# Patient Record
Sex: Female | Born: 1945 | Race: White | Hispanic: No | Smoking: Former smoker
Health system: Southern US, Community
[De-identification: ages and names within clinical notes are randomized; demographics above are authoritative.]

## PROBLEM LIST (undated history)

## (undated) DIAGNOSIS — I480 Paroxysmal atrial fibrillation: Secondary | ICD-10-CM

## (undated) DIAGNOSIS — E039 Hypothyroidism, unspecified: Secondary | ICD-10-CM

## (undated) DIAGNOSIS — F5104 Psychophysiologic insomnia: Secondary | ICD-10-CM

## (undated) DIAGNOSIS — J45909 Unspecified asthma, uncomplicated: Secondary | ICD-10-CM

## (undated) DIAGNOSIS — J189 Pneumonia, unspecified organism: Secondary | ICD-10-CM

## (undated) DIAGNOSIS — J449 Chronic obstructive pulmonary disease, unspecified: Secondary | ICD-10-CM

## (undated) DIAGNOSIS — S139XXA Sprain of joints and ligaments of unspecified parts of neck, initial encounter: Secondary | ICD-10-CM

## (undated) DIAGNOSIS — M51369 Other intervertebral disc degeneration, lumbar region without mention of lumbar back pain or lower extremity pain: Secondary | ICD-10-CM

## (undated) DIAGNOSIS — M5136 Other intervertebral disc degeneration, lumbar region: Secondary | ICD-10-CM

## (undated) DIAGNOSIS — R413 Other amnesia: Secondary | ICD-10-CM

## (undated) DIAGNOSIS — K219 Gastro-esophageal reflux disease without esophagitis: Secondary | ICD-10-CM

## (undated) DIAGNOSIS — F40240 Claustrophobia: Secondary | ICD-10-CM

## (undated) DIAGNOSIS — I2510 Atherosclerotic heart disease of native coronary artery without angina pectoris: Secondary | ICD-10-CM

## (undated) DIAGNOSIS — E119 Type 2 diabetes mellitus without complications: Secondary | ICD-10-CM

## (undated) DIAGNOSIS — M5126 Other intervertebral disc displacement, lumbar region: Secondary | ICD-10-CM

## (undated) DIAGNOSIS — R51 Headache: Secondary | ICD-10-CM

## (undated) DIAGNOSIS — G8929 Other chronic pain: Secondary | ICD-10-CM

## (undated) DIAGNOSIS — M316 Other giant cell arteritis: Secondary | ICD-10-CM

## (undated) DIAGNOSIS — I10 Essential (primary) hypertension: Secondary | ICD-10-CM

## (undated) DIAGNOSIS — K589 Irritable bowel syndrome without diarrhea: Secondary | ICD-10-CM

## (undated) DIAGNOSIS — H5462 Unqualified visual loss, left eye, normal vision right eye: Secondary | ICD-10-CM

## (undated) DIAGNOSIS — I214 Non-ST elevation (NSTEMI) myocardial infarction: Secondary | ICD-10-CM

## (undated) DIAGNOSIS — N182 Chronic kidney disease, stage 2 (mild): Secondary | ICD-10-CM

## (undated) DIAGNOSIS — R609 Edema, unspecified: Secondary | ICD-10-CM

## (undated) DIAGNOSIS — M1990 Unspecified osteoarthritis, unspecified site: Secondary | ICD-10-CM

## (undated) DIAGNOSIS — E785 Hyperlipidemia, unspecified: Secondary | ICD-10-CM

## (undated) HISTORY — PX: CHOLECYSTECTOMY: SHX55

## (undated) HISTORY — PX: ABDOMINAL HYSTERECTOMY: SHX81

## (undated) HISTORY — DX: Irritable bowel syndrome, unspecified: K58.9

## (undated) HISTORY — PX: APPENDECTOMY: SHX54

## (undated) HISTORY — DX: Unqualified visual loss, left eye, normal vision right eye: H54.62

## (undated) HISTORY — DX: Other intervertebral disc degeneration, lumbar region: M51.36

## (undated) HISTORY — DX: Non-ST elevation (NSTEMI) myocardial infarction: I21.4

## (undated) HISTORY — DX: Other amnesia: R41.3

## (undated) HISTORY — DX: Psychophysiologic insomnia: F51.04

## (undated) HISTORY — DX: Atherosclerotic heart disease of native coronary artery without angina pectoris: I25.10

## (undated) HISTORY — DX: Edema, unspecified: R60.9

## (undated) HISTORY — DX: Essential (primary) hypertension: I10

## (undated) HISTORY — DX: Chronic kidney disease, stage 2 (mild): N18.2

## (undated) HISTORY — DX: Type 2 diabetes mellitus without complications: E11.9

## (undated) HISTORY — PX: FOOT SURGERY: SHX648

## (undated) HISTORY — DX: Other giant cell arteritis: M31.6

## (undated) HISTORY — DX: Gastro-esophageal reflux disease without esophagitis: K21.9

## (undated) HISTORY — DX: Hypothyroidism, unspecified: E03.9

## (undated) HISTORY — DX: Hyperlipidemia, unspecified: E78.5

## (undated) HISTORY — DX: Headache: R51

## (undated) HISTORY — DX: Other intervertebral disc displacement, lumbar region: M51.26

## (undated) HISTORY — DX: Pneumonia, unspecified organism: J18.9

## (undated) HISTORY — DX: Other chronic pain: G89.29

## (undated) HISTORY — DX: Paroxysmal atrial fibrillation: I48.0

## (undated) HISTORY — DX: Other intervertebral disc degeneration, lumbar region without mention of lumbar back pain or lower extremity pain: M51.369

## (undated) HISTORY — DX: Sprain of joints and ligaments of unspecified parts of neck, initial encounter: S13.9XXA

---

## 2000-12-08 ENCOUNTER — Encounter: Payer: Self-pay | Admitting: Family Medicine

## 2000-12-08 ENCOUNTER — Ambulatory Visit (HOSPITAL_COMMUNITY): Admission: RE | Admit: 2000-12-08 | Discharge: 2000-12-08 | Payer: Self-pay | Admitting: Family Medicine

## 2001-05-03 ENCOUNTER — Encounter (HOSPITAL_COMMUNITY): Admission: RE | Admit: 2001-05-03 | Discharge: 2001-06-02 | Payer: Self-pay | Admitting: Oncology

## 2001-05-03 ENCOUNTER — Encounter: Admission: RE | Admit: 2001-05-03 | Discharge: 2001-05-03 | Payer: Self-pay | Admitting: Oncology

## 2001-05-28 ENCOUNTER — Ambulatory Visit (HOSPITAL_COMMUNITY): Admission: RE | Admit: 2001-05-28 | Discharge: 2001-05-28 | Payer: Self-pay | Admitting: Family Medicine

## 2001-05-28 ENCOUNTER — Encounter: Payer: Self-pay | Admitting: Family Medicine

## 2001-06-14 ENCOUNTER — Encounter: Admission: RE | Admit: 2001-06-14 | Discharge: 2001-06-14 | Payer: Self-pay | Admitting: Oncology

## 2001-06-14 ENCOUNTER — Encounter (HOSPITAL_COMMUNITY): Admission: RE | Admit: 2001-06-14 | Discharge: 2001-07-14 | Payer: Self-pay | Admitting: Oncology

## 2001-07-02 ENCOUNTER — Ambulatory Visit (HOSPITAL_COMMUNITY): Admission: RE | Admit: 2001-07-02 | Discharge: 2001-07-02 | Payer: Self-pay | Admitting: Family Medicine

## 2001-07-02 ENCOUNTER — Encounter: Payer: Self-pay | Admitting: Family Medicine

## 2001-07-29 ENCOUNTER — Encounter (HOSPITAL_COMMUNITY): Admission: RE | Admit: 2001-07-29 | Discharge: 2001-08-28 | Payer: Self-pay | Admitting: Oncology

## 2001-08-27 ENCOUNTER — Encounter (HOSPITAL_COMMUNITY): Admission: RE | Admit: 2001-08-27 | Discharge: 2001-09-26 | Payer: Self-pay | Admitting: Oncology

## 2001-09-17 ENCOUNTER — Encounter (INDEPENDENT_AMBULATORY_CARE_PROVIDER_SITE_OTHER): Payer: Self-pay | Admitting: Internal Medicine

## 2001-10-22 ENCOUNTER — Encounter (HOSPITAL_COMMUNITY): Admission: RE | Admit: 2001-10-22 | Discharge: 2001-11-21 | Payer: Self-pay | Admitting: Oncology

## 2001-12-17 ENCOUNTER — Encounter: Admission: RE | Admit: 2001-12-17 | Discharge: 2001-12-17 | Payer: Self-pay | Admitting: Oncology

## 2001-12-17 ENCOUNTER — Encounter (HOSPITAL_COMMUNITY): Admission: RE | Admit: 2001-12-17 | Discharge: 2002-01-16 | Payer: Self-pay | Admitting: Oncology

## 2002-02-07 ENCOUNTER — Encounter: Admission: RE | Admit: 2002-02-07 | Discharge: 2002-02-07 | Payer: Self-pay | Admitting: Oncology

## 2002-02-07 ENCOUNTER — Encounter (HOSPITAL_COMMUNITY): Admission: RE | Admit: 2002-02-07 | Discharge: 2002-03-09 | Payer: Self-pay | Admitting: Oncology

## 2002-03-11 ENCOUNTER — Encounter (HOSPITAL_COMMUNITY): Admission: RE | Admit: 2002-03-11 | Discharge: 2002-04-10 | Payer: Self-pay | Admitting: Oncology

## 2002-03-11 ENCOUNTER — Encounter: Admission: RE | Admit: 2002-03-11 | Discharge: 2002-03-11 | Payer: Self-pay | Admitting: Oncology

## 2002-06-12 ENCOUNTER — Encounter (HOSPITAL_COMMUNITY): Admission: RE | Admit: 2002-06-12 | Discharge: 2002-07-12 | Payer: Self-pay | Admitting: Oncology

## 2002-06-12 ENCOUNTER — Encounter: Admission: RE | Admit: 2002-06-12 | Discharge: 2002-06-12 | Payer: Self-pay | Admitting: Oncology

## 2002-07-16 ENCOUNTER — Encounter: Payer: Self-pay | Admitting: Family Medicine

## 2002-07-16 ENCOUNTER — Ambulatory Visit (HOSPITAL_COMMUNITY): Admission: RE | Admit: 2002-07-16 | Discharge: 2002-07-16 | Payer: Self-pay | Admitting: Family Medicine

## 2002-08-07 ENCOUNTER — Encounter (HOSPITAL_COMMUNITY): Admission: RE | Admit: 2002-08-07 | Discharge: 2002-09-06 | Payer: Self-pay | Admitting: Oncology

## 2002-08-07 ENCOUNTER — Encounter: Admission: RE | Admit: 2002-08-07 | Discharge: 2002-08-07 | Payer: Self-pay | Admitting: Oncology

## 2002-09-02 ENCOUNTER — Encounter (INDEPENDENT_AMBULATORY_CARE_PROVIDER_SITE_OTHER): Payer: Self-pay | Admitting: Internal Medicine

## 2002-09-02 ENCOUNTER — Ambulatory Visit (HOSPITAL_COMMUNITY): Admission: RE | Admit: 2002-09-02 | Discharge: 2002-09-02 | Payer: Self-pay | Admitting: Internal Medicine

## 2002-10-01 ENCOUNTER — Encounter (HOSPITAL_COMMUNITY): Admission: RE | Admit: 2002-10-01 | Discharge: 2002-10-31 | Payer: Self-pay | Admitting: Oncology

## 2002-10-01 ENCOUNTER — Encounter: Admission: RE | Admit: 2002-10-01 | Discharge: 2002-10-01 | Payer: Self-pay | Admitting: Oncology

## 2002-11-24 ENCOUNTER — Encounter: Payer: Self-pay | Admitting: Family Medicine

## 2002-11-24 ENCOUNTER — Ambulatory Visit (HOSPITAL_COMMUNITY): Admission: RE | Admit: 2002-11-24 | Discharge: 2002-11-24 | Payer: Self-pay | Admitting: Family Medicine

## 2002-12-18 ENCOUNTER — Encounter: Admission: RE | Admit: 2002-12-18 | Discharge: 2002-12-18 | Payer: Self-pay | Admitting: Oncology

## 2002-12-18 ENCOUNTER — Encounter (HOSPITAL_COMMUNITY): Admission: RE | Admit: 2002-12-18 | Discharge: 2003-01-17 | Payer: Self-pay | Admitting: Oncology

## 2003-03-11 ENCOUNTER — Encounter: Admission: RE | Admit: 2003-03-11 | Discharge: 2003-03-11 | Payer: Self-pay | Admitting: Oncology

## 2003-03-11 ENCOUNTER — Encounter (HOSPITAL_COMMUNITY): Admission: RE | Admit: 2003-03-11 | Discharge: 2003-04-10 | Payer: Self-pay | Admitting: Oncology

## 2003-04-23 ENCOUNTER — Encounter: Admission: RE | Admit: 2003-04-23 | Discharge: 2003-04-23 | Payer: Self-pay | Admitting: Orthopedic Surgery

## 2003-04-23 ENCOUNTER — Encounter: Payer: Self-pay | Admitting: Orthopedic Surgery

## 2003-06-24 ENCOUNTER — Encounter (HOSPITAL_COMMUNITY): Admission: RE | Admit: 2003-06-24 | Discharge: 2003-07-24 | Payer: Self-pay | Admitting: Oncology

## 2003-06-24 ENCOUNTER — Encounter: Admission: RE | Admit: 2003-06-24 | Discharge: 2003-06-24 | Payer: Self-pay | Admitting: Oncology

## 2003-09-18 ENCOUNTER — Ambulatory Visit (HOSPITAL_COMMUNITY): Admission: RE | Admit: 2003-09-18 | Discharge: 2003-09-18 | Payer: Self-pay | Admitting: Family Medicine

## 2003-10-09 ENCOUNTER — Ambulatory Visit (HOSPITAL_COMMUNITY): Admission: RE | Admit: 2003-10-09 | Discharge: 2003-10-09 | Payer: Self-pay | Admitting: Internal Medicine

## 2004-01-13 ENCOUNTER — Encounter (HOSPITAL_COMMUNITY): Admission: RE | Admit: 2004-01-13 | Discharge: 2004-02-12 | Payer: Self-pay | Admitting: Oncology

## 2004-01-13 ENCOUNTER — Encounter: Admission: RE | Admit: 2004-01-13 | Discharge: 2004-01-13 | Payer: Self-pay | Admitting: Oncology

## 2004-04-01 ENCOUNTER — Encounter: Admission: RE | Admit: 2004-04-01 | Discharge: 2004-04-22 | Payer: Self-pay | Admitting: Oncology

## 2004-04-01 ENCOUNTER — Encounter (HOSPITAL_COMMUNITY): Admission: RE | Admit: 2004-04-01 | Discharge: 2004-04-22 | Payer: Self-pay | Admitting: Oncology

## 2004-04-16 ENCOUNTER — Emergency Department (HOSPITAL_COMMUNITY): Admission: EM | Admit: 2004-04-16 | Discharge: 2004-04-16 | Payer: Self-pay | Admitting: Emergency Medicine

## 2004-04-29 ENCOUNTER — Emergency Department (HOSPITAL_COMMUNITY): Admission: EM | Admit: 2004-04-29 | Discharge: 2004-04-29 | Payer: Self-pay | Admitting: Emergency Medicine

## 2004-05-02 ENCOUNTER — Ambulatory Visit (HOSPITAL_COMMUNITY): Admission: RE | Admit: 2004-05-02 | Discharge: 2004-05-02 | Payer: Self-pay | Admitting: Family Medicine

## 2004-05-10 ENCOUNTER — Encounter: Admission: RE | Admit: 2004-05-10 | Discharge: 2004-05-10 | Payer: Self-pay | Admitting: Oncology

## 2004-06-24 ENCOUNTER — Ambulatory Visit (HOSPITAL_COMMUNITY): Admission: RE | Admit: 2004-06-24 | Discharge: 2004-06-24 | Payer: Self-pay | Admitting: Family Medicine

## 2004-07-19 ENCOUNTER — Encounter (HOSPITAL_COMMUNITY): Admission: RE | Admit: 2004-07-19 | Discharge: 2004-08-18 | Payer: Self-pay | Admitting: Oncology

## 2004-07-19 ENCOUNTER — Encounter: Admission: RE | Admit: 2004-07-19 | Discharge: 2004-07-19 | Payer: Self-pay | Admitting: Oncology

## 2004-07-26 ENCOUNTER — Ambulatory Visit (HOSPITAL_COMMUNITY): Payer: Self-pay | Admitting: Oncology

## 2004-09-13 ENCOUNTER — Encounter: Admission: RE | Admit: 2004-09-13 | Discharge: 2004-09-13 | Payer: Self-pay | Admitting: Oncology

## 2004-09-13 ENCOUNTER — Encounter (HOSPITAL_COMMUNITY): Admission: RE | Admit: 2004-09-13 | Discharge: 2004-10-13 | Payer: Self-pay | Admitting: Oncology

## 2004-10-03 ENCOUNTER — Ambulatory Visit (HOSPITAL_COMMUNITY): Admission: RE | Admit: 2004-10-03 | Discharge: 2004-10-03 | Payer: Self-pay | Admitting: Family Medicine

## 2004-10-07 ENCOUNTER — Ambulatory Visit (HOSPITAL_COMMUNITY): Admission: RE | Admit: 2004-10-07 | Discharge: 2004-10-07 | Payer: Self-pay | Admitting: Family Medicine

## 2004-10-25 ENCOUNTER — Ambulatory Visit (HOSPITAL_COMMUNITY): Admission: RE | Admit: 2004-10-25 | Discharge: 2004-10-25 | Payer: Self-pay | Admitting: Pulmonary Disease

## 2004-11-08 ENCOUNTER — Ambulatory Visit (HOSPITAL_COMMUNITY): Payer: Self-pay | Admitting: Oncology

## 2004-11-08 ENCOUNTER — Encounter: Admission: RE | Admit: 2004-11-08 | Discharge: 2004-11-08 | Payer: Self-pay | Admitting: Oncology

## 2004-11-08 ENCOUNTER — Encounter (HOSPITAL_COMMUNITY): Admission: RE | Admit: 2004-11-08 | Discharge: 2004-12-08 | Payer: Self-pay | Admitting: Oncology

## 2004-11-15 ENCOUNTER — Ambulatory Visit (HOSPITAL_COMMUNITY): Admission: RE | Admit: 2004-11-15 | Discharge: 2004-11-15 | Payer: Self-pay | Admitting: Pulmonary Disease

## 2004-12-14 ENCOUNTER — Ambulatory Visit: Payer: Self-pay | Admitting: Cardiology

## 2004-12-14 ENCOUNTER — Ambulatory Visit (HOSPITAL_COMMUNITY): Admission: RE | Admit: 2004-12-14 | Discharge: 2004-12-14 | Payer: Self-pay | Admitting: Pulmonary Disease

## 2004-12-26 ENCOUNTER — Encounter: Admission: RE | Admit: 2004-12-26 | Discharge: 2004-12-26 | Payer: Self-pay | Admitting: Pulmonary Disease

## 2005-02-15 ENCOUNTER — Encounter: Admission: RE | Admit: 2005-02-15 | Discharge: 2005-02-15 | Payer: Self-pay | Admitting: Oncology

## 2005-02-15 ENCOUNTER — Ambulatory Visit (HOSPITAL_COMMUNITY): Payer: Self-pay | Admitting: Oncology

## 2005-02-15 ENCOUNTER — Encounter (HOSPITAL_COMMUNITY): Admission: RE | Admit: 2005-02-15 | Discharge: 2005-03-17 | Payer: Self-pay | Admitting: Oncology

## 2005-04-04 ENCOUNTER — Ambulatory Visit: Payer: Self-pay | Admitting: Internal Medicine

## 2005-04-07 ENCOUNTER — Ambulatory Visit (HOSPITAL_COMMUNITY): Admission: RE | Admit: 2005-04-07 | Discharge: 2005-04-07 | Payer: Self-pay | Admitting: Internal Medicine

## 2005-05-23 ENCOUNTER — Ambulatory Visit (HOSPITAL_COMMUNITY): Admission: RE | Admit: 2005-05-23 | Discharge: 2005-05-23 | Payer: Self-pay | Admitting: Unknown Physician Specialty

## 2005-05-23 ENCOUNTER — Ambulatory Visit: Payer: Self-pay | Admitting: Internal Medicine

## 2005-06-21 ENCOUNTER — Encounter: Admission: RE | Admit: 2005-06-21 | Discharge: 2005-06-21 | Payer: Self-pay | Admitting: Pulmonary Disease

## 2005-10-27 ENCOUNTER — Encounter: Admission: RE | Admit: 2005-10-27 | Discharge: 2005-10-27 | Payer: Self-pay | Admitting: Family Medicine

## 2005-11-15 ENCOUNTER — Encounter
Admission: RE | Admit: 2005-11-15 | Discharge: 2005-11-15 | Payer: Self-pay | Admitting: Physical Medicine and Rehabilitation

## 2005-12-06 ENCOUNTER — Ambulatory Visit: Payer: Self-pay | Admitting: Internal Medicine

## 2005-12-14 ENCOUNTER — Ambulatory Visit (HOSPITAL_COMMUNITY): Admission: RE | Admit: 2005-12-14 | Discharge: 2005-12-14 | Payer: Self-pay | Admitting: Internal Medicine

## 2005-12-14 ENCOUNTER — Ambulatory Visit: Payer: Self-pay | Admitting: Internal Medicine

## 2006-01-09 ENCOUNTER — Ambulatory Visit: Payer: Self-pay | Admitting: Internal Medicine

## 2006-01-11 ENCOUNTER — Ambulatory Visit (HOSPITAL_COMMUNITY): Admission: RE | Admit: 2006-01-11 | Discharge: 2006-01-11 | Payer: Self-pay | Admitting: Internal Medicine

## 2006-03-13 ENCOUNTER — Ambulatory Visit: Payer: Self-pay | Admitting: Internal Medicine

## 2006-05-17 ENCOUNTER — Ambulatory Visit: Payer: Self-pay | Admitting: Internal Medicine

## 2006-05-18 ENCOUNTER — Encounter: Admission: RE | Admit: 2006-05-18 | Discharge: 2006-05-18 | Payer: Self-pay | Admitting: Neurology

## 2006-05-31 ENCOUNTER — Encounter: Admission: RE | Admit: 2006-05-31 | Discharge: 2006-05-31 | Payer: Self-pay | Admitting: Pulmonary Disease

## 2006-07-23 ENCOUNTER — Ambulatory Visit (HOSPITAL_COMMUNITY): Admission: RE | Admit: 2006-07-23 | Discharge: 2006-07-23 | Payer: Self-pay | Admitting: Family Medicine

## 2006-08-10 ENCOUNTER — Ambulatory Visit: Payer: Self-pay | Admitting: Internal Medicine

## 2006-08-10 ENCOUNTER — Ambulatory Visit (HOSPITAL_COMMUNITY): Admission: RE | Admit: 2006-08-10 | Discharge: 2006-08-10 | Payer: Self-pay | Admitting: Family Medicine

## 2006-09-06 ENCOUNTER — Ambulatory Visit: Payer: Self-pay | Admitting: Internal Medicine

## 2006-12-06 ENCOUNTER — Ambulatory Visit: Payer: Self-pay | Admitting: Internal Medicine

## 2007-03-07 ENCOUNTER — Encounter
Admission: RE | Admit: 2007-03-07 | Discharge: 2007-03-07 | Payer: Self-pay | Admitting: Physical Medicine and Rehabilitation

## 2007-03-12 ENCOUNTER — Ambulatory Visit: Payer: Self-pay | Admitting: Cardiovascular Disease

## 2007-03-13 ENCOUNTER — Ambulatory Visit: Payer: Self-pay | Admitting: Cardiovascular Disease

## 2007-03-13 ENCOUNTER — Ambulatory Visit (HOSPITAL_COMMUNITY): Admission: RE | Admit: 2007-03-13 | Discharge: 2007-03-13 | Payer: Self-pay | Admitting: Cardiovascular Disease

## 2007-03-26 ENCOUNTER — Ambulatory Visit: Payer: Self-pay | Admitting: Cardiovascular Disease

## 2007-04-04 ENCOUNTER — Ambulatory Visit: Payer: Self-pay | Admitting: Cardiovascular Disease

## 2007-05-09 ENCOUNTER — Ambulatory Visit: Payer: Self-pay | Admitting: Cardiovascular Disease

## 2007-05-09 ENCOUNTER — Ambulatory Visit (HOSPITAL_COMMUNITY): Admission: RE | Admit: 2007-05-09 | Discharge: 2007-05-09 | Payer: Self-pay | Admitting: Cardiovascular Disease

## 2007-05-16 ENCOUNTER — Ambulatory Visit: Payer: Self-pay | Admitting: Cardiovascular Disease

## 2007-05-16 ENCOUNTER — Ambulatory Visit (HOSPITAL_COMMUNITY): Admission: RE | Admit: 2007-05-16 | Discharge: 2007-05-16 | Payer: Self-pay | Admitting: Cardiovascular Disease

## 2007-07-03 ENCOUNTER — Ambulatory Visit: Payer: Self-pay | Admitting: Cardiovascular Disease

## 2007-08-01 ENCOUNTER — Ambulatory Visit (HOSPITAL_COMMUNITY): Admission: RE | Admit: 2007-08-01 | Discharge: 2007-08-01 | Payer: Self-pay | Admitting: Family Medicine

## 2007-09-30 ENCOUNTER — Ambulatory Visit (HOSPITAL_COMMUNITY): Admission: RE | Admit: 2007-09-30 | Discharge: 2007-09-30 | Payer: Self-pay | Admitting: Family Medicine

## 2007-12-19 ENCOUNTER — Emergency Department (HOSPITAL_COMMUNITY): Admission: EM | Admit: 2007-12-19 | Discharge: 2007-12-19 | Payer: Self-pay | Admitting: Emergency Medicine

## 2007-12-20 ENCOUNTER — Inpatient Hospital Stay (HOSPITAL_COMMUNITY): Admission: AD | Admit: 2007-12-20 | Discharge: 2007-12-22 | Payer: Self-pay | Admitting: Family Medicine

## 2008-01-02 ENCOUNTER — Ambulatory Visit: Payer: Self-pay | Admitting: Cardiovascular Disease

## 2008-04-22 ENCOUNTER — Ambulatory Visit (HOSPITAL_COMMUNITY): Admission: RE | Admit: 2008-04-22 | Discharge: 2008-04-22 | Payer: Self-pay | Admitting: Pulmonary Disease

## 2008-06-30 ENCOUNTER — Ambulatory Visit (HOSPITAL_COMMUNITY): Admission: RE | Admit: 2008-06-30 | Discharge: 2008-06-30 | Payer: Self-pay | Admitting: Family Medicine

## 2008-09-08 ENCOUNTER — Ambulatory Visit: Payer: Self-pay | Admitting: Cardiology

## 2009-02-09 ENCOUNTER — Ambulatory Visit (HOSPITAL_COMMUNITY): Admission: RE | Admit: 2009-02-09 | Discharge: 2009-02-09 | Payer: Self-pay | Admitting: Internal Medicine

## 2009-04-29 ENCOUNTER — Ambulatory Visit: Payer: Self-pay | Admitting: Cardiovascular Disease

## 2009-04-29 ENCOUNTER — Inpatient Hospital Stay (HOSPITAL_COMMUNITY): Admission: AD | Admit: 2009-04-29 | Discharge: 2009-05-04 | Payer: Self-pay | Admitting: Family Medicine

## 2009-04-29 ENCOUNTER — Ambulatory Visit (HOSPITAL_COMMUNITY): Admission: RE | Admit: 2009-04-29 | Discharge: 2009-04-29 | Payer: Self-pay | Admitting: Family Medicine

## 2009-05-10 ENCOUNTER — Ambulatory Visit (HOSPITAL_COMMUNITY): Admission: RE | Admit: 2009-05-10 | Discharge: 2009-05-10 | Payer: Self-pay | Admitting: Family Medicine

## 2009-08-06 ENCOUNTER — Ambulatory Visit (HOSPITAL_COMMUNITY): Admission: RE | Admit: 2009-08-06 | Discharge: 2009-08-06 | Payer: Self-pay | Admitting: Family Medicine

## 2010-06-08 ENCOUNTER — Ambulatory Visit (HOSPITAL_COMMUNITY): Admission: RE | Admit: 2010-06-08 | Discharge: 2010-06-08 | Payer: Self-pay | Admitting: Family Medicine

## 2010-06-14 ENCOUNTER — Ambulatory Visit (HOSPITAL_COMMUNITY)
Admission: RE | Admit: 2010-06-14 | Discharge: 2010-06-14 | Payer: Self-pay | Source: Home / Self Care | Admitting: Family Medicine

## 2010-08-13 ENCOUNTER — Encounter: Payer: Self-pay | Admitting: Family Medicine

## 2010-08-13 ENCOUNTER — Encounter: Payer: Self-pay | Admitting: Internal Medicine

## 2010-08-14 ENCOUNTER — Encounter: Payer: Self-pay | Admitting: Family Medicine

## 2010-08-15 ENCOUNTER — Ambulatory Visit (HOSPITAL_COMMUNITY)
Admission: RE | Admit: 2010-08-15 | Discharge: 2010-08-15 | Payer: Self-pay | Source: Home / Self Care | Attending: Family Medicine | Admitting: Family Medicine

## 2010-10-08 ENCOUNTER — Emergency Department (HOSPITAL_COMMUNITY): Payer: Medicaid Other

## 2010-10-08 ENCOUNTER — Emergency Department (HOSPITAL_COMMUNITY)
Admission: EM | Admit: 2010-10-08 | Discharge: 2010-10-08 | Disposition: A | Discharge status: Home / Self Care | Payer: Medicaid Other | Attending: Emergency Medicine | Admitting: Emergency Medicine

## 2010-10-08 DIAGNOSIS — S0083XA Contusion of other part of head, initial encounter: Secondary | ICD-10-CM | POA: Insufficient documentation

## 2010-10-08 DIAGNOSIS — J4489 Other specified chronic obstructive pulmonary disease: Secondary | ICD-10-CM | POA: Insufficient documentation

## 2010-10-08 DIAGNOSIS — Y92009 Unspecified place in unspecified non-institutional (private) residence as the place of occurrence of the external cause: Secondary | ICD-10-CM | POA: Insufficient documentation

## 2010-10-08 DIAGNOSIS — IMO0002 Reserved for concepts with insufficient information to code with codable children: Secondary | ICD-10-CM | POA: Insufficient documentation

## 2010-10-08 DIAGNOSIS — S0003XA Contusion of scalp, initial encounter: Secondary | ICD-10-CM | POA: Insufficient documentation

## 2010-10-08 DIAGNOSIS — I10 Essential (primary) hypertension: Secondary | ICD-10-CM | POA: Insufficient documentation

## 2010-10-08 DIAGNOSIS — I509 Heart failure, unspecified: Secondary | ICD-10-CM | POA: Insufficient documentation

## 2010-10-08 DIAGNOSIS — W1809XA Striking against other object with subsequent fall, initial encounter: Secondary | ICD-10-CM | POA: Insufficient documentation

## 2010-10-08 DIAGNOSIS — S022XXA Fracture of nasal bones, initial encounter for closed fracture: Secondary | ICD-10-CM | POA: Insufficient documentation

## 2010-10-08 DIAGNOSIS — M25569 Pain in unspecified knee: Secondary | ICD-10-CM | POA: Insufficient documentation

## 2010-10-08 DIAGNOSIS — Z79899 Other long term (current) drug therapy: Secondary | ICD-10-CM | POA: Insufficient documentation

## 2010-10-08 DIAGNOSIS — E119 Type 2 diabetes mellitus without complications: Secondary | ICD-10-CM | POA: Insufficient documentation

## 2010-10-08 DIAGNOSIS — J449 Chronic obstructive pulmonary disease, unspecified: Secondary | ICD-10-CM | POA: Insufficient documentation

## 2010-10-08 DIAGNOSIS — J3489 Other specified disorders of nose and nasal sinuses: Secondary | ICD-10-CM | POA: Insufficient documentation

## 2010-10-08 DIAGNOSIS — S8000XA Contusion of unspecified knee, initial encounter: Secondary | ICD-10-CM | POA: Insufficient documentation

## 2010-10-08 DIAGNOSIS — R51 Headache: Secondary | ICD-10-CM | POA: Insufficient documentation

## 2010-10-08 DIAGNOSIS — E039 Hypothyroidism, unspecified: Secondary | ICD-10-CM | POA: Insufficient documentation

## 2010-10-08 DIAGNOSIS — K219 Gastro-esophageal reflux disease without esophagitis: Secondary | ICD-10-CM | POA: Insufficient documentation

## 2010-10-27 LAB — CBC
Hemoglobin: 11 g/dL — ABNORMAL LOW (ref 12.0–15.0)
MCHC: 33.6 g/dL (ref 30.0–36.0)
Platelets: 156 10*3/uL (ref 150–400)
RBC: 3.1 MIL/uL — ABNORMAL LOW (ref 3.87–5.11)
RBC: 3.53 MIL/uL — ABNORMAL LOW (ref 3.87–5.11)
WBC: 6 10*3/uL (ref 4.0–10.5)
WBC: 9.6 10*3/uL (ref 4.0–10.5)

## 2010-10-27 LAB — URINALYSIS, MICROSCOPIC ONLY
Bilirubin Urine: NEGATIVE
Ketones, ur: NEGATIVE mg/dL
Nitrite: NEGATIVE
Protein, ur: NEGATIVE mg/dL
Specific Gravity, Urine: <1.005 — ABNORMAL LOW (ref 1.005–1.030)
Urobilinogen, UA: 0.2 mg/dL (ref 0.0–1.0)

## 2010-10-27 LAB — GLUCOSE, CAPILLARY
Glucose-Capillary: 122 mg/dL — ABNORMAL HIGH (ref 70–99)
Glucose-Capillary: 126 mg/dL — ABNORMAL HIGH (ref 70–99)
Glucose-Capillary: 126 mg/dL — ABNORMAL HIGH (ref 70–99)
Glucose-Capillary: 137 mg/dL — ABNORMAL HIGH (ref 70–99)
Glucose-Capillary: 142 mg/dL — ABNORMAL HIGH (ref 70–99)
Glucose-Capillary: 146 mg/dL — ABNORMAL HIGH (ref 70–99)
Glucose-Capillary: 163 mg/dL — ABNORMAL HIGH (ref 70–99)
Glucose-Capillary: 185 mg/dL — ABNORMAL HIGH (ref 70–99)
Glucose-Capillary: 186 mg/dL — ABNORMAL HIGH (ref 70–99)

## 2010-10-27 LAB — BLOOD GAS, ARTERIAL
Bicarbonate: 32.8 mEq/L — ABNORMAL HIGH (ref 20.0–24.0)
Bicarbonate: 33.8 mEq/L — ABNORMAL HIGH (ref 20.0–24.0)
FIO2: 0.21 %
O2 Content: 3 L/min
O2 Saturation: 78 %
Patient temperature: 37
TCO2: 30.7 mmol/L (ref 0–100)
pH, Arterial: 7.357 (ref 7.350–7.400)
pH, Arterial: 7.445 — ABNORMAL HIGH (ref 7.350–7.400)

## 2010-10-27 LAB — DIFFERENTIAL
Basophils Absolute: 0 10*3/uL (ref 0.0–0.1)
Eosinophils Absolute: 0 10*3/uL (ref 0.0–0.7)
Eosinophils Relative: 0 % (ref 0–5)
Eosinophils Relative: 3 % (ref 0–5)
Lymphocytes Relative: 11 % — ABNORMAL LOW (ref 12–46)
Lymphocytes Relative: 30 % (ref 12–46)
Lymphs Abs: 1.1 10*3/uL (ref 0.7–4.0)
Monocytes Absolute: 0.5 10*3/uL (ref 0.1–1.0)

## 2010-10-27 LAB — URINE CULTURE
Colony Count: NO GROWTH
Culture: NO GROWTH
Special Requests: NEGATIVE

## 2010-10-27 LAB — BASIC METABOLIC PANEL
BUN: 10 mg/dL (ref 6–23)
CO2: 34 mEq/L — ABNORMAL HIGH (ref 19–32)
Chloride: 100 mEq/L (ref 96–112)
GFR calc non Af Amer: >60 mL/min (ref >60)
Glucose, Bld: 152 mg/dL — ABNORMAL HIGH (ref 70–99)
Potassium: 4 mEq/L (ref 3.5–5.1)
Sodium: 141 mEq/L (ref 135–145)

## 2010-10-27 LAB — COMPREHENSIVE METABOLIC PANEL
ALT: 18 U/L (ref 0–35)
AST: 14 U/L (ref 0–37)
Albumin: 3 g/dL — ABNORMAL LOW (ref 3.5–5.2)
CO2: 33 mEq/L — ABNORMAL HIGH (ref 19–32)
Calcium: 9 mg/dL (ref 8.4–10.5)
Chloride: 98 mEq/L (ref 96–112)
GFR calc Af Amer: >60 mL/min (ref >60)
GFR calc non Af Amer: >60 mL/min (ref >60)
Sodium: 140 mEq/L (ref 135–145)

## 2010-10-27 LAB — IRON AND TIBC: Iron: 61 ug/dL (ref 42–135)

## 2010-10-27 LAB — BRAIN NATRIURETIC PEPTIDE: Pro B Natriuretic peptide (BNP): <30 pg/mL (ref 0.0–100.0)

## 2010-11-30 ENCOUNTER — Ambulatory Visit (HOSPITAL_COMMUNITY)
Admission: RE | Admit: 2010-11-30 | Discharge: 2010-11-30 | Disposition: A | Discharge status: Home / Self Care | Payer: Medicaid Other | Source: Ambulatory Visit | Attending: Pulmonary Disease | Admitting: Pulmonary Disease

## 2010-11-30 DIAGNOSIS — E119 Type 2 diabetes mellitus without complications: Secondary | ICD-10-CM | POA: Insufficient documentation

## 2010-11-30 DIAGNOSIS — I517 Cardiomegaly: Secondary | ICD-10-CM

## 2010-11-30 DIAGNOSIS — R0989 Other specified symptoms and signs involving the circulatory and respiratory systems: Secondary | ICD-10-CM | POA: Insufficient documentation

## 2010-11-30 DIAGNOSIS — R0609 Other forms of dyspnea: Secondary | ICD-10-CM | POA: Insufficient documentation

## 2010-11-30 DIAGNOSIS — J4489 Other specified chronic obstructive pulmonary disease: Secondary | ICD-10-CM | POA: Insufficient documentation

## 2010-11-30 DIAGNOSIS — J449 Chronic obstructive pulmonary disease, unspecified: Secondary | ICD-10-CM | POA: Insufficient documentation

## 2010-12-06 NOTE — Group Therapy Note (Signed)
NAMEKAMARRI, FISCHETTI              ACCOUNT NO.:  [id number]   MEDICAL RECORD NO.:  [id number]           PATIENT TYPE:  INP   LOCATION:  A318                          FACILITY:  [facility]   PHYSICIAN:  [name], MD   DATE OF BIRTH:  07-23-1946   DATE OF PROCEDURE:  DATE OF DISCHARGE:  12/22/2007                                 PROGRESS NOTE   This patient was admitted to hospital with atrial fibrillation and rapid  ventricular rate.  She is on Cardizem drip and her rate did come down  some and she was transitioned to oral Cardizem, now is in sinus rhythm.  She did have a reaction to Duragesic patch prior to admission, which  caused some discomfort.  She has improved, is in sinus rhythm.   OBJECTIVE:  VITAL SIGNS:  Blood pressure 102/51, respirations 20, pulse  77, temperature 98.2, and blood sugars range from 166-216.  Chemistries  show a slightly low serum potassium of 3.4.  HEART:  Regular rhythm.  LUNGS:  Diminished breath sounds bilaterally.  ABDOMEN:  No palpable  organs or masses.   ASSESSMENT:  The patient admitted with rapid atrial fibrillation.  She  is now in sinus rhythm and is feeling some better.   PLAN:  To continue current regimen.      [name], MD  Electronically Signed     AGM/MEDQ  D:  12/22/2007  T:  12/22/2007  Job:  [id number]

## 2010-12-06 NOTE — Discharge Summary (Signed)
NAMEMONE, COMMISSO             ACCOUNT NO.:  [id number]   MEDICAL RECORD NO.:  [id number]          PATIENT TYPE:  INP   LOCATION:  A318                          FACILITY:  [facility]   PHYSICIAN:  [name], MD   DATE OF BIRTH:  01/28/1946   DATE OF ADMISSION:  12/20/2007  DATE OF DISCHARGE:  05/31/2009LH                               DISCHARGE SUMMARY   ADDENDUM   LABORATORY DATA:  Sodium 140, potassium 3.4, chloride 101, CO2 of 29,  glucose 201, BUN 8, creatinine 0.58.  Liver enzymes; total protein 6.4,  albumin 3.7, SGOT 17, SGPT 12, alkaline phosphatase 29, bilirubin 0.6,  CK on admission 21, CK-MB 1, troponin 0.02.  Subsequent cardiac markers  normal.  TSH 0.366, free T4 of 1.44.      [name], MD  Electronically Signed     AGM/MEDQ  D:  12/30/2007  T:  12/30/2007  Job:  [id number]

## 2010-12-06 NOTE — Assessment & Plan Note (Signed)
[facility] HEALTHCARE                       [place] CARDIOLOGY OFFICE NOTE   Kara [name], Kara [name]                     MRN:          [id number]  DATE:03/12/2007                            DOB:          1945/11/12    Kara [name] is seen today as a new patient per Dr. Renard Matter' request.  She  is being seen for palpitations, question atrial fibrillation, and chest  pain.   Unfortunately, the patient has been fairly sick.  She has been on home  oxygen at 3 L for 6-8 years.  She has significant COPD and emphysema.  I  do not have any background on her ABGs and PFTs.  The patient wears her  O2 all the time.   She has had atypical chest pain for about a year.  It is central in her  chest.  It can occur with or without exertion.  It can be fleeting.  It  can happen once to twice per week and then not recur for 2-3 weeks.  There is no associated diaphoresis or shortness of breath.  There is no  PND or orthopnea.   The patient has chronic exertional dyspnea from her lung disease.  There  has been no frequent coughing or sputum production.  The patient has  also had some flip-flops.  I reviewed a monitor and EKG from Dr.  Renard Matter' office which showed sinus rhythm with PACs.  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  There is no  indication currently for anticoagulation.  The patient was started on  Lopressor 25 b.i.d. in addition to her Cardizem.   She seems to be tolerating this well.  There was been no change in her  chest pain pattern over the last year.   In talking to the patient, she apparently was turned down at [facility] for a  lung/heart transplant many years ago.  She was told never to have  general anesthesia or surgery, that she would require prolonged  ventilation.  Other than her palpitations which sound benign and her  chest pain which sounds atypical she has not had any acute problems.  Palpitations tend to occur more at  night.  They are not necessarily  sustained for more than 20 minutes.  They do not cause an increase in  her shortness of breath and they are not related to her chest pain.  These have been increasing in frequency over the last 2 months and as  indicated, there was a question of atrial arrhythmia at Dr. Renard Matter'  office.   The patient's review of systems otherwise remarkable for significant  arthritis in her hips and knees.   Her past medical history includes diabetes, reflux on Protonix, previous  gallbladder and hysterectomy surgery, hypothyroidism,  hypercholesterolemia, lower extremity edema, chronic pain syndrome with  Duragesic patch.   The patient denies any allergies.   Family history is noncontributory.   Medications include:  1. Nortriptyline 25 b.i.d.  2. Metformin 500 t.i.d.  3. Protonix 40 b.i.d.  4. Ambien 10 q.h.s.  5. Synthroid 100 mcg daily.  6. Celexa 20  b.i.d.  7. Simvastatin 80 a day.  8. Spironolactone 50 a day.  9. TriCor 145 a day.  10.Demodex 20 b.i.d.  11.Cardizem 120 a day.  12.Duragesic patch 75 mcg every 3 days.  13.Gabapentin 300 t.i.d.  14.Lopressor 25 b.i.d. which is new.   The patient is happily married.  Her husband is with her today.  She is  on disability.  She does not do much due to her home O2 requirement.  She does not drink and quit smoking 6-8 years ago when she started her  O2.   Exam is remarkable for an overweight white female with oxygen on.  Her  weight is 195, resting saturations are 94%, blood pressure is 120/68,  pulse 73 and regular.  Affect is appropriate.  She is afebrile.  HEENT  is normal.  Carotids without bruit.  There is no lymphadenopathy, no JVP  elevation, no thyromegaly.  Lungs have decreased breath sounds in the  mid fields, no active wheezing.  Diaphragmatic motion is somewhat  decreased.  There is an S1, S2 with distant heart sounds.  PMI is not  palpable.  Abdomen is protuberant.  She is status post  hysterectomy and  cholecystectomy.  Bowel sounds are positive.  She has a small ventral  hernia.  There is no tenderness, no hepatosplenomegaly, no hepatojugular  reflux.  Femorals are +3 bilaterally without bruit.  She has +1 edema  bilaterally.  Neurologic is intact.  There is no muscular weakness.  Skin is warm and dry.  There are no focal neurological signs.   Her EKG shows sinus rhythm and is abnormal with T-wave inversions in V1  through V6.   IMPRESSION:  1. Diabetic with severe chronic obstructive pulmonary disease, chest      pain although atypical and an abnormal EKG.  She clearly would not      be an easy person to stress.  She has flat-out refused to have      chemical stress tests before.  She says that God does not want her      to have that sort of test.  I talked to her at length about the      possibility of a heart catheterization.  The risks including      stroke, dye reaction, bleeding were discussed.  She will think      about it but she is more apt to have a catheterization than she      would a chemical stress test.  Beta blockers have been added to her      regimen.  I encouraged her to take a baby aspirin a day.  She will      let me know if she wants to proceed with this.  2. In regards to her rhythm, she does not have atrial fibrillation.  I      suspect she may have had a burst of multifocal atrial tachycardia      secondary to hypoxemia and her lung disease.  She is currently on      Cardizem and metoprolol.  If her symptoms worsen on this I would      switch her over to verapamil which has been shown to be somewhat      better for MAT.  3. Lower extremity edema, currently stable.  Continue current dose of      spironolactone and Demadex.  Low-salt diet.  May be some component      of cor pulmonale.  4.  Diabetes.  Continue metformin 500 t.i.d.  Hemoglobin A1c per Dr.      Renard Matter.  5. Hypothyroidism.  Synthroid 100 mcg per day.  Check TSH and T4 in 6       months.  6. Hypercholesterolemia, particularly in the setting of possible      coronary disease.  Continue simvastatin 80 a day.  Check lipid and      liver profile in 6 months.  7. Chronic pain syndrome.  This may make her chest pain syndrome tough      to diagnose as well.  Continue Duragesic patch for pain management.   Again, I explained to the patient that given her diabetes, abnormal EKG,  chest pain and severe lung disease, she really cannot afford to have  coronary artery disease that is undiagnosed.  She will let me know if  she wants to proceed with catheterization.     [name]. Eden Emms, MD, [facility]  Electronically Signed    PCN/MedQ  DD: 03/12/2007  DT: 03/13/2007  Job #: [id number]

## 2010-12-06 NOTE — Group Therapy Note (Signed)
Kara [name], Kara [name]              ACCOUNT NO.:  [id number]   MEDICAL RECORD NO.:  [id number]          PATIENT TYPE:  INP   LOCATION:  A318                          FACILITY:  [facility]   PHYSICIAN:  [name], MD   DATE OF BIRTH:  01-15-1946   DATE OF PROCEDURE:  12/21/2007  DATE OF DISCHARGE:                                 PROGRESS NOTE   HISTORY OF PRESENT ILLNESS:  This patient was admitted to the hospital  yesterday with atrial fibrillation and rapid ventricular rate.  She has  been on Cardizem drip and her rate has come down some.  She was started  on therapeutic dose of Lovenox, and we will transition to Coumadin  therapy.  The patient did have a reaction to Duragesic patch and this  has been withheld.   OBJECTIVE:  VITAL SIGNS:  Blood pressure 103/54, respiration 20, pulse  64, and temperature 98.6.  The patient's chemistry shows sodium 140,  potassium 3.4, chloride 101, CO2 of 29, and glucose 201.  LUNGS:  Clear.  HEART:  Irregular rhythm.  ABDOMEN:  No palpable organs or masses.   PLAN:  To transition the patient from Cardizem drip to p.o. diltiazem.  This patient may have to be on Coumadin therapy chronically.  We will  consider this, but for the moment we will continue the Lovenox.      [name], MD  Electronically Signed     AGM/MEDQ  D:  12/21/2007  T:  12/21/2007  Job:  [id number]

## 2010-12-06 NOTE — Assessment & Plan Note (Signed)
[facility] HEALTHCARE                       [place] CARDIOLOGY OFFICE NOTE   GRIER, CZERWINSKI                     MRN:          [id number]  DATE:03/26/2007                            DOB:          20-Sep-1945    Ms. Bradshaw returns today for the followup.  She has had palpitations,  question MAT, question PAF.  She has also had episodes of dyspnea and  diaphoresis which may be an anginal equivalent.  The patient has refused  to have stress test.  I had another long discussion with Mykaila, she  continues to have symptoms of diaphoresis and dyspnea.  I explained to  her that given her age, female status and diabetes that these certainly  could be anginal equivalents.  She has end stage COPD and can not afford  to have a heart problem.  She indicated that even if you did the heart  cath you could not do anything for me.  I told her that she certainly  would not be a candidate for CABG but she would be a candidate for PTCA  or stenting.   Her husband then indicated that they knew a patient who rejected the  stent.  I explained to him that he probably did not reject the stent in  terms of an auto-immune phenomenon but may have clotted it, requiring  followup CABG.  Again, Jackee and her husband had multiple questions.   The bottom line is that she continues to have palpitations at night.  It  is not clear whether these are MAT, PACs, PVCs or other arrhythmia.  The  palpitations do not cause the diaphoresis, her chest pain.  They are  aggravating and concern her in regards to her heart.   I explained to her that we can give her a CardioNet monitor to further  follow this up and she seems amenable to that.  She initially said that  if she decides to have a heart cath she would want me to do it, I told  her I would be happy to take the pictures but that if she needed a  balloon procedure Dr. Juanda Chance, Excell Seltzer or [place] would need to do that,  she understands  this now.   From a heart perspective she is otherwise doing well.  She has chronic  dyspnea secondary to her COPD on home O2.  There has not been any cough,  pleuritic pain or sputum production.  She chronically sleeps with a  brick under her bedpost but there has been no change in her PND or  orthopnea.  She has mild chronic lower extremity edema.   Her palpitations have not changed at all.  They occur throughout the  day, they are particularly bad at night.  They have not caused her to  pass out.  They have not worsened at all, in fact they may have been  improved slightly on the metoprolol 25 b.i.d.   REVIEW OF SYSTEMS:  Otherwise negative.   MEDICATIONS:  1. Nortriptyline 25 b.i.d.  2. Metformin 500 t.i.d.  3. Protonix 40 a day.  4. Ambien 10 nightly.  5. Synthroid  100 mcg a day.  6. Celexa 20 b.i.d.  7. Simvastatin 80 a day.  8. Spironolactone 50 a day.  9. Tricor 145.  10.Demadex 20 b.i.d.  11.Cardizem 120 a day.  12.Duragesic patch 75 mcg every 3 days.  13.Gabapentin 300 t.i.d.  14.Metoprolol 25 b.i.d.   EXAMINATION:  She is a chronically ill-appearing, elderly white female  in no distress.  Her weight is 192, blood pressure is 152/68, pulse 76  and regular, respiratory rate is 16, she is afebrile.  HEENT:  Normal.  Carotids normal without bruit, there is no  lymphadenopathy, no thyromegaly, no JVP elevation.  There is no  significant V wave, no evidence of cor pulmonale.  LUNGS:  Have diffusely decreased breath sounds.  There is minor rhonchi  and no active wheezing.  Diaphragmatic motion is decreased.  There is an S1-S2 with distant heart sounds.  PMI is normal.  There is  no RV heave.  Bowel sounds are positive, no tenderness, no AAA, no  hepatosplenomegaly, no hepatojugular reflux, no bruits.  Femorals are +2  bilaterally without bruits, PTs are +1, there is trace lower extremity  edema.  NEURO:  Nonfocal.  There is no muscular weakness.   Baseline EKG shows T  wave inversions in the lateral leads, particularly  V2 through V6.   IMPRESSION:  1. Some chest pain but primarily diaphoresis and dyspnea, question      anginal equivalent in an elderly diabetic female with abnormal EKG.      Patient refusing stress test.  Continues to consider whether or not      she would be willing to have a heart catheterization.  The risks      were again discussed with the patient, she will let us know how she      wants to proceed but currently does not want an invasive workup.  2. Palpitations.  Question etiology, currently improved on beta      blocker, continue Cardizem.  CardioNet monitor for 4 weeks with      followup, trying to rule out significant malignant or nonsustained      ventricular tachycardia.  Also question of MAT given her lung      disease.  3. Dyspnea, chronic secondary to chronic obstructive pulmonary disease      on 3 liters of O2 continuously.  Continue current inhalers,      followup per Dr. Juanetta Gosling.  4. Diabetes.  Check hemoglobin A1c quarterly, continue metformin.  5. Lower extremity edema.  Currently stable, low salt diet.  Continue      spironolactone at 50 a day.  6. Chronic pain syndrome, continue Duragesic patch 75 mcg q.3 days.      This may be masking possible chest pain and angina, however I think      that her episodes of dyspnea and diaphoresis may be her anginal      equivalent.  7. History of reflux, continue Protonix 40 b.i.d., avoid late night      meals.  8. Hypothyroidism, continue Synthroid 100 mcg a day.  TSH and T4 in 3      months.   I will see the patient back after she has had her monitor for a while  and she will continue to consider catheterization.     [name]. Eden Emms, MD, [facility]  Electronically Signed    PCN/MedQ  DD: 03/26/2007  DT: 03/26/2007  Job #: [id number]

## 2010-12-06 NOTE — Assessment & Plan Note (Signed)
[facility] HEALTHCARE                       [place] CARDIOLOGY OFFICE NOTE   LETONYA, MANGELS                     MRN:          [id number]  DATE:01/02/2008                            DOB:          [birth year]    Kara [name] returns today for followup.  She has had previous  palpitations.  It is unclear as to whether she has had MAT or PAF.  She  is not a great Coumadin candidate.  She is unsteady on her feet and is  on home O2.   She had an extensive cardiac workup at the end of October 2008.  She has  no coronary artery disease.  Her pulmonary pressures are normal.  She  has normal LV function.   She continues to get occasional palpitations.  She saw Dr. Juanetta Gosling  recently and apparently because of question of rapid heartbeat, he  increased her Lopressor to 50 b.i.d.   I think this is fine.  She is also taking 300 of Cardizem.   Otherwise, the patient has chronic exertional dyspnea.  She is on 3  liters of O2 at all time.  She has not had a cough or fever.  There has  been no sputum production.  She is not having chest pain.  There has  been no lower extremity edema, PND, or orthopnea.   She has been compliant with her meds.   She is currently on nortriptyline 25 b.i.d., Protonix 40 a day, Ambien,  Synthroid 100 mcg a day, Celexa 20 b.i.d., simvastatin 80 a day,  spironolactone 50 a day, TriCor 145 a day, gabapentin 300 t.i.d.,  metoprolol 50 b.i.d., metformin 500 q.i.d., and Cardizem 300 a day.   PHYSICAL EXAMINATION:  GENERAL:  Remarkable for chronic ill-appearing,  premature white female, in no distress.  VITAL SIGNS:  Weight is 182, blood pressure is 110/66, pulse is 88 and  regular, afebrile, and respiratory rate is 14.  HEENT:  Unremarkable.  Carotids are normal without bruit.  No  lymphadenopathy, thyromegaly, or JVP elevation.  LUNGS:  Clear with diaphragmatic motion.  No wheezing.  HEART:  S1 and S2.  Normal heart sounds.  PMI  normal.  ABDOMEN:  Benign.  Bowel sounds positive.  No AAA.  No bruit.  No  hepatosplenomegaly or hepatojugular reflux.  No tenderness.  Distal  pulses are intact.  No edema.  NEURO:  Nonfocal.  SKIN:  Warm and dry.  No muscular weakness.   IMPRESSION:  1. Palpitations, question of history of paroxysmal atrial fibrillation      versus multifocal atrial tachycardia, currently in sinus.  Continue      higher dose beta-blocker and Cardizem.  No indication for Coumadin.  2. Previous chest pain, normal cath.  Followup stress test in 2-3      years.  3. O2-dependent lung disease.  Follow up with Dr. Juanetta Gosling.  Make sure      she gets Pneumovax and flu vaccine at appropriate times a year.      Continue Claritin p.r.n. for seasonal allergies.  4. History of bipolar disease.  Continue lithium and Celexa.  Mood  currently seems appropriate.  5. Hypothyroidism.  Continue Synthroid 100 mcg a day.  TSH and T4 in 6      months.  6. History of neuropathy.  Continue gabapentin 300 t.i.d.  Wear socks      at all time and have regular foot care.   Overall, I think the patient's heart is stable and she will be seen in  the [facility] in [place] in 6 months.     [name]. Eden Emms, MD, [facility]  Electronically Signed    PCN/MedQ  DD: 01/02/2008  DT: 01/03/2008  Job #: [id number]

## 2010-12-06 NOTE — Assessment & Plan Note (Signed)
[facility] HEALTHCARE                        CARDIOLOGY OFFICE NOTE   Kara [name]                     MRN:          [id number]  DATE:07/03/2007                            DOB:          12-06-1945    Kara [name] returns today for follow-up.  She is a patient of Dr. Juanetta Gosling  with significant O2 dependent COPD.  She has had multiple coronary risk  factors including diabetes with recurrent episodes of diaphoresis and  chest pain.  We did a heart cath on her a few weeks ago.  She had no  significant coronary disease, no pulmonary hypertension, good LV  function.  We are very happy to hear this as Kara [name] had multiple risk  factors and had been having symptoms.  She was very relieved by this as  well.  Her leg healed well.  She had multiple questions today in regards  to her heart rate and previous EKGs.  I tried to answer all of her  questions.  In particular, I told her that diabetics tend to run a  little bit higher heart rate, particularly if she is on O2 at home.  She  has had PAF or MAT and is in sinus rhythm.  She is on beta-blockers and  Cardizem.  There is no indication for antiarrhythmics, although now that  we know she has no coronary disease, it is going to be easier in the  future.   REVIEW OF SYSTEMS:  Remarkable for chronic exertional dyspnea secondary  to her emphysema.  She has gotten a flu shot.  There has been no fever,  cough or sputum.   MEDICATIONS:  1. Nortriptyline 25 b.i.d.  2. Protonix 40 b.i.d.  3. Ambien 10 nightly.  4. Synthroid 100 mcg a day.  5. Celexa 200 b.i.d.  6. Simvastatin 80 a day.  7. Spironolactone 50 a day.  8. Tricor 145 a day.  9. Demadex 20 b.i.d.  10.Cardizem 120 a day.  11.Duragesic patch 75 a day.  12.Gabapentin 300 t.i.d.  13.Metoprolol 25 b.i.d.  14.Metformin 500 q.i.d.   PHYSICAL EXAMINATION:  VITAL SIGNS:  Her blood pressure is 130/70, pulse  68 and regular, she is afebrile.  Her weight  is 192.  GENERAL APPEARANCE:  An overweight, elderly white female in no distress.  She has oxygen on.  HEENT:  Unremarkable.  NECK:  Carotids are normal without bruit, no lymphadenopathy,  thyromegaly, JVP elevation.  LUNGS:  Clear.  Good diaphragmatic motion.  No wheezing.  No active  rhonchi.  CARDIOVASCULAR:  S1 and S2, distant heart sounds.  PMI normal.  ABDOMEN:  Benign.  Bowel sounds positive.  No AAA, no tenderness.  No  hepatosplenomegaly, hepatojugular reflux.  EXTREMITIES:  Cath site is well healed without residual ecchymosis or  bruit.  Distal pulses intact with no edema.  NEUROLOGIC:  Nonfocal.  No muscular weakness.   IMPRESSION:  1. Previous chest pain with diaphoresis in a diabetic.  No evidence of      coronary disease by cath. Follow up with Dr. Juanetta Gosling regarding      other possible etiologies.  2. Hypertension, currently  well controlled.  Continue current      medication including diuretics.  3. History of paroxysmal atrial fibrillation MAT, continue beta-      blocker and Cardizem.  No evidence of recurrence.  Avoid      stimulants.  4. Anxiety/depression with chronic pain syndrome.  Follow up with Dr.      Juanetta Gosling.  Continue fentanyl patch.  Continue Duragesic patch as      well as gabapentin and Nortriptyline.  5. History of reflux.  Work on decreasing weight to help with reflux.      Protonix 40 b.i.d.  6. Diabetes, poorly controlled.  Since I last saw her, Metformin has      been increased to q.i.d.  I suspect she will need to add a second      oral agent soon.     Kara [name]. Eden Emms, MD, [facility]  Electronically Signed    PCN/MedQ  DD: 07/03/2007  DT: 07/04/2007  Job #: [id number]

## 2010-12-06 NOTE — Assessment & Plan Note (Signed)
[facility] HEALTHCARE                       [place] CARDIOLOGY OFFICE NOTE   LAFAWN, LENOIR                     MRN:          [id number]  DATE:05/09/2007                            DOB:          12-13-1945    Kaizley returns today for followup.  She continues to have multiple  issues with stereotypical episodes of diaphoresis and shortness of  breath.  Given her diabetes, I have been concerned that these are a form  of angina.  She may also have MAT or PAF.  We gave her a monitor to  take, and it only showed sinus rhythm with PACs.   I have been talking with the patient over the last month or 2 about  having a heart cath.  She finally agreed to have it today.  She and her  boyfriend have concerned about it.  I have repeatedly gone over the  risk, including stroke, need for emergency surgery, bleeding, and dye  reaction.  She understands this.   She also understands my concern about her stereotypical episodes of  diaphoresis and shortness of breath being a form of angina in a  diabetic.   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   The patient's review of systems is otherwise negative.   CURRENT MEDICATIONS:  1. Nortriptyline 25 b.i.d.  2. Metformin 500 t.i.d.  3. Protonix 40 b.i.d.  4. Ambien 10 nightly.  5. Synthroid 100 mcg a day.  6. Celexa 20 b.i.d.  7. Simvastatin 80 a day.  8. Spironolactone 50 a day.  9. TriCor 145 a day.  10.Demodex 20 b.i.d.  11.Cardizem 120 a day.  12.Duragesic patch 75 mcg every 3 days.  13.Gabapentin 300 t.i.d.  14.Metoprolol 25 b.i.d.   EXAM:  Remarkable for a chronically ill-appearing white female in no  distress.  Weight is 190.  She has 3L of O2.  Blood pressure is 122/70, pulse 82  and regular.  HEENT:  Unremarkable.  Carotids are normal without bruit.  There is no thyromegaly.  No JVP  elevation.  LUNGS:  Diffusely decreased breath sounds with no  active wheezing.  Diaphragmatic motion is normal.  There is an S1, S2 with distant heart sounds.  PMI is normal.  Bowel sounds positive.  No hepatosplenomegaly.  No hepatojugular reflux.  No tenderness.  No AAA.  Femorals are +3 bilaterally without bruits.  PT +2.  There is trace  lower extremity edema.   Her baseline electrocardiogram shows diffuse T wave changes in V1  through V4, worrisome for anterior ischemia.   IMPRESSION:  1. Diabetic with abnormal EKG, unable to stress given her end-stage      chronic obstructive pulmonary disease.  Symptoms concerning for      high-grade coronary disease, right and left heart cath to be      pursued.  Risks discussed.  She will hold her Glucophage prior to      the cath.  2. Palpitations.  No evidence of medication adherence training or      paroxysmal atrial fibrillation on [employer]  monitoring.  Continue      beta blocker.  3. Chronic pain syndrome, primarily from lower back issues.  Continue      Duragesic patch, and we will have to be careful with regard to      sedation during the cath since she is on Celexa, Duragesic patch,      gabapentin, and takes Xanax at home, as well as Ambien at night.  4. Diabetes.  Continue metformin 500 t.i.d.  Hold for heart cath.  She      is currently not taking any insulin.  Follow up with primary care      physician for this.  5. Hypothyroidism.  Continue Synthroid 100 mcg a day.  Follow up TSH      and T4.  6. Hypercholesterolemia in the setting of probable coronary disease.      Continue simvastatin 80 a day.  Lipid and liver profile in 6      months.  Continue TriCor for hypertriglyceridemia.  7. Lower extremity edema.  Currently stable.  Much improved.  Possibly      secondary to Cor pulmonale.  Continue Demodex 20 b.i.d.  8. Dyspnea with end stage chronic obstructive pulmonary disease.  A      right heart cath will be done at the time of angiography to rule      out pulmonary hypertension to  make sure she would not benefit from      a vasodilator, such as Flolan or Tracleer.   Further recommendations will be based on the results of her cath.     [name]. Eden Emms, MD, [facility]  Electronically Signed    PCN/MedQ  DD: 05/09/2007  DT: 05/10/2007  Job #: [id number]

## 2010-12-06 NOTE — Procedures (Signed)
Kara [name], Kara [name]              ACCOUNT NO.:  [id number]   MEDICAL RECORD NO.:  [id number]          PATIENT TYPE:  OUT   LOCATION:  RAD                           FACILITY:  [facility]   PHYSICIAN:  Peter C. Eden Emms, MD, FACCDATE OF BIRTH:  04-20-1946   DATE OF PROCEDURE:  03/13/2007  DATE OF DISCHARGE:                                ECHOCARDIOGRAM   INDICATION:  Dyspnea, pulmonary hypertension.   Left ventricular cavity size was normal.  There was mild LVH.  There was  mild global hypokinesis with an EF of 50-55%.  There was no septal  flattening to suggest pulmonary hypertension.  Mitral valve had trivial  MR.  Left atrium and right-sided cardiac chambers were normal.  There  was no evidence of cor pulmonale.  There was mild TR.  PA pressures  could not be estimated.  Aortic valve was slightly sclerotic.  There was  trivial aortic insufficiency.  Subcostal imaging revealed no atrioseptal  defect, no source of embolus and no effusion.   FINAL IMPRESSION:  1. Mild left ventricular hypertrophy, septal thickness 13 mm.  Mild      global hypokinesis, ejection fraction 50-55%.  2. Normal right ventricle with no evidence of pulmonary hypertension,      mild tricuspid regurgitation, unable to estimate pulmonary artery      pressures.  3. Aortic valve sclerosis, trivial atrial regurgitation.  4. Trivial mitral regurgitation.  5. No pericardial effusion.      [name]. Eden Emms, MD, [facility]  Electronically Signed     PCN/MEDQ  D:  03/13/2007  T:  03/14/2007  Job:  [id number]

## 2010-12-06 NOTE — H&P (Signed)
NAMEMELONEE, GERSTEL              ACCOUNT NO.:  [id number]   MEDICAL RECORD NO.:  [id number]          PATIENT TYPE:  INP   LOCATION:  A318                          FACILITY:  [facility]   PHYSICIAN:  Angus G. [name], MD   DATE OF BIRTH:  Apr 21, 1946   DATE OF ADMISSION:  12/20/2007  DATE OF DISCHARGE:  LH                              HISTORY & PHYSICAL   A 65 year old white female was seen in the office on day of admission  with chief complaint being discomfort in chest with a rapid and  irregular heartbeat also headache and burning sensation in her chest and  over other parts of the body.  This patient had been seen in the  emergency department last evening at [facility] and  evaluated.  Apparently, she was seen by Dr. Earlyne Iba and was  examined.  There was some concern over the fact that the fentanyl patch  was causing some problem with an adverse reaction.  She apparently has  taken the patch off and replaced that after a couple of hours.  The  [facility] was called and it was noted that the patch should  be changed from one location to the other.  She was given Atarax.  This  seemed to relieve some of her symptoms.  She continued to have  discomfort and was seen in the office and subsequently admitted because  of a primary finding of atrial fibrillation with rapid ventricular rate.  This was confirmed by EKG in the office.   FAMILY HISTORY:  See previous records.   SOCIAL HISTORY:  The patient does not smoke or drink alcohol.   PAST MEDICAL HISTORY:  The patient has a prior history of CHF, COPD,  hypertension, diabetes, acid reflux, and hypothyroidism.   PAST SURGICAL HISTORY:  Appendectomy, cesarean section, cholecystectomy,  hysterectomy, and thyroid surgery.   ALLERGIES:  No known allergies.   MEDICATION LIST:  1. Demadex 20 mg b.i.d.  2. Spironolactone 50 mg b.i.d.  3. Simvastatin 80 mg daily.  4. Metformin 500 mg 2 b.i.d.  5.  Synthroid 0.1 mg daily.  6. Protonix 40 mg daily.  7. Metoprolol 25 mg b.i.d.  8. NitroQuick 0.4 mg p.r.n.  9. Cardizem 120 mg daily.  10.TriCor 145 mg daily.  11.Celexa 20 mg daily.  12.Xanax 1 mg t.i.d.  13.Ambien 10 mg nightly.  14.Gabapentin 300 mg t.i.d.  15.Duragesic patch 75 mcg every 3 days.  16.Nasal O2 at 3 liters.   REVIEW OF SYSTEMS:  HEENT:  Negative.  CARDIOPULMONARY:  The patient has  irregular heartbeat and is concerned about palpitations.  No cough but  dyspnea with exertion.  GI:  Negative for nausea, vomiting, diarrhea, or  abdominal pain.  GU:  Negative for dysuria, urgency, frequency, etc.  SKIN:  The patient complained of diffuse burning sensation on skin.   PHYSICAL EXAMINATION:  GENERAL: Uncomfortable, slightly dyspneic white  female.  VITAL SIGNS: _____.  HEENT: Eyes: PERRLA.  TM negative.  Oropharynx benign.  NECK: Supple.  No JVD or thyroid abnormalities.  HEART: Irregular heartbeat.  LUNGS: Diminished  breath sounds bilaterally.  ABDOMEN: No palpable organs or masses.  No organomegaly.  EXTREMITIES:  Free of edema.  NEUROLOGICAL:  No focal deficits.   DIAGNOSES:  1. Atrial fibrillation.  2. History of congestive heart failure.  3. Chronic obstructive pulmonary disease.  4. Hypertension.  5. Diabetes.  6. Acid reflux.  7. Hypothyroidism.  Possible side effects from recent use of fentanyl      patch.   PLAN:  Put the patient on intravenous Cardizem for rate control.  The  patient will be started on Lovenox subcutaneously.  We will run cardiac  enzymes to rule out any evidence of ischemic heart disease.      [name], MD  Electronically Signed     AGM/MEDQ  D:  12/20/2007  T:  12/21/2007  Job:  [id number]

## 2010-12-06 NOTE — Discharge Summary (Signed)
NAMETENIA, GOH              ACCOUNT NO.:  [id number]   MEDICAL RECORD NO.:  [id number]          PATIENT TYPE:  INP   LOCATION:  A318                          FACILITY:  [facility]   PHYSICIAN:  [name], MD   DATE OF BIRTH:  Mar 26, 1946   DATE OF ADMISSION:  12/20/2007  DATE OF DISCHARGE:  05/31/2009LH                               DISCHARGE SUMMARY   DIAGNOSES:  1. Atrial fibrillation.  2. Congestive heart failure.  3. Hypertension.  4. Diabetes mellitus type 2.  5. Esophageal reflux.  6. Hypothyroidism.  7. Possible side effects from recent use of fentanyl patch.   The patient's condition is stable at the time of her discharge.   HISTORY OF PRESENT ILLNESS:  A 65 year old white female who was seen in  the office on the day of admission with chief complaint being discomfort  in chest with rapid irregular heart beat, also headache and burning  sensation in her chest and no other parts of her body.  The patient was  seen in the emergency department prior to admission.  She was seen by  Dr. Earlyne Iba, was examined.  There was some concern over the fact  that her fentanyl patch was causing some of her problems with an adverse  reaction.  She apparently was taken out of the patch and poison control  was called and noted that patch should be changed from one location to  the other and given Atarax with.  This seemed to relieve some of her  symptoms.  She continued to have discomfort, was seen in the office and  subsequently admitted because of primary finding of atrial fibrillation  with rapid ventricular rate.  This was confirmed by EKG in the office.   PHYSICAL EXAMINATION:  GENERAL:  Uncomfortable and slightly dyspneic  white female.  VITAL SIGNS:  Blood pressure 103/54, respirations 20, pulse 64, and temp  98.6  HEENT:  Eyes:  PERRLA.  TMs negative.  Oropharynx benign.  NECK:  Supple.  No JVD or thyroid abnormalities.  HEART:  Irregular heartbeat.  LUNGS:   Diminished breath sound bilaterally.  ABDOMEN:  No palpable organs or masses.  No organomegaly.  EXTREMITIES:  Free of edema.  NEUROLOGIC:  No focal deficit.   LABORATORY DATA:  __________   HOSPITAL COURSE:  The patient at the time of admission was placed on  half-normal saline telemetry.  Vital signs were monitored q.i.d. and she  was given subcutaneous Lovenox 5 mg/kg q.12 h., 2 g low-sodium diet,  diabetic, 2000 calories, IV Cardizem was begun, and initially, she was  given 25 mg as a bolus and then was started on a drip of 15 mg per hour,  Accu-Cheks were monitored a.c. and nightly.  She was placed on sliding  scale NovoLog moderate.  She was continued on simvastatin 80 mg daily,  Demadex 20 mg b.i.d., spironolactone 50 mg b.i.d., Synthroid 0.1 mg  daily, Protonix 40 mg daily, TriCor 145 mg daily, Celexa 20 mg daily,  Xanax 1 mg t.i.d., gabapentin 300 mg t.i.d., and Atarax 50 mg q.4 h.  p.r.n.  Her cardiac markers were monitored and remained normal.  By Dec 21, 2007, the patient's Cardizem drip was discontinued, as she was in sinus  rhythm at this time.  She was placed on Cardizem LA 300 mg daily,  Tylenol 500 mg every 4 hours p.r.n. for discomfort, as well as Vicodin  5/500.   The patient progressively improved and was able to be discharged after  the second hospital day.   DISCHARGE MEDICATIONS:  The patient was discharged on Cardizem LA 300 mg  daily, Vicodin 5/500 every 4 hours p.r.n. for pain, Atarax 50 mg q.i.d.,  nasal O2 at 3 liters per minute, Zocor 20 mg daily, Demadex 20 mg  b.i.d., Aldactone 25 mg b.i.d., Protonix 40 mg daily, Celexa 20 mg  daily, Xanax 1 mg t.i.d., Neurontin 300 mg b.i.d., Synthroid 100 mg  daily, TriCor 145 mg daily.   DISCHARGE CONDITION:  The patient was stable at the time of her  discharge.      [name], MD  Electronically Signed     AGM/MEDQ  D:  12/30/2007  T:  12/30/2007  Job:  [id number]

## 2010-12-06 NOTE — Assessment & Plan Note (Signed)
[facility]                       [place] CARDIOLOGY OFFICE NOTE   GLENDON, FISER                     MRN:          [id number]  DATE:09/08/2008                            DOB:          June 12, 1946    CARDIOLOGIST:  She was previously followed by Dr. Eden Emms.  She will be  reestablished with Dr. Daleen Squibb, as she has seen him in the past.   PRIMARY CARE PHYSICIAN:  [name], MD   REASON FOR VISIT:  Followup.   HISTORY OF PRESENT ILLNESS:  [name] is a 65 year old female patient  with severe COPD on chronic O2, who presents to the office today for  followup.  She was previously followed by Dr. Eden Emms.  She does have a  long history of palpitations and there has been concern over whether or  not she has paroxysmal atrial fibrillation versus multifocal atrial  tachycardia.  She did undergo event monitoring in September 2008 that  demonstrated normal sinus rhythm and PACs.  She also had episodes of  chest discomfort.  She was set up for right and left heart  catheterization in October 2008 that demonstrated nonobstructive  disease.  She did have a 40-50% mid circumflex lesion.  Her right-sided  pressures demonstrated no evidence of pulmonary hypertension.  She was  last seen by Dr. Eden Emms in June 2009.  Since that time, she tells me  that she has started to lose weight.  She has also developed anemia.  Her last hemoglobin in September 2009 was 9.7 with an MCV of 97.9.  She  is apparently being worked up by Dr. Renard Matter for her anemia.  She has  apparently been on iron therapy for this.  She also follows chronically  with Dr. Renae Fickle for irritable bowel syndrome and gastroesophageal reflux  disease.  Of note, she has never had a history of bleeding ulcers or  diverticular bleeding.  In the office today, she notes that she is doing  okay.  She continues to note episodes of palpitations, but since she has  been placed on metoprolol and  Cardizem, she has done well.  Her  palpitations are fairly infrequent.  She has chronic dyspnea on  exertion.  There has been no change.  She denies any significant change  in cough.  She denies any paroxysmal nocturnal dyspnea.  She sleeps on 2-  3 pillows.  There has been no change.  She has chronic pedal edema  without change.  She does have chest pain from time to time.  This is  nonexertional.  This is fairly stable since her cardiac catheterization  in October 2008.   CURRENT MEDICATIONS:  Ambien 10 mg at bedtime, Synthroid 0.1 mg daily,  Celexa 20 mg b.i.d., Simvastatin 80 mg daily, Spirolactone 50 mg daily,  Demadex 20 mg b.i.d.,  Gabapentin 300 mg b.i.d., Metoprolol 25 mg b.i.d., Metformin 1 g b.i.d.,  Cardizem LA 300 mg daily, Trilipix daily, Xanax 1 mg daily,  Nitroglycerin p.r.n.,  Avinza 90 mg p.r.n., Morphine sulfate 50 mg q.4 h. P.r.n., Guaifenesin  b.i.d., Polyethylene glycol p.r.n., Symax FasTabs p.r.n.   PHYSICAL EXAMINATION:  GENERAL:  She is a well-nourished, well-developed  female wearing O2 in no acute distress.  VITAL SIGNS:  Blood pressure 120/64, pulse 56, weight 174 pounds.  This  is down 8 pounds since her last visit in June 2009.  HEENT:  Normal.  NECK:  Without JVD.  CARDIAC:  Normal S1 and S2.  Regular rate and rhythm.  LUNGS:  Decreased breath sounds bilaterally.  No rales.  ABDOMEN:  Soft, nontender.  EXTREMITIES:  With trace 1+ edema bilaterally.  NEUROLOGIC:  She is alert and oriented x3.  Cranial nerves II through  XII grossly intact.   An electrocardiogram reveals sinus rhythm with heart rate of 66, normal  axis, and T-wave inversions in V1 through V4.  No significant change  since prior tracing dated March 12, 2007.   ASSESSMENT AND PLAN:  1. Palpitations.  I have reviewed her chart and it appears that she      has had a history of questionable paroxysmal atrial fibrillation      versus multifocal atrial tachycardia.  In the past, Dr.  Eden Emms      noted that the patient was not a great Coumadin candidate.  As      noted, she has had previous event monitors that demonstrated normal      sinus rhythm and PACs.  I discussed this with her today.  She tells      me that she would not agree to take Coumadin if she needed it.  I      talked to her somewhat about aspirin.  She is hesitant to start on      this, but will try it.  I have asked her to discuss this further      with her primary care physician who is working up her anemia and      her weight loss as well as her gastroenterologist who follows her      chronically.  If they have no objections to her starting on      aspirin, she can start on aspirin 81 mg day.  2. Chest pain.  She had a cardiac catheterization in October 2008 that      demonstrated mild-to-moderate nonobstructive disease.      Specifically, she had a 40-50% mid circumflex lesion.  Her chest      pain is unchanged.  No further workup is warranted at this time.  3. Chronic obstructive pulmonary disease on chronic oxygen.  She      continues with her current therapy.  4. Hypertension.  This is controlled.  5. Dyslipidemia.  The patient should have a goal LDL of less than or      equal to 70 with her history of coronary disease and minimal      nonobstructive disease in the past.  This is followed by Dr.      Renard Matter.  6. Chronic lower extremity edema.  She continues on Demadex and      spironolactone.  This is overall stable.  7. Hypothyroidism.  She is treated with Synthroid.   DISPOSITION:  The patient used to see Dr. Daleen Squibb in our [place] office.  I will bring her back in followup with Dr. Daleen Squibb in 6 months or sooner  p.r.n.      Tereso Newcomer, PA-C  Electronically Signed      Gerrit Friends. Dietrich Pates, MD, [facility]  Electronically Signed   SW/MedQ  DD: 09/08/2008  DT: 09/09/2008  Job #: [id number]   cc:  [name], MD

## 2010-12-06 NOTE — Cardiovascular Report (Signed)
Kara [name], Kara [name]              ACCOUNT NO.:  [id number]   MEDICAL RECORD NO.:  [id number]          PATIENT TYPE:  OIB   LOCATION:  2899                         FACILITY:  [facility]   PHYSICIAN:  [name]. Eden Emms, MD, FACCDATE OF BIRTH:  12-14-1945   DATE OF PROCEDURE:  DATE OF DISCHARGE:  05/16/2007                            CARDIAC CATHETERIZATION   HISTORY:  [name] is a 65 year old diabetic who has had episodes of  diaphoresis and shortness of breath.  She does have end-stage COPD on  home O2.  Study was done to rule out coronary disease and assess for  pulmonary hypertension.   PROCEDURE:  Cine catheterization was done with a 6-French arterial  sheath and a 7-French venous sheath   FINDINGS:  Left main coronary artery was normal.   Left anterior descending artery was normal.   The first diagonal branch was normal.  Second diagonal branch was  normal.   Circumflex coronary artery was nondominant.  There was a high takeoff of  obtuse marginal branch which was normal.  The mid circ had a 40-50%  smooth discrete stenosis.   Distal circ was normal.   The right coronary artery was dominant.  There were multiple RV  branches.  The PDA itself was somewhat diminutive.  There was no  significant disease.   RAO ventriculography showed normal LV function.  EF was 60%.  There was  no gradient across the aortic valve.  No MR.  Aortic pressure was  130/62, LV pressure was 130/19, right heart catheterization was done due  to the significant dyspnea, COPD and to rule out pulmonary hypertension.  Mean right atrial pressure was 14, PA pressure was 43/22, mean PA  pressure was 32, pulmonary capillary wedge pressure was 80.   IMPRESSION:  The patient does not have significant coronary artery  disease to explain these stereotypical episodes of dyspnea and  diaphoresis.  They would not appear to be anginal equivalent.  There is  no evidence of pulmonary hypertension.  She would not  benefit from  pulmonary vasodilators.  She will continue to follow up with her primary  care MD in regards to her home O2 and emphysema.   I will see back in the office to check her groin in a week.  It is  encouraging that she does not have critical coronary disease.      [name]. Eden Emms, MD, [facility]  Electronically Signed     PCN/MEDQ  D:  05/16/2007  T:  05/17/2007  Job:  [id number]   cc:   [facility]

## 2010-12-09 NOTE — Op Note (Signed)
NAME:  Kara [name], Kara [name]                        ACCOUNT NO.:  [id number]   MEDICAL RECORD NO.:  [id number]                   PATIENT TYPE:  AMB   LOCATION:  DAY                                  FACILITY:  [facility]   PHYSICIAN:  Lionel December, M.D.                 DATE OF BIRTH:  [birth year]   DATE OF PROCEDURE:  10/09/2003  DATE OF DISCHARGE:                                 OPERATIVE REPORT   PROCEDURE:  Esophagogastroduodenoscopy followed by total colonoscopy.   INDICATIONS FOR PROCEDURE:  Kara [name] is a 65 year old Caucasian female who has  multiple medical problems who has had a recent drop in her H&H.  She also  has been having intermittent rectal bleeding.  She has chronic GERD and is  maintained on double-dose PPI.  She also complains of epigastric and left  upper quadrant pain which is more or less a chronic finding.  Her last  colonoscopy was in October of 1999 with removal of three polyps that turned  out to be hyperplastic.  She is undergoing diagnostic evaluation.  The  procedure and risks were reviewed with the patient, and informed consent was  obtained.   PREOPERATIVE MEDICATIONS:  Cetacaine spray for pharyngeal topical  anesthesia, Demerol 50 mg IV, Versed 15 mg IV.   FINDINGS:  The procedures were performed in the endoscopy suite.  The  patient's vital signs and O2 saturations were monitored during the procedure  and remained stable.  Despite fairly high dose of Versed, she could never be  sedated but tolerated the procedures well.   PROCEDURE #1, ESOPHAGOGASTRODUODENOSCOPY:  The patient was placed in the  left lateral recumbent position, and the Olympus videoscope was passed via  the oropharynx without any difficulty into the esophagus.   Esophagus:  The mucosa of the esophagus was normal throughout.  The  squamocolumnar junction was wavy.  No ring or stricture was noted.   Stomach:  It was empty and distended very well with insufflation.  The folds  of the proximal  stomach were normal.  Examination of the mucosa revealed  nodularity of the mucosa at the gastric body along with a few small polyps.  Biopsy was taken from these polyp and nodular mucosa and submitted in one  container.  The antral mucosa was normal.  The pyloric channel, angularis,  fundus, and cardia were normal.   Duodenum:  Examination of the bulb and second part of the duodenum was  normal.   The endoscope was withdrawn and the patient prepared for procedure #2.   PROCEDURE #2, COLONOSCOPY:  Rectal examination was performed.  No  abnormality noted on external or digital exam.  The Olympus videoscope was  placed into the rectum and advanced into the region of the sigmoid colon and  beyond.  The preparation was satisfactory.  Scattered diverticula were noted  in the sigmoid and descending colon.  The scope was passed to the cecum  which was identified by the ileocecal valve.  The blunt end of the cecum was  normal.  As the scope was withdrawn, the colonic mucosa was carefully  examined.  There was a single small polyp at the hepatic flexure which was  ablated by cold biopsy.  The mucosa of the rest of the colon was normal.  The  rectal mucosa similarly was normal.  The scope was retroflexed to  examine the anorectal junction, and hemorrhoids were noted below the dentate  line with focal erythema.  The endoscope was straightened and withdrawn.  The patient tolerated the procedure well.   FINAL DIAGNOSES:  1. Small gastric polyps at body along with mucosal nodularity.  Biopsies     taken to make sure we are not dealing with enterochromaffin cell-like     hyperplasia given she has been on chronic acid suppression.  2. No evidence of peptic ulcer disease or Barrett's esophagus.  3. Left colonic diverticulosis.  4. External hemorrhoids felt to be the source of her rectal bleeding.   RECOMMENDATIONS:  1. She will resume her usual diet.  She should continue her high fiber diet     and  MiraLax.  2. Anusol-HC suppository, one per rectum at bedtime for two weeks.  3. I will contact the patient with the biopsy results and further     recommendations.      ___________________________________________                                            Lionel December, M.D.   NR/MEDQ  D:  10/09/2003  T:  10/09/2003  Job:  [id number]   cc:   [name]. Neijstrom, MD  [address]  [place]  [place] [address]  Fax: [phone]   [name], M.D.  [address]  [place]  [place] [address]  Fax: [phone]

## 2010-12-09 NOTE — Op Note (Signed)
[name], Kara              ACCOUNT NO.:  [id number]   MEDICAL RECORD NO.:  [id number]          PATIENT TYPE:  AMB   LOCATION:  DAY                           FACILITY:  [facility]   PHYSICIAN:  Lionel December, M.D.    DATE OF BIRTH:  February 20, 1946   DATE OF PROCEDURE:  12/16/2005  DATE OF DISCHARGE:  12/14/2005                                 OPERATIVE REPORT   PROCEDURE:  Esophageal pH monitoring with Bravo device.   INDICATION:  [name] is a 65 year old Caucasian female with multiple medical  problems who has chronic GERD and maintained on double-dose PPI but she  feels her symptoms are not well-controlled and she has throat symptoms.  She  had EGD and her esophagus was normal.  She therefore have Bravo device  placed on Dec 14, 2005.   FINDINGS:  Day one analysis.   Please note that this study is being done on therapy.  She is on omeprazole  20 mg b.i.d..   FINDINGS:  Day one analysis:  Duration 20 hours and 27 minutes.   Number of reflux episodes 49, 40 of which occurred in upright position and 9  in supine.  Number of reflux episodes longer than 5 minutes was 3.  Longest reflux episode was 14 minutes which occurred in upright position.  Time pH less than 4 was 66 minutes and fraction time pH less than 4 was  5.4%.   Day two analysis:  Duration 19 hours and 14 minutes.  Number of reflux  episodes 18.  Two occurred in upright position and 16 in supine which is  just opposite of what happened on day one.  Number of reflux episodes greater than 5 minutes was zero.  Duration of longest reflux episode was 4 minutes.  Time pH below 4 was 13 minutes.  Fraction time pH less than 4 was 1.1%.   Combined 2-day analysis:  Duration of the study was 39 hours and 41 minutes.  Number of reflux episodes 67.  Number of reflux episodes greater than 5 minutes is 3.  Duration of longest reflux episode 14 minutes.  Time pH below 4 is 78 minutes and fraction time pH below 4 is 3.3%.   Symptom diary:  While the patient did not provide as with symptom diary, she  did tell us that she had bad reflux after she ate fish just beginning of day  2.  Review of the tracing reveals that she had 11 episodes of reflux in 2  hour period which corresponded well to her history.   IMPRESSION:  Combined 2-day analysis revealed that the patient is having  physiologic acid reflux.  However, on day one it is abnormal.   I feel she would benefit from increasing the dose of her medicine in a.m.  since she is having more reflux episodes when she is awake and upright.   RECOMMENDATIONS:  The patient advised to increase omeprazole to 40 mg before  breakfast and 20 mg before evening meal.  New prescription was called to her  pharmacy and she will return for OV in 2 months.  Unless  she felt that she  feels a lot better, we will consider switching her to a different agent.      Lionel December, M.D.  Electronically Signed     NR/MEDQ  D:  12/21/2005  T:  12/22/2005  Job:  [id number]   cc:   Ramon Dredge L. Juanetta Gosling, M.D.  Fax: [phone]

## 2010-12-09 NOTE — Op Note (Signed)
Kara [name], Kara [name]              ACCOUNT NO.:  [id number]   MEDICAL RECORD NO.:  [id number]          PATIENT TYPE:  AMB   LOCATION:  DAY                           FACILITY:  [facility]   PHYSICIAN:  Lionel December, M.D.    DATE OF BIRTH:  03-11-1946   DATE OF PROCEDURE:  12/14/2005  DATE OF DISCHARGE:                                 OPERATIVE REPORT   PROCEDURE:  Esophagogastroduodenoscopy with esophageal dilation followed by  placement of Bravo device for pH study.   INDICATIONS:  Kara [name] is a 65 year old Caucasian female with multiple  medical problems who also has refractory GERD and has been maintained on  double dose PPI.  She states her heartburn is well controlled but her  hoarseness is getting worse.  She is watching her diet very closely.  She is  also complaining of intermittent solid food dysphagia.  She is undergoing  diagnostic/therapeutic EGD.  Unless she has evidence of erosive esophagitis  or has mucosal disruption with esophageal dilation, I will place a Bravo  device to find out whether or not her acid reflux is well controlled or not.  She is presently on Protonix 40 mg b.i.d.  The procedure risks were reviewed  with the patient and informed consent was obtained.   MEDS FOR CONSCIOUS SEDATION:  Benzocaine spray for oropharyngeal topical  anesthesia, Demerol 50 mg IV, Versed 18 mg IV.   FINDINGS:  The procedure was performed in the endoscopy suite.  The  patient's vital signs and O2 sats were monitored during the procedure and  remained stable.  Despite the high dose of Versed, she was still awake but  comfortable.  The patient was placed in the left lateral position and the  Olympus videoscope was passed via oropharynx without any difficulty into the  esophagus.   Esophagus:  The mucosa of the esophagus was normal.  The GE junction was  located at 43 cm from the incisors.  No hernia was apparent.   The stomach was empty and distended very well with insufflation.   The folds  of the proximal stomach were normal.  Examination of the mucosa at the body,  antrum, pyloric channel, as well as angularis, fundus and cardia was normal.   Duodenum:  The bulbar mucosa was normal.  The scope was passed to the second  part of the duodenum where mucosa and folds were normal.  The endoscope was  withdrawn.   The esophagus was dilated by passing 56 [place] Maloney dilator to full  insertion.  As the dilator was withdrawn, the endoscope was passed again and  there was no mucosal disruption noted.  We, therefore, proceeded with  placement of Bravo device.   The Bravo device was calibrated already loaded onto a delivery catheter.  The catheter was passed blindly via oropharynx into the esophagus to 37 cm  from the incisors.  This was connected to the suction device for 30 seconds.  The plunger was pushed to secure the device to esophageal mucosa.  It was  turned clockwise and withdrawn.  The endoscope was passed again and Bravo  device  was in good position.  Pictures were taken for the record.  The  endoscope was withdrawn.  The patient tolerated the procedure well.   FINAL DIAGNOSIS:  1.  Normal esophagogastroduodenoscopy.  2.  Esophageal dilation performed by passing 56 [place] Maloney dilator but      no esophageal mucosal disruption induced.  3.  Bravo device placed at distal esophagus for 48 hour pH study which is on      therapy.   PLAN:  She will keep symptom diary as we have explained to her.  She will  continue Protonix as before.  She will return with the device in two days.      Lionel December, M.D.  Electronically Signed     NR/MEDQ  D:  12/14/2005  T:  12/14/2005  Job:  [id number]   cc:   Ramon Dredge L. Juanetta Gosling, M.D.  Fax: [phone]

## 2010-12-09 NOTE — Procedures (Signed)
Kara [name], Kara [name]              ACCOUNT NO.:  [id number]   MEDICAL RECORD NO.:  [id number]          PATIENT TYPE:  OUT   LOCATION:  RAD                           FACILITY:  [facility]   PHYSICIAN:  Hendron Bing, M.D.  DATE OF BIRTH:  1946-01-30   DATE OF PROCEDURE:  12/14/2004  DATE OF DISCHARGE:                                  ECHOCARDIOGRAM   REFERRING PHYSICIAN:  Dr. Renard Matter.   CLINICAL DATA:  A 65 year old woman with chronic lung disease.  Assess for  pulmonary hypertension and evaluate left ventricular function.   M-MODE:  Aorta 3.1.  Left atrium 4.2.  Septum 1.  Posterior wall 1.3.  LV  diastole 4.7.  LV systole 3.9.   RESULTS:  1.  A technically difficult and somewhat limited echocardiographic study.  2.  Mild left atrial enlargement.  Normal right atrial size.  The right      ventricle is prominent with mild hypertrophy and normal systolic      function.  3.  Aortic valve not adequately imaged.  No aortic stenosis.  Very mild      insufficiency.  4.  Normal diameter of the aortic root.  Mildly calcified.  5.  Grossly normal mitral valve.  Mild annular calcification.  Trivial      regurgitation.  6.  Tricuspid valve not adequately imaged.  7.  Pulmonic valve not adequately imaged.  Doppler excludes pulmonic      stenosis.  8.  Normal left ventricular size.  No significant hypertrophy.  Not all      segments adequately imaged.  No areas of akinesis or dyskinesis.      Overall left ventricular systolic function is normal.      RR/MEDQ  D:  12/14/2004  T:  12/14/2004  Job:  [id number]
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
## 2010-12-09 NOTE — Procedures (Signed)
Kara [name], Kara [name]              ACCOUNT NO.:  [id number]   MEDICAL RECORD NO.:  [id number]          PATIENT TYPE:  OUT   LOCATION:  RESP                          FACILITY:  [facility]   PHYSICIAN:  Edward L. Juanetta Gosling, M.D.DATE OF BIRTH:  May 09, 1946   DATE OF PROCEDURE:  11/16/2004  DATE OF DISCHARGE:                              PULMONARY FUNCTION TEST   INTERPRETATION:  1.  Spirometry shows a severe ventilatory defect with air flow obstruction.  2.  Lung volumes show reduction in total lung capacity, which is mild.  3.  DLCO is severely decreased.  4.  There is response to inhaled bronchodilator, which borders on the      criteria for significance.      ELH/MEDQ  D:  11/16/2004  T:  11/17/2004  Job:  [id number]   cc:   [name], MD  [address]  [place]  [place] [address]  Fax: [phone]

## 2011-03-07 ENCOUNTER — Other Ambulatory Visit (HOSPITAL_COMMUNITY): Payer: Self-pay | Admitting: Family Medicine

## 2011-03-07 ENCOUNTER — Ambulatory Visit (HOSPITAL_COMMUNITY)
Admission: RE | Admit: 2011-03-07 | Discharge: 2011-03-07 | Disposition: A | Discharge status: Home / Self Care | Payer: Medicaid Other | Source: Ambulatory Visit | Attending: Family Medicine | Admitting: Family Medicine

## 2011-03-07 DIAGNOSIS — R52 Pain, unspecified: Secondary | ICD-10-CM

## 2011-03-07 DIAGNOSIS — R609 Edema, unspecified: Secondary | ICD-10-CM

## 2011-03-07 DIAGNOSIS — IMO0002 Reserved for concepts with insufficient information to code with codable children: Secondary | ICD-10-CM | POA: Insufficient documentation

## 2011-03-07 DIAGNOSIS — M7989 Other specified soft tissue disorders: Secondary | ICD-10-CM | POA: Insufficient documentation

## 2011-03-07 DIAGNOSIS — M79609 Pain in unspecified limb: Secondary | ICD-10-CM | POA: Insufficient documentation

## 2011-03-07 DIAGNOSIS — M1710 Unilateral primary osteoarthritis, unspecified knee: Secondary | ICD-10-CM | POA: Insufficient documentation

## 2011-03-16 ENCOUNTER — Encounter: Payer: Self-pay | Admitting: Emergency Medicine

## 2011-03-16 ENCOUNTER — Emergency Department (HOSPITAL_COMMUNITY)
Admission: EM | Admit: 2011-03-16 | Discharge: 2011-03-17 | Disposition: A | Discharge status: Home / Self Care | Payer: Medicaid Other | Attending: Emergency Medicine | Admitting: Emergency Medicine

## 2011-03-16 DIAGNOSIS — Z87891 Personal history of nicotine dependence: Secondary | ICD-10-CM | POA: Insufficient documentation

## 2011-03-16 DIAGNOSIS — I509 Heart failure, unspecified: Secondary | ICD-10-CM | POA: Insufficient documentation

## 2011-03-16 DIAGNOSIS — J449 Chronic obstructive pulmonary disease, unspecified: Secondary | ICD-10-CM | POA: Insufficient documentation

## 2011-03-16 DIAGNOSIS — I878 Other specified disorders of veins: Secondary | ICD-10-CM

## 2011-03-16 DIAGNOSIS — Z79899 Other long term (current) drug therapy: Secondary | ICD-10-CM | POA: Insufficient documentation

## 2011-03-16 DIAGNOSIS — E876 Hypokalemia: Secondary | ICD-10-CM | POA: Insufficient documentation

## 2011-03-16 DIAGNOSIS — E119 Type 2 diabetes mellitus without complications: Secondary | ICD-10-CM | POA: Insufficient documentation

## 2011-03-16 DIAGNOSIS — I872 Venous insufficiency (chronic) (peripheral): Secondary | ICD-10-CM | POA: Insufficient documentation

## 2011-03-16 DIAGNOSIS — J4489 Other specified chronic obstructive pulmonary disease: Secondary | ICD-10-CM | POA: Insufficient documentation

## 2011-03-16 DIAGNOSIS — Z885 Allergy status to narcotic agent status: Secondary | ICD-10-CM | POA: Insufficient documentation

## 2011-03-16 HISTORY — DX: Unspecified osteoarthritis, unspecified site: M19.90

## 2011-03-16 HISTORY — DX: Chronic obstructive pulmonary disease, unspecified: J44.9

## 2011-03-16 LAB — BASIC METABOLIC PANEL
BUN: 10 mg/dL (ref 6–23)
Chloride: 92 mEq/L — ABNORMAL LOW (ref 96–112)
GFR calc Af Amer: >60 mL/min (ref >60)
Glucose, Bld: 106 mg/dL — ABNORMAL HIGH (ref 70–99)
Potassium: 2.9 mEq/L — ABNORMAL LOW (ref 3.5–5.1)
Sodium: 140 mEq/L (ref 135–145)

## 2011-03-16 LAB — CBC
HCT: 29.5 % — ABNORMAL LOW (ref 36.0–46.0)
Hemoglobin: 9.3 g/dL — ABNORMAL LOW (ref 12.0–15.0)
WBC: 7.1 10*3/uL (ref 4.0–10.5)

## 2011-03-16 NOTE — ED Provider Notes (Signed)
History   Chart scribed for Lyanne Co, MD by Lyanne Co; the patient was seen in room APA14/APA14; this patient's care was started at 10:05 PM.   CSN: [id number] Arrival date & time: 03/16/2011  9:49 PM  Chief Complaint  Patient presents with  . Leg Swelling   HPI Kara [name] is a 65 y.o. female who presents to the Emergency Department complaining of BLE swelling and redness. Pt reports swelling to BLE has been worse x 4 weeks with pain and redness increasing for 2-3 weeks, concerned because swelling not improving with lasix. Pt started on lasix a few weeks ago after initial swelling, it was increased to 4/day last week still with no change in swelling. Reports decreased urination and unintentional 41lb weight loss in past 7 months; denies sob, cp, abd swelling, or f/c. Pt last seen by Dr. Renard Matter 2 days ago. +h/o DVT and CHF; had ultrasounds on BLE performed last week. Pt has also tried elevation with no relief. Has not tried compression stockings.   Dr. Renard Matter - PCP Dr. Juanetta Gosling - pulmonology [facility]  PAST MEDICAL HISTORY:  Past Medical History  Diagnosis Date  . CHF (congestive heart failure)   . Diabetes mellitus   . Arthritis   . Asthma   . COPD (chronic obstructive pulmonary disease)      PAST SURGICAL HISTORY:  Past Surgical History  Procedure Date  . Cholecystectomy     MEDICATIONS:  Previous Medications   ALPRAZOLAM (XANAX) 1 MG TABLET    Take 1 mg by mouth 3 (three) times daily as needed.     CHOLINE FENOFIBRATE (TRILIPIX) 135 MG CAPSULE    Take 135 mg by mouth daily.     CIPROFLOXACIN (CIPRO) 500 MG TABLET    Take 500 mg by mouth 2 (two) times daily.     CITALOPRAM (CELEXA) 20 MG TABLET    Take 20 mg by mouth daily.     DEXLANSOPRAZOLE (DEXILANT) 60 MG CAPSULE    Take 60 mg by mouth daily.     DILTIAZEM (CARDIZEM CD) 300 MG 24 HR CAPSULE    Take 300 mg by mouth daily.     FUROSEMIDE (LASIX) 20 MG TABLET    Take 20 mg by mouth 2 (two) times  daily.     GABAPENTIN (NEURONTIN) 300 MG CAPSULE    Take 300 mg by mouth 4 (four) times daily.     GUAIFENESIN 200 MG TABLET    Take 200 mg by mouth 2 (two) times daily.     HYOSCYAMINE PO    Take 1 tablet by mouth as needed.     LEVOTHYROXINE SODIUM PO    Take 1 tablet by mouth daily.     LUBIPROSTONE (AMITIZA) 24 MCG CAPSULE    Take 24 mcg by mouth daily.     METOPROLOL TARTRATE PO    Take 1 tablet by mouth 2 (two) times daily.     MORPHINE (MS CONTIN) 30 MG 12 HR TABLET    Take 30 mg by mouth 3 (three) times daily. To treat pain associated with 3 bulging disc in back    NITROGLYCERIN (NITROSTAT) 0.4 MG SL TABLET    Place 0.4 mg under the tongue every 5 (five) minutes as needed.     POLYETHYLENE GLYCOL POWDER (GLYCOLAX/MIRALAX) POWDER    Take 17 g by mouth 2 (two) times daily.     ZOLPIDEM (AMBIEN) 10 MG TABLET    Take 10 mg by mouth at bedtime  as needed.       ALLERGIES:  Allergies as of 03/16/2011 - Review Complete 03/16/2011  Allergen Reaction Noted  . Codeine  03/16/2011     FAMILY HISTORY:  No family history on file.   SOCIAL HISTORY: History   Social History  . Marital Status: Divorced    Spouse Name: N/A    Number of Children: N/A  . Years of Education: N/A   Occupational History  . Not on file.   Social History Main Topics  . Smoking status: Former Games developer  . Smokeless tobacco: Not on file  . Alcohol Use: No  . Drug Use: No  . Sexually Active:    Other Topics Concern  . Not on file   Social History Narrative  . No narrative on file     Review of Systems 10 Systems reviewed and are negative for acute change except as noted in the HPI.  Physical Exam  BP 111/60  Pulse 98  Temp(Src) 99 F (37.2 C) (Oral)  Resp 18  Ht 5\' 6"  (1.676 m)  Wt 171 lb (77.565 kg)  BMI 27.60 kg/m2  SpO2 97%  Physical Exam  Nursing note and vitals reviewed. Constitutional: She is oriented to person, place, and time. She appears well-developed and well-nourished. No  distress.  HENT:  Head: Normocephalic.  Mouth/Throat: Mucous membranes are normal.  Eyes:       Normal appearance  Neck: Normal range of motion. Neck supple.  Cardiovascular: Normal rate and regular rhythm.  Exam reveals no gallop and no friction rub.   No murmur heard. Pulmonary/Chest: Effort normal. She has no wheezes. She has no rhonchi. She has rales.       Mild rales bilateral lower bases.   Abdominal: Soft. There is no tenderness.  Musculoskeletal: Normal range of motion. She exhibits edema and tenderness.       3+ pitting edema BLE with moderate warmth and redness, edema to L slightly > R  Neurological: She is alert and oriented to person, place, and time.       Motor intact in all extremities  Skin: Skin is warm and dry. No rash noted.       Color normal  Psychiatric: She has a normal mood and affect.    ED Course  Procedures  OTHER DATA REVIEWED: Nursing notes and vital signs reviewed. Prior records reviewed.   LABS / RADIOLOGY: I reviewed all labs and imaging completed today. I personally reviewed the images.   No results found.  Results for orders placed during the hospital encounter of 03/16/11  CBC      Component Value Range   WBC 7.1  4.0 - 10.5 (K/uL)   RBC 3.16 (*) 3.87 - 5.11 (MIL/uL)   Hemoglobin 9.3 (*) 12.0 - 15.0 (g/dL)   HCT 96.0 (*) 45.4 - 46.0 (%)   MCV 93.4  78.0 - 100.0 (fL)   MCH 29.4  26.0 - 34.0 (pg)   MCHC 31.5  30.0 - 36.0 (g/dL)   RDW 09.8  11.9 - 14.7 (%)   Platelets 236  150 - 400 (K/uL)  BASIC METABOLIC PANEL      Component Value Range   Sodium 140  135 - 145 (mEq/L)   Potassium 2.9 (*) 3.5 - 5.1 (mEq/L)   Chloride 92 (*) 96 - 112 (mEq/L)   CO2 43 (*) 19 - 32 (mEq/L)   Glucose, Bld 106 (*) 70 - 99 (mg/dL)   BUN 10  6 - 23 (mg/dL)  Creatinine, Ser 0.52  0.50 - 1.10 (mg/dL)   Calcium 9.6  8.4 - 16.1 (mg/dL)   GFR calc non Af Amer >60  >60 (mL/min)   GFR calc Af Amer >60  >60 (mL/min)  PRO B NATRIURETIC PEPTIDE      Component  Value Range   BNP, POC 525.7 (*) 0 - 125 (pg/mL)   MDM: Bilateral lower extremity with swelling. This appears to be venous stasis without development of venous stasis ulcers. Doubt bilateral DVTs. This is being worked up as an outpatient and has been for approx 4 weeks. No signs of cellulitis at this time. i don't think the pt would benefit from admission tonight as much as she would benefit from follow up with her primary care physician tomorrow. The pt and husband understand and agree with this. Will dc home with pcp followup  IMPRESSION: 1. Lower extremity venous stasis   2. Hypokalemia      PLAN: Discharge results reviewed and discussed with pt, questions answered, pt agreeable with plan.   SCRIBE ATTESTATION: I personally performed the services described in this documentation, which was scribed in my presence. The recorded information has been reviewed and considered. Lyanne Co, MD          Lyanne Co, MD 03/17/11 [phone]

## 2011-03-16 NOTE — ED Notes (Signed)
Patient c/o bilateral leg swelling; states has been going on since July.  States her MD changed her "fluid" medicine to twice daily.

## 2011-03-16 NOTE — ED Notes (Signed)
CRITICAL VALUE ALERT  Critical value received:  CO2  Date of notification:  03/16/11  Time of notification:  23:27  Critical value read back:yes  Nurse who received alert:  Neldon Mc RN  MD notified (1st page):  23:28  Time of first page:  23:28  MD notified (2nd page):  Time of second page:  Responding MD:  Haywood Lasso MD  Time MD responded:  23:28

## 2011-03-17 MED ORDER — POTASSIUM CHLORIDE ER 10 MEQ PO TBCR: 10.0000 meq | EXTENDED_RELEASE_TABLET | Freq: Two times a day (BID) | ORAL | Status: DC

## 2011-03-17 MED ORDER — POTASSIUM CHLORIDE 20 MEQ PO PACK
40.0000 meq | PACK | Freq: Once | ORAL | Status: AC
  Administered 2011-03-17: 40 meq via ORAL
  Filled 2011-03-17: qty 2

## 2011-03-23 ENCOUNTER — Other Ambulatory Visit (HOSPITAL_COMMUNITY): Payer: Self-pay | Admitting: Family Medicine

## 2011-03-23 ENCOUNTER — Ambulatory Visit (HOSPITAL_COMMUNITY)
Admission: RE | Admit: 2011-03-23 | Discharge: 2011-03-23 | Disposition: A | Discharge status: Home / Self Care | Payer: Medicaid Other | Source: Ambulatory Visit | Attending: Family Medicine | Admitting: Family Medicine

## 2011-03-23 DIAGNOSIS — R05 Cough: Secondary | ICD-10-CM

## 2011-03-23 DIAGNOSIS — I10 Essential (primary) hypertension: Secondary | ICD-10-CM | POA: Insufficient documentation

## 2011-03-23 DIAGNOSIS — E119 Type 2 diabetes mellitus without complications: Secondary | ICD-10-CM | POA: Insufficient documentation

## 2011-03-23 DIAGNOSIS — I509 Heart failure, unspecified: Secondary | ICD-10-CM

## 2011-03-23 NOTE — Progress Notes (Signed)
*  PRELIMINARY RESULTS* Echocardiogram 2D Echocardiogram has been performed.  Kara [name] 03/23/2011, 3:34 PM

## 2011-04-02 ENCOUNTER — Emergency Department (HOSPITAL_COMMUNITY)
Admission: EM | Admit: 2011-04-02 | Discharge: 2011-04-03 | Disposition: A | Discharge status: Home / Self Care | Payer: Medicaid Other | Attending: Emergency Medicine | Admitting: Emergency Medicine

## 2011-04-02 ENCOUNTER — Emergency Department (HOSPITAL_COMMUNITY): Payer: Medicaid Other

## 2011-04-02 DIAGNOSIS — R609 Edema, unspecified: Secondary | ICD-10-CM | POA: Insufficient documentation

## 2011-04-02 DIAGNOSIS — J45909 Unspecified asthma, uncomplicated: Secondary | ICD-10-CM | POA: Insufficient documentation

## 2011-04-02 DIAGNOSIS — S7000XA Contusion of unspecified hip, initial encounter: Secondary | ICD-10-CM | POA: Insufficient documentation

## 2011-04-02 DIAGNOSIS — Z87891 Personal history of nicotine dependence: Secondary | ICD-10-CM | POA: Insufficient documentation

## 2011-04-02 DIAGNOSIS — J449 Chronic obstructive pulmonary disease, unspecified: Secondary | ICD-10-CM | POA: Insufficient documentation

## 2011-04-02 DIAGNOSIS — J4489 Other specified chronic obstructive pulmonary disease: Secondary | ICD-10-CM | POA: Insufficient documentation

## 2011-04-02 DIAGNOSIS — R7989 Other specified abnormal findings of blood chemistry: Secondary | ICD-10-CM | POA: Insufficient documentation

## 2011-04-02 DIAGNOSIS — T148XXA Other injury of unspecified body region, initial encounter: Secondary | ICD-10-CM

## 2011-04-02 DIAGNOSIS — E119 Type 2 diabetes mellitus without complications: Secondary | ICD-10-CM | POA: Insufficient documentation

## 2011-04-02 DIAGNOSIS — E876 Hypokalemia: Secondary | ICD-10-CM | POA: Insufficient documentation

## 2011-04-02 DIAGNOSIS — W1811XA Fall from or off toilet without subsequent striking against object, initial encounter: Secondary | ICD-10-CM | POA: Insufficient documentation

## 2011-04-02 DIAGNOSIS — Y92009 Unspecified place in unspecified non-institutional (private) residence as the place of occurrence of the external cause: Secondary | ICD-10-CM | POA: Insufficient documentation

## 2011-04-02 DIAGNOSIS — I509 Heart failure, unspecified: Secondary | ICD-10-CM | POA: Insufficient documentation

## 2011-04-02 DIAGNOSIS — Z9181 History of falling: Secondary | ICD-10-CM

## 2011-04-02 DIAGNOSIS — M129 Arthropathy, unspecified: Secondary | ICD-10-CM | POA: Insufficient documentation

## 2011-04-02 LAB — DIFFERENTIAL
Basophils Absolute: 0 10*3/uL (ref 0.0–0.1)
Basophils Relative: 1 % (ref 0–1)
Eosinophils Relative: 2 % (ref 0–5)
Monocytes Absolute: 0.8 10*3/uL (ref 0.1–1.0)
Neutro Abs: 3.9 10*3/uL (ref 1.7–7.7)

## 2011-04-02 LAB — CBC
HCT: 28.8 % — ABNORMAL LOW (ref 36.0–46.0)
MCHC: 31.3 g/dL (ref 30.0–36.0)
MCV: 94.4 fL (ref 78.0–100.0)
RDW: 12.5 % (ref 11.5–15.5)

## 2011-04-02 MED ORDER — ONDANSETRON HCL 4 MG/2ML IJ SOLN
4.0000 mg | Freq: Once | INTRAMUSCULAR | Status: AC
  Administered 2011-04-03: 4 mg via INTRAVENOUS
  Filled 2011-04-02: qty 2

## 2011-04-02 MED ORDER — MORPHINE SULFATE 4 MG/ML IJ SOLN
4.0000 mg | Freq: Once | INTRAMUSCULAR | Status: AC
  Administered 2011-04-03: 4 mg via INTRAVENOUS
  Filled 2011-04-02: qty 1

## 2011-04-02 NOTE — ED Notes (Signed)
Pt has bruising and abrasion to the left knee and buttock.   Pt lives alone, nerighbor says pt has a habit of falling asleep on the commode and falling into the floor.

## 2011-04-02 NOTE — ED Notes (Signed)
Pt very drowsy while being assessed.

## 2011-04-02 NOTE — ED Notes (Signed)
Pt called neighbors after she fell at home,  States she has been falling a lot recently, now with pain to right hip and also c/o swelling and increased redness to both lower legs.

## 2011-04-03 ENCOUNTER — Other Ambulatory Visit: Payer: Self-pay

## 2011-04-03 ENCOUNTER — Emergency Department (HOSPITAL_COMMUNITY): Payer: Medicaid Other

## 2011-04-03 LAB — PRO B NATRIURETIC PEPTIDE: Pro B Natriuretic peptide (BNP): 1335 pg/mL — ABNORMAL HIGH (ref 0–125)

## 2011-04-03 LAB — BASIC METABOLIC PANEL
Calcium: 9 mg/dL (ref 8.4–10.5)
Creatinine, Ser: 1.39 mg/dL — ABNORMAL HIGH (ref 0.50–1.10)
GFR calc Af Amer: 46 mL/min — ABNORMAL LOW (ref >60)
GFR calc non Af Amer: 38 mL/min — ABNORMAL LOW (ref >60)

## 2011-04-03 MED ORDER — POTASSIUM CHLORIDE 20 MEQ PO PACK
40.0000 meq | PACK | Freq: Once | ORAL | Status: AC
  Administered 2011-04-03: 40 meq via ORAL
  Filled 2011-04-03: qty 2

## 2011-04-03 NOTE — ED Notes (Signed)
Pt ambulated to the restroom without difficulty, pt O2 saturation decreased to 57.  Placed on O2 at 3LPM

## 2011-04-03 NOTE — ED Provider Notes (Signed)
History     CSN: [id number] Arrival date & time: 04/02/2011  6:47 PM  Chief Complaint  Patient presents with  . Fall    Frequent Falls   Patient is a 65 y.o. female presenting with fall. The history is provided by the patient and the spouse.  Fall The accident occurred 6 to 12 hours ago. Incident: while getting up from the camode. She fell from a height of 1 to 2 ft. She landed on a hard floor. The point of impact was the left knee. The pain is present in the left hip and left knee. The pain is moderate. She was ambulatory at the scene. Pertinent negatives include no fever, no abdominal pain, no bowel incontinence, no vomiting, no headaches, no loss of consciousness and no tingling. The symptoms are aggravated by activity. She has tried nothing for the symptoms.    Past Medical History  Diagnosis Date  . CHF (congestive heart failure)   . Diabetes mellitus   . Arthritis   . Asthma   . COPD (chronic obstructive pulmonary disease)     Past Surgical History  Procedure Date  . Cholecystectomy     No family history on file.  History  Substance Use Topics  . Smoking status: Former Games developer  . Smokeless tobacco: Not on file  . Alcohol Use: No    OB History    Grav Para Term Preterm Abortions TAB SAB Ect Mult Living                  Review of Systems  Constitutional: Negative for fever and chills.  HENT: Negative for neck pain and neck stiffness.   Eyes: Negative for pain.  Respiratory:       Some SOB is on 3L O2 at home and has inc ankle edema despite inc lasix  Cardiovascular: Negative for chest pain.  Gastrointestinal: Negative for vomiting, abdominal pain and bowel incontinence.  Genitourinary: Negative for dysuria.  Musculoskeletal: Negative for back pain.  Skin: Negative for rash.  Neurological: Negative for tingling, loss of consciousness and headaches.  All other systems reviewed and are negative.    Physical Exam  BP 112/39  Pulse 76  Temp(Src) 98.9 F  (37.2 C) (Oral)  Resp 16  SpO2 95%  Physical Exam  Constitutional: She is oriented to person, place, and time. She appears well-developed and well-nourished.  HENT:  Head: Normocephalic and atraumatic.  Eyes: Conjunctivae and EOM are normal. Pupils are equal, round, and reactive to light.  Neck: Trachea normal. Neck supple. No thyromegaly present.  Cardiovascular: Normal rate, regular rhythm, S1 normal, S2 normal and normal pulses.     No systolic murmur is present   No diastolic murmur is present  Pulses:      Radial pulses are 2+ on the right side, and 2+ on the left side.  Pulmonary/Chest: Effort normal and breath sounds normal. She has no wheezes. She has no rhonchi. She has no rales. She exhibits no tenderness.  Abdominal: Soft. Normal appearance and bowel sounds are normal. There is no tenderness. There is no CVA tenderness and negative Murphy's sign.  Musculoskeletal:       BLE:s Calves nontender, no cords or erythema, negative Homans sign. 2-3 plus pitting edema symetric to BLEs. L knee TTP with mild ecchymosis and TTP over L greater trochanter with l;arge hematoma to posterior hip, distal N/V intact.   Neurological: She is alert and oriented to person, place, and time. She has normal strength. No cranial  nerve deficit or sensory deficit. GCS eye subscore is 4. GCS verbal subscore is 5. GCS motor subscore is 6.  Skin: Skin is warm and dry. No rash noted. She is not diaphoretic.  Psychiatric: Her speech is normal.       Cooperative and appropriate    ED Course  Procedures  MDM  Fall from camode PTA with L hip pain. IV narcotics in ED and plain films obtained/ reviewed. PT has chronic LE edema on lasix, found to be hypokalemic and potassium given in ED. PT ambulates and able to bear wt. Plan discharge home with close PCP follow up fpr recheck fall/ hip apin/ hematoma and also follow up peripheral edema and low potassium  Results for orders placed during the hospital encounter  of 04/02/11 (from the past 24 hour(s))  CBC     Status: Abnormal   Collection Time   04/02/11 11:30 PM      Component Value Range   WBC 6.0  4.0 - 10.5 (K/uL)   RBC 3.05 (*) 3.87 - 5.11 (MIL/uL)   Hemoglobin 9.0 (*) 12.0 - 15.0 (g/dL)   HCT 16.1 (*) 09.6 - 46.0 (%)   MCV 94.4  78.0 - 100.0 (fL)   MCH 29.5  26.0 - 34.0 (pg)   MCHC 31.3  30.0 - 36.0 (g/dL)   RDW 04.5  40.9 - 81.1 (%)   Platelets 267  150 - 400 (K/uL)  DIFFERENTIAL     Status: Abnormal   Collection Time   04/02/11 11:30 PM      Component Value Range   Neutrophils Relative 65  43 - 77 (%)   Neutro Abs 3.9  1.7 - 7.7 (K/uL)   Lymphocytes Relative 20  12 - 46 (%)   Lymphs Abs 1.2  0.7 - 4.0 (K/uL)   Monocytes Relative 13 (*) 3 - 12 (%)   Monocytes Absolute 0.8  0.1 - 1.0 (K/uL)   Eosinophils Relative 2  0 - 5 (%)   Eosinophils Absolute 0.1  0.0 - 0.7 (K/uL)   Basophils Relative 1  0 - 1 (%)   Basophils Absolute 0.0  0.0 - 0.1 (K/uL)  BASIC METABOLIC PANEL     Status: Abnormal   Collection Time   04/02/11 11:30 PM      Component Value Range   Sodium 138  135 - 145 (mEq/L)   Potassium 2.7 (*) 3.5 - 5.1 (mEq/L)   Chloride 88 (*) 96 - 112 (mEq/L)   CO2 46 (*) 19 - 32 (mEq/L)   Glucose, Bld 97  70 - 99 (mg/dL)   BUN 17  6 - 23 (mg/dL)   Creatinine, Ser 9.14 (*) 0.50 - 1.10 (mg/dL)   Calcium 9.0  8.4 - 78.2 (mg/dL)   GFR calc non Af Amer 38 (*) >60 (mL/min)   GFR calc Af Amer 46 (*) >60 (mL/min)  PRO B NATRIURETIC PEPTIDE     Status: Abnormal   Collection Time   04/02/11 11:31 PM      Component Value Range   BNP, POC 1335.0 (*) 0 - 125 (pg/mL)  TROPONIN I     Status: Normal   Collection Time   04/02/11 11:31 PM      Component Value Range   Troponin I <0.30  <0.30 (ng/mL)      Sunnie Nielsen, MD 04/03/11 [phone]

## 2011-04-13 ENCOUNTER — Encounter (HOSPITAL_COMMUNITY): Payer: Self-pay

## 2011-04-13 ENCOUNTER — Encounter: Payer: Self-pay | Admitting: Cardiology

## 2011-04-13 ENCOUNTER — Encounter (HOSPITAL_COMMUNITY): Payer: Self-pay | Admitting: *Deleted

## 2011-04-13 ENCOUNTER — Emergency Department (HOSPITAL_COMMUNITY): Payer: Medicaid Other

## 2011-04-13 ENCOUNTER — Ambulatory Visit (INDEPENDENT_AMBULATORY_CARE_PROVIDER_SITE_OTHER): Payer: Medicaid Other | Admitting: Cardiology

## 2011-04-13 ENCOUNTER — Other Ambulatory Visit: Payer: Self-pay

## 2011-04-13 ENCOUNTER — Inpatient Hospital Stay (HOSPITAL_COMMUNITY)
Admission: EM | Admit: 2011-04-13 | Discharge: 2011-04-20 | DRG: 603 | Disposition: A | Discharge status: Home Health | Payer: Medicaid Other | Attending: Family Medicine | Admitting: Family Medicine

## 2011-04-13 VITALS — BP 82/49 | HR 62 | Resp 18 | Ht 66.0 in | Wt 177.0 lb

## 2011-04-13 DIAGNOSIS — K219 Gastro-esophageal reflux disease without esophagitis: Secondary | ICD-10-CM | POA: Diagnosis present

## 2011-04-13 DIAGNOSIS — E039 Hypothyroidism, unspecified: Secondary | ICD-10-CM | POA: Diagnosis present

## 2011-04-13 DIAGNOSIS — L0390 Cellulitis, unspecified: Secondary | ICD-10-CM

## 2011-04-13 DIAGNOSIS — K589 Irritable bowel syndrome without diarrhea: Secondary | ICD-10-CM | POA: Diagnosis present

## 2011-04-13 DIAGNOSIS — L03119 Cellulitis of unspecified part of limb: Principal | ICD-10-CM | POA: Diagnosis present

## 2011-04-13 DIAGNOSIS — I10 Essential (primary) hypertension: Secondary | ICD-10-CM

## 2011-04-13 DIAGNOSIS — I2510 Atherosclerotic heart disease of native coronary artery without angina pectoris: Secondary | ICD-10-CM

## 2011-04-13 DIAGNOSIS — L02419 Cutaneous abscess of limb, unspecified: Principal | ICD-10-CM | POA: Diagnosis present

## 2011-04-13 DIAGNOSIS — J449 Chronic obstructive pulmonary disease, unspecified: Secondary | ICD-10-CM

## 2011-04-13 DIAGNOSIS — R002 Palpitations: Secondary | ICD-10-CM | POA: Insufficient documentation

## 2011-04-13 DIAGNOSIS — N289 Disorder of kidney and ureter, unspecified: Secondary | ICD-10-CM

## 2011-04-13 DIAGNOSIS — E785 Hyperlipidemia, unspecified: Secondary | ICD-10-CM | POA: Diagnosis present

## 2011-04-13 DIAGNOSIS — I509 Heart failure, unspecified: Secondary | ICD-10-CM | POA: Diagnosis present

## 2011-04-13 DIAGNOSIS — J4489 Other specified chronic obstructive pulmonary disease: Secondary | ICD-10-CM | POA: Diagnosis present

## 2011-04-13 DIAGNOSIS — R600 Localized edema: Secondary | ICD-10-CM

## 2011-04-13 DIAGNOSIS — E876 Hypokalemia: Secondary | ICD-10-CM

## 2011-04-13 DIAGNOSIS — D649 Anemia, unspecified: Secondary | ICD-10-CM | POA: Diagnosis present

## 2011-04-13 DIAGNOSIS — R609 Edema, unspecified: Secondary | ICD-10-CM

## 2011-04-13 DIAGNOSIS — E119 Type 2 diabetes mellitus without complications: Secondary | ICD-10-CM | POA: Diagnosis present

## 2011-04-13 DIAGNOSIS — I5030 Unspecified diastolic (congestive) heart failure: Secondary | ICD-10-CM | POA: Diagnosis present

## 2011-04-13 LAB — URINALYSIS, ROUTINE W REFLEX MICROSCOPIC
Bilirubin Urine: NEGATIVE
Glucose, UA: NEGATIVE mg/dL
Hgb urine dipstick: NEGATIVE
Protein, ur: NEGATIVE mg/dL

## 2011-04-13 LAB — CBC
HCT: 29 % — ABNORMAL LOW (ref 36.0–46.0)
MCH: 28.2 pg (ref 26.0–34.0)
MCHC: 30.3 g/dL (ref 30.0–36.0)
MCV: 92.9 fL (ref 78.0–100.0)
Platelets: 345 10*3/uL (ref 150–400)
RDW: 12.7 % (ref 11.5–15.5)

## 2011-04-13 LAB — GLUCOSE, CAPILLARY

## 2011-04-13 LAB — BASIC METABOLIC PANEL
CO2: 40 mEq/L (ref 19–32)
Calcium: 9.6 mg/dL (ref 8.4–10.5)
Creatinine, Ser: 2.13 mg/dL — ABNORMAL HIGH (ref 0.50–1.10)
GFR calc Af Amer: 28 mL/min — ABNORMAL LOW (ref >60)
GFR calc non Af Amer: 23 mL/min — ABNORMAL LOW (ref >60)
Sodium: 135 mEq/L (ref 135–145)

## 2011-04-13 LAB — DIFFERENTIAL
Basophils Absolute: 0 10*3/uL (ref 0.0–0.1)
Basophils Relative: 0 % (ref 0–1)
Eosinophils Absolute: 0.2 10*3/uL (ref 0.0–0.7)
Eosinophils Relative: 2 % (ref 0–5)
Monocytes Absolute: 0.9 10*3/uL (ref 0.1–1.0)

## 2011-04-13 LAB — PRO B NATRIURETIC PEPTIDE: Pro B Natriuretic peptide (BNP): 2182 pg/mL — ABNORMAL HIGH (ref 0–125)

## 2011-04-13 MED ORDER — LUBIPROSTONE 24 MCG PO CAPS
24.0000 ug | ORAL_CAPSULE | Freq: Two times a day (BID) | ORAL | Status: DC
  Administered 2011-04-14 – 2011-04-20 (×12): 24 ug via ORAL
  Filled 2011-04-13 (×17): qty 1

## 2011-04-13 MED ORDER — CHLORHEXIDINE GLUCONATE 0.12 % MT SOLN
15.0000 mL | Freq: Two times a day (BID) | OROMUCOSAL | Status: DC
  Administered 2011-04-13 – 2011-04-14 (×3): 15 mL via OROMUCOSAL
  Filled 2011-04-13 (×3): qty 15

## 2011-04-13 MED ORDER — ENOXAPARIN SODIUM 40 MG/0.4ML ~~LOC~~ SOLN
40.0000 mg | SUBCUTANEOUS | Status: DC
  Administered 2011-04-14 – 2011-04-19 (×6): 40 mg via SUBCUTANEOUS
  Filled 2011-04-13 (×6): qty 0.4

## 2011-04-13 MED ORDER — CITALOPRAM HYDROBROMIDE 20 MG PO TABS
20.0000 mg | ORAL_TABLET | Freq: Two times a day (BID) | ORAL | Status: DC
  Administered 2011-04-14 – 2011-04-20 (×14): 20 mg via ORAL
  Filled 2011-04-13 (×14): qty 1

## 2011-04-13 MED ORDER — BUSPIRONE HCL 5 MG PO TABS
10.0000 mg | ORAL_TABLET | Freq: Every day | ORAL | Status: DC
  Administered 2011-04-14 – 2011-04-20 (×7): 10 mg via ORAL
  Filled 2011-04-13: qty 1
  Filled 2011-04-13 (×2): qty 2
  Filled 2011-04-13: qty 1
  Filled 2011-04-13 (×4): qty 2

## 2011-04-13 MED ORDER — GUAIFENESIN 200 MG PO TABS
400.0000 mg | ORAL_TABLET | Freq: Two times a day (BID) | ORAL | Status: DC
  Filled 2011-04-13: qty 2

## 2011-04-13 MED ORDER — BIOTENE DRY MOUTH MT LIQD
Freq: Two times a day (BID) | OROMUCOSAL | Status: DC
  Administered 2011-04-14 – 2011-04-18 (×7): via OROMUCOSAL
  Administered 2011-04-19: 1 via OROMUCOSAL
  Administered 2011-04-19: 16:00:00 via OROMUCOSAL

## 2011-04-13 MED ORDER — METOPROLOL TARTRATE 25 MG PO TABS
25.0000 mg | ORAL_TABLET | Freq: Two times a day (BID) | ORAL | Status: DC
  Administered 2011-04-14 (×2): 25 mg via ORAL
  Filled 2011-04-13 (×2): qty 1

## 2011-04-13 MED ORDER — FENOFIBRATE 160 MG PO TABS
160.0000 mg | ORAL_TABLET | Freq: Every day | ORAL | Status: DC
  Administered 2011-04-14 – 2011-04-20 (×7): 160 mg via ORAL
  Filled 2011-04-13 (×10): qty 1

## 2011-04-13 MED ORDER — METFORMIN HCL 500 MG PO TABS
1000.0000 mg | ORAL_TABLET | Freq: Two times a day (BID) | ORAL | Status: DC
  Administered 2011-04-14 (×2): 1000 mg via ORAL
  Filled 2011-04-13: qty 1
  Filled 2011-04-13: qty 2
  Filled 2011-04-13: qty 1

## 2011-04-13 MED ORDER — FUROSEMIDE 10 MG/ML IJ SOLN
40.0000 mg | Freq: Two times a day (BID) | INTRAMUSCULAR | Status: DC
  Administered 2011-04-14 – 2011-04-17 (×8): 40 mg via INTRAVENOUS
  Filled 2011-04-13 (×8): qty 4

## 2011-04-13 MED ORDER — NITROGLYCERIN 0.4 MG SL SUBL: 0.4000 mg | SUBLINGUAL_TABLET | SUBLINGUAL | Status: DC | PRN

## 2011-04-13 MED ORDER — VANCOMYCIN HCL IN DEXTROSE 1-5 GM/200ML-% IV SOLN
1000.0000 mg | Freq: Once | INTRAVENOUS | Status: AC
  Administered 2011-04-13: 1000 mg via INTRAVENOUS
  Filled 2011-04-13: qty 200

## 2011-04-13 MED ORDER — SODIUM CHLORIDE 0.9 % IV SOLN: INTRAVENOUS | Status: DC

## 2011-04-13 MED ORDER — DILTIAZEM HCL ER COATED BEADS 180 MG PO CP24
300.0000 mg | ORAL_CAPSULE | Freq: Every day | ORAL | Status: DC
  Administered 2011-04-14: 300 mg via ORAL
  Filled 2011-04-13: qty 1

## 2011-04-13 MED ORDER — MORPHINE SULFATE CR 30 MG PO TB12
30.0000 mg | ORAL_TABLET | Freq: Three times a day (TID) | ORAL | Status: DC
  Administered 2011-04-14 – 2011-04-20 (×18): 30 mg via ORAL
  Filled 2011-04-13 (×18): qty 1

## 2011-04-13 MED ORDER — LEVOTHYROXINE SODIUM 100 MCG PO TABS
100.0000 ug | ORAL_TABLET | Freq: Every day | ORAL | Status: DC
  Administered 2011-04-14 – 2011-04-20 (×7): 100 ug via ORAL
  Filled 2011-04-13 (×7): qty 1

## 2011-04-13 MED ORDER — PANTOPRAZOLE SODIUM 40 MG PO TBEC
80.0000 mg | DELAYED_RELEASE_TABLET | Freq: Every day | ORAL | Status: DC
  Administered 2011-04-14 – 2011-04-19 (×6): 80 mg via ORAL
  Filled 2011-04-13 (×4): qty 2
  Filled 2011-04-13: qty 1
  Filled 2011-04-13: qty 2

## 2011-04-13 MED ORDER — FUROSEMIDE 10 MG/ML IJ SOLN
40.0000 mg | Freq: Two times a day (BID) | INTRAMUSCULAR | Status: DC
  Administered 2011-04-13: 40 mg via INTRAVENOUS
  Filled 2011-04-13: qty 4

## 2011-04-13 MED ORDER — ZOLPIDEM TARTRATE 5 MG PO TABS
20.0000 mg | ORAL_TABLET | Freq: Every day | ORAL | Status: DC
  Administered 2011-04-14: 10 mg via ORAL
  Filled 2011-04-13: qty 4

## 2011-04-13 MED ORDER — GABAPENTIN 300 MG PO CAPS
300.0000 mg | ORAL_CAPSULE | Freq: Two times a day (BID) | ORAL | Status: DC
  Administered 2011-04-14 – 2011-04-20 (×14): 300 mg via ORAL
  Filled 2011-04-13 (×14): qty 1

## 2011-04-13 MED ORDER — POTASSIUM CHLORIDE CRYS ER 10 MEQ PO TBCR
10.0000 meq | EXTENDED_RELEASE_TABLET | Freq: Two times a day (BID) | ORAL | Status: DC
  Administered 2011-04-14 – 2011-04-16 (×7): 10 meq via ORAL
  Filled 2011-04-13 (×7): qty 1

## 2011-04-13 MED ORDER — SODIUM CHLORIDE 0.9 % IV SOLN
INTRAVENOUS | Status: DC
  Administered 2011-04-13: 17:00:00 via INTRAVENOUS
  Administered 2011-04-14: 1000 mL via INTRAVENOUS

## 2011-04-13 MED ORDER — POLYETHYLENE GLYCOL 3350 17 G PO PACK
17.0000 g | PACK | Freq: Two times a day (BID) | ORAL | Status: DC
  Administered 2011-04-14: 17 g via ORAL
  Administered 2011-04-14 (×2): via ORAL
  Administered 2011-04-15 (×2): 17 g via ORAL
  Administered 2011-04-16: 22:00:00 via ORAL
  Administered 2011-04-16 – 2011-04-20 (×8): 17 g via ORAL
  Filled 2011-04-13 (×14): qty 1

## 2011-04-13 MED ORDER — FUROSEMIDE 40 MG PO TABS: 40.0000 mg | ORAL_TABLET | Freq: Two times a day (BID) | ORAL | Status: DC

## 2011-04-13 NOTE — ED Provider Notes (Addendum)
History   Scribed for Kara Jakes, MD, the patient was seen in room APA01/APA01. This chart was scribed by Clarita Crane. This patient's care was started at 4:05PM.  CSN: [id number] Arrival date & time: 04/13/2011  3:36 PM  Chief Complaint  Patient presents with  . Chest Pain  . Shortness of Breath   HPI   HPI Kara [name] is a 65 y.o. female who presents to the Emergency Department complaining of increased moderate to severe swelling to bilateral lower extremities with associated diffuse erythema to bilateral lower legs and left knee onset 2-3 months ago and persistent since. Patient reports she had an appointment with Dr. Diona Browner at [facility] this morning with EKG and echocardiogram performed and was referred to ED for further evaluation and possible admission due to increased swelling of lower extremities and diffuse erythema. Patient also c/o constant substernal chest pain onset 3.5 hours ago and perisistent since with associated SOB and weakness. Patient reports having intermittent episodes of chest pain for the past 2 months which has been followed by Dr. Renard Matter since onset and states current chest pain is similar to that previously experienced. Denies abdominal pain, nausea, vomiting, HA, diaphoresis.   PCP- Dr. Renard Matter  HPI ELEMENTS: Location: bilateral lower extremities Onset: 2-3 months ago Duration: worseningsince onset  Timing: constant  Severity: moderate to severe Context:  as above  Associated symptoms: +erythema.  Denies abdominal pain, nausea, vomiting, HA, diaphoresis.  Also c/o substernal chest pain with SOB and weakness.   PAST MEDICAL HISTORY:  Past Medical History  Diagnosis Date  . Type 2 diabetes mellitus   . Arthritis   . COPD (chronic obstructive pulmonary disease)     Home oxygen  . Palpitations   . Coronary atherosclerosis of native coronary artery     Nonobstructive at catheterization 2008  . GERD (gastroesophageal reflux disease)     . Irritable bowel syndrome   . Chronic anemia   . Hypothyroidism   . Hyperlipidemia   . Chronic edema   . Pneumonia   . Diabetes mellitus   . Hypertension     PAST SURGICAL HISTORY:  Past Surgical History  Procedure Date  . Cholecystectomy     FAMILY HISTORY:  Family History  Problem Relation Age of Onset  . Hypertension       SOCIAL HISTORY: History   Social History  . Marital Status: Divorced    Spouse Name: N/A    Number of Children: N/A  . Years of Education: N/A   Social History Main Topics  . Smoking status: Former Smoker    Types: Cigarettes  . Smokeless tobacco: Never Used  . Alcohol Use: No  . Drug Use: No  . Sexually Active: None   Other Topics Concern  . None   Social History Narrative  . None    Review of Systems  Review of Systems  Constitutional: Negative for fever.  HENT: Negative for rhinorrhea.   Eyes: Negative for pain.  Respiratory: Positive for shortness of breath. Negative for cough.   Cardiovascular: Positive for chest pain.  Gastrointestinal: Negative for nausea, vomiting, abdominal pain and diarrhea.  Genitourinary: Negative for dysuria.  Musculoskeletal: Negative for back pain.       Increased swelling of bilateral lower extremities.   Skin:       Erythema  Neurological: Positive for weakness. Negative for headaches.    Allergies  Codeine  Home Medications   Current Outpatient Rx  Name Route Sig Dispense Refill  .  ALPRAZOLAM 1 MG PO TABS Oral Take 1 mg by mouth 3 (three) times daily as needed. For anxiety    . BUSPIRONE HCL 10 MG PO TABS Oral Take 10 mg by mouth daily.      . CHOLINE FENOFIBRATE 135 MG PO CPDR Oral Take 135 mg by mouth daily.      [name] CITALOPRAM HYDROBROMIDE 20 MG PO TABS Oral Take 20 mg by mouth 2 (two) times daily.     . DEXLANSOPRAZOLE 60 MG PO CPDR Oral Take 60 mg by mouth daily.      [name] DILTIAZEM HCL COATED BEADS 300 MG PO CP24 Oral Take 300 mg by mouth daily.      . FUROSEMIDE 20 MG PO TABS Oral  Take 20 mg by mouth 2 (two) times daily.      . FUROSEMIDE 40 MG PO TABS Oral Take 40 mg by mouth 3 (three) times daily.      [name] GABAPENTIN 300 MG PO CAPS Oral Take 300 mg by mouth 2 (two) times daily. 2 qam and 2 qpm    . GUAIFENESIN 200 MG PO TABS Oral Take 400 mg by mouth 2 (two) times daily.     [name] LEVOTHYROXINE SODIUM 100 MCG PO TABS Oral Take 100 mcg by mouth daily.      . LUBIPROSTONE 24 MCG PO CAPS Oral Take 24 mcg by mouth 2 (two) times daily with a meal.     . METFORMIN HCL 1000 MG PO TABS Oral Take 1,000 mg by mouth 2 (two) times daily with a meal.      . METOLAZONE 5 MG PO TABS Oral Take 10 mg by mouth daily.      [name] METOPROLOL TARTRATE 25 MG PO TABS Oral Take 25 mg by mouth 2 (two) times daily.      . MORPHINE SULFATE 30 MG PO TB12 Oral Take 30 mg by mouth 3 (three) times daily. To treat pain associated with 3 bulging disc in back    . NITROGLYCERIN 0.4 MG SL SUBL Sublingual Place 0.4 mg under the tongue every 5 (five) minutes as needed.      [name] POLYETHYLENE GLYCOL 3350 PO POWD Oral Take 17 g by mouth 2 (two) times daily.      [name] POTASSIUM CHLORIDE CR 10 MEQ PO TBCR Oral Take 1 tablet (10 mEq total) by mouth 2 (two) times daily. 8 tablet 0  . ZOLPIDEM TARTRATE 10 MG PO TABS Oral Take 20 mg by mouth at bedtime.     . ACETAMINOPHEN 500 MG PO TABS Oral Take 500 mg by mouth daily as needed. For pain     . HYOSCYAMINE PO Oral Take 1 tablet by mouth as needed. For GI tract    . LEVOTHYROXINE SODIUM PO Oral Take 1 tablet by mouth daily.        Physical Exam    BP 92/46  Pulse 61  Temp(Src) 98 F (36.7 C) (Oral)  Resp 22  Ht 5\' 6"  (1.676 m)  Wt 177 lb (80.287 kg)  BMI 28.57 kg/m2  SpO2 100%  Physical Exam  Nursing note and vitals reviewed. Constitutional: She is oriented to person, place, and time. She appears well-developed and well-nourished. No distress.  HENT:  Head: Normocephalic and atraumatic.  Eyes: EOM are normal. Pupils are equal, round, and reactive to light.  Neck:  Neck supple.  Cardiovascular: Normal rate and regular rhythm.  Exam reveals no gallop and no friction rub.   No murmur heard. Pulmonary/Chest:  Effort normal and breath sounds normal. She has no wheezes.  Abdominal: Soft. Bowel sounds are normal. She exhibits no distension. There is no tenderness.  Musculoskeletal: Normal range of motion. She exhibits edema.       Significant pitting edema of bilateral lower extremities.   Neurological: She is alert and oriented to person, place, and time. No cranial nerve deficit or sensory deficit.  Skin: Skin is warm and dry.       Diffuse erythema to bilateral lower legs and left knee with increased warmth to touch over erythematous areas.  Psychiatric: She has a normal mood and affect. Her behavior is normal.    ED Course  Procedures  OTHER DATA REVIEWED: Nursing notes, vital signs, and past medical records reviewed. Lab results reviewed and considered Imaging results reviewed and considered  DIAGNOSTIC STUDIES: Oxygen Saturation is 98% on room air, normal by my interpretation.    LABS / RADIOLOGY: Results for orders placed during the hospital encounter of 04/13/11  CBC      Component Value Range   WBC 7.5  4.0 - 10.5 (K/uL)   RBC 3.12 (*) 3.87 - 5.11 (MIL/uL)   Hemoglobin 8.8 (*) 12.0 - 15.0 (g/dL)   HCT 16.1 (*) 09.6 - 46.0 (%)   MCV 92.9  78.0 - 100.0 (fL)   MCH 28.2  26.0 - 34.0 (pg)   MCHC 30.3  30.0 - 36.0 (g/dL)   RDW 04.5  40.9 - 81.1 (%)   Platelets 345  150 - 400 (K/uL)  DIFFERENTIAL      Component Value Range   Neutrophils Relative 68  43 - 77 (%)   Neutro Abs 5.1  1.7 - 7.7 (K/uL)   Lymphocytes Relative 18  12 - 46 (%)   Lymphs Abs 1.3  0.7 - 4.0 (K/uL)   Monocytes Relative 12  3 - 12 (%)   Monocytes Absolute 0.9  0.1 - 1.0 (K/uL)   Eosinophils Relative 2  0 - 5 (%)   Eosinophils Absolute 0.2  0.0 - 0.7 (K/uL)   Basophils Relative 0  0 - 1 (%)   Basophils Absolute 0.0  0.0 - 0.1 (K/uL)  BASIC METABOLIC PANEL       Component Value Range   Sodium 135  135 - 145 (mEq/L)   Potassium 3.4 (*) 3.5 - 5.1 (mEq/L)   Chloride 86 (*) 96 - 112 (mEq/L)   CO2 40 (*) 19 - 32 (mEq/L)   Glucose, Bld 156 (*) 70 - 99 (mg/dL)   BUN 29 (*) 6 - 23 (mg/dL)   Creatinine, Ser 9.14 (*) 0.50 - 1.10 (mg/dL)   Calcium 9.6  8.4 - 78.2 (mg/dL)   GFR calc non Af Amer 23 (*) >60 (mL/min)   GFR calc Af Amer 28 (*) >60 (mL/min)  PRO B NATRIURETIC PEPTIDE      Component Value Range   BNP, POC 2182.0 (*) 0 - 125 (pg/mL)  POCT I-STAT TROPONIN I      Component Value Range   Troponin i, poc 0.00  0.00 - 0.08 (ng/mL)   Comment 3            Dg Chest Portable 1 View  04/13/2011  *RADIOLOGY REPORT*  Clinical Data: Shortness of breath, bilateral lower extremity edema, redness, warmth  PORTABLE CHEST - 1 VIEW  Comparison: Portable exam 1616 hours compared to 04/03/2011  Findings: Enlargement of cardiac silhouette. Tortuous aorta. Pulmonary vascularity normal. Lungs clear. No pleural effusion or pneumothorax. No acute osseous findings.  IMPRESSION: Enlargement of cardiac silhouette. No acute abnormalities.  Original Report Authenticated By: Lollie Marrow, M.D.    Date: 04/13/2011  Rate: 67  Rhythm: normal sinus rhythm and premature atrial contractions (PAC)  QRS Axis: normal  Intervals: normal  ST/T Wave abnormalities: nonspecific T wave changes  Conduction Disutrbances:none  Narrative Interpretation:   Old EKG Reviewed: unchanged FROM: 04/03/11   ED COURSE / COORDINATION OF CARE: Orders Placed This Encounter  Procedures  . DG Chest Portable 1 View  . CBC  . Differential  . Basic metabolic panel  . Pro b natriuretic peptide  . POCT i-Stat troponin I  . ED EKG  5:54PM- Consult complete with Dr. Renard Matter. Patient case explained and discussed. Dr. Renard Matter agrees to admit patient to [facility] for renal insufficiency and cellulitis.  5:59PM- Patient informed of consult with Dr. Renard Matter and intent to admit for renal insufficiency and  cellulitis. Patient agrees with plan set forth at this time.  MDM: BILAT LE CELLULITIS AND EDEMA AND WORSENING RENAL FUNCTION. DW DR [name] HE WILL ADMIT. CARDAIC  WORK UP NEGATIVE.  PLAN: Admit to Tele for Renal insufficiency and Cellulitis  CONDITION ON Admission: Stable  MEDICATIONS GIVEN IN THE E.D.  Medications  busPIRone (BUSPAR) 10 MG tablet (not administered)  acetaminophen (TYLENOL) 500 MG tablet (not administered)  0.9 %  sodium chloride infusion (  Intravenous New Bag 04/13/11 1659)  vancomycin (VANCOCIN) IVPB 1000 mg/200 mL premix (1000 mg Intravenous New Bag 04/13/11 1659)    No diagnosis found.    I personally performed the services described in this documentation, which was scribed in my presence. The recorded information has been reviewed and considered. Kara Jakes, MD     Kara Jakes, MD 04/13/11 Merrily Brittle  Kara Jakes, MD 04/13/11 [phone]

## 2011-04-13 NOTE — Assessment & Plan Note (Signed)
Need to follow up TSH

## 2011-04-13 NOTE — Assessment & Plan Note (Signed)
Nonobstructive disease documented at cardiac catheterization in 2008.

## 2011-04-13 NOTE — Progress Notes (Signed)
Clinical Summary Kara [name] is a 65 y.o.female referred for cardiology consultation by Dr. Renard Matter. She is a former patient of Dr. Daleen Squibb and Dr. Eden Emms, last seen in 2010. Her cardiac history is noted below.  She is here with her sister. She reports a several month history of progressive lower extremity edema. She states that this edema has gradually worsened, is painful with tight feeling in her legs below the knees, at times has had weeping, and is also associated with erythema. She reports undergoing extensive evaluation with Dr. Renard Matter, has been treated with antibiotics, had lower extremity Dopplers that did not demonstrate DVT, and also has been on various combinations of diuretics, with no significant improvement as yet.  Recent lab work from September 7 showed sodium 140, potassium 3.1, BUN 14, creatinine 1.1. Compared to labs from August, potassium has decreased and creatinine has increased.  In review of her cardiac testing, she has had normal to low-normal LVEF, description of normal RV function, and her cardiac catheterization demonstrated mildly increased pulmonary pressures. Possibility of a component of cor pulmonale with her significant lung disease is to be considered. Not certain about her protein status. She has no known history of thromboembolic disease.  She states that she hit her left leg/knee recently and has an abrasion there, looks like early eschar formation.   Allergies  Allergen Reactions  . Codeine Nausea And Vomiting and Rash    Medication list reviewed.  Past Medical History  Diagnosis Date  . Type 2 diabetes mellitus   . Arthritis   . COPD (chronic obstructive pulmonary disease)     Home oxygen  . Palpitations   . Coronary atherosclerosis of native coronary artery     Nonobstructive at catheterization 2008  . GERD (gastroesophageal reflux disease)   . Irritable bowel syndrome   . Chronic anemia   . Hypothyroidism   . Hyperlipidemia   . Chronic edema     . Pneumonia   . Diabetes mellitus   . Hypertension     Past Surgical History  Procedure Date  . Cholecystectomy     Family History  Problem Relation Age of Onset  . Hypertension      Social History Kara [name] reports that she has quit smoking. Her smoking use included Cigarettes. She has never used smokeless tobacco. Kara [name] reports that she does not drink alcohol.  Review of Systems As outlined above. Reports stable appetite. Has chronic shortness of breath and uses oxygen continuously. No recent cough or hemoptysis. Occasional atypical chest pains. Otherwise negative.  Physical Examination Filed Vitals:   04/13/11 1423  BP: 82/49  Pulse: 62  Resp: 18  Overweight, chronically ill appearing woman wearing oxygen in no acute distress. HEENT: Conjunctiva and lids normal, oropharynx with moist mucosa. Neck: Supple, no elevated JVP or carotid bruits. Lungs: Diminished breath sounds throughout, no wheezing. Cardiac: Distant regular heart sounds, no S3 gallop or pericardial rub. Abdomen: Protuberant, soft, nontender, bowel sounds present. Extremities: Exhibit chronic appearing 3+ edema below the knees bilaterally, pitting, diffuse erythema in both legs. Looks like early eschar formation around the left knee. Distal pulses are diminished. No obvious drainage. Skin: Otherwise warm and dry. Neuropsychiatric: Alert and oriented x3, seems to have difficulty with short-term memory. Moves all extremities.   ECG Normal sinus rhythm at 63 beats per minute with diffuse nonspecific ST-T changes.  Studies Echocardiogram 03/23/2011: - Left ventricle: The cavity size was at the upper limits of normal.     There was mild  concentric hypertrophy. Systolic function was low     normal. The estimated ejection fraction was in the range of 50% to     55%. Wall motion was normal; there were no regional wall motion     abnormalities.   - Aortic valve: Calcified annulus. Mild regurgitation.   -  Mitral valve: Calcified annulus.   - Left atrium: The atrium was mildly dilated.   - Right atrium: The atrium was mildly dilated.   - Atrial septum: No defect or patent foramen ovale was identified.   Problem List and Plan

## 2011-04-13 NOTE — Assessment & Plan Note (Signed)
Long-standing, oxygen dependent.

## 2011-04-13 NOTE — Assessment & Plan Note (Signed)
Significant degree of edema noted, symmetrical overall. Does not appear to be related to systolic dysfunction based on her most recent echocardiogram. Possibility of at least a component of cor pulmonale to be considered in light of her chronic lung disease. Also would be suspicious about associated lymphedema. Deep venous thrombosis was excluded by recent Dopplers. Her blood pressure is on the low side with increasing doses of diuretic, and concern is that she may be becoming intravascularly depleted. I discussed the situation with Dr. Renard Matter and would recommend inpatient evaluation with wound care consultation, careful intravenous diuresis, treatment with antibiotics if appropriate, and potentially compression dressings. Would also assess TSH and protein status, urinalysis. CT imaging of the soft tissues of the lower extremities would also be a consideration.

## 2011-04-13 NOTE — H&P (Signed)
NAMESHANELE, NISSAN              ACCOUNT NO.:  [id number]  MEDICAL RECORD NO.:  [id number]  LOCATION:  A334                          FACILITY:  [facility]  PHYSICIAN:  Deloris Mittag G. Renard Matter, MD   DATE OF BIRTH:  1946-01-22  DATE OF ADMISSION:  04/13/2011 DATE OF DISCHARGE:  LH                             HISTORY & PHYSICAL   This 65 year old white female, had a longstanding history of edema in both lower extremities along with possible cellulitis.  She has been treated with diuretics antibiotics over a period of time and still continues to complain of these problems.  She was seen in consultation by Cardiology, Dr. Diona Browner and is a former patient of Dr. Daleen Squibb and Dr. Eden Emms.  She gives a history of several months of progressive lower extremity edema.  This has gradually worsened.  She has a tight feeling in her legs and this has had some weeping and erythema.  She did have Dopplers done of extremities which did not demonstrate DVT.  She continues to worsen.  Her creatinine has increased slightly.  A previous cardiac catheterization demonstrated mildly increased pulmonary pressures, possible component of cor pulmonale.  Does have an abrasion of the lower leg as well from recent fall.  She has had intermittent episodes of chest pain for the past several months.  It was felt patient should be admitted for more intense care.  SOCIAL HISTORY:  The patient was a former cigarette smoker, does not smoke now nor does she use alcohol.  FAMILY HISTORY:  Positive for hypertension.  PAST MEDICAL HISTORY:  Type 2 diabetes, arthritis, COPD, palpitations, coronary artery disease, nonobstructive catheterization in 2008, GERD, irritable bowel syndrome, chronic anemia, hypothyroidism, hyperlipidemia, chronic edema, previous pneumonia, hypertension.  PAST SURGICAL HISTORY:  History of cholecystectomy.  ALLERGIES:  To CODEINE.  REVIEW OF SYSTEMS:  HEENT:  Negative.  CARDIOPULMONARY:  The patient has had  shortness of breath, but has not been coughing.  She has had intermittent chest pain.  GI:  No nausea, vomiting, or diarrhea.  GU: No dysuria or hematuria.  MEDICATION LIST: 1. Alprazolam 1 mg t.i.d. 2. Buspirone hydrochloride 10 mg daily. 3. Choline fenofibrate 135 mg daily. 4. Citalopram hydrobromide 20 mg b.i.d. 5. Dexlansoprazole 60 mg daily. 6. Diltiazem hydrochloride beads p.o. CP24 daily. 7. Furosemide 20 mg 2 tablets daily. 8. Furosemide 40 mg t.i.d. 9. Gabapentin 300 mg b.i.d. 10.Guaifenesin 200 mg, 400 mg 2 times daily. 11.Levothyroxine 100 mcg daily. 12.Lubiprostone 24 mcg two times daily. 13.Metformin 1000 mg b.i.d. 14.Metolazone 10 mg daily. 15.Metoprolol 25 mg b.i.d. 16.Morphine sulfate 30 mg t.i.d.  PHYSICAL EXAMINATION:  VITAL SIGNS:  She is an alert female with blood pressure 92/46, pulse 61, temperature 98. HEENT:  Eyes PERRLA.  TM negative.  Oropharynx benign. NECK:  Supple.  No JVD or thyroid abnormalities. HEART:  Regular rhythm.  No murmurs. LUNGS:  No rhonchi or wheezes noted. ABDOMEN:  No palpable organs or masses.  No organomegaly. NEUROLOGICAL:  The patient is alert.  No motor or sensory dysfunction or cranial nerve abnormality. EXTREMITIES:  Bilateral 2 to 3+ edema and inflammation of the feet and lower legs bilaterally.  PERTINENT LABORATORY STUDIES:  Hemoglobin 8.8, hematocrit  29. Chemistries normal with exception of potassium 3.4, BUN 29, creatinine 2.13, her BNP 2182.  The patient is admitted with following problems: 1. Bilateral edema and cellulitis of both lower extremities, possible     lymphedema, considered diastolic dysfunction. 2. Diabetes mellitus type 2. 3. Chronic obstructive pulmonary disease. 4. Gastroesophageal reflux disease. 5. Irritable bowel syndrome. 6. Hypothyroidism. 7. Hyperlipidemia. 8. Hypertension.  PLAN:  To start the patient on IV vancomycin.  We will diurese with Lasix.  Continue to monitor serum potassium and  creatinine.     Dezi Schaner G. Renard Matter, MD     AGM/MEDQ  D:  04/13/2011  T:  04/13/2011  Job:  [id number]

## 2011-04-13 NOTE — Assessment & Plan Note (Signed)
Relatively low blood pressure in setting of increasing diuresis over time.

## 2011-04-13 NOTE — ED Notes (Signed)
Pt c/o cp and sob that has lasted for a few weeks. Pt describes pt under left breast as throbbing pain. Pt also c/o swelling and redness to both legs. Pt was sent here today by Dr. Renard Matter.[name]

## 2011-04-13 NOTE — ED Notes (Signed)
CRITICAL VALUE ALERT  Critical value received:  C02-40  Date of notification:  04/13/11  Time of notification:  1702  Critical value read back:yes  Nurse who received alert:  t abbott rn  MD notified (1st page): [email or web address]  Time of first page:  1702  MD notified (2nd page):  Time of second page:  Responding MD: [email or web address]  Time MD responded: [phone]

## 2011-04-13 NOTE — Assessment & Plan Note (Signed)
Questionable history of atrial fibrillation or perhaps atrial tachycardia based on old records. She is on high-dose Cardizem as well as lower dose metoprolol. Would suggest decreasing Cardizem dosing to see if this might also help with her lower extremity edema. She can be followed on telemetry.

## 2011-04-14 DIAGNOSIS — I2510 Atherosclerotic heart disease of native coronary artery without angina pectoris: Secondary | ICD-10-CM

## 2011-04-14 LAB — BASIC METABOLIC PANEL
BUN: 28 mg/dL — ABNORMAL HIGH (ref 6–23)
CO2: 44 mEq/L (ref 19–32)
Calcium: 8.7 mg/dL (ref 8.4–10.5)
Glucose, Bld: 81 mg/dL (ref 70–99)
Sodium: 138 mEq/L (ref 135–145)

## 2011-04-14 LAB — GLUCOSE, CAPILLARY
Glucose-Capillary: 132 mg/dL — ABNORMAL HIGH (ref 70–99)
Glucose-Capillary: 149 mg/dL — ABNORMAL HIGH (ref 70–99)

## 2011-04-14 LAB — RETICULOCYTES
RBC.: 2.52 MIL/uL — ABNORMAL LOW (ref 3.87–5.11)
Retic Count, Absolute: 30.2 10*3/uL (ref 19.0–186.0)

## 2011-04-14 LAB — IRON AND TIBC
Iron: 49 ug/dL (ref 42–135)
TIBC: 370 ug/dL (ref 250–470)

## 2011-04-14 LAB — TSH: TSH: 3.898 u[IU]/mL (ref 0.350–4.500)

## 2011-04-14 LAB — FERRITIN: Ferritin: 145 ng/mL (ref 10–291)

## 2011-04-14 MED ORDER — POTASSIUM CHLORIDE CRYS ER 20 MEQ PO TBCR
20.0000 meq | EXTENDED_RELEASE_TABLET | Freq: Once | ORAL | Status: AC
  Administered 2011-04-14: 20 meq via ORAL
  Filled 2011-04-14: qty 1

## 2011-04-14 MED ORDER — ALPRAZOLAM 1 MG PO TABS
1.0000 mg | ORAL_TABLET | Freq: Three times a day (TID) | ORAL | Status: DC | PRN
  Administered 2011-04-14 – 2011-04-20 (×12): 1 mg via ORAL
  Filled 2011-04-14 (×12): qty 1

## 2011-04-14 MED ORDER — DILTIAZEM HCL ER COATED BEADS 120 MG PO CP24
120.0000 mg | ORAL_CAPSULE | Freq: Every day | ORAL | Status: DC
  Administered 2011-04-14 – 2011-04-20 (×6): 120 mg via ORAL
  Filled 2011-04-14 (×6): qty 1

## 2011-04-14 MED ORDER — GUAIFENESIN 100 MG/5ML PO SOLN
20.0000 mL | Freq: Two times a day (BID) | ORAL | Status: DC
  Administered 2011-04-14 – 2011-04-18 (×7): 400 mg via ORAL
  Filled 2011-04-14 (×15): qty 20

## 2011-04-14 MED ORDER — ZOLPIDEM TARTRATE 5 MG PO TABS
10.0000 mg | ORAL_TABLET | Freq: Every evening | ORAL | Status: DC | PRN
  Administered 2011-04-14 – 2011-04-19 (×2): 10 mg via ORAL
  Filled 2011-04-14 (×2): qty 2

## 2011-04-14 MED ORDER — ACETAMINOPHEN 500 MG PO TABS
500.0000 mg | ORAL_TABLET | ORAL | Status: DC | PRN
  Administered 2011-04-14 – 2011-04-19 (×4): 500 mg via ORAL
  Filled 2011-04-14 (×4): qty 1

## 2011-04-14 MED ORDER — SODIUM CHLORIDE 0.9 % IJ SOLN
25.0000 mg | Freq: Once | INTRAVENOUS | Status: DC
  Filled 2011-04-14: qty 2

## 2011-04-14 MED ORDER — SODIUM CHLORIDE 0.9 % IV SOLN
Freq: Once | INTRAVENOUS | Status: AC
  Administered 2011-04-14: 15:00:00 via INTRAVENOUS

## 2011-04-14 MED ORDER — SODIUM CHLORIDE 0.9 % IJ SOLN
250.0000 mg | Freq: Every day | INTRAVENOUS | Status: AC
  Administered 2011-04-15 – 2011-04-18 (×4): 250 mg via INTRAVENOUS
  Filled 2011-04-14 (×4): qty 20

## 2011-04-14 MED ORDER — VANCOMYCIN HCL 1000 MG IV SOLR
1250.0000 mg | INTRAVENOUS | Status: DC
  Administered 2011-04-14 – 2011-04-15 (×2): 1250 mg via INTRAVENOUS
  Filled 2011-04-14 (×4): qty 1250

## 2011-04-14 NOTE — Progress Notes (Signed)
ANTIBIOTIC CONSULT NOTE - INITIAL  Pharmacy Consult for Vancomycin Indication: Cellulitis  Allergies  Allergen Reactions  . Codeine Nausea And Vomiting and Rash    Patient Measurements: Height: 5\' 6"  (167.6 cm) Weight: 179 lb 7.3 oz (81.4 kg) IBW/kg (Calculated) : 59.3  Adjusted Body Weight:70  Vital Signs: Temp: 100.7 F (38.2 C) (09/21 0612) Temp src: Oral (09/21 0612) BP: 90/53 mmHg (09/21 0612) Pulse Rate: 61  (09/21 0612) Intake/Output from previous day: 09/20 0701 - 09/21 0700 In: 240 [P.O.:240] Out: 3100 [Urine:3100] Intake/Output from this shift:    Labs:  Basename 04/14/11 0539 04/13/11 1558  WBC -- 7.5  HGB -- 8.8*  PLT -- 345  LABCREA -- --  CREATININE 1.77* 2.13*  CRCLEARANCE -- --      Microbiology: None  Medical History: Past Medical History  Diagnosis Date  . Type 2 diabetes mellitus   . Arthritis   . COPD (chronic obstructive pulmonary disease)     Home oxygen  . Palpitations   . Coronary atherosclerosis of native coronary artery     Nonobstructive at catheterization 2008  . GERD (gastroesophageal reflux disease)   . Irritable bowel syndrome   . Chronic anemia   . Hypothyroidism   . Hyperlipidemia   . Chronic edema   . Pneumonia   . Diabetes mellitus   . Hypertension     Medications:   Reviewed Assessment:  Empiric therapy for cellulitis. Renal function parameters improving. CrCl around 35 mls per minute. Received 1 gram of Vancomycin on 04/13/11  Goal of Therapy:  Vancomycin trough level 10-15 mcg/ml  Plan:  Measure antibiotic drug levels at steady state Follow up culture results Vancomycin 1250mg  IV q24hrs  Issabella Rix J 04/14/2011,9:50 AM

## 2011-04-14 NOTE — Progress Notes (Signed)
cCRITICAL VALUE ALERT  Critical value received:  CO2 of 44  Date of notification:  04/14/2011  Time of notification:  06:30   Critical value read back:yes  Nurse who received alert:  Rhae Hammock, RN  MD notified (1st page):  Spoke to Dr. Renard Matter in person  Time of first page:  06:45   MD notified (2nd page):  Time of second page:  Responding MD:  Dr. Renard Matter in person  Time MD responded:  06:45  He will handle this and is aware.

## 2011-04-14 NOTE — Progress Notes (Signed)
Kara [name], [name]              ACCOUNT NO.:  [id number]  MEDICAL RECORD NO.:  [id number]  LOCATION:  A334                          FACILITY:  [facility]  PHYSICIAN:  Ranell Skibinski G. Renard Matter, MD   DATE OF BIRTH:  April 30, 1946  DATE OF PROCEDURE: DATE OF DISCHARGE:                                PROGRESS NOTE   This patient was admitted with longstanding history of edema in lower extremities along with cellulitis.  She was admitted for purpose of more aggressive treatment of her cellulitis and edema in her legs.  She is on IV vancomycin now and is diuresing with Lasix.  OBJECTIVE:  VITAL SIGNS:  Blood pressure 90/53, respiration 20, pulse 61, temperature 100.7. LUNGS:  Clear to P and A. HEART:  Regular rhythm. ABDOMEN:  No palpable organs or masses. EXTREMITIES:  Bilateral 2-3+ edema and inflammation of skin from knees down on both legs.  ASSESSMENT:  The patient does have the following problems: 1. Bilateral edema and cellulitis of both lower extremities. 2. Possible lymphedema, consider diastolic dysfunction in view     elevated BNP. 3. Diabetes mellitus type 2. 4. Chronic obstructive pulmonary disease. 5. Gastroesophageal reflux disease. 6. Irritable bowel syndrome. 7. Hypothyroidism. 8. Hyperlipidemia. 9. Hypertension.  PLAN:  To continue IV vancomycin.  Continue aggressive diuresis.  We will obtain Cardiology consult.  Continue IV vancomycin with pharmaceutical protocol.     Gagan Dillion G. Renard Matter, MD     AGM/MEDQ  D:  04/14/2011  T:  04/14/2011  Job:  [id number]

## 2011-04-14 NOTE — Progress Notes (Signed)
Telephoned MD r/t bp and heart rate outside parameters, new orders written. Will continue to monitor.

## 2011-04-14 NOTE — Consult Note (Signed)
SUBJECTIVE: Multiple somatic complaints. Does not feel well at all, but she is not experiencing dyspnea, chest discomfort, orthopnea or PND.   Filed Vitals:   04/13/11 1939 04/13/11 2039 04/13/11 2059 04/14/11 0612  BP: 102/59  104/61 90/53  Pulse: 62  63 61  Temp: 98.4 F (36.9 C)  98.9 F (37.2 C) 100.7 F (38.2 C)  TempSrc: Oral  Oral Oral  Resp: 20  20 20   Height: 5\' 6"  (1.676 m)     Weight: 179 lb 7.3 oz (81.4 kg)     SpO2: 93% 95% 93%     Intake/Output Summary (Last 24 hours) at 04/14/11 1117 Last data filed at 04/14/11 0700  Gross per 24 hour  Intake    240 ml  Output   3100 ml  Net  -2860 ml    LABS: Basic Metabolic Panel:  Basename 04/14/11 0539 04/13/11 1558  NA 138 135  K 3.4* 3.4*  CL 90* 86*  CO2 44* 40*  GLUCOSE 81 156*  BUN 28* 29*  CREATININE 1.77* 2.13*  CALCIUM 8.7 9.6  MG -- --  PHOS -- --   CBC:  Basename 04/13/11 1558  WBC 7.5  NEUTROABS 5.1  HGB 8.8*  HCT 29.0*  MCV 92.9  PLT 345    Basename 04/13/11 1558  POCBNP 2182.0*   Anemia Panel:  ECHO: (03/23/2011)  - Left ventricle: The cavity size was at the upper limits of normal. There was mild concentric hypertrophy. Systolic function was low normal. The estimated ejection fraction was in the range of 50% to 55%. Wall motion was normal; there were no regional wall motion abnormalities. - Aortic valve: Calcified annulus. Mild regurgitation. - Mitral valve: Calcified annulus. - Left atrium: The atrium was mildly dilated. - Right atrium: The atrium was mildly dilated. - Atrial septum: No defect or patent foramen ovale was identified.   RADIOLOGY: Dg Chest Portable 1 View  04/13/2011  *RADIOLOGY REPORT*  Clinical Data: Shortness of breath, bilateral lower extremity edema, redness, warmth  PORTABLE CHEST - 1 VIEW  Comparison: Portable exam 1616 hours compared to 04/03/2011  Findings: Enlargement of cardiac silhouette. Tortuous aorta. Pulmonary vascularity normal. Lungs clear. No  pleural effusion or pneumothorax. No acute osseous findings.  IMPRESSION: Enlargement of cardiac silhouette. No acute abnormalities.  Original Report Authenticated By: Lollie Marrow, M.D.   Dg Knee Complete 4 Views Left  04/03/2011  *RADIOLOGY REPORT*  Clinical Data: Status post multiple falls; swelling and erythema at both lower legs, and left hip pain.  LEFT KNEE -   IMPRESSION:  1.  No evidence of fracture or dislocation. 2.  Question of a small loose body projecting at the medial compartment. 3.  Mild scattered vascular calcifications seen. 4.  Soft tissue swelling noted anterior to the patellar tendon.  Original Report Authenticated By: Tonia Ghent, M.D.    PHYSICAL EXAM General: Well developed, well nourished, in no acute distress Head: Eyes PERRLA, No xanthomas.   Normal cephalic and atramatic  Lungs: Diminished breath sounds with poor respiratory effort.  Early inspiratory bibasilar rales. Heart: HRRR S1 S2,.  Pulses are 2+ & equal.            No carotid bruit. No JVD.  No abdominal bruits. No femoral bruits. Abdomen: Bowel sounds are positive, abdomen soft and non-tender without masses or  Hernia's noted. Mild distention. Msk: Overall diminished strength and weakness, mild kyphosis, Extremities:  3+ bilateral LEE, some erythema noted. Left knee-subcutaneous blood, abrasions with crusting. Neuro: Alert  and oriented X 3. Psych:  Flat affect, responds appropriately; difficulty finding words  TELEMETRY: Reviewed telemetry pt in NSR with PAC's.  Some appearance of flutter waves, but may be artifact.  ASSESSMENT AND PLAN: 1. Lower Extremity Edema: This has improved somewhat per patient subjective assessment but continued painful.  Dr. Ival Bible note states that DVT has been r/o per recent doppler study. BNP was elevated on admission at 2182.0 which is elevated from 1335 on 04/02/2011.  Wt 177-177 on admission. No wts this am.   2. Diastolic Heart Failure:  Continues diureses with Lasix 40  mg IV BID (1950 cc) also receiving NS at 100cc/hr.  Creatinine improved from 2.13 to 1.77 from admission. K+ 3.4.  Will give replacement. Consider stopping IV fluids now.  3. Cellulitis of LE: Being followed by Dr. Renard Matter and is now on Vancomycin.   Bettey Mare. Lyman Bishop NP [facility] ___________________________  Cardiology Attending  Patient interviewed and examined. Discussed with Joni Reining, NP.  Above note annotated and modified based upon my findings.  Patient is improved after initial diuresis, which is difficult to estimate due to the absence of serial weights. On admission, weight was 178, and not markedly different from a weight of 174 measured in the office approximately 4 months ago. Nonetheless, she clearly has substantial lower extremity edema and requires additional diuresis. Renal function has improved with initial fluid removal, but this may not continue to be the case. She appears to need at least another 10 pounds of net negative fluid balance.   Intravenous normal saline will be discontinued. Serial metabolic profiles have already been ordered.  Hypoproteinemia may be contributing to her edema-a complete metabolic profile will be assessed.  Fina has a moderate anemia with iron studies consistent with iron deficiency. A course of intravenous replacement will be given.   She has had CO2 retention in the past and may have a respiratory acidosis accounting for her increase in bicarbonate.   [place] Bing, MD

## 2011-04-14 NOTE — Consult Note (Signed)
CARDIOLOGY CONSULTATION NOTE:Dr. Diona Browner Evaluation in Office prior to admission. Kara [name]   04/13/2011 1:40 PM Office Visit  MRN: [id number]   Description: 65 year old female  Provider: Nona Dell, MD  Department: [facility]        Referring Provider     Kara [name]      Diagnoses     Lower extremity edema   - Primary    782.3    COPD (chronic obstructive pulmonary disease)     496    Essential hypertension, benign     401.1    Coronary atherosclerosis of native coronary artery     414.01    Hypothyroidism     244.9    Palpitations     785.1      Reason for Visit     Leg Swelling       Reason For Visit History Recorded         Progress Notes     Nona Dell, MD  04/13/2011  4:51 PM  Signed Clinical Summary Kara [name] is a 65 y.o.female referred for cardiology consultation by Dr. Renard [name]. She is a former patient of Dr. Daleen [name] and Dr. Eden [name], last seen in 2010. Her cardiac history is noted below.   She is here with her sister. She reports a several month history of progressive lower extremity edema. She states that this edema has gradually worsened, is painful with tight feeling in her legs below the knees, at times has had weeping, and is also associated with erythema. She reports undergoing extensive evaluation with Dr. Renard [name], has been treated with antibiotics, had lower extremity Dopplers that did not demonstrate DVT, and also has been on various combinations of diuretics, with no significant improvement as yet.   Recent lab work from September 7 showed sodium 140, potassium 3.1, BUN 14, creatinine 1.1. Compared to labs from August, potassium has decreased and creatinine has increased.   In review of her cardiac testing, she has had normal to low-normal LVEF, description of normal RV function, and her cardiac catheterization demonstrated mildly increased pulmonary pressures. Possibility of a component of cor pulmonale with her  significant lung disease is to be considered. Not certain about her protein status. She has no known history of thromboembolic disease.   She states that she hit her left leg/knee recently and has an abrasion there, looks like early eschar formation.      Allergies   Allergen  Reactions   .  Codeine  Nausea And Vomiting and Rash      Medication list reviewed.    Past Medical History   Diagnosis  Date   .  Type 2 diabetes mellitus     .  Arthritis     .  COPD (chronic obstructive pulmonary disease)         Home oxygen   .  Palpitations     .  Coronary atherosclerosis of native coronary artery         Nonobstructive at catheterization 2008   .  GERD (gastroesophageal reflux disease)     .  Irritable bowel syndrome     .  Chronic anemia     .  Hypothyroidism     .  Hyperlipidemia     .  Chronic edema     .  Pneumonia     .  Diabetes mellitus     .  Hypertension         Past Surgical History  Procedure  Date   .  Cholecystectomy         Family History   Problem  Relation  Age of Onset   .  Hypertension          Social History Kara [name] reports that she has quit smoking. Her smoking use included Cigarettes. She has never used smokeless tobacco. Kara [name] reports that she does not drink alcohol.   Review of Systems As outlined above. Reports stable appetite. Has chronic shortness of breath and uses oxygen continuously. No recent cough or hemoptysis. Occasional atypical chest pains. Otherwise negative.   Physical Examination Filed Vitals:     04/13/11 1423   BP:  82/49   Pulse:  62   Resp:  18    Overweight, chronically ill appearing woman wearing oxygen in no acute distress. HEENT: Conjunctiva and lids normal, oropharynx with moist mucosa. Neck: Supple, no elevated JVP or carotid bruits. Lungs: Diminished breath sounds throughout, no wheezing. Cardiac: Distant regular heart sounds, no S3 gallop or pericardial rub. Abdomen: Protuberant, soft, nontender,  bowel sounds present. Extremities: Exhibit chronic appearing 3+ edema below the knees bilaterally, pitting, diffuse erythema in both legs. Looks like early eschar formation around the left knee. Distal pulses are diminished. No obvious drainage. Skin: Otherwise warm and dry. Neuropsychiatric: Alert and oriented x3, seems to have difficulty with short-term memory. Moves all extremities.     ECG Normal sinus rhythm at 63 beats per minute with diffuse nonspecific ST-T changes.   Studies Echocardiogram 03/23/2011: - Left ventricle: The cavity size was at the upper limits of normal.     There was mild concentric hypertrophy. Systolic function was low     normal. The estimated ejection fraction was in the range of 50% to     55%. Wall motion was normal; there were no regional wall motion     abnormalities.   - Aortic valve: Calcified annulus. Mild regurgitation.   - Mitral valve: Calcified annulus.   - Left atrium: The atrium was mildly dilated.   - Right atrium: The atrium was mildly dilated.   - Atrial septum: No defect or patent foramen ovale was identified.     Problem List and Plan            Lower extremity edema - Nona Dell, MD  04/13/2011  4:14 PM  Signed Significant degree of edema noted, symmetrical overall. Does not appear to be related to systolic dysfunction based on her most recent echocardiogram. Possibility of at least a component of cor pulmonale to be considered in light of her chronic lung disease. Also would be suspicious about associated lymphedema. Deep venous thrombosis was excluded by recent Dopplers. Her blood pressure is on the low side with increasing doses of diuretic, and concern is that she may be becoming intravascularly depleted. I discussed the situation with Dr. Renard [name] and would recommend inpatient evaluation with wound care consultation, careful intravenous diuresis, treatment with antibiotics if appropriate, and potentially compression dressings.  Would also assess TSH and protein status, urinalysis. CT imaging of the soft tissues of the lower extremities would also be a consideration.  COPD (chronic obstructive pulmonary disease) - Nona Dell, MD  04/13/2011  4:14 PM  Signed Long-standing, oxygen dependent.  Coronary atherosclerosis of native coronary artery - Nona Dell, MD  04/13/2011  4:15 PM  Signed Nonobstructive disease documented at cardiac catheterization in 2008.  Hypothyroidism - Nona Dell, MD  04/13/2011  4:15 PM  Signed Need to followup TSH.  Essential hypertension, benign - Nona Dell, MD  04/13/2011  4:15 PM  Signed Relatively low blood pressure in setting of increasing diuresis over time.  Palpitations - Nona Dell, MD  04/13/2011  4:16 PM  Signed Questionable history of atrial fibrillation or perhaps atrial tachycardia based on old records. She is on high-dose Cardizem as well as lower dose metoprolol. Would suggest decreasing Cardizem dosing to see if this might also help with her lower extremity edema. She can be followed on telemetry.   Cardiology Attending  Patient interviewed and examined. Discussed with Joni Reining, NP.    Amity Bing, MD

## 2011-04-15 LAB — COMPREHENSIVE METABOLIC PANEL
Albumin: 3 g/dL — ABNORMAL LOW (ref 3.5–5.2)
Alkaline Phosphatase: 42 U/L (ref 39–117)
BUN: 27 mg/dL — ABNORMAL HIGH (ref 6–23)
Creatinine, Ser: 1.75 mg/dL — ABNORMAL HIGH (ref 0.50–1.10)
Potassium: 3.4 mEq/L — ABNORMAL LOW (ref 3.5–5.1)
Total Protein: 5.9 g/dL — ABNORMAL LOW (ref 6.0–8.3)

## 2011-04-15 LAB — GLUCOSE, CAPILLARY
Glucose-Capillary: 111 mg/dL — ABNORMAL HIGH (ref 70–99)
Glucose-Capillary: 131 mg/dL — ABNORMAL HIGH (ref 70–99)
Glucose-Capillary: 185 mg/dL — ABNORMAL HIGH (ref 70–99)

## 2011-04-15 LAB — CBC
Platelets: 291 10*3/uL (ref 150–400)
RBC: 2.65 MIL/uL — ABNORMAL LOW (ref 3.87–5.11)
WBC: 4.8 10*3/uL (ref 4.0–10.5)

## 2011-04-15 MED ORDER — SODIUM CHLORIDE 0.9 % IJ SOLN
25.0000 mg | Freq: Once | INTRAVENOUS | Status: AC
  Administered 2011-04-15: 25 mg via INTRAVENOUS
  Filled 2011-04-15: qty 2

## 2011-04-15 NOTE — Progress Notes (Signed)
[name], [name]              ACCOUNT NO.:  [id number]  MEDICAL RECORD NO.:  [id number]  LOCATION:  A334                          FACILITY:  [facility]  PHYSICIAN:  Vannesa Abair G. Renard Matter, MD   DATE OF BIRTH:  05/13/1946  DATE OF PROCEDURE: DATE OF DISCHARGE:                                PROGRESS NOTE   This patient has longstanding history of edema in lower extremities along with cellulitis.  She was admitted with problems for more aggressive treatment cellulitis and edema in her legs.  She is on IV vancomycin now and is diuresing.  She does have Kara negative fluid balance of 6155 and is feeling some better.  Her most recent lab studies were pertinent.  Sodium 140, potassium 3.4, chloride 93, CO2 45, BUN 27, creatinine 1.75.  She did have Kara drop in blood pressure yesterday and her Cardizem was held.  PHYSICAL EXAMINATION:  LUNGS:  Clear to P and Kara. HEART:  Regular rhythm. ABDOMEN:  No palpable organs or masses. EXTREMITIES:  The patient has bilateral edema, inflammation of skin from knees down both legs.  ASSESSMENT:  The patient does have the following problems. 1. Bilateral edema and cellulitis in both lower extremities, diabetes     mellitus type 2, chronic obstructive pulmonary disease,     gastroesophageal reflux disease, irritable bowel syndrome,     hypothyroidism, hyperlipidemia, and hypertension.  PLAN:  To continue her diuresis.  Continue IV vancomycin with pharmaceutical protocol.  Continue to monitor chemistries, BMET, and anemia panel.     Buel Molder G. Renard Matter, MD     AGM/MEDQ  D:  04/15/2011  T:  04/15/2011  Job:  [id number]

## 2011-04-16 ENCOUNTER — Inpatient Hospital Stay (HOSPITAL_COMMUNITY): Payer: Medicaid Other

## 2011-04-16 LAB — BASIC METABOLIC PANEL
GFR calc Af Amer: 38 mL/min — ABNORMAL LOW (ref >60)
GFR calc non Af Amer: 32 mL/min — ABNORMAL LOW (ref >60)
Potassium: 3.3 mEq/L — ABNORMAL LOW (ref 3.5–5.1)
Sodium: 139 mEq/L (ref 135–145)

## 2011-04-16 LAB — ABO/RH: ABO/RH(D): O NEG

## 2011-04-16 LAB — VANCOMYCIN, TROUGH: Vancomycin Tr: 25 ug/mL — ABNORMAL HIGH (ref 10.0–20.0)

## 2011-04-16 LAB — GLUCOSE, CAPILLARY: Glucose-Capillary: 152 mg/dL — ABNORMAL HIGH (ref 70–99)

## 2011-04-16 MED ORDER — VANCOMYCIN HCL IN DEXTROSE 1-5 GM/200ML-% IV SOLN
1000.0000 mg | INTRAVENOUS | Status: DC
  Administered 2011-04-16 – 2011-04-19 (×4): 1000 mg via INTRAVENOUS
  Filled 2011-04-16 (×5): qty 200

## 2011-04-16 MED ORDER — VANCOMYCIN HCL IN DEXTROSE 1-5 GM/200ML-% IV SOLN
INTRAVENOUS | Status: AC
  Filled 2011-04-16: qty 200

## 2011-04-16 MED ORDER — INSULIN ASPART 100 UNIT/ML ~~LOC~~ SOLN
0.0000 [IU] | Freq: Three times a day (TID) | SUBCUTANEOUS | Status: DC
  Administered 2011-04-16 – 2011-04-17 (×3): 3 [IU] via SUBCUTANEOUS
  Administered 2011-04-18: 5 [IU] via SUBCUTANEOUS
  Administered 2011-04-18: 3 [IU] via SUBCUTANEOUS
  Administered 2011-04-19: 2 [IU] via SUBCUTANEOUS
  Administered 2011-04-19: 3 [IU] via SUBCUTANEOUS
  Filled 2011-04-16: qty 3

## 2011-04-16 MED ORDER — SODIUM CHLORIDE 0.9 % IJ SOLN
INTRAMUSCULAR | Status: AC
  Administered 2011-04-16: 10 mL
  Filled 2011-04-16: qty 10

## 2011-04-16 NOTE — Progress Notes (Signed)
Kara [name], Kara [name]              ACCOUNT NO.:  [id number]  MEDICAL RECORD NO.:  [id number]  LOCATION:  A334                          FACILITY:  [facility]  PHYSICIAN:  Tacuma Graffam G. Renard Matter, MD   DATE OF BIRTH:  1946/05/13  DATE OF PROCEDURE: DATE OF DISCHARGE:                                PROGRESS NOTE   This patient continues to have problems with edema and cellulitis in lower extremities.  She was admitted with these issues for more aggressive treatment of the cellulitis and edema in her legs.  She is on IV vancomycin and is diuresing, does have negative fluid balance and is feeling some better.  Her most recent labs chemistries normal with exception of slight elevation of creatinine 1.75.  Her current hemoglobin is low at 7.5 with hematocrit 24.5.  Her anemia panel is relatively normal.  PHYSICAL EXAMINATION:  VITAL SIGNS:  Blood pressure 119/63, respirations 19, pulse 75 and temp of 100.1. LUNGS:  Diminished breath sounds. HEART:  Regular rhythm. ABDOMEN:  No palpable organs or masses. EXTREMITIES:  The patient has bilateral edema in both lower extremities and inflammation of the skin of the distal extremities.  ASSESSMENT:  The patient has warm bilateral edema and cellulitis of both lower extremities.  PLAN:  To continue IV vancomycin, diabetes mellitus type 2 fairly well- controlled, chronic obstructive pulmonary disease, gastroesophageal reflux disease, irritable bowel syndrome, hypothyroidism, hyperlipidemia and hypertension.  The patient does have a marked anemia.  We will collect stools for occult blood type and cross for 2 units of packed RBCs, anemia panel and chest x-ray.     Rosemarie Galvis G. Renard Matter, MD     AGM/MEDQ  D:  04/16/2011  T:  04/16/2011  Job:  [id number]

## 2011-04-17 LAB — BASIC METABOLIC PANEL
BUN: 33 mg/dL — ABNORMAL HIGH (ref 6–23)
CO2: 42 mEq/L (ref 19–32)
Chloride: 85 mEq/L — ABNORMAL LOW (ref 96–112)
Creatinine, Ser: 1.63 mg/dL — ABNORMAL HIGH (ref 0.50–1.10)
GFR calc Af Amer: 39 mL/min — ABNORMAL LOW (ref >60)
GFR calc non Af Amer: 32 mL/min — ABNORMAL LOW (ref >60)
Glucose, Bld: 155 mg/dL — ABNORMAL HIGH (ref 70–99)
Potassium: 3.2 mEq/L — ABNORMAL LOW (ref 3.5–5.1)
Potassium: 3.4 mEq/L — ABNORMAL LOW (ref 3.5–5.1)
Sodium: 139 mEq/L (ref 135–145)

## 2011-04-17 LAB — GLUCOSE, CAPILLARY: Glucose-Capillary: 103 mg/dL — ABNORMAL HIGH (ref 70–99)

## 2011-04-17 LAB — OCCULT BLOOD X 1 CARD TO LAB, STOOL: Fecal Occult Bld: NEGATIVE

## 2011-04-17 MED ORDER — SODIUM CHLORIDE 0.9 % IJ SOLN
INTRAMUSCULAR | Status: AC
  Filled 2011-04-17: qty 3

## 2011-04-17 MED ORDER — POTASSIUM CHLORIDE 10 MEQ/100ML IV SOLN
10.0000 meq | INTRAVENOUS | Status: AC
  Administered 2011-04-17 (×2): 10 meq via INTRAVENOUS
  Filled 2011-04-17 (×2): qty 100

## 2011-04-17 MED ORDER — POTASSIUM CHLORIDE 10 MEQ/100ML IV SOLN
10.0000 meq | Freq: Once | INTRAVENOUS | Status: DC
  Administered 2011-04-17: 10 meq via INTRAVENOUS

## 2011-04-17 MED ORDER — POTASSIUM CHLORIDE CRYS ER 20 MEQ PO TBCR: 20.0000 meq | EXTENDED_RELEASE_TABLET | Freq: Every day | ORAL | Status: DC

## 2011-04-17 MED ORDER — POTASSIUM CHLORIDE 10 MEQ/100ML IV SOLN
INTRAVENOUS | Status: AC
  Administered 2011-04-17: 10 meq via INTRAVENOUS
  Filled 2011-04-17: qty 100

## 2011-04-17 NOTE — Progress Notes (Signed)
NAMELEVI, CRASS              ACCOUNT NO.:  [id number]  MEDICAL RECORD NO.:  [id number]  LOCATION:  A334                          FACILITY:  [facility]  PHYSICIAN:  Meda Dudzinski G. Renard Matter, MD   DATE OF BIRTH:  1945-12-16  DATE OF PROCEDURE: DATE OF DISCHARGE:                                PROGRESS NOTE   This patient was admitted with edema and cellulitis in lower extremities.  This has improved significantly.  She was admitted with these issues for more aggressive treatment of the cellulitis and edema. She is on IV vancomycin and is diuresing.  Does have negative fluid balance and feeling some better.  She does have a problem low hemoglobin and hematocrit.  Most recent hemoglobin 8.0.  She did have 2 units of packed RBCs.  Her chemistries show low serum potassium at 3.2, her BUN 30, creatinine 1.62.  OBJECTIVE:  VITAL SIGNS:  Blood pressure 98/61, respirations 18, pulse 71, and temp 98.9. LUNGS:  Diminished breath sounds. HEART:  Regular rhythm. ABDOMEN:  No palpable organs or masses. EXTREMITIES:  The patient has some erythema of the skin of the distal extremities bilaterally with minimal edema now.  ASSESSMENT:  The patient does have history of bilateral edema in extremities and cellulitis in both lower extremities.  She does have additional problems now of low hemoglobin, slight elevation of creatinine.  She did have a stool collected today for occult blood testing.  PLAN:  To replete serum potassium.  We may need GI consult with reference to the anemia.  We will continue current regimen otherwise.     Silvio Sausedo G. Renard Matter, MD     AGM/MEDQ  D:  04/17/2011  T:  04/17/2011  Job:  [id number]

## 2011-04-17 NOTE — Progress Notes (Signed)
ANTIBIOTIC CONSULT NOTE -   Pharmacy Consult for Vancomycin Indication: Cellulitis  Allergies  Allergen Reactions  . Codeine Nausea And Vomiting and Rash    Patient Measurements: Height: 5\' 6"  (167.6 cm) Weight: 179 lb 0.2 oz (81.2 kg) IBW/kg (Calculated) : 59.3  Adjusted Body Weight:70  Vital Signs: Temp: 98.9 F (37.2 C) (09/24 0559) Temp src: Oral (09/23 2057) BP: 98/61 mmHg (09/24 0559) Pulse Rate: 71  (09/24 0559) Intake/Output from previous day: 09/23 0701 - 09/24 0700 In: 960 [P.O.:840; IV Piggyback:120] Out: 5800 [Urine:5800] Intake/Output from this shift:    Labs:  Basename 04/17/11 0448 04/16/11 0750 04/16/11 0410 04/15/11 0555  WBC -- -- -- 4.8  HGB -- 8.0* -- 7.5*  PLT -- -- -- 291  LABCREA -- -- -- --  CREATININE 1.62* -- 1.64* 1.75*  CRCLEARANCE -- -- -- --    Results for SAYDE, LISH (MRN [id number]) as of 04/17/2011 08:20  Ref. Range 04/16/2011 14:15  Vancomycin Tr Latest Range: 10.0-20.0 ug/mL 25.0 (H)    Microbiology: None  Medical History: Past Medical History  Diagnosis Date  . Type 2 diabetes mellitus   . Arthritis   . COPD (chronic obstructive pulmonary disease)     Home oxygen  . Palpitations   . Coronary atherosclerosis of native coronary artery     Nonobstructive at catheterization 2008  . GERD (gastroesophageal reflux disease)   . Irritable bowel syndrome   . Chronic anemia   . Hypothyroidism   . Hyperlipidemia   . Chronic edema   . Pneumonia   . Diabetes mellitus   . Hypertension     Medications:   Reviewed Assessment:  Empiric therapy for cellulitis. Renal function parameters improving. CrCl around 37 mls per minute. Vancomycin trough above goal.  Goal of Therapy:  Vancomycin trough level 10-15 mcg/ml  Plan:  Decrease Vancomycin to 1000mg  IV every 24 hours.  Tomi Bamberger J 04/17/2011,8:20 AM

## 2011-04-17 NOTE — Progress Notes (Addendum)
SUBJECTIVE: She states she is feeling better. She says that pain is significantly decreased in her legs.   Filed Vitals:   04/16/11 2000 04/16/11 2057 04/17/11 0500 04/17/11 0559  BP:  101/46  98/61  Pulse:  70  71  Temp:  99.4 F (37.4 C)  98.9 F (37.2 C)  TempSrc:  Oral    Resp:  18  18  Height:      Weight:   179 lb 0.2 oz (81.2 kg)   SpO2: 96% 94%  96%    Intake/Output Summary (Last 24 hours) at 04/17/11 0945 Last data filed at 04/17/11 0900  Gross per 24 hour  Intake    920 ml  Output   3800 ml  Net  -2880 ml  Wt:  179 lbs (admit 178, then 184 lbs)  LABS: Basic Metabolic Panel:  Basename 04/17/11 0448 04/16/11 0410  NA 139 139  K 3.2* 3.3*  CL 89* 90*  CO2 46* 44*  GLUCOSE 98 81  BUN 30* 27*  CREATININE 1.62* 1.64*  CALCIUM 9.3 9.1  MG -- --  PHOS -- --   Liver Function Tests:  [facility] 04/15/11 0555  AST 18  ALT 6  ALKPHOS 42  BILITOT 0.4  PROT 5.9*  ALBUMIN 3.0*   CBC:  Basename 04/16/11 0750 04/15/11 0555  WBC -- 4.8  NEUTROABS -- --  HGB 8.0* 7.5*  HCT 25.8* 24.5*  MCV -- 92.5  PLT -- 291   RADIOLOGY: Dg Chest 2 View  03/23/2011  *RADIOLOGY REPORT*  Clinical Data: Productive cough  CHEST - 2 VIEW  Comparison: 08/15/2010  Findings: Stable cardiomegaly and vascular congestion.  Negative for pneumonia, edema, collapse, consolidation, effusion or pneumothorax.  Mild hyperinflation.  IMPRESSION: Cardiomegaly without CHF or pneumonia.  Original Report Authenticated By: Judie Petit. Ruel Favors, M.D.   Dg Hip Complete Left  04/03/2011  *RADIOLOGY REPORT*  Clinical Data: Status post fall; left hip pain and bilateral leg swelling and erythema.  LEFT HIP - COMPLETE 2+ VIEW  Comparison: None.   IMPRESSION: No evidence of fracture or dislocation.  Original Report Authenticated By: Tonia Ghent, M.D.   Dg Chest Portable 1 View  04/13/2011  *RADIOLOGY REPORT*  Clinical Data: .  IMPRESSION: Enlargement of cardiac silhouette. No acute abnormalities.  Original  Report Authenticated By: Lollie Marrow, M.D.   Dg Chest Port 1v Same Day  04/16/2011  *RADIOLOGY REPORT*  Clinical Data: Cough and fever  PORTABLE CHEST - 1 VIEW SAME DAY  Comparison: 04/13/2011  Findings: Heart size upper limits normal.  Lungs clear.  No definite effusion although the left lateral costophrenic angle is excluded.  Regional bones unremarkable.  IMPRESSION:  1.  No acute disease  Original Report Authenticated By: Osa Craver, M.D.   Dg Knee Complete 4 Views Left  04/03/2011  *RADIOLOGY REPORT*  Clinical Data: Status post multiple falls; swelling and erythema at both lower legs, and left hip pain.  LEFT KNEE - COMPLETE 4+ VIEW   IMPRESSION:  1.  No evidence of fracture or dislocation. 2.  Question of a small loose body projecting at the medial compartment. 3.  Mild scattered vascular calcifications seen. 4.  Soft tissue swelling noted anterior to the patellar tendon.  Original Report Authenticated By: Tonia Ghent, M.D.    PHYSICAL EXAM General: Well developed, well nourished, in no acute distress Head: Eyes PERRLA, No xanthomas.   Normal cephalic and atramatic  Lungs: Clear bilaterally to auscultation and percussion. Heart: HRRR S1 S2,  Pulses are 2+ & equal.            No carotid bruit. No JVD.  No abdominal bruits. No femoral bruits. Abdomen: Bowel sounds are positive, abdomen soft and non-tender without masses or                  Hernia's noted. Msk:  Back normal, normal gait. Normal strength and tone for age. Extremities: No clubbing, cyanosis or edema.  DP +1 Neuro: Alert and oriented X 3. Psych:  Good affect, responds appropriately  TELEMETRY: Reviewed telemetry pt in NSR rate of 72 bpm  ASSESSMENT AND PLAN:  1. Lower Extremity Edema: Much improved, less redness and edema is resolved.  No pain on palpation.  2. Diastolic Heart Failure:  Wt is back to admission wt prior to IV fluids. Lungs continue to have bibasilar crackles. Will repeat her CXR.  CBC and  BMET will be ordered today.   3. Anemia:  She is to receive 2 units of blood today.  Follow-up CBC in AM.   Bettey Mare. Lyman Bishop NP [facility]  Cardiology Attending  Patient interviewed and examined. Discussed with Joni Reining, NP.  Above note annotated and modified based upon my findings.  Net diuresis of 12 liters according to I/O, but weight is unchanged from admission.  Pt believes that much of her fluid intake may not be recorded. She is feeling better and is mentally brighter. She has had a progressive anemia requiring transfusion, has previously been evaluated by Dr. [name] and might benefit from a repeat hematology consultation. Renal function has improved despite use of intravenous diuretics, but is not back to baseline. A reasonable fluid restriction will be ordered.  [place] Bing, MD

## 2011-04-17 NOTE — Progress Notes (Signed)
Nutrition Note  Weight loss noted per admission hx assessment. Pt describes reason for wt loss, "I just decided to cut back on what I was eating." Her appetite is good po's 75-100% consumed per nursing. No additional nutr intervention indicated at this time.

## 2011-04-18 LAB — CBC
HCT: 28.3 % — ABNORMAL LOW (ref 36.0–46.0)
Hemoglobin: 9 g/dL — ABNORMAL LOW (ref 12.0–15.0)
MCH: 28.7 pg (ref 26.0–34.0)
MCV: 90.1 fL (ref 78.0–100.0)
RBC: 3.14 MIL/uL — ABNORMAL LOW (ref 3.87–5.11)

## 2011-04-18 LAB — GLUCOSE, CAPILLARY: Glucose-Capillary: 105 mg/dL — ABNORMAL HIGH (ref 70–99)

## 2011-04-18 LAB — BASIC METABOLIC PANEL
BUN: 34 mg/dL — ABNORMAL HIGH (ref 6–23)
Calcium: 9.2 mg/dL (ref 8.4–10.5)
GFR calc non Af Amer: 33 mL/min — ABNORMAL LOW (ref >60)
Glucose, Bld: 113 mg/dL — ABNORMAL HIGH (ref 70–99)
Potassium: 3 mEq/L — ABNORMAL LOW (ref 3.5–5.1)

## 2011-04-18 MED ORDER — POTASSIUM CHLORIDE CRYS ER 20 MEQ PO TBCR
40.0000 meq | EXTENDED_RELEASE_TABLET | Freq: Every day | ORAL | Status: DC
  Administered 2011-04-18: 40 meq via ORAL
  Filled 2011-04-18: qty 2

## 2011-04-18 MED ORDER — FUROSEMIDE 40 MG PO TABS
40.0000 mg | ORAL_TABLET | Freq: Every day | ORAL | Status: DC
  Administered 2011-04-18 – 2011-04-19 (×2): 40 mg via ORAL
  Filled 2011-04-18 (×2): qty 1

## 2011-04-18 MED ORDER — POTASSIUM CHLORIDE CRYS ER 20 MEQ PO TBCR
40.0000 meq | EXTENDED_RELEASE_TABLET | Freq: Once | ORAL | Status: AC
  Administered 2011-04-18: 40 meq via ORAL

## 2011-04-18 MED ORDER — POTASSIUM CHLORIDE CRYS ER 20 MEQ PO TBCR
40.0000 meq | EXTENDED_RELEASE_TABLET | Freq: Once | ORAL | Status: AC
  Administered 2011-04-18: 40 meq via ORAL
  Filled 2011-04-18 (×2): qty 2

## 2011-04-18 MED ORDER — POTASSIUM CHLORIDE CRYS ER 20 MEQ PO TBCR
20.0000 meq | EXTENDED_RELEASE_TABLET | Freq: Every day | ORAL | Status: DC
  Administered 2011-04-19 – 2011-04-20 (×2): 20 meq via ORAL
  Filled 2011-04-18 (×2): qty 1

## 2011-04-18 NOTE — Progress Notes (Signed)
Md called with critical value, no new orders at this time.

## 2011-04-18 NOTE — Progress Notes (Signed)
CRITICAL VALUE ALERT  Critical value received: CO2 Date of notification: 04/17/2011  Time of notification: 2330 Critical value read back:yes  Nurse who received alert: A. Peri Kreft, RN  MD notified (1st page):Dr. Ouida Sills Time of first page: 2332 MD notified (2nd page):  Time of second page:  Responding MD: Dr. Ouida Sills Time MD responded:2332

## 2011-04-18 NOTE — Progress Notes (Signed)
Kara [name], Kara [name]              ACCOUNT NO.:  [id number]  MEDICAL RECORD NO.:  [id number]  LOCATION:  A334                          FACILITY:  [facility]  PHYSICIAN:  Javion Holmer G. Renard Matter, MD   DATE OF BIRTH:  [birth year]  DATE OF PROCEDURE: DATE OF DISCHARGE:                                PROGRESS NOTE   The patient had been treated for edema and cellulitis in her lower extremities.  She has significantly improved.  Has negative fluid balance and is feeling better.  She has had a problem with low hemoglobin and had received transfusions.  The stool collected for testing for occult blood was negative.  She did have a low serum potassium which was repleted.  Her current serum potassium is 3.4.  She does have diastolic heart failure and diuresed at 12 liters.  OBJECTIVE:  VITAL SIGNS:  Blood pressure 91/54, respirations 18, pulse 63, temperature 98. LUNGS:  Diminished breath sounds. HEART:  Regular rhythm. ABDOMEN:  No palpable organs or masses. EXTREMITIES:  The patient has some erythema in the skin of distal extremities with minimal edema now.  ASSESSMENT:  The patient does have a history of bilateral edema in extremities and cellulitis of both lower extremities.  Does have additional problem of low hemoglobin and elevated creatinine, has been transfused, was felt to have diastolic heart failure.  We will continue to monitor her hemoglobin/hematocrit and obtain appropriate consult with Dr. [name] if needed. We will hold off on the GI consult for now.     [name] G. Renard Matter, MD     AGM/MEDQ  D:  04/18/2011  T:  04/18/2011  Job:  [id number]

## 2011-04-18 NOTE — Progress Notes (Signed)
Pt had elevated HR of 140 on tele monitor.  Vitals checked -  When in room BP 90/52 (cardizem held this AM) and HR 83.  Pt states she feels fine, will continue to monitor.  All leads correctly placed for monitor at this time.

## 2011-04-18 NOTE — Consult Note (Signed)
SUBJECTIVE: Feels better, but complains of back pain overnight.  Legs are less painful.  No dyspnea nor chest discomfort.   Filed Vitals:   04/17/11 1638 04/17/11 1730 04/17/11 2300 04/18/11 0654  BP: 110/59 92/53 91/54  91/54  Pulse: 69 66 63 60  Temp: 99.2 F (37.3 C) 98.6 F (37 C) 98.9 F (37.2 C) 97.8 F (36.6 C)  TempSrc: Oral Oral    Resp: 18 18 18 20   Height:      Weight:      SpO2:  96% 96% 96%    Intake/Output Summary (Last 24 hours) at 04/18/11 0802 Last data filed at 04/18/11 0400  Gross per 24 hour  Intake   2050 ml  Output   3001 ml  Net   -951 ml    LABS: Basic Metabolic Panel:  Basename 04/18/11 0550 04/17/11 2203  NA 137 133*  K 3.0* 3.4*  CL 87* 85*  CO2 44* 42*  GLUCOSE 113* 155*  BUN 34* 33*  CREATININE 1.59* 1.63*  CALCIUM 9.2 9.1  MG -- --  PHOS -- --   Liver Function Tests: CBC:  Basename 04/18/11 0550 04/17/11 2203  WBC 5.8 --  NEUTROABS -- --  HGB 9.0* 9.4*  HCT 28.3* 29.2*  MCV 90.1 --  PLT 304 --   RADIOLOGY:   04/16/2011  *RADIOLOGY REPORT*  Clinical Data: Cough and fever  PORTABLE CHEST - 1 VIEW SAME DAY  Comparison: 04/13/2011  Findings: Heart size upper limits normal.  Lungs clear.  No definite effusion although the left lateral costophrenic angle is excluded.  Regional bones unremarkable.  IMPRESSION:  1.  No acute disease  Original Report Authenticated By: Osa Craver, M.D.   PHYSICAL EXAM General: Well developed, well nourished, in no acute distress Head: Eyes PERRLA, No xanthomas.   Normal cephalic and atramatic  Lungs: Clear bilaterally to auscultation and percussion. Heart: HRRR S1 S2, with soft S4 murmur.  Pulses are 2+ & equal.            No carotid bruit. No JVD.  No abdominal bruits. No femoral bruits. Abdomen: Bowel sounds are positive, abdomen soft and non-tender without masses or                  Hernia's noted. Msk:  Back normal, normal gait. Normal strength and tone for age. Extremities: No  clubbing, cyanosis or edema.  DP +1 Neuro: Alert and oriented X 3. Psych:  Good affect, responds appropriately  ASSESSMENT AND PLAN:  1.Lower extremity edema: Essentially resolved.  Will change lasix from IV to 40 mg po daily.  She is hypokalemic.  Plan to replete with 40 mEq today and daily.  Repeat BMET in am.  2. A/C Diastolic Heart Failure: She appears euvolemic today on assessment.  No wt completed yet.  She is on fluid restriction at present.   Will follow.  Per patient, planned discharge of Thursday.  Bettey Mare. Lyman Bishop NP [facility] ---------------------------------  Cardiology Attending  Patient interviewed and examined. Discussed with Joni Reining, NP.  Above note annotated and modified based upon my findings.  Patient is markedly improved with resolution of mild pulmonary edema, resolution of all cardiopulmonary symptoms, and marked improvement in peripheral edema and cellulitis.   I&O continues to be markedly negative without a concomitant decrease in weight. Hypokalemia will be treated with supplemental potassium. Agree with plans for discharge and will arrange outpatient followup. [place] Bing, MD

## 2011-04-19 ENCOUNTER — Inpatient Hospital Stay (HOSPITAL_COMMUNITY): Payer: Medicaid Other

## 2011-04-19 LAB — COMPREHENSIVE METABOLIC PANEL
ALT: 12
AST: 17
Alkaline Phosphatase: 29 — ABNORMAL LOW
CO2: 29
Glucose, Bld: 201 — ABNORMAL HIGH
Potassium: 3.4 — ABNORMAL LOW
Sodium: 140
Total Protein: 6.4

## 2011-04-19 LAB — BASIC METABOLIC PANEL
CO2: 42 mEq/L (ref 19–32)
CO2: 32
Chloride: 93 mEq/L — ABNORMAL LOW (ref 96–112)
GFR calc non Af Amer: 35 mL/min — ABNORMAL LOW (ref >60)
Glucose, Bld: 98 mg/dL (ref 70–99)
Glucose, Bld: 138 — ABNORMAL HIGH
Potassium: 4.4 mEq/L (ref 3.5–5.1)
Potassium: 4
Sodium: 139 mEq/L (ref 135–145)
Sodium: 140

## 2011-04-19 LAB — DIFFERENTIAL
Basophils Absolute: 0
Eosinophils Absolute: 0.1
Lymphs Abs: 1.7
Neutro Abs: 6.9

## 2011-04-19 LAB — URINE MICROSCOPIC-ADD ON

## 2011-04-19 LAB — T4, FREE: Free T4: 1.44

## 2011-04-19 LAB — CBC
HCT: 33.1 — ABNORMAL LOW
Hemoglobin: 11.4 — ABNORMAL LOW
MCHC: 34.4
RDW: 13.1

## 2011-04-19 LAB — GLUCOSE, CAPILLARY
Glucose-Capillary: 121 mg/dL — ABNORMAL HIGH (ref 70–99)
Glucose-Capillary: 198 mg/dL — ABNORMAL HIGH (ref 70–99)

## 2011-04-19 LAB — CARDIAC PANEL(CRET KIN+CKTOT+MB+TROPI)
Relative Index: INVALID
Total CK: 21
Total CK: 21
Total CK: 21
Troponin I: 0.02

## 2011-04-19 LAB — URINALYSIS, ROUTINE W REFLEX MICROSCOPIC
Bilirubin Urine: NEGATIVE
Ketones, ur: NEGATIVE
Nitrite: NEGATIVE
Specific Gravity, Urine: <1.005 — ABNORMAL LOW
Urobilinogen, UA: 1

## 2011-04-19 MED ORDER — GUAIFENESIN 100 MG/5ML PO SOLN
20.0000 mL | Freq: Two times a day (BID) | ORAL | Status: DC
  Administered 2011-04-19 – 2011-04-20 (×3): 400 mg via ORAL
  Filled 2011-04-19: qty 20

## 2011-04-19 MED ORDER — FUROSEMIDE 80 MG PO TABS
80.0000 mg | ORAL_TABLET | Freq: Every day | ORAL | Status: DC
  Administered 2011-04-20: 80 mg via ORAL
  Filled 2011-04-19: qty 1

## 2011-04-19 NOTE — Progress Notes (Signed)
Subjective:  Complains of fast heartbeat while I was listening to her. No evidence of tachycardia on exam or telemetry. Breathing much better.  Objective:  Vital Signs in the last 24 hours: Temp:  [98 F (36.7 C)-98.3 F (36.8 C)] 98 F (36.7 C) (09/26 1610) Pulse Rate:  [72-83] 74  (09/26 0633) Resp:  [16-22] 22  (09/26 0633) BP: (90-106)/(50-64) 106/64 mmHg (09/26 0633) SpO2:  [95 %-100 %] 98 % (09/26 0633)  Intake/Output from previous day: 09/25 0701 - 09/26 0700 In: 940 [P.O.:940] Out: 2350 [Urine:2350] Intake/Output from this shift:    Physical Exam: NECK: Without JVD, HJR, or bruit LUNGS: Decreased breath sounds,Clear anterior, posterior, lateral HEART: Regular rate and rhythm, 2/6 systolic murmur, positive S4, no rub, bruit, thrill, or heave EXTREMITIES:+1 edema bilaterally the lower extremities, Without cyanosis, clubbing,    Lab Results:  [facility] 04/18/11 0550 04/17/11 2203  WBC 5.8 --  HGB 9.0* 9.4*  PLT 304 --    Basename 04/19/11 0541 04/18/11 0550  NA 139 137  K 4.4 3.0*  CL 93* 87*  CO2 42* 44*  GLUCOSE 98 113*  BUN 33* 34*  CREATININE 1.50* 1.59*    Assessment/Plan:  Diastolic heart failure:patient continues to diurese on oral Lasix.  Weights don't seem to be accurate but down at least 8 pounds.  Potassium replaced.  Discharge planned for tomorrow. Palpitations with history of atrial fibrillation: no evidence of arrhythmia Hypertension:  BP control is excellent. Coronary artery disease:  No symptoms to suggest myocardial ischemia. COPD-no evidence for bronchospasm.  Jacolyn Reedy 04/19/2011, 9:14 AM __________________________  Cardiology Attending  Patient interviewed and examined. Discussed with Ms. Geni Bers, [facility].  Above note annotated and modified based upon my findings.  Weight increased 5 kg today despite a recorded diuresis of 2000 cc over the past 2 days by I&O.  Patient is improved clinically.  Agree with plans for discharge.  We can  accurately follow weight and symptomatic status once she is discharged.  [place] Bing, MD

## 2011-04-19 NOTE — Progress Notes (Signed)
[name], Kara [name]              ACCOUNT NO.:  [id number]  MEDICAL RECORD NO.:  [id number]  LOCATION:  A334                          FACILITY:  [facility]  PHYSICIAN:  Ellakate Gonsalves G. Renard Matter, MD   DATE OF BIRTH:  02-27-1946  DATE OF PROCEDURE: DATE OF DISCHARGE:                                PROGRESS NOTE   This patient is being treated for edema and cellulitis in her lower extremities.  She has significantly improved, is feeling better.  She did have problem with low hemoglobin and had received transfusions.  She did have low serum potassium, which was repleted.  She does have diastolic heart failure and has diuresed.  She has been seen by Cardiology who is noted that the lower extremity edema has essentially resolved with reference to her diastolic heart failure.  She appears euvolemic.  OBJECTIVE:  VITAL SIGNS:  Blood pressure 90/50, respirations 18, pulse 76, temperature 98.3. LUNGS:  Diminished breath sounds. HEART:  Regular rhythm. ABDOMEN:  No palpable organs or masses. EXTREMITIES:  The patient does have some erythema of the skin in distal extremities with minimal edema now.  Her current hemoglobin 9.0, hematocrit 28.3, potassium yesterday was 3.0.  ASSESSMENT:  The patient has improved.  Does have a history of bilateral edema and cellulitis in both lower extremities.  Has had low hemoglobin and elevated creatinine, is felt to be in diastolic heart failure.  She did have low serum potassium.  We will recheck BMET.  I have ordered compression stockings to her legs.  Start gradual ambulation.     Riyana Biel G. Renard Matter, MD     AGM/MEDQ  D:  04/19/2011  T:  04/19/2011  Job:  [id number]

## 2011-04-20 LAB — BASIC METABOLIC PANEL
Chloride: 93 mEq/L — ABNORMAL LOW (ref 96–112)
GFR calc non Af Amer: 37 mL/min — ABNORMAL LOW (ref >60)
Glucose, Bld: 102 mg/dL — ABNORMAL HIGH (ref 70–99)
Potassium: 4.1 mEq/L (ref 3.5–5.1)
Sodium: 138 mEq/L (ref 135–145)

## 2011-04-20 LAB — TYPE AND SCREEN
ABO/RH(D): O NEG
Unit division: 0

## 2011-04-20 MED ORDER — FENOFIBRATE 160 MG PO TABS: 160.0000 mg | ORAL_TABLET | Freq: Every day | ORAL | Status: DC

## 2011-04-20 NOTE — Discharge Summary (Signed)
Kara [name], Kara [name]              ACCOUNT NO.:  [id number]  MEDICAL RECORD NO.:  [id number]  LOCATION:  A334                          FACILITY:  [facility]  PHYSICIAN:  Jull Harral G. Renard Matter, MD   DATE OF BIRTH:  Apr 05, 1946  DATE OF ADMISSION:  04/13/2011 DATE OF DISCHARGE:  09/27/2012LH                              DISCHARGE SUMMARY   This is a 65 year old white female, admitted on April 13, 2011, discharged on April 20, 2011, seven days' hospitalization.  DIAGNOSES: 1. Bilateral edema and cellulitis of both lower extremities. 2. Diabetes mellitus, type 2. 3. Chronic obstructive pulmonary disease. 4. Diastolic congestive heart failure. 5. Gastroesophageal reflux disease. 6. Irritable bowel syndrome. 7. Hypothyroidism. 8. Hyperlipidemia. 9. Hypertension. 10.Coronary artery disease. 11.History of atrial fibrillation.  CONDITION:  Stable and improved.  This patient has a long-standing history of edema in both lower extremities along with cellulitis.  She had been treated with diuretics and antibiotics over a period of time and still complained of these problems.  She was seen in consultation by Cardiology, Dr. Diona Browner, prior to admission and is a former patient of Dr. Daleen Squibb and Dr. Eden Emms. She gave a history of several months of progressive lower extremity edema and a tight feeling in her legs and had some weeping and erythema which have continued to worsen.  Her creatinine was slightly increased. Previous cardiac catheterization demonstrated mildly increased pulmonary pressures, possible component of cor pulmonale, does have an abrasion of the lower leg from recent fall and had been having intermittent episodes of chest pain over the past few months.  She was admitted for more intensive care.  OBJECTIVE:  VITAL SIGNS:  An alert white female with blood pressure 92/46 on admission, pulse 61, temperature 98. HEENT:  Eyes PERRLA.  TMs negative.  Oropharynx benign. NECK:   Supple.  No JVD or thyroid abnormalities. HEART:  Regular rhythm.  No murmurs. LUNGS:  No rhonchi or wheezes noted. ABDOMEN:  No palpable organs or masses.  No organomegaly. NEUROLOGIC:  The patient is alert.  No motor or sensory dysfunction or cranial nerve abnormality. EXTREMITIES:  Bilateral 2-3 plus edema and inflammation of the lower legs bilaterally.  LABORATORY DATA:  Which is pertinent.  CBC on admission:  WBC 7.5, hemoglobin 8.8, hematocrit 29.9, and on Dec 17, 2007, WBC was 9.2, hemoglobin 11.4, hematocrit 33.1.  Chemistries on admission:  Sodium 138, potassium 4.1, chloride 93, CO2 of 44 and on April 15, 2011, sodium 140, potassium 3.4, chloride 93, CO2 of 45.  UA negative.  X-RAYS:  Chest x-ray on admission, enlargement of cardiac silhouette, no acute abnormalities. X-ray of left hip, no acute findings, mild left hip osteoarthritis.  HOSPITAL COURSE:  The patient at the time of admission was placed at bedrest.  She was started on IV normal saline and IV vancomycin of 1000 mg every 24 hours.  She was continued on following medications:  Biotene mouthwash, BuSpar 10 mg daily, Celexa 20 mg b.i.d., Cardizem CD 120 mg daily, Lovenox 40 mg subcutaneously daily for DVT prophylaxis, fenofibrate 160 mg daily, Lasix IV 80 mg daily, Neurontin 300 mg b.i.d., guaifenesin 400 mg b.i.d., Lantus insulin 2 units t.i.d., levothyroxine 100 mg daily with  breakfast, Amitiza 24 mcg b.i.d., MS Contin 30 mg every 8 hours, Protonix 80 mg daily, MiraLax 17 g b.i.d., Klor-Con 20 mEq daily, and IV vancomycin according to pharmaceutical protocol.  The patient began diuresing on admission and continued to diurese throughout her hospital stay with the appropriate decrease in edema in her lower extremities along with the inflammation.  Towards the latter part of hospitalization, she was placed on TED hose.  She was seen in consultation by Cardiology.  She did have to have transfusions  of approximately 4 units of packed RBCs because of a low hemoglobin.  Her stools were checked for blood but were negative.  She did have a 2-D echo done which was essentially normal, but she was felt by Cardiology to have evidence of diastolic dysfunction.  A 2-D echo showed an ejection fraction in the range of 50-55%, mildly dilated right atrium. As stated, the patient showed progressive improvement, was able to be gradually ambulated towards the latter part of the hospitalization and was discharged on the following meds.  She was asked to follow up with physician as an outpatient.  Alprazolam 1 mg t.i.d., BuSpar 10 mg daily, Celexa 20 mg b.i.d., Dexilant 60 mg daily, furosemide 20 mg b.i.d., gabapentin 300 mg two times a day, levothyroxine one daily 100 mcg, metformin 1000 mg b.i.d., morphine 30 mg three times a day p.r.n., nitroglycerin 0.4 mg, potassium chloride 10 mEq daily, Trilipix 135 mg daily, zolpidem 10 mg at bedtime as needed.  The patient was stable at the time of discharge.     Kjuan Seipp G. Renard Matter, MD     AGM/MEDQ  D:  04/20/2011  T:  04/20/2011  Job:  [id number]

## 2011-04-20 NOTE — Progress Notes (Signed)
CRITICAL VALUE ALERT  Critical value received:  CO2 of 44  Date of notification:  04/20/2011  Time of notification:  05:35   Critical value read back:yes  Nurse who received alert:  Rhae Hammock, RN  MD notified (1st page):  Will talk to Dr. Renard Matter when he comes in  Time of first page:  None  MD notified (2nd page):  Time of second page:  Responding MD:  None  Time MD responded:  None

## 2011-04-20 NOTE — Progress Notes (Signed)
D/c instructions reviewed with patient and husband. Verbalized understanding.  Pt dc'd to home with husband.  [place], Candelaria Stagers 04/20/2011.

## 2011-04-24 ENCOUNTER — Telehealth: Payer: Self-pay | Admitting: Adult Health

## 2011-04-24 NOTE — Telephone Encounter (Signed)
Pt

## 2011-04-24 NOTE — Telephone Encounter (Signed)
NURSE CALLING TODAY TO LET us KNOW THAT SHE HAS 2+ EDMA IN LEGS FROM KNEES DOWN ALSO RED AND ACHING.  PT IS ONLY IN 20MG  LASIX 2 TIMES A DAY SHE IS WANTING TO KNOW IF SHE SHOULD INCREASE THIS.

## 2011-04-24 NOTE — Telephone Encounter (Signed)
Pt. was last seen by Dr. Diona Browner on 04-13-11  His notes on edema is as follows:  Significant degree of edema noted, symmetrical overall. Does not appear to be related to systolic dysfunction based on her most recent echocardiogram. Possibility of at least a component of cor pulmonale to be considered in light of her chronic lung disease. Also would be suspicious about associated lymphedema. Deep venous thrombosis was excluded by recent Dopplers. Her blood pressure is on the low side with increasing doses of diuretic, and concern is that she may be becoming intravascularly depleted. I discussed the situation with Dr. Renard Matter and would recommend inpatient evaluation with wound care consultation, careful intravenous diuresis, treatment with antibiotics if appropriate, and potentially compression dressings. Would also assess TSH and protein status, urinalysis. CT imaging of the soft tissues of the lower extremities would also be a consideration.  Pt. was admitted to [facility] on day of this visit per Dr. Diona Browner and Dr. Renard Matter.  Please advise./LV

## 2011-04-25 ENCOUNTER — Encounter: Payer: Self-pay | Admitting: Adult Health

## 2011-04-25 NOTE — Progress Notes (Unsigned)
Labs reviewed ordered by Dr. Diona Browner for CBC on this patient.  Hgb: 8.1/Hct 27.8,  MCHC 29.1.  Dr. Renard Matter is PCP. I have called him to inform lab results.  I will fax a copy to him.  He is supposed to see her on follow-up soon.  Joni Reining NP

## 2011-05-03 LAB — POCT I-STAT 3, VENOUS BLOOD GAS (G3P V)
O2 Saturation: 47
TCO2: 33
pCO2, Ven: 67.3 — ABNORMAL HIGH

## 2011-05-03 LAB — POCT I-STAT 3, ART BLOOD GAS (G3+)
Acid-Base Excess: 9 — ABNORMAL HIGH
Bicarbonate: 35.7 — ABNORMAL HIGH
Operator id: 135831

## 2011-05-08 ENCOUNTER — Encounter: Payer: Self-pay | Admitting: Cardiology

## 2011-05-10 ENCOUNTER — Encounter: Payer: Self-pay | Admitting: Cardiology

## 2011-05-10 ENCOUNTER — Ambulatory Visit (INDEPENDENT_AMBULATORY_CARE_PROVIDER_SITE_OTHER): Payer: Medicaid Other | Admitting: Cardiology

## 2011-05-10 VITALS — BP 96/62 | HR 70 | Resp 16 | Ht 66.0 in | Wt 172.0 lb

## 2011-05-10 DIAGNOSIS — R609 Edema, unspecified: Secondary | ICD-10-CM

## 2011-05-10 DIAGNOSIS — I2510 Atherosclerotic heart disease of native coronary artery without angina pectoris: Secondary | ICD-10-CM

## 2011-05-10 DIAGNOSIS — J449 Chronic obstructive pulmonary disease, unspecified: Secondary | ICD-10-CM

## 2011-05-10 DIAGNOSIS — R002 Palpitations: Secondary | ICD-10-CM

## 2011-05-10 NOTE — Assessment & Plan Note (Signed)
Patient reports atrial fibrillation by history, although this has not been seen recently. Heart rate is in the 70s in sinus rhythm on examination. Continue beta blocker therapy. Will not plan to resume diltiazem at this point. Continue to observe.

## 2011-05-10 NOTE — Patient Instructions (Addendum)
Your physician recommends that you continue on your current medications as directed. Please refer to the Current Medication list given to you today.  Your physician recommends that you return for lab work in: the week of June 12, 2011  Your physician recommends that you schedule a follow-up appointment in: 6 weeks

## 2011-05-10 NOTE — Assessment & Plan Note (Signed)
Chronic, although significantly improved compared to her last visit. Weight is down 5 pounds. I note that her Lasix dose has been increased to 60 mg twice daily since discharge, by Dr. Renard Matter. We discussed the importance of sodium and excess fluid restriction, obtaining compression stockings that fit and using them regularly, maintaining regular oxygen use for her COPD to avoid hypoxia, and also being consistent with diuretic therapy. Indeed, diuresis could be further increased as if necessary. Edema is probably multifactorial, perhaps reflecting element of diastolic dysfunction (rather than systolic congestive heart failure), and also potentially component of cor pulmonale in the setting of oxygen requiring COPD. Follow up arranged with BMET and magnesium.

## 2011-05-10 NOTE — Progress Notes (Signed)
Clinical Summary Kara [name] is a 65 y.o.female presenting for a post-hospital visit. She was admitted in September with leg edema and cellulitis with possible component of diastolic dysfunction and cor pulmonale. She was treated with antibiotics, diuresed, and TED hose were recommended. Home health also arranged.  Labs from 9/27 showed potassium 4.7, BUN 32, creatinine 1.4.  She is here today with her sister. We had a detailed discussion regarding recent treatment strategies, medication adjustments, and her echocardiogram from late August. She has an LVEF of approximately 55% without regional wall motion abnormalities and with mild LVH (PA pressure not assessed, and RV function described as normal).  She had questions about the status of her heart, and we discussed her cardiac catheterization results from 2008, the fact that she does not have evidence of systolic congestive heart failure, and the possibility of diastolic dysfunction as well as potentially a component of cor pulmonale in the setting of COPD as contributing to her edema.  She has had significant improvement in her edema with diuretics. She has not been wearing compression hose, stating that they have not been able to fit. She reports a pending visit with [facility] in [place].  I note that she is no longer on Cardizem, although heart rate seems reasonably controlled today, and blood pressure remains on the low side. She did not bring in her medications for review, and we discussed her calling back to the office to make sure that her current list is correct.   Allergies  Allergen Reactions  . Codeine Nausea And Vomiting and Rash    Medication list reviewed.  Past Medical History  Diagnosis Date  . Type 2 diabetes mellitus   . Arthritis   . COPD (chronic obstructive pulmonary disease)     Home oxygen  . Palpitations     Reports history of atrial fibrillation, although none seen recently  . Coronary  atherosclerosis of native coronary artery     Nonobstructive at catheterization 2008  . GERD (gastroesophageal reflux disease)   . Irritable bowel syndrome   . Chronic anemia   . Hypothyroidism   . Hyperlipidemia   . Chronic edema   . Pneumonia   . Diabetes mellitus   . Hypertension   . Chronic pain     Social History Kara [name] reports that she has never smoked. She has never used smokeless tobacco. Kara [name] reports that she does not drink alcohol.  Review of Systems Complains of weakness and fatigue. States that she has been wearing her oxygen. Does have palpitations and feelings that her heart increases at times. Recent telemetry and ECGs did not show atrial fibrillation. No obvious angina. No fevers or chills. Reports improved and edema in general.  Physical Examination Filed Vitals:   05/10/11 1343  BP: 96/62  Pulse: 70  Resp: 16    Chronically ill appearing woman wearing oxygen in no acute distress.  HEENT: Conjunctiva and lids normal, oropharynx with moist mucosa.  Neck: Supple, no elevated JVP or carotid bruits.  Lungs: Diminished breath sounds throughout, no wheezing.  Cardiac: Distant regular heart sounds with occasional ectopic beat, no S3 gallop or pericardial rub or knock. Abdomen: Soft, nontender, bowel sounds present.  Extremities: Chronic appearing edema below the knees bilaterally, pitting, no active weeping, appears much better than on previous exam. Mild erythema but no increased warmth. Skin: Otherwise warm and dry.  Neuropsychiatric: Alert and oriented x3, not certain about her short-term memory.   Problem List and Plan

## 2011-05-10 NOTE — Assessment & Plan Note (Signed)
Oxygen requiring, followed by Dr. Renard Matter.

## 2011-05-10 NOTE — Assessment & Plan Note (Signed)
Nonobstructive at cardiac catheterization in 2008. Troponin I level normal during recent hospital stay.

## 2011-06-09 ENCOUNTER — Encounter: Payer: Self-pay | Admitting: Cardiology

## 2011-06-21 ENCOUNTER — Ambulatory Visit: Payer: Medicaid Other | Admitting: Cardiology

## 2011-07-07 ENCOUNTER — Other Ambulatory Visit (HOSPITAL_COMMUNITY): Payer: Self-pay | Admitting: Family Medicine

## 2011-07-07 DIAGNOSIS — Z139 Encounter for screening, unspecified: Secondary | ICD-10-CM

## 2011-07-10 ENCOUNTER — Ambulatory Visit (INDEPENDENT_AMBULATORY_CARE_PROVIDER_SITE_OTHER): Payer: Medicare Other | Admitting: Cardiology

## 2011-07-10 ENCOUNTER — Encounter: Payer: Self-pay | Admitting: Cardiology

## 2011-07-10 ENCOUNTER — Ambulatory Visit (HOSPITAL_COMMUNITY)
Admission: RE | Admit: 2011-07-10 | Discharge: 2011-07-10 | Disposition: A | Discharge status: Home / Self Care | Payer: Medicare Other | Source: Ambulatory Visit | Attending: Family Medicine | Admitting: Family Medicine

## 2011-07-10 VITALS — BP 89/49 | HR 65 | Resp 16 | Ht 66.0 in | Wt 155.0 lb

## 2011-07-10 DIAGNOSIS — Z139 Encounter for screening, unspecified: Secondary | ICD-10-CM

## 2011-07-10 DIAGNOSIS — R600 Localized edema: Secondary | ICD-10-CM

## 2011-07-10 DIAGNOSIS — R609 Edema, unspecified: Secondary | ICD-10-CM

## 2011-07-10 DIAGNOSIS — Z1231 Encounter for screening mammogram for malignant neoplasm of breast: Secondary | ICD-10-CM | POA: Insufficient documentation

## 2011-07-10 DIAGNOSIS — I5032 Chronic diastolic (congestive) heart failure: Secondary | ICD-10-CM

## 2011-07-10 DIAGNOSIS — I2510 Atherosclerotic heart disease of native coronary artery without angina pectoris: Secondary | ICD-10-CM

## 2011-07-10 DIAGNOSIS — I509 Heart failure, unspecified: Secondary | ICD-10-CM

## 2011-07-10 DIAGNOSIS — I10 Essential (primary) hypertension: Secondary | ICD-10-CM

## 2011-07-10 DIAGNOSIS — J449 Chronic obstructive pulmonary disease, unspecified: Secondary | ICD-10-CM

## 2011-07-10 NOTE — Assessment & Plan Note (Signed)
Blood pressure is low, she denies active dizziness or syncope. This limits further medical therapy. I explained that we may ultimately need to cut back on her diuretic dose.

## 2011-07-10 NOTE — Assessment & Plan Note (Signed)
Oxygen requiring, also likely impacts her feelings of shortness of breath.

## 2011-07-10 NOTE — Progress Notes (Signed)
Clinical Summary Kara [name] is a 65 y.o.female presenting for followup. She was seen in October, missed her last scheduled visit as well as lab work.  She has had over 20 pounds of weight loss, largely fluid, with significantly improved lower extremity edema. Lasix is at 60 mg BID with potassium supplements.  Blood pressure is low, limiting titration of medical therapy in general. She is off Cardizem following her hospitalization, still has occasional palpitations, although no definitive atrial fibrillation recently. She is tolerating beta blocker therapy.  She reports problems with anemia, has pending consultation with Kara [name].  Lab work from September showed potassium 4.1, CO2 44, BUN 32, creatinine 1.4, sodium 138. I reminded her of the importance of obtaining follow up blood work for reassessment, as she continues on relatively high-dose Lasix.  We also discussed her cardiac testing over time, diagnosis of probable diastolic heart failure, and plan for medical therapy going forward.    Allergies  Allergen Reactions  . Codeine Nausea And Vomiting and Rash    Medication list reviewed.  Past Medical History  Diagnosis Date  . Type 2 diabetes mellitus   . Arthritis   . COPD (chronic obstructive pulmonary disease)     Home oxygen  . Palpitations     Reports history of atrial fibrillation, although none seen recently  . Coronary atherosclerosis of native coronary artery     Nonobstructive at catheterization 2008  . GERD (gastroesophageal reflux disease)   . Irritable bowel syndrome   . Chronic anemia   . Hypothyroidism   . Hyperlipidemia   . Chronic edema   . Pneumonia   . Diabetes mellitus   . Hypertension   . Chronic pain     Social History Kara [name] reports that she has never smoked. She has never used smokeless tobacco. Kara [name] reports that she does not drink alcohol.  Review of Systems As outlined above. No cough, fevers, chills. Stable appetite.  Weakness. Otherwise negative.  Physical Examination Filed Vitals:   07/10/11 1448  BP: 89/49  Pulse: 65  Resp: 16    Chronically ill appearing woman wearing oxygen in no acute distress.  HEENT: Conjunctiva and lids normal, oropharynx with moist mucosa.  Neck: Supple, no elevated JVP or carotid bruits.  Lungs: Diminished breath sounds throughout, no wheezing.  Cardiac: Distant regular heart sounds with occasional ectopic beat, no S3 gallop or pericardial rub or knock.  Abdomen: Soft, nontender, bowel sounds present.  Extremities: Mild edema below the knees bilaterally, nonpitting. Skin: Otherwise warm and dry.  Neuropsychiatric: Alert and oriented x3, short-term memory seems impaired.    Problem List and Plan

## 2011-07-10 NOTE — Assessment & Plan Note (Signed)
Plan to continue medical therapy including diuretics and rate control medications. Blood pressure is relatively low, limiting further titration or addition of other agents. I reminded her of the importance of followup lab work for reassessment of renal function and electrolytes. This will be reordered for this week. I also explained that we may need to reduce the dose of her diuretic ultimately. She seemed hesitant to make any changes today. Followup arranged.

## 2011-07-10 NOTE — Assessment & Plan Note (Signed)
Much improved

## 2011-07-10 NOTE — Assessment & Plan Note (Signed)
Previous documented as nonobstructive as of cardiac catheterization in 2008.

## 2011-07-10 NOTE — Patient Instructions (Signed)
Your physician recommends that you schedule a follow-up appointment in: 2 months  Your physician recommends that you return for lab work in: Today    

## 2011-07-28 ENCOUNTER — Ambulatory Visit (HOSPITAL_COMMUNITY): Payer: Medicare Other | Admitting: Oncology

## 2011-08-04 ENCOUNTER — Telehealth (HOSPITAL_COMMUNITY): Payer: Self-pay

## 2011-08-04 ENCOUNTER — Encounter (HOSPITAL_COMMUNITY): Payer: Medicare Other | Attending: Oncology | Admitting: Oncology

## 2011-08-04 ENCOUNTER — Encounter (HOSPITAL_COMMUNITY): Payer: Self-pay | Admitting: Oncology

## 2011-08-04 VITALS — BP 95/45 | HR 59 | Temp 97.2°F | Ht 66.0 in | Wt 160.8 lb

## 2011-08-04 DIAGNOSIS — D638 Anemia in other chronic diseases classified elsewhere: Secondary | ICD-10-CM

## 2011-08-04 DIAGNOSIS — I509 Heart failure, unspecified: Secondary | ICD-10-CM

## 2011-08-04 DIAGNOSIS — J449 Chronic obstructive pulmonary disease, unspecified: Secondary | ICD-10-CM

## 2011-08-04 DIAGNOSIS — D649 Anemia, unspecified: Secondary | ICD-10-CM | POA: Insufficient documentation

## 2011-08-04 LAB — COMPREHENSIVE METABOLIC PANEL
Albumin: 3.6 g/dL (ref 3.5–5.2)
Alkaline Phosphatase: 76 U/L (ref 39–117)
BUN: 27 mg/dL — ABNORMAL HIGH (ref 6–23)
CO2: 40 mEq/L (ref 19–32)
Chloride: 94 mEq/L — ABNORMAL LOW (ref 96–112)
Creatinine, Ser: 1.4 mg/dL — ABNORMAL HIGH (ref 0.50–1.10)
GFR calc Af Amer: 45 mL/min — ABNORMAL LOW (ref >90)
GFR calc non Af Amer: 38 mL/min — ABNORMAL LOW (ref >90)
Glucose, Bld: 94 mg/dL (ref 70–99)
Potassium: 4.2 mEq/L (ref 3.5–5.1)
Total Bilirubin: 0.3 mg/dL (ref 0.3–1.2)

## 2011-08-04 LAB — CBC
HCT: 26.5 % — ABNORMAL LOW (ref 36.0–46.0)
Hemoglobin: 8.2 g/dL — ABNORMAL LOW (ref 12.0–15.0)
MCHC: 30.9 g/dL (ref 30.0–36.0)
RBC: 2.75 MIL/uL — ABNORMAL LOW (ref 3.87–5.11)

## 2011-08-04 LAB — RETICULOCYTES
RBC.: 2.75 MIL/uL — ABNORMAL LOW (ref 3.87–5.11)
Retic Count, Absolute: 44 10*3/uL (ref 19.0–186.0)
Retic Ct Pct: 1.6 % (ref 0.4–3.1)

## 2011-08-04 LAB — IRON AND TIBC
Saturation Ratios: 23 % (ref 20–55)
UIBC: 288 ug/dL (ref 125–400)

## 2011-08-04 LAB — DIFFERENTIAL
Lymphs Abs: 2 10*3/uL (ref 0.7–4.0)
Monocytes Absolute: 0.5 10*3/uL (ref 0.1–1.0)
Monocytes Relative: 9 % (ref 3–12)
Neutro Abs: 3 10*3/uL (ref 1.7–7.7)
Neutrophils Relative %: 53 % (ref 43–77)

## 2011-08-04 NOTE — Progress Notes (Signed)
[facility] NEW PATIENT EVALUATION   Name: Kara [name] Date: 08/04/2011 MRN: [id number] DOB: May 11, 1946    CC: Kara Reichert, MD     DIAGNOSIS: Anemia   HISTORY OF PRESENT ILLNESS:Kara [name] is a 66 y.o. female who is known to the [facility] who was treated and followed for anemia of chronic disease.  She is referred back to the clinic for further evaluation of her anemia.  The patient has a medical history significant for oxygen dependent COPD due to her strong smoking history, DM, diastolic congestive heart failure, GERD, IBS, hypothyroidism, hyperlipidemia, HTN, and CAD.  The patient reports that she was hospitalized in the fall of 2012 for B/L LE edema and cellulitis and when she was evaluated, her Hgb was 8.8.  The patient wishes to discuss her "low blood pressure" and I have declined to do so.  Instead she will write it down on a piece of paper and present it to her PCP.  She reports that she got injections from the [facility] in the past for her anemia.   Unfortunately, we do not have any labs or notes from the referring physician and therefore we will need to repeat lab work.    She admits to SOB and cough secondary to her lung disease.  She denies any fevers, chills, nigh tsweats, headaches, dizziness, double vision, abdominal, Chest pain, heart palpitations, blood in stool, black tarry stool, hematuria, sore tongue, pica, and bleeding.    FAMILY HISTORY: family history includes Hypertension in an unspecified family member.   PAST MEDICAL HISTORY:  has a past medical history of Type 2 diabetes mellitus; Arthritis; COPD (chronic obstructive pulmonary disease); Palpitations; Coronary atherosclerosis of native coronary artery; GERD (gastroesophageal reflux disease); Irritable bowel syndrome; Chronic anemia; Hypothyroidism; Hyperlipidemia; Chronic edema; Pneumonia; Diabetes mellitus; Hypertension; and Chronic pain.       CURRENT  MEDICATIONS: See medication list   SOCIAL HISTORY:  reports that she has never smoked. She has never used smokeless tobacco. She reports that she does not drink alcohol or use illicit drugs.  The patient reports that she smoked 1 1/2 ppd of tobacco for 38 years.     ALLERGIES: Codeine   LABORATORY DATA:  CBC    Component Value Date/Time   WBC 5.8 04/18/2011 0550   RBC 3.14* 04/18/2011 0550   HGB 9.0* 04/18/2011 0550   HCT 28.3* 04/18/2011 0550   PLT 304 04/18/2011 0550   MCV 90.1 04/18/2011 0550   MCH 28.7 04/18/2011 0550   MCHC 31.8 04/18/2011 0550   RDW 13.3 04/18/2011 0550   LYMPHSABS 1.3 04/13/2011 1558   MONOABS 0.9 04/13/2011 1558   EOSABS 0.2 04/13/2011 1558   BASOSABS 0.0 04/13/2011 1558      Chemistry      Component Value Date/Time   NA 138 04/20/2011 0436   K 4.1 04/20/2011 0436   CL 93* 04/20/2011 0436   CO2 44* 04/20/2011 0436   BUN 32* 04/20/2011 0436   CREATININE 1.42* 04/20/2011 0436      Component Value Date/Time   CALCIUM 9.5 04/20/2011 0436   ALKPHOS 42 04/15/2011 0555   AST 18 04/15/2011 0555   ALT 6 04/15/2011 0555   BILITOT 0.4 04/15/2011 0555       REVIEW OF SYSTEMS: Patient reports no health concerns.   PHYSICAL EXAM:  height is 5\' 6"  (1.676 m) and weight is 160 lb 12.8 oz (72.938 kg). Her oral temperature is 97.2 F (36.2 C).  Her blood pressure is 95/45 and her pulse is 59.  General appearance: alert, cooperative, appears older than stated age, no distress and nasal cannula in place delivering O2 Head: Normocephalic, without obvious abnormality, atraumatic Neck: supple, symmetrical, trachea midline Resp: diminished breath sounds diffusely and rales bibasilar Cardio: regular rate and rhythm, S1, S2 normal, no murmur, click, rub or gallop GI: soft, non-tender; bowel sounds normal; no masses,  no organomegaly Extremities: edema 1-2+ pitting edema in LE B/L Neurologic: Grossly normal     IMPRESSION:  1. Anemia 2. O2 dependent COPD 3. Tobacco abuse, 57  pack years, quitting in 1998 4. Anemia of chronic disease.   5. DM 6. Diastolic congestive heart failure 7. GERD 8. IBS 9. Hypothyroidism 10. Hyperlipidemia 11. HTN 12. CAD    PLAN:  1. Lab work today: CBC diff, CMET, Retic Count, Anemia Panel 2. Return in 6 weeks for follow-up. 3. Patient education regarding anemia 4. Chart review of hospital admission 5. Will consider initiation of Procrit pending results of above mentioned lab work.  All questions were answered.  The patient knows to call the clinic with any questions or concerns.   Patient and plan discussed with Dr. [name] and he is in agreement with the aforementioned.  Patient seen by Dr. [name] as well.

## 2011-08-04 NOTE — Patient Instructions (Signed)
Kara [name]  [id number] 14-Jan-1946   [facility]  Discharge Instructions  RECOMMENDATIONS MADE BY THE CONSULTANT AND ANY TEST RESULTS WILL BE SENT TO YOUR REFERRING DOCTOR.   EXAM FINDINGS BY MD TODAY AND SIGNS AND SYMPTOMS TO REPORT TO CLINIC OR PRIMARY MD: Will check some labs today to determine what type of anemia you have.  MEDICATIONS PRESCRIBED: none   INSTRUCTIONS GIVEN AND DISCUSSED: Other :  Report increased shortness of breath, lower extremity swelling, etc.  SPECIAL INSTRUCTIONS/FOLLOW-UP: Return to Clinic in 6 weeks.   I acknowledge that I have been informed and understand all the instructions given to me and received a copy. I do not have any more questions at this time, but understand that I may call the Specialty Clinic at [facility] at [phone] during business hours should I have any further questions or need assistance in obtaining follow-up care.    __________________________________________  _____________  __________ Signature of Patient or Authorized Representative            Date                   Time    __________________________________________ Nurse's Signature

## 2011-08-04 NOTE — Telephone Encounter (Signed)
CRITICAL VALUE ALERT  Critical value received:  CO2 level of 40 Date of notification:  08/06/11 Time of notification: 1746  Critical value read back:  yes  Nurse who received alert:  Tobie Lords, RN MD notified:  Dr. [name]

## 2011-08-05 LAB — VITAMIN B12: Vitamin B-12: 265 pg/mL (ref 211–911)

## 2011-08-05 LAB — FOLATE: Folate: 5 ng/mL

## 2011-08-05 LAB — FERRITIN: Ferritin: 413 ng/mL — ABNORMAL HIGH (ref 10–291)

## 2011-08-07 ENCOUNTER — Telehealth (HOSPITAL_COMMUNITY): Payer: Self-pay | Admitting: *Deleted

## 2011-08-07 ENCOUNTER — Encounter (HOSPITAL_COMMUNITY): Payer: Self-pay | Admitting: Oncology

## 2011-08-07 ENCOUNTER — Other Ambulatory Visit (HOSPITAL_COMMUNITY): Payer: Self-pay | Admitting: Oncology

## 2011-08-07 DIAGNOSIS — N289 Disorder of kidney and ureter, unspecified: Secondary | ICD-10-CM | POA: Insufficient documentation

## 2011-08-07 NOTE — Telephone Encounter (Signed)
Message left for pt to call clinic regarding lab results.

## 2011-08-07 NOTE — Telephone Encounter (Signed)
Message copied by Dennie Maizes on Mon Aug 07, 2011  4:16 PM ------      Message from: Ellouise Newer III      Created: Mon Aug 07, 2011 12:55 PM       Call in Folic Acid 1 mg.  To be taken daily #30 with 0 refills.              Aranesp 500 mcg every 21 days.  Plan built

## 2011-08-09 ENCOUNTER — Telehealth (HOSPITAL_COMMUNITY): Payer: Self-pay | Admitting: *Deleted

## 2011-08-09 NOTE — Telephone Encounter (Signed)
Spoke with pt,states someone has already called her about the folic acid and aranesp injection.

## 2011-08-10 ENCOUNTER — Other Ambulatory Visit: Payer: Self-pay | Admitting: Cardiology

## 2011-08-10 ENCOUNTER — Encounter (HOSPITAL_BASED_OUTPATIENT_CLINIC_OR_DEPARTMENT_OTHER): Payer: Medicare Other

## 2011-08-10 DIAGNOSIS — N289 Disorder of kidney and ureter, unspecified: Secondary | ICD-10-CM

## 2011-08-10 DIAGNOSIS — I2510 Atherosclerotic heart disease of native coronary artery without angina pectoris: Secondary | ICD-10-CM

## 2011-08-10 DIAGNOSIS — E039 Hypothyroidism, unspecified: Secondary | ICD-10-CM

## 2011-08-10 DIAGNOSIS — D649 Anemia, unspecified: Secondary | ICD-10-CM

## 2011-08-10 DIAGNOSIS — I5032 Chronic diastolic (congestive) heart failure: Secondary | ICD-10-CM

## 2011-08-10 DIAGNOSIS — J449 Chronic obstructive pulmonary disease, unspecified: Secondary | ICD-10-CM

## 2011-08-10 DIAGNOSIS — I10 Essential (primary) hypertension: Secondary | ICD-10-CM

## 2011-08-10 MED ORDER — DARBEPOETIN ALFA-POLYSORBATE 500 MCG/ML IJ SOLN
INTRAMUSCULAR | Status: AC
  Administered 2011-08-10: 500 ug via SUBCUTANEOUS
  Filled 2011-08-10: qty 1

## 2011-08-10 MED ORDER — DARBEPOETIN ALFA-POLYSORBATE 500 MCG/ML IJ SOLN
500.0000 ug | Freq: Once | INTRAMUSCULAR | Status: AC
  Administered 2011-08-10: 500 ug via SUBCUTANEOUS

## 2011-08-10 NOTE — Progress Notes (Signed)
Kara [name] presents today for injection per MD orders. Aranesp administered SQ in right Abdomen. Administration without incident. Patient also presented with an order from Dr. Ival Bible office for lab work to be done.  Magnesium level drawn per Dr. Ival Bible order. Patient tolerated well.

## 2011-08-15 ENCOUNTER — Ambulatory Visit (HOSPITAL_COMMUNITY): Payer: Medicare Other

## 2011-08-28 ENCOUNTER — Ambulatory Visit (HOSPITAL_COMMUNITY): Payer: Medicare Other

## 2011-09-01 ENCOUNTER — Encounter (HOSPITAL_COMMUNITY): Payer: Medicare Other | Attending: Oncology

## 2011-09-01 DIAGNOSIS — I2510 Atherosclerotic heart disease of native coronary artery without angina pectoris: Secondary | ICD-10-CM

## 2011-09-01 DIAGNOSIS — I509 Heart failure, unspecified: Secondary | ICD-10-CM | POA: Insufficient documentation

## 2011-09-01 DIAGNOSIS — J449 Chronic obstructive pulmonary disease, unspecified: Secondary | ICD-10-CM | POA: Insufficient documentation

## 2011-09-01 DIAGNOSIS — D649 Anemia, unspecified: Secondary | ICD-10-CM | POA: Insufficient documentation

## 2011-09-01 DIAGNOSIS — J4489 Other specified chronic obstructive pulmonary disease: Secondary | ICD-10-CM | POA: Insufficient documentation

## 2011-09-01 DIAGNOSIS — I10 Essential (primary) hypertension: Secondary | ICD-10-CM | POA: Insufficient documentation

## 2011-09-01 DIAGNOSIS — E039 Hypothyroidism, unspecified: Secondary | ICD-10-CM | POA: Insufficient documentation

## 2011-09-01 DIAGNOSIS — N289 Disorder of kidney and ureter, unspecified: Secondary | ICD-10-CM | POA: Insufficient documentation

## 2011-09-01 DIAGNOSIS — I5032 Chronic diastolic (congestive) heart failure: Secondary | ICD-10-CM | POA: Insufficient documentation

## 2011-09-01 LAB — CBC
MCH: 29.9 pg (ref 26.0–34.0)
MCHC: 31 g/dL (ref 30.0–36.0)
MCV: 96.2 fL (ref 78.0–100.0)
Platelets: 275 10*3/uL (ref 150–400)
RBC: 3.45 MIL/uL — ABNORMAL LOW (ref 3.87–5.11)
RDW: 13.2 % (ref 11.5–15.5)

## 2011-09-01 NOTE — Progress Notes (Signed)
1320 Venipuncture done for cbc. Tolerated well. 1350 pt left before results from labs obtained. Hgb 10.3 Pt to be given return appt for next week for inj.Kara [name] to call pt with appt.

## 2011-09-04 ENCOUNTER — Encounter (HOSPITAL_BASED_OUTPATIENT_CLINIC_OR_DEPARTMENT_OTHER): Payer: Medicare Other

## 2011-09-04 DIAGNOSIS — D649 Anemia, unspecified: Secondary | ICD-10-CM

## 2011-09-04 DIAGNOSIS — D638 Anemia in other chronic diseases classified elsewhere: Secondary | ICD-10-CM

## 2011-09-04 DIAGNOSIS — I5032 Chronic diastolic (congestive) heart failure: Secondary | ICD-10-CM

## 2011-09-04 DIAGNOSIS — I2510 Atherosclerotic heart disease of native coronary artery without angina pectoris: Secondary | ICD-10-CM

## 2011-09-04 DIAGNOSIS — I10 Essential (primary) hypertension: Secondary | ICD-10-CM

## 2011-09-04 DIAGNOSIS — J449 Chronic obstructive pulmonary disease, unspecified: Secondary | ICD-10-CM

## 2011-09-04 DIAGNOSIS — N289 Disorder of kidney and ureter, unspecified: Secondary | ICD-10-CM

## 2011-09-04 DIAGNOSIS — E039 Hypothyroidism, unspecified: Secondary | ICD-10-CM

## 2011-09-04 MED ORDER — DARBEPOETIN ALFA-POLYSORBATE 500 MCG/ML IJ SOLN
INTRAMUSCULAR | Status: AC
  Administered 2011-09-04: 500 ug via SUBCUTANEOUS
  Filled 2011-09-04: qty 1

## 2011-09-04 MED ORDER — DARBEPOETIN ALFA-POLYSORBATE 500 MCG/ML IJ SOLN
500.0000 ug | Freq: Once | INTRAMUSCULAR | Status: AC
  Administered 2011-09-04: 500 ug via SUBCUTANEOUS

## 2011-09-04 NOTE — Progress Notes (Signed)
Kara [name] presents today for injection per MD orders. Aranesp 500 mcg administered SQ in left Abdomen. Administration without incident. Patient tolerated well.  

## 2011-09-11 ENCOUNTER — Encounter (HOSPITAL_BASED_OUTPATIENT_CLINIC_OR_DEPARTMENT_OTHER): Payer: Medicare Other | Admitting: Oncology

## 2011-09-11 VITALS — BP 115/67 | HR 64 | Temp 97.9°F | Ht 66.0 in | Wt 159.3 lb

## 2011-09-11 DIAGNOSIS — D638 Anemia in other chronic diseases classified elsewhere: Secondary | ICD-10-CM

## 2011-09-11 DIAGNOSIS — D649 Anemia, unspecified: Secondary | ICD-10-CM

## 2011-09-11 DIAGNOSIS — J449 Chronic obstructive pulmonary disease, unspecified: Secondary | ICD-10-CM

## 2011-09-11 DIAGNOSIS — N289 Disorder of kidney and ureter, unspecified: Secondary | ICD-10-CM

## 2011-09-11 NOTE — Progress Notes (Signed)
This office note has been dictated.

## 2011-09-11 NOTE — Progress Notes (Signed)
CC:   [name], MD  DIAGNOSES: 1. Anemia of chronic disease. 2. Mild renal insufficiency. 3. Chronic obstructive pulmonary disease and she is oxygen dependent. 4. History of sleep apnea syndrome. 5. History of diastolic congestive heart failure. 6. Diabetes mellitus. 7. Hypothyroidism. 8. Hyperlipidemia. 9. History of hypertension. 10.Coronary artery disease. Kara [name] has already responded to 2 Aranesp injections.  Her hemoglobin is up to 10.3 g, but she cannot tell that she is any better.  Her folic acid was indeterminant at 5.0, so we also replaced that, I should add.  So she does not feel any better and her hemoglobin is 10.3.  We are going to give her 2 more shots and see if she is any better.  She will be here on the 4th of March and the 25th.  Do CBCs then and see where we stand at that time.  Either I or [name] will see her after the 25th injection and the CBC.    ______________________________ [name]. [name], MD ESN/MEDQ  D:  09/11/2011  T:  09/11/2011  Job:  [id number]

## 2011-09-11 NOTE — Patient Instructions (Signed)
Kara [name]  [id number] [birth year]   [facility]  Discharge Instructions  RECOMMENDATIONS MADE BY THE CONSULTANT AND ANY TEST RESULTS WILL BE SENT TO YOUR REFERRING DOCTOR.   EXAM FINDINGS BY MD TODAY AND SIGNS AND SYMPTOMS TO REPORT TO CLINIC OR PRIMARY MD: You are doing well with the aranesp - your hemoglobin is up to 10.3 from 8.2.  We will continue the injections for now.  MEDICATIONS PRESCRIBED: none   INSTRUCTIONS GIVEN AND DISCUSSED: Other :  Report increased shortness of breath, swelling in lower extremities, increased fatigue.  SPECIAL INSTRUCTIONS/FOLLOW-UP: Other (Referral/Appointments) as scheduled.   I acknowledge that I have been informed and understand all the instructions given to me and received a copy. I do not have any more questions at this time, but understand that I may call the Specialty Clinic at [facility] at [phone] during business hours should I have any further questions or need assistance in obtaining follow-up care.    __________________________________________  _____________  __________ Signature of Patient or Authorized Representative            Date                   Time    __________________________________________ Nurse's Signature

## 2011-09-13 ENCOUNTER — Encounter: Payer: Self-pay | Admitting: Cardiology

## 2011-09-13 ENCOUNTER — Ambulatory Visit (INDEPENDENT_AMBULATORY_CARE_PROVIDER_SITE_OTHER): Payer: Medicare Other | Admitting: Cardiology

## 2011-09-13 VITALS — BP 113/51 | HR 60 | Resp 18 | Ht 66.0 in | Wt 160.0 lb

## 2011-09-13 DIAGNOSIS — I2510 Atherosclerotic heart disease of native coronary artery without angina pectoris: Secondary | ICD-10-CM

## 2011-09-13 DIAGNOSIS — I509 Heart failure, unspecified: Secondary | ICD-10-CM

## 2011-09-13 DIAGNOSIS — I5032 Chronic diastolic (congestive) heart failure: Secondary | ICD-10-CM

## 2011-09-13 DIAGNOSIS — N289 Disorder of kidney and ureter, unspecified: Secondary | ICD-10-CM

## 2011-09-13 DIAGNOSIS — I10 Essential (primary) hypertension: Secondary | ICD-10-CM

## 2011-09-13 NOTE — Assessment & Plan Note (Signed)
Blood pressure well-controlled today. 

## 2011-09-13 NOTE — Progress Notes (Signed)
Clinical Summary Kara [name] is a 66 y.o.female presenting for followup. She was seen in December 2012. She states that she has been doing relatively well. She has stable shortness of breath, and much improved edema, still on high-dose diuretic therapy.  Lab work from January showed sodium 140, potassium 4.2, BUN 27, creatinine 1.4, AST 18, ALT 7, magnesium 1.7. Most recent CBC earlier this month was 10.3, followed by hematology.  She reports no palpitations or chest pain. We reviewed her lab work and cardiac regimen.   Allergies  Allergen Reactions  . Codeine Nausea And Vomiting and Rash    Current Outpatient Prescriptions  Medication Sig Dispense Refill  . acetaminophen (TYLENOL) 325 MG tablet Take 650 mg by mouth every 6 (six) hours as needed.        . ALPRAZolam (XANAX) 1 MG tablet Take 1 mg by mouth 3 (three) times daily as needed. For anxiety      . Choline Fenofibrate (TRILIPIX) 135 MG capsule Take 135 mg by mouth daily.        . citalopram (CELEXA) 20 MG tablet Take 20 mg by mouth daily.       [name] dexlansoprazole (DEXILANT) 60 MG capsule Take 60 mg by mouth daily.        . folic acid (FOLVITE) 1 MG tablet Take 1 mg by mouth daily.      . furosemide (LASIX) 20 MG tablet Take 60 mg by mouth 2 (two) times daily.       [name] gabapentin (NEURONTIN) 300 MG capsule Take 300 mg by mouth 4 (four) times daily.       [name] levothyroxine (SYNTHROID, LEVOTHROID) 100 MCG tablet Take 100 mcg by mouth daily.        [name] lubiprostone (AMITIZA) 24 MCG capsule Take 24 mcg by mouth 2 (two) times daily with a meal.      . metFORMIN (GLUCOPHAGE) 1000 MG tablet Take 1,000 mg by mouth 2 (two) times daily with a meal.       . metoprolol tartrate (LOPRESSOR) 25 MG tablet Take 25 mg by mouth 2 (two) times daily.        [name] morphine (MS CONTIN) 30 MG 12 hr tablet Take 30 mg by mouth 3 (three) times daily. To treat pain associated with 3 bulging disc in back      . Nebulizer MISC by Does not apply route as directed.         . nitroGLYCERIN (NITROSTAT) 0.4 MG SL tablet Place 0.4 mg under the tongue every 5 (five) minutes as needed.        . polyethylene glycol (MIRALAX / GLYCOLAX) packet Take 17 g by mouth daily.      . potassium chloride (K-DUR) 10 MEQ tablet Take 20 mEq by mouth 2 (two) times daily.       [name] zolpidem (AMBIEN) 10 MG tablet Take 5 mg by mouth at bedtime as needed.         Past Medical History  Diagnosis Date  . Type 2 diabetes mellitus   . Arthritis   . COPD (chronic obstructive pulmonary disease)     Home oxygen  . Palpitations     Reports history of atrial fibrillation, although none seen recently  . Coronary atherosclerosis of native coronary artery     Nonobstructive at catheterization 2008  . GERD (gastroesophageal reflux disease)   . Irritable bowel syndrome   . Chronic anemia   . Hypothyroidism   . Hyperlipidemia   .  Chronic edema   . Pneumonia   . Diabetes mellitus   . Hypertension   . Chronic pain   . Anemia 08/04/2011  . Renal insufficiency 08/07/2011    Social History Kara [name] reports that she has never smoked. She has never used smokeless tobacco. Kara [name] reports that she does not drink alcohol.  Review of Systems Using supplemental oxygen. No orthopnea or PND. Reports stable appetite. Otherwise negative except as outlined.  Physical Examination Filed Vitals:   09/13/11 1517  BP: 113/51  Pulse: 60  Resp: 18   Chronically ill appearing woman wearing oxygen in no acute distress.  HEENT: Conjunctiva and lids normal, oropharynx with moist mucosa.  Neck: Supple, no elevated JVP or carotid bruits.  Lungs: Diminished breath sounds throughout, no wheezing.  Cardiac: Distant regular heart sounds, no S3 gallop or pericardial rub or knock.  Abdomen: Soft, nontender, bowel sounds present.  Extremities: Trace edema below the knees bilaterally, nonpitting.  Skin: Otherwise warm and dry.  Neuropsychiatric: Alert and oriented x3, short-term memory seems impaired as  before.    Problem List and Plan

## 2011-09-13 NOTE — Patient Instructions (Signed)
Your physician recommends that you schedule a follow-up appointment in:3 months with Dr. Diona Browner.  Your physician recommends that you continue on your current medications as directed. Please refer to the Current Medication list given to you today.   Your physician recommends that you return for lab work :BMET before your next visit in 3 months at [facility].

## 2011-09-13 NOTE — Assessment & Plan Note (Signed)
Stable weight with much improved edema on current diuretic regimen, stable creatinine and potassium. No changes made today. Followup arranged.

## 2011-09-13 NOTE — Assessment & Plan Note (Signed)
Stable, most recent creatinine 1.4. Followup BMET for next visit.

## 2011-09-13 NOTE — Assessment & Plan Note (Signed)
Nonobstructive per prior assessment, no active angina.

## 2011-09-14 ENCOUNTER — Encounter: Payer: Self-pay | Admitting: *Deleted

## 2011-09-15 ENCOUNTER — Ambulatory Visit (HOSPITAL_COMMUNITY): Payer: Medicare Other | Admitting: Oncology

## 2011-09-25 ENCOUNTER — Ambulatory Visit (HOSPITAL_COMMUNITY): Payer: Medicare Other

## 2011-09-28 ENCOUNTER — Other Ambulatory Visit (INDEPENDENT_AMBULATORY_CARE_PROVIDER_SITE_OTHER): Payer: Self-pay | Admitting: *Deleted

## 2011-09-28 NOTE — Telephone Encounter (Signed)
Rec'd PA from [facility] for the Dexilant 60 mg patient to take 1 by mouth daily #30 11 RF. I called the Insurance [employer]apny

## 2011-09-28 NOTE — Telephone Encounter (Signed)
I called the [employer] and per the [employer]  She approved  the Dexilant form 09-07-11-/ 09-27-12. Pam at [facility] was made aware. They will contact the patient.

## 2011-10-02 ENCOUNTER — Telehealth (HOSPITAL_COMMUNITY): Payer: Self-pay

## 2011-10-02 NOTE — Telephone Encounter (Signed)
Patient has been ill and has not been able to come for injection since 2/11. Wants to know if she should be seen prior to being rescheduled for labs and an injection.

## 2011-10-03 NOTE — Telephone Encounter (Signed)
Let's keep her for the 25th of March for office visit and to see me.

## 2011-10-03 NOTE — Telephone Encounter (Signed)
Message left for patient that appointment was cancelled for 3/15 and to keep the appointment scheduled for 3/25 for blood work, possible injection and to see PA.

## 2011-10-06 ENCOUNTER — Ambulatory Visit (HOSPITAL_COMMUNITY): Payer: Medicare Other | Admitting: Oncology

## 2011-10-16 ENCOUNTER — Encounter (HOSPITAL_COMMUNITY): Payer: Medicare Other | Attending: Oncology

## 2011-10-16 ENCOUNTER — Encounter (HOSPITAL_COMMUNITY): Payer: Self-pay | Admitting: Oncology

## 2011-10-16 ENCOUNTER — Encounter (HOSPITAL_BASED_OUTPATIENT_CLINIC_OR_DEPARTMENT_OTHER): Payer: Medicare Other | Admitting: Oncology

## 2011-10-16 VITALS — BP 83/59 | HR 69 | Temp 99.4°F | Wt 163.8 lb

## 2011-10-16 DIAGNOSIS — E039 Hypothyroidism, unspecified: Secondary | ICD-10-CM

## 2011-10-16 DIAGNOSIS — I2510 Atherosclerotic heart disease of native coronary artery without angina pectoris: Secondary | ICD-10-CM

## 2011-10-16 DIAGNOSIS — D638 Anemia in other chronic diseases classified elsewhere: Secondary | ICD-10-CM

## 2011-10-16 DIAGNOSIS — I5032 Chronic diastolic (congestive) heart failure: Secondary | ICD-10-CM | POA: Insufficient documentation

## 2011-10-16 DIAGNOSIS — J449 Chronic obstructive pulmonary disease, unspecified: Secondary | ICD-10-CM

## 2011-10-16 DIAGNOSIS — D649 Anemia, unspecified: Secondary | ICD-10-CM | POA: Insufficient documentation

## 2011-10-16 DIAGNOSIS — I10 Essential (primary) hypertension: Secondary | ICD-10-CM | POA: Insufficient documentation

## 2011-10-16 DIAGNOSIS — N289 Disorder of kidney and ureter, unspecified: Secondary | ICD-10-CM

## 2011-10-16 DIAGNOSIS — J4489 Other specified chronic obstructive pulmonary disease: Secondary | ICD-10-CM | POA: Insufficient documentation

## 2011-10-16 DIAGNOSIS — I509 Heart failure, unspecified: Secondary | ICD-10-CM | POA: Insufficient documentation

## 2011-10-16 LAB — CBC
Hemoglobin: 9.1 g/dL — ABNORMAL LOW (ref 12.0–15.0)
MCH: 29.4 pg (ref 26.0–34.0)
MCHC: 30.5 g/dL (ref 30.0–36.0)
Platelets: 221 10*3/uL (ref 150–400)
RBC: 3.1 MIL/uL — ABNORMAL LOW (ref 3.87–5.11)

## 2011-10-16 MED ORDER — DARBEPOETIN ALFA-POLYSORBATE 500 MCG/ML IJ SOLN
500.0000 ug | Freq: Once | INTRAMUSCULAR | Status: AC
  Administered 2011-10-16: 500 ug via SUBCUTANEOUS

## 2011-10-16 MED ORDER — DARBEPOETIN ALFA-POLYSORBATE 500 MCG/ML IJ SOLN
INTRAMUSCULAR | Status: AC
  Filled 2011-10-16: qty 1

## 2011-10-16 NOTE — Progress Notes (Signed)
[name], MD, MD [address] [place] [address]  1. Anemia   2. Renal insufficiency     CURRENT THERAPY: Aranesp 500 mcg every 21 days.    INTERVAL HISTORY: SACHI BOULAY 66 y.o. female returns for  regular  visit for followup of anemia of chronic disease.  The patient reports that she continues to have her arthritic discomfort.  She is not interested in evaluation for knee replacement.  She is fearful of the procedure.  She reports that her knee pain is the worst.  She also has chronic back discomfort.  I explained to the patient that she should follow-up with her primary care physician regarding these issues.    Her breathing is no better or worse.  She continues to utilize her oxygen by nasal cannula.  The patient is tolerating her Aranesp injections well.  She is not the most compliant patient and does not always follow-up with her injections.   ROS: No TIA's or unusual headaches, no dysphagia.  No prolonged cough. No dyspnea or chest pain on exertion.  No abdominal pain, change in bowel habits, black or bloody stools.  No urinary tract symptoms.  No new or unusual musculoskeletal symptoms.     Past Medical History  Diagnosis Date  . Type 2 diabetes mellitus   . Arthritis   . COPD (chronic obstructive pulmonary disease)     Home oxygen  . Palpitations     Reports history of atrial fibrillation, although none seen recently  . Coronary atherosclerosis of native coronary artery     Nonobstructive at catheterization 2008  . GERD (gastroesophageal reflux disease)   . Irritable bowel syndrome   . Chronic anemia   . Hypothyroidism   . Hyperlipidemia   . Chronic edema   . Pneumonia   . Diabetes mellitus   . Hypertension   . Chronic pain   . Anemia 08/04/2011  . Renal insufficiency 08/07/2011    has Lower extremity edema; COPD (chronic obstructive pulmonary disease); Essential hypertension, benign; Coronary atherosclerosis of native coronary artery;  Hypothyroidism; Palpitations; Chronic diastolic heart failure; Anemia of chronic disease; and Renal insufficiency on her problem list.     is allergic to codeine.  Ms. Gwynne does not currently have medications on file.  Past Surgical History  Procedure Date  . Cholecystectomy     Denies any headaches, dizziness, double vision, fevers, chills, night sweats, nausea, vomiting, diarrhea, constipation, chest pain, heart palpitations, shortness of breath, blood in stool, black tarry stool, urinary pain, urinary burning, urinary frequency, hematuria.   PHYSICAL EXAMINATION  ECOG PERFORMANCE STATUS: 2 - Symptomatic, <50% confined to bed  Filed Vitals:   10/16/11 1333  BP: 83/59  Pulse: 69  Temp: 99.4 F (37.4 C)    GENERAL:alert, no distress, well nourished, well developed, comfortable, cooperative and smiling, nasal cannula administering oxygen. SKIN: skin color, texture, turgor are normal, no rashes or significant lesions HEAD: Normocephalic, No masses, lesions, tenderness or abnormalities EYES: normal, EOMI, Conjunctiva are pink and non-injected EARS: External ears normal OROPHARYNX:lips, buccal mucosa, and tongue normal and mucous membranes are moist  NECK: supple, trachea midline LYMPH:  no palpable lymphadenopathy BREAST:not examined LUNGS: clear to auscultation and percussion, decreased breath sounds HEART: regular rate & rhythm, no murmurs, no gallops, S1 normal and S2 normal ABDOMEN:abdomen soft, non-tender and normal bowel sounds BACK: Back symmetric, no curvature., No CVA tenderness EXTREMITIES:less then 2 second capillary refill, no joint deformities, effusion, or inflammation, no skin discoloration, no clubbing,  no cyanosis, positive findings:  edema 1+ LE pitting edema B/L  NEURO: alert & oriented x 3 with fluent speech, no focal motor/sensory deficits, gait normal    LABORATORY DATA: CBC    Component Value Date/Time   WBC 6.3 10/16/2011 1320   RBC 3.10*  10/16/2011 1320   HGB 9.1* 10/16/2011 1320   HCT 29.8* 10/16/2011 1320   PLT 221 10/16/2011 1320   MCV 96.1 10/16/2011 1320   MCH 29.4 10/16/2011 1320   MCHC 30.5 10/16/2011 1320   RDW 13.5 10/16/2011 1320   LYMPHSABS 2.0 08/04/2011 1503   MONOABS 0.5 08/04/2011 1503   EOSABS 0.1 08/04/2011 1503   BASOSABS 0.0 08/04/2011 1503       ASSESSMENT:  1. Anemia of chronic disease.  2. Mild renal insufficiency.  3. Chronic obstructive pulmonary disease and she is oxygen dependent.  4. History of sleep apnea syndrome.  5. History of diastolic congestive heart failure.  6. Diabetes mellitus.  7. Hypothyroidism.  8. Hyperlipidemia.  9. History of hypertension.  10.Coronary artery disease.   PLAN:  1. I personally reviewed and went over laboratory results with the patient. 2. Aranesp 500 mcg today. 3. Aranesp 500 mcg every 21 days.  Will get CBC day of injection. 4. Return in 6 weeks for follow-up.  Will start to evaluate effectiveness of this therapy.  We will administer 4 injections on schedule and if ineffective, will discontinue Aranesp at that time.   All questions were answered. The patient knows to call the clinic with any problems, questions or concerns. We can certainly see the patient much sooner if necessary.  The patient and plan discussed with Glenford Peers, MD and he is in agreement with the aforementioned.  Hanan Mcwilliams

## 2011-10-16 NOTE — Progress Notes (Signed)
Kara [name] presented for labwork. Labs per MD order drawn via Peripheral Line 23 gauge needle inserted in left AC  Good blood return present. Procedure without incident.  Needle removed intact. Patient tolerated procedure well.  Kara [name] presents today for injection per MD orders. Aranesp 500 mcg administered SQ in left Abdomen. Administration without incident. Patient tolerated well.

## 2011-10-16 NOTE — Patient Instructions (Signed)
SERGIO HOBART  [id number] 08/19/1945   [facility]  Discharge Instructions  RECOMMENDATIONS MADE BY THE CONSULTANT AND ANY TEST RESULTS WILL BE SENT TO YOUR REFERRING DOCTOR.   EXAM FINDINGS BY MD TODAY AND SIGNS AND SYMPTOMS TO REPORT TO CLINIC OR PRIMARY MD: We will give you your aranesp injection today and will repeat labs and your aranesp every 3 weeks for 2 more times then re-evaluate.  MEDICATIONS PRESCRIBED: none   INSTRUCTIONS GIVEN AND DISCUSSED: Other :  Report increased fatigue and shortness of breath.  SPECIAL INSTRUCTIONS/FOLLOW-UP: Lab work Needed every 3 weeks and Return to Clinic to see PA in 6 weeks.   I acknowledge that I have been informed and understand all the instructions given to me and received a copy. I do not have any more questions at this time, but understand that I may call the Specialty Clinic at [facility] at [phone] during business hours should I have any further questions or need assistance in obtaining follow-up care.    __________________________________________  _____________  __________ Signature of Patient or Authorized Representative            Date                   Time    __________________________________________ Nurse's Signature

## 2011-10-18 ENCOUNTER — Ambulatory Visit (HOSPITAL_COMMUNITY): Payer: Medicare Other | Admitting: Oncology

## 2011-11-06 ENCOUNTER — Encounter (HOSPITAL_COMMUNITY): Payer: Medicare Other | Attending: Oncology

## 2011-11-06 ENCOUNTER — Other Ambulatory Visit (HOSPITAL_COMMUNITY): Payer: Medicare Other

## 2011-11-06 ENCOUNTER — Encounter (HOSPITAL_BASED_OUTPATIENT_CLINIC_OR_DEPARTMENT_OTHER): Payer: Medicare Other

## 2011-11-06 ENCOUNTER — Ambulatory Visit (HOSPITAL_COMMUNITY): Payer: Medicare Other

## 2011-11-06 VITALS — BP 118/55 | HR 60

## 2011-11-06 DIAGNOSIS — I509 Heart failure, unspecified: Secondary | ICD-10-CM | POA: Insufficient documentation

## 2011-11-06 DIAGNOSIS — I10 Essential (primary) hypertension: Secondary | ICD-10-CM | POA: Insufficient documentation

## 2011-11-06 DIAGNOSIS — I5032 Chronic diastolic (congestive) heart failure: Secondary | ICD-10-CM

## 2011-11-06 DIAGNOSIS — N289 Disorder of kidney and ureter, unspecified: Secondary | ICD-10-CM

## 2011-11-06 DIAGNOSIS — E039 Hypothyroidism, unspecified: Secondary | ICD-10-CM | POA: Insufficient documentation

## 2011-11-06 DIAGNOSIS — J4489 Other specified chronic obstructive pulmonary disease: Secondary | ICD-10-CM | POA: Insufficient documentation

## 2011-11-06 DIAGNOSIS — I2510 Atherosclerotic heart disease of native coronary artery without angina pectoris: Secondary | ICD-10-CM | POA: Insufficient documentation

## 2011-11-06 DIAGNOSIS — D649 Anemia, unspecified: Secondary | ICD-10-CM

## 2011-11-06 DIAGNOSIS — J449 Chronic obstructive pulmonary disease, unspecified: Secondary | ICD-10-CM

## 2011-11-06 LAB — CBC
MCH: 29 pg (ref 26.0–34.0)
MCV: 96 fL (ref 78.0–100.0)
Platelets: 279 10*3/uL (ref 150–400)
RDW: 14.1 % (ref 11.5–15.5)

## 2011-11-06 MED ORDER — DARBEPOETIN ALFA-POLYSORBATE 500 MCG/ML IJ SOLN
INTRAMUSCULAR | Status: AC
  Filled 2011-11-06: qty 1

## 2011-11-06 MED ORDER — DARBEPOETIN ALFA-POLYSORBATE 500 MCG/ML IJ SOLN
500.0000 ug | Freq: Once | INTRAMUSCULAR | Status: AC
  Administered 2011-11-06: 500 ug via SUBCUTANEOUS

## 2011-11-06 NOTE — Progress Notes (Signed)
Kara [name] presented for labwork. Labs per MD order drawn via Peripheral Line 23 gauge needle inserted in left antecubital.  Good blood return present. Procedure without incident.  Needle removed intact. Patient tolerated procedure well.  Kara [name] presents today for injection per MD orders. Aranesp 500 mcg administered SQ in right Abdomen. Administration without incident. Patient tolerated well.

## 2011-11-27 ENCOUNTER — Encounter (HOSPITAL_COMMUNITY): Payer: Medicare Other | Attending: Oncology

## 2011-11-27 ENCOUNTER — Encounter (HOSPITAL_COMMUNITY): Payer: Medicare Other

## 2011-11-27 DIAGNOSIS — E538 Deficiency of other specified B group vitamins: Secondary | ICD-10-CM | POA: Insufficient documentation

## 2011-11-27 DIAGNOSIS — K219 Gastro-esophageal reflux disease without esophagitis: Secondary | ICD-10-CM | POA: Insufficient documentation

## 2011-11-27 DIAGNOSIS — E119 Type 2 diabetes mellitus without complications: Secondary | ICD-10-CM | POA: Insufficient documentation

## 2011-11-27 DIAGNOSIS — IMO0002 Reserved for concepts with insufficient information to code with codable children: Secondary | ICD-10-CM | POA: Insufficient documentation

## 2011-11-27 DIAGNOSIS — N289 Disorder of kidney and ureter, unspecified: Secondary | ICD-10-CM | POA: Insufficient documentation

## 2011-11-27 DIAGNOSIS — E785 Hyperlipidemia, unspecified: Secondary | ICD-10-CM | POA: Insufficient documentation

## 2011-11-27 DIAGNOSIS — I5032 Chronic diastolic (congestive) heart failure: Secondary | ICD-10-CM | POA: Insufficient documentation

## 2011-11-27 DIAGNOSIS — D638 Anemia in other chronic diseases classified elsewhere: Secondary | ICD-10-CM | POA: Insufficient documentation

## 2011-11-27 DIAGNOSIS — F319 Bipolar disorder, unspecified: Secondary | ICD-10-CM | POA: Insufficient documentation

## 2011-11-27 DIAGNOSIS — M479 Spondylosis, unspecified: Secondary | ICD-10-CM | POA: Insufficient documentation

## 2011-11-27 DIAGNOSIS — E039 Hypothyroidism, unspecified: Secondary | ICD-10-CM | POA: Insufficient documentation

## 2011-11-27 DIAGNOSIS — Z9981 Dependence on supplemental oxygen: Secondary | ICD-10-CM | POA: Insufficient documentation

## 2011-11-27 DIAGNOSIS — I2510 Atherosclerotic heart disease of native coronary artery without angina pectoris: Secondary | ICD-10-CM | POA: Insufficient documentation

## 2011-11-27 DIAGNOSIS — Z86718 Personal history of other venous thrombosis and embolism: Secondary | ICD-10-CM | POA: Insufficient documentation

## 2011-11-27 DIAGNOSIS — I10 Essential (primary) hypertension: Secondary | ICD-10-CM | POA: Insufficient documentation

## 2011-11-28 ENCOUNTER — Encounter (HOSPITAL_COMMUNITY): Payer: Medicare Other | Admitting: Oncology

## 2011-11-29 ENCOUNTER — Encounter (HOSPITAL_BASED_OUTPATIENT_CLINIC_OR_DEPARTMENT_OTHER): Payer: Medicare Other

## 2011-11-29 DIAGNOSIS — N289 Disorder of kidney and ureter, unspecified: Secondary | ICD-10-CM

## 2011-11-29 DIAGNOSIS — I2510 Atherosclerotic heart disease of native coronary artery without angina pectoris: Secondary | ICD-10-CM

## 2011-11-29 DIAGNOSIS — I5032 Chronic diastolic (congestive) heart failure: Secondary | ICD-10-CM

## 2011-11-29 DIAGNOSIS — J449 Chronic obstructive pulmonary disease, unspecified: Secondary | ICD-10-CM

## 2011-11-29 DIAGNOSIS — D649 Anemia, unspecified: Secondary | ICD-10-CM

## 2011-11-29 DIAGNOSIS — I10 Essential (primary) hypertension: Secondary | ICD-10-CM

## 2011-11-29 DIAGNOSIS — D638 Anemia in other chronic diseases classified elsewhere: Secondary | ICD-10-CM

## 2011-11-29 DIAGNOSIS — E039 Hypothyroidism, unspecified: Secondary | ICD-10-CM

## 2011-11-29 LAB — CBC
MCV: 96.7 fL (ref 78.0–100.0)
Platelets: 276 10*3/uL (ref 150–400)
RBC: 3.03 MIL/uL — ABNORMAL LOW (ref 3.87–5.11)
WBC: 5.2 10*3/uL (ref 4.0–10.5)

## 2011-11-29 MED ORDER — DARBEPOETIN ALFA-POLYSORBATE 500 MCG/ML IJ SOLN
INTRAMUSCULAR | Status: AC
  Filled 2011-11-29: qty 1

## 2011-11-29 MED ORDER — DARBEPOETIN ALFA-POLYSORBATE 500 MCG/ML IJ SOLN
500.0000 ug | Freq: Once | INTRAMUSCULAR | Status: AC
  Administered 2011-11-29: 500 ug via SUBCUTANEOUS

## 2011-11-29 NOTE — Progress Notes (Signed)
Kara [name] presents today for injection per MD orders. Aranesp 500 mcg administered SQ in right Abdomen. Administration without incident. Patient tolerated well.  

## 2011-12-01 ENCOUNTER — Encounter (HOSPITAL_BASED_OUTPATIENT_CLINIC_OR_DEPARTMENT_OTHER): Payer: Medicare Other | Admitting: Oncology

## 2011-12-01 DIAGNOSIS — D638 Anemia in other chronic diseases classified elsewhere: Secondary | ICD-10-CM

## 2011-12-01 NOTE — Progress Notes (Signed)
This office note has been dictated.

## 2011-12-01 NOTE — Patient Instructions (Signed)
[facility]  Discharge Instructions  RECOMMENDATIONS MADE BY THE CONSULTANT AND ANY TEST RESULTS WILL BE SENT TO YOUR REFERRING DOCTOR.   You need to purchase over-the-counter Ferrous Sulfate tablet and take one daily.  You need to be sure to take your Folic Acid everyday.  Continue to come every 3 weeks for the Aranesp injections. We will give you 4 more injections and then you will see the doctor again.  MD appointment in 12 weeks.   I acknowledge that I have been informed and understand all the instructions given to me and received a copy. I do not have any more questions at this time, but understand that I may call the Specialty Clinic at [facility] at [phone] during business hours should I have any further questions or need assistance in obtaining follow-up care.    __________________________________________  _____________  __________ Signature of Patient or Authorized Representative            Date                   Time    __________________________________________ Nurse's Signature

## 2011-12-01 NOTE — Progress Notes (Signed)
DIAGNOSES: 1. Anemia of chronic disease. 2. Chronic obstructive pulmonary disease on oxygen around the clock. 3. Diabetes mellitus. 4. History of sleep apnea. 5. History of diastolic congestive heart failure. 6. Hypothyroidism. 7. Hyperlipidemia. 8. History of coronary artery disease. 9. History of renal insufficiency. 10.Folic acid deficiency.  Kara [name] once again has symptomatic anemia of chronic disease.  It has been some time since she was on the erythropoietin like therapy.  She has had 5 shots.  Unfortunately missed 2 doses on the schedule, 1 was in early March.  Her shots were about 5 weeks apart there instead of 3 and then she missed 1 there other day so we gave it to her on Wednesday.  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  She also had a left saliva gland resection in 1976 for stones.  She started out showing some responsiveness to the Aranesp with the first shot.  We also found that she had folic acid deficiency and she swears that she is taking the folic acid 1 mg a day that we prescribed.  What I am going to do is give her 4 more shots on a regular basis since she promises to come back every 3 weeks.  If her hemoglobin has not improved by then I think we are going to have to retest her either with bone marrow biopsy or just labs for B12, folic acid, iron, etc. but I have asked her to take 1 ferrous sulfate pill a day to see if she is in need of free iron.  She was given IV iron last September by Dr. Renard Matter. That did not help and I am wondering if she has plenty of storage iron but it will not be released for making new blood cells.  So we will see what transpires.  I will  see her in about 12 weeks.  Her doctor is Dr. Butch Penny.    ______________________________ Kara [name]. [name], MD ESN/MEDQ  D:  12/01/2011  T:  12/01/2011  Job:  [id number]

## 2011-12-04 ENCOUNTER — Encounter (INDEPENDENT_AMBULATORY_CARE_PROVIDER_SITE_OTHER): Payer: Self-pay | Admitting: *Deleted

## 2011-12-15 ENCOUNTER — Ambulatory Visit: Payer: Medicare Other | Admitting: Cardiology

## 2011-12-20 ENCOUNTER — Encounter (HOSPITAL_BASED_OUTPATIENT_CLINIC_OR_DEPARTMENT_OTHER): Payer: Medicare Other

## 2011-12-20 DIAGNOSIS — N289 Disorder of kidney and ureter, unspecified: Secondary | ICD-10-CM

## 2011-12-20 DIAGNOSIS — J449 Chronic obstructive pulmonary disease, unspecified: Secondary | ICD-10-CM

## 2011-12-20 DIAGNOSIS — D649 Anemia, unspecified: Secondary | ICD-10-CM

## 2011-12-20 DIAGNOSIS — I2510 Atherosclerotic heart disease of native coronary artery without angina pectoris: Secondary | ICD-10-CM

## 2011-12-20 DIAGNOSIS — I5032 Chronic diastolic (congestive) heart failure: Secondary | ICD-10-CM

## 2011-12-20 DIAGNOSIS — I10 Essential (primary) hypertension: Secondary | ICD-10-CM

## 2011-12-20 DIAGNOSIS — E039 Hypothyroidism, unspecified: Secondary | ICD-10-CM

## 2011-12-20 LAB — CBC
HCT: 30.9 % — ABNORMAL LOW (ref 36.0–46.0)
Hemoglobin: 9.3 g/dL — ABNORMAL LOW (ref 12.0–15.0)
MCHC: 30.1 g/dL (ref 30.0–36.0)
RBC: 3.27 MIL/uL — ABNORMAL LOW (ref 3.87–5.11)

## 2011-12-20 MED ORDER — DARBEPOETIN ALFA-POLYSORBATE 500 MCG/ML IJ SOLN
INTRAMUSCULAR | Status: AC
  Filled 2011-12-20: qty 1

## 2011-12-20 MED ORDER — DARBEPOETIN ALFA-POLYSORBATE 500 MCG/ML IJ SOLN
500.0000 ug | Freq: Once | INTRAMUSCULAR | Status: AC
  Administered 2011-12-20: 500 ug via SUBCUTANEOUS

## 2011-12-20 NOTE — Progress Notes (Signed)
Tolerated venipuncture and injection well. 

## 2011-12-21 NOTE — Telephone Encounter (Signed)
This encounter was created in error - please disregard.

## 2012-01-08 ENCOUNTER — Ambulatory Visit (HOSPITAL_COMMUNITY)
Admission: RE | Admit: 2012-01-08 | Discharge: 2012-01-08 | Disposition: A | Discharge status: Home / Self Care | Payer: Medicare Other | Source: Ambulatory Visit | Attending: Family Medicine | Admitting: Family Medicine

## 2012-01-08 ENCOUNTER — Other Ambulatory Visit (HOSPITAL_COMMUNITY): Payer: Self-pay | Admitting: Family Medicine

## 2012-01-08 DIAGNOSIS — R0600 Dyspnea, unspecified: Secondary | ICD-10-CM

## 2012-01-08 DIAGNOSIS — R0609 Other forms of dyspnea: Secondary | ICD-10-CM | POA: Insufficient documentation

## 2012-01-08 DIAGNOSIS — M949 Disorder of cartilage, unspecified: Secondary | ICD-10-CM | POA: Insufficient documentation

## 2012-01-08 DIAGNOSIS — M899 Disorder of bone, unspecified: Secondary | ICD-10-CM | POA: Insufficient documentation

## 2012-01-08 DIAGNOSIS — R0989 Other specified symptoms and signs involving the circulatory and respiratory systems: Secondary | ICD-10-CM | POA: Insufficient documentation

## 2012-01-10 ENCOUNTER — Encounter (HOSPITAL_COMMUNITY): Payer: Medicare Other

## 2012-01-10 ENCOUNTER — Encounter (HOSPITAL_BASED_OUTPATIENT_CLINIC_OR_DEPARTMENT_OTHER): Payer: Medicare Other

## 2012-01-10 ENCOUNTER — Encounter (HOSPITAL_COMMUNITY): Payer: Medicare Other | Attending: Oncology

## 2012-01-10 DIAGNOSIS — I2510 Atherosclerotic heart disease of native coronary artery without angina pectoris: Secondary | ICD-10-CM

## 2012-01-10 DIAGNOSIS — N289 Disorder of kidney and ureter, unspecified: Secondary | ICD-10-CM | POA: Insufficient documentation

## 2012-01-10 DIAGNOSIS — I5032 Chronic diastolic (congestive) heart failure: Secondary | ICD-10-CM

## 2012-01-10 DIAGNOSIS — E039 Hypothyroidism, unspecified: Secondary | ICD-10-CM

## 2012-01-10 DIAGNOSIS — D649 Anemia, unspecified: Secondary | ICD-10-CM | POA: Insufficient documentation

## 2012-01-10 DIAGNOSIS — J4489 Other specified chronic obstructive pulmonary disease: Secondary | ICD-10-CM | POA: Insufficient documentation

## 2012-01-10 DIAGNOSIS — I10 Essential (primary) hypertension: Secondary | ICD-10-CM

## 2012-01-10 DIAGNOSIS — J449 Chronic obstructive pulmonary disease, unspecified: Secondary | ICD-10-CM | POA: Insufficient documentation

## 2012-01-10 LAB — CBC
Hemoglobin: 8.6 g/dL — ABNORMAL LOW (ref 12.0–15.0)
MCHC: 30.5 g/dL (ref 30.0–36.0)
RBC: 3.05 MIL/uL — ABNORMAL LOW (ref 3.87–5.11)
WBC: 9 10*3/uL (ref 4.0–10.5)

## 2012-01-10 MED ORDER — DARBEPOETIN ALFA-POLYSORBATE 500 MCG/ML IJ SOLN
INTRAMUSCULAR | Status: AC
  Filled 2012-01-10: qty 1

## 2012-01-10 MED ORDER — DARBEPOETIN ALFA-POLYSORBATE 500 MCG/ML IJ SOLN
500.0000 ug | Freq: Once | INTRAMUSCULAR | Status: AC
  Administered 2012-01-10: 500 ug via SUBCUTANEOUS

## 2012-01-10 NOTE — Progress Notes (Signed)
Kara [name] presents today for labs and injection per the provider's orders.  Labs completed.  Aranesp administered administration without incident; see MAR for injection details.  Patient tolerated procedure well and without incident.  No questions or complaints noted at this time.

## 2012-01-10 NOTE — Progress Notes (Signed)
Labs drawn today for cbc 

## 2012-01-15 ENCOUNTER — Telehealth (INDEPENDENT_AMBULATORY_CARE_PROVIDER_SITE_OTHER): Payer: Self-pay | Admitting: *Deleted

## 2012-01-15 DIAGNOSIS — D649 Anemia, unspecified: Secondary | ICD-10-CM

## 2012-01-15 NOTE — Telephone Encounter (Signed)
[facility] has requested a refill on Polyethylene Glycol POW, Dissolve 17 grams in 8 ounces of water 2 times a day.

## 2012-01-16 NOTE — Telephone Encounter (Signed)
This has been addressed.

## 2012-01-29 ENCOUNTER — Telehealth (HOSPITAL_COMMUNITY): Payer: Self-pay | Admitting: Oncology

## 2012-01-31 ENCOUNTER — Encounter (HOSPITAL_COMMUNITY): Payer: Medicare Other | Attending: Oncology

## 2012-01-31 ENCOUNTER — Encounter (HOSPITAL_COMMUNITY): Payer: Medicare Other

## 2012-01-31 VITALS — BP 101/61 | HR 70

## 2012-01-31 DIAGNOSIS — N289 Disorder of kidney and ureter, unspecified: Secondary | ICD-10-CM

## 2012-01-31 DIAGNOSIS — I5032 Chronic diastolic (congestive) heart failure: Secondary | ICD-10-CM

## 2012-01-31 DIAGNOSIS — I10 Essential (primary) hypertension: Secondary | ICD-10-CM

## 2012-01-31 DIAGNOSIS — D649 Anemia, unspecified: Secondary | ICD-10-CM

## 2012-01-31 DIAGNOSIS — I2510 Atherosclerotic heart disease of native coronary artery without angina pectoris: Secondary | ICD-10-CM

## 2012-01-31 DIAGNOSIS — E039 Hypothyroidism, unspecified: Secondary | ICD-10-CM

## 2012-01-31 DIAGNOSIS — J449 Chronic obstructive pulmonary disease, unspecified: Secondary | ICD-10-CM

## 2012-01-31 DIAGNOSIS — J4489 Other specified chronic obstructive pulmonary disease: Secondary | ICD-10-CM | POA: Insufficient documentation

## 2012-01-31 LAB — CBC
HCT: 30.4 % — ABNORMAL LOW (ref 36.0–46.0)
Hemoglobin: 9 g/dL — ABNORMAL LOW (ref 12.0–15.0)
MCV: 94.7 fL (ref 78.0–100.0)
Platelets: 196 10*3/uL (ref 150–400)
RBC: 3.21 MIL/uL — ABNORMAL LOW (ref 3.87–5.11)
WBC: 5.7 10*3/uL (ref 4.0–10.5)

## 2012-01-31 MED ORDER — DARBEPOETIN ALFA-POLYSORBATE 500 MCG/ML IJ SOLN
500.0000 ug | Freq: Once | INTRAMUSCULAR | Status: AC
  Administered 2012-01-31: 500 ug via SUBCUTANEOUS

## 2012-01-31 MED ORDER — DARBEPOETIN ALFA-POLYSORBATE 500 MCG/ML IJ SOLN
INTRAMUSCULAR | Status: AC
  Filled 2012-01-31: qty 1

## 2012-01-31 NOTE — Progress Notes (Signed)
Kara [name] presents today for injection per MD orders. Aranesp 500 mcg administered SQ in left Abdomen. Administration without incident. Patient tolerated well.

## 2012-02-21 ENCOUNTER — Encounter (HOSPITAL_COMMUNITY): Payer: Medicare Other

## 2012-02-21 ENCOUNTER — Telehealth (HOSPITAL_COMMUNITY): Payer: Self-pay

## 2012-02-21 ENCOUNTER — Encounter (HOSPITAL_BASED_OUTPATIENT_CLINIC_OR_DEPARTMENT_OTHER): Payer: Medicare Other | Admitting: Oncology

## 2012-02-21 VITALS — Wt 166.8 lb

## 2012-02-21 DIAGNOSIS — I10 Essential (primary) hypertension: Secondary | ICD-10-CM

## 2012-02-21 DIAGNOSIS — E039 Hypothyroidism, unspecified: Secondary | ICD-10-CM

## 2012-02-21 DIAGNOSIS — E119 Type 2 diabetes mellitus without complications: Secondary | ICD-10-CM

## 2012-02-21 DIAGNOSIS — D638 Anemia in other chronic diseases classified elsewhere: Secondary | ICD-10-CM

## 2012-02-21 DIAGNOSIS — I5032 Chronic diastolic (congestive) heart failure: Secondary | ICD-10-CM

## 2012-02-21 DIAGNOSIS — J449 Chronic obstructive pulmonary disease, unspecified: Secondary | ICD-10-CM

## 2012-02-21 DIAGNOSIS — N289 Disorder of kidney and ureter, unspecified: Secondary | ICD-10-CM

## 2012-02-21 DIAGNOSIS — I2510 Atherosclerotic heart disease of native coronary artery without angina pectoris: Secondary | ICD-10-CM

## 2012-02-21 DIAGNOSIS — D649 Anemia, unspecified: Secondary | ICD-10-CM

## 2012-02-21 LAB — COMPREHENSIVE METABOLIC PANEL
Alkaline Phosphatase: 74 U/L (ref 39–117)
BUN: 23 mg/dL (ref 6–23)
Calcium: 9.7 mg/dL (ref 8.4–10.5)
Creatinine, Ser: 1.22 mg/dL — ABNORMAL HIGH (ref 0.50–1.10)
GFR calc Af Amer: 53 mL/min — ABNORMAL LOW (ref >90)
Glucose, Bld: 103 mg/dL — ABNORMAL HIGH (ref 70–99)
Potassium: 3.8 mEq/L (ref 3.5–5.1)
Total Protein: 7.2 g/dL (ref 6.0–8.3)

## 2012-02-21 LAB — CBC
HCT: 30.2 % — ABNORMAL LOW (ref 36.0–46.0)
Hemoglobin: 9 g/dL — ABNORMAL LOW (ref 12.0–15.0)
MCV: 95.9 fL (ref 78.0–100.0)
RDW: 15.6 % — ABNORMAL HIGH (ref 11.5–15.5)
WBC: 4.9 10*3/uL (ref 4.0–10.5)

## 2012-02-21 MED ORDER — DARBEPOETIN ALFA-POLYSORBATE 500 MCG/ML IJ SOLN
INTRAMUSCULAR | Status: AC
  Filled 2012-02-21: qty 1

## 2012-02-21 MED ORDER — DARBEPOETIN ALFA-POLYSORBATE 500 MCG/ML IJ SOLN
500.0000 ug | Freq: Once | INTRAMUSCULAR | Status: AC
  Administered 2012-02-21: 500 ug via SUBCUTANEOUS

## 2012-02-21 NOTE — Patient Instructions (Addendum)
Kara [name]  [id number] Sep 01, 1945 Dr. Glenford Peers   [facility]  Discharge Instructions  RECOMMENDATIONS MADE BY THE CONSULTANT AND ANY TEST RESULTS WILL BE SENT TO YOUR REFERRING DOCTOR.   EXAM FINDINGS BY MD TODAY AND SIGNS AND SYMPTOMS TO REPORT TO CLINIC OR PRIMARY MD: Will check some additional labs today to see if your iron or Vitamin B12 or Folic Acid is low.  If they are all normal we will stop the aranesp injections.  Your kidney function has decreased which can also contribute to the anemia that you have.  Will do Aranesp injection today.  MEDICATIONS PRESCRIBED: none   INSTRUCTIONS GIVEN AND DISCUSSED: Other : Report increased shortness of breath, increased fatigue, swelling in your lower extremities, etc.  SPECIAL INSTRUCTIONS/FOLLOW-UP: Lab work Needed today and Return to Clinic in 12 weeks to see MD.   I acknowledge that I have been informed and understand all the instructions given to me and received a copy. I do not have any more questions at this time, but understand that I may call the Specialty Clinic at [facility] at [phone] during business hours should I have any further questions or need assistance in obtaining follow-up care.    __________________________________________  _____________  __________ Signature of Patient or Authorized Representative            Date                   Time    __________________________________________ Nurse's Signature

## 2012-02-21 NOTE — Progress Notes (Signed)
Problem #1 anemia of chronic disease Problem #2 COPD on oxygen 3 L per minute around-the-clock Problem #3 diabetes mellitus Problem #4 diastolic congestive heart failure Problem #5 history of sleep apnea syndrome Problem #6 hyperlipidemia problem #7 hypothyroidism problem #8 history of CAD problem #9 renal sufficiency Problem #10 history of folic acid deficiency on replacement folic acid. The use of oral iron and the Aranesp has not budged her hemoglobin. She doesn't feel any different. She does not want a bone marrow aspirate and biopsy whatsoever. So I will check some blood work today to make sure not missing anything and if we do not find anything we will stop the Aranesp and just observe her monthly for the next 3 months. We will then see her after that. She asked a number of questions about her kidneys what an elevated creatinine means etc. She has never seen a kidney doctor but she does have an elevated creatinine and decreased filtration rate. I think I answered all of her questions. I think the reason transfuse her in the future is significant change in her breathing weakness change in fatigue chest pain etc. I don't think we should transfuse her if her hemoglobin is 9 g or greater

## 2012-02-21 NOTE — Telephone Encounter (Signed)
CRITICAL VALUE ALERT Critical value received:  CO2 of 42  Date of notification:  02/21/12 Time of notification: 1520 Critical value read back:  yes Nurse who received alert:  Tobie Lords, RN MD notified (1st page):  Dr. [name]

## 2012-02-21 NOTE — Progress Notes (Signed)
Kara [name] presented for labwork. Labs per MD order drawn via Peripheral Line 23 gauge needle inserted in right AC  Good blood return present. Procedure without incident.  Needle removed intact. Patient tolerated procedure well.

## 2012-02-21 NOTE — Progress Notes (Signed)
Labs drawn today for cbc 

## 2012-02-21 NOTE — Progress Notes (Signed)
Kara [name] presents today for injection per MD orders. Aranesp 500 mcg administered SQ in right Abdomen. Administration without incident. Patient tolerated well.

## 2012-02-22 ENCOUNTER — Telehealth (HOSPITAL_COMMUNITY): Payer: Self-pay

## 2012-02-22 LAB — IRON AND TIBC
Iron: 91 ug/dL (ref 42–135)
Saturation Ratios: 27 % (ref 20–55)
TIBC: 341 ug/dL (ref 250–470)
UIBC: 250 ug/dL (ref 125–400)

## 2012-02-22 LAB — FERRITIN: Ferritin: 371 ng/mL — ABNORMAL HIGH (ref 10–291)

## 2012-02-22 LAB — FOLATE: Folate: >20 ng/mL

## 2012-02-22 NOTE — Telephone Encounter (Signed)
Message left to inform patient of lab results and that she will not need any supplemental iron.  Instructed to call back with any questions.

## 2012-02-23 ENCOUNTER — Ambulatory Visit (HOSPITAL_COMMUNITY): Payer: Medicare Other | Admitting: Oncology

## 2012-03-13 ENCOUNTER — Ambulatory Visit (HOSPITAL_COMMUNITY): Payer: Medicare Other

## 2012-03-20 ENCOUNTER — Encounter (HOSPITAL_BASED_OUTPATIENT_CLINIC_OR_DEPARTMENT_OTHER): Payer: Medicare Other

## 2012-03-20 ENCOUNTER — Encounter (HOSPITAL_COMMUNITY): Payer: Medicare Other | Attending: Oncology

## 2012-03-20 DIAGNOSIS — E039 Hypothyroidism, unspecified: Secondary | ICD-10-CM | POA: Insufficient documentation

## 2012-03-20 DIAGNOSIS — I2510 Atherosclerotic heart disease of native coronary artery without angina pectoris: Secondary | ICD-10-CM

## 2012-03-20 DIAGNOSIS — N289 Disorder of kidney and ureter, unspecified: Secondary | ICD-10-CM

## 2012-03-20 DIAGNOSIS — I5032 Chronic diastolic (congestive) heart failure: Secondary | ICD-10-CM | POA: Insufficient documentation

## 2012-03-20 DIAGNOSIS — J449 Chronic obstructive pulmonary disease, unspecified: Secondary | ICD-10-CM | POA: Insufficient documentation

## 2012-03-20 DIAGNOSIS — J4489 Other specified chronic obstructive pulmonary disease: Secondary | ICD-10-CM | POA: Insufficient documentation

## 2012-03-20 DIAGNOSIS — D649 Anemia, unspecified: Secondary | ICD-10-CM | POA: Insufficient documentation

## 2012-03-20 DIAGNOSIS — I10 Essential (primary) hypertension: Secondary | ICD-10-CM

## 2012-03-20 LAB — CBC
HCT: 30.3 % — ABNORMAL LOW (ref 36.0–46.0)
RBC: 3.18 MIL/uL — ABNORMAL LOW (ref 3.87–5.11)
RDW: 15.4 % (ref 11.5–15.5)
WBC: 5.2 10*3/uL (ref 4.0–10.5)

## 2012-03-20 MED ORDER — DARBEPOETIN ALFA-POLYSORBATE 500 MCG/ML IJ SOLN
500.0000 ug | Freq: Once | INTRAMUSCULAR | Status: AC
  Administered 2012-03-20: 500 ug via SUBCUTANEOUS

## 2012-03-20 MED ORDER — DARBEPOETIN ALFA-POLYSORBATE 500 MCG/ML IJ SOLN
INTRAMUSCULAR | Status: AC
  Filled 2012-03-20: qty 1

## 2012-03-20 NOTE — Progress Notes (Signed)
Kara [name]'s reason for visit today is for an injection and labs as scheduled per MD orders.  Labs were drawn prior to administration of ordered medication.  Venipuncture performed with a 23 gauge butterfly needle to R Antecubital.  Kara [name] also received Aranesp per MD orders; see [facility] for administration details.  Kara [name] tolerated all procedures well and without incident; questions were answered and patient was discharged.

## 2012-03-20 NOTE — Progress Notes (Signed)
Labs drawn today for cbc 

## 2012-03-21 ENCOUNTER — Emergency Department: Payer: Self-pay | Admitting: Unknown Physician Specialty

## 2012-03-22 ENCOUNTER — Telehealth (HOSPITAL_COMMUNITY): Payer: Self-pay

## 2012-03-22 NOTE — Telephone Encounter (Signed)
Message copied by Evelena Leyden on Fri Mar 22, 2012  5:26 PM ------      Message from: [name], ERIC S      Created: Wed Mar 20, 2012 12:41 PM       No change how does she feel??

## 2012-03-22 NOTE — Telephone Encounter (Signed)
Unable to reach, left message for patient to call clinic back.

## 2012-03-26 ENCOUNTER — Other Ambulatory Visit (HOSPITAL_COMMUNITY): Payer: Self-pay | Admitting: Family Medicine

## 2012-03-26 ENCOUNTER — Ambulatory Visit (HOSPITAL_COMMUNITY)
Admission: RE | Admit: 2012-03-26 | Discharge: 2012-03-26 | Disposition: A | Discharge status: Home / Self Care | Payer: Medicare Other | Source: Ambulatory Visit | Attending: Family Medicine | Admitting: Family Medicine

## 2012-03-26 DIAGNOSIS — S0990XA Unspecified injury of head, initial encounter: Secondary | ICD-10-CM | POA: Insufficient documentation

## 2012-03-26 DIAGNOSIS — W19XXXA Unspecified fall, initial encounter: Secondary | ICD-10-CM | POA: Insufficient documentation

## 2012-03-26 DIAGNOSIS — R51 Headache: Secondary | ICD-10-CM

## 2012-04-08 ENCOUNTER — Encounter (HOSPITAL_COMMUNITY): Payer: Self-pay | Admitting: Oncology

## 2012-04-08 ENCOUNTER — Other Ambulatory Visit (INDEPENDENT_AMBULATORY_CARE_PROVIDER_SITE_OTHER): Payer: Self-pay | Admitting: Internal Medicine

## 2012-04-08 ENCOUNTER — Other Ambulatory Visit (HOSPITAL_COMMUNITY): Payer: Self-pay | Admitting: Oncology

## 2012-04-08 DIAGNOSIS — D638 Anemia in other chronic diseases classified elsewhere: Secondary | ICD-10-CM

## 2012-04-08 DIAGNOSIS — E538 Deficiency of other specified B group vitamins: Secondary | ICD-10-CM

## 2012-04-08 MED ORDER — FOLIC ACID 1 MG PO TABS: 1.0000 mg | ORAL_TABLET | Freq: Every day | ORAL | Status: DC

## 2012-04-10 ENCOUNTER — Encounter (HOSPITAL_BASED_OUTPATIENT_CLINIC_OR_DEPARTMENT_OTHER): Payer: Medicare Other

## 2012-04-10 ENCOUNTER — Encounter (HOSPITAL_COMMUNITY): Payer: Medicare Other | Attending: Oncology

## 2012-04-10 VITALS — BP 104/55 | HR 59 | Resp 22

## 2012-04-10 DIAGNOSIS — I10 Essential (primary) hypertension: Secondary | ICD-10-CM

## 2012-04-10 DIAGNOSIS — D649 Anemia, unspecified: Secondary | ICD-10-CM | POA: Insufficient documentation

## 2012-04-10 DIAGNOSIS — E039 Hypothyroidism, unspecified: Secondary | ICD-10-CM | POA: Insufficient documentation

## 2012-04-10 DIAGNOSIS — J4489 Other specified chronic obstructive pulmonary disease: Secondary | ICD-10-CM | POA: Insufficient documentation

## 2012-04-10 DIAGNOSIS — N289 Disorder of kidney and ureter, unspecified: Secondary | ICD-10-CM

## 2012-04-10 DIAGNOSIS — J449 Chronic obstructive pulmonary disease, unspecified: Secondary | ICD-10-CM | POA: Insufficient documentation

## 2012-04-10 DIAGNOSIS — I2510 Atherosclerotic heart disease of native coronary artery without angina pectoris: Secondary | ICD-10-CM

## 2012-04-10 DIAGNOSIS — I5032 Chronic diastolic (congestive) heart failure: Secondary | ICD-10-CM

## 2012-04-10 LAB — CBC
MCV: 96.2 fL (ref 78.0–100.0)
Platelets: 234 10*3/uL (ref 150–400)
RDW: 15.8 % — ABNORMAL HIGH (ref 11.5–15.5)
WBC: 4.9 10*3/uL (ref 4.0–10.5)

## 2012-04-10 MED ORDER — DARBEPOETIN ALFA-POLYSORBATE 500 MCG/ML IJ SOLN
INTRAMUSCULAR | Status: AC
  Filled 2012-04-10: qty 1

## 2012-04-10 MED ORDER — DARBEPOETIN ALFA-POLYSORBATE 500 MCG/ML IJ SOLN
500.0000 ug | Freq: Once | INTRAMUSCULAR | Status: AC
  Administered 2012-04-10: 500 ug via SUBCUTANEOUS

## 2012-04-10 NOTE — Progress Notes (Signed)
Kara [name] presents today for injection per MD orders. Aranesp 500 mcg administered SQ in right Abdomen. Administration without incident. Patient tolerated well.  

## 2012-04-10 NOTE — Progress Notes (Signed)
Labs drawn today for cbc 

## 2012-05-01 ENCOUNTER — Encounter (HOSPITAL_COMMUNITY): Payer: Medicare Other | Attending: Oncology

## 2012-05-01 ENCOUNTER — Encounter (HOSPITAL_BASED_OUTPATIENT_CLINIC_OR_DEPARTMENT_OTHER): Payer: Medicare Other

## 2012-05-01 VITALS — BP 103/46 | HR 60

## 2012-05-01 DIAGNOSIS — J449 Chronic obstructive pulmonary disease, unspecified: Secondary | ICD-10-CM

## 2012-05-01 DIAGNOSIS — N289 Disorder of kidney and ureter, unspecified: Secondary | ICD-10-CM

## 2012-05-01 DIAGNOSIS — J4489 Other specified chronic obstructive pulmonary disease: Secondary | ICD-10-CM | POA: Insufficient documentation

## 2012-05-01 DIAGNOSIS — I10 Essential (primary) hypertension: Secondary | ICD-10-CM

## 2012-05-01 DIAGNOSIS — D649 Anemia, unspecified: Secondary | ICD-10-CM

## 2012-05-01 DIAGNOSIS — I2510 Atherosclerotic heart disease of native coronary artery without angina pectoris: Secondary | ICD-10-CM

## 2012-05-01 DIAGNOSIS — E039 Hypothyroidism, unspecified: Secondary | ICD-10-CM

## 2012-05-01 DIAGNOSIS — I5032 Chronic diastolic (congestive) heart failure: Secondary | ICD-10-CM

## 2012-05-01 DIAGNOSIS — D638 Anemia in other chronic diseases classified elsewhere: Secondary | ICD-10-CM | POA: Insufficient documentation

## 2012-05-01 LAB — CBC
HCT: 34.3 % — ABNORMAL LOW (ref 36.0–46.0)
Hemoglobin: 10.2 g/dL — ABNORMAL LOW (ref 12.0–15.0)
MCHC: 29.7 g/dL — ABNORMAL LOW (ref 30.0–36.0)
MCV: 95.5 fL (ref 78.0–100.0)
RDW: 15.6 % — ABNORMAL HIGH (ref 11.5–15.5)
WBC: 5.8 10*3/uL (ref 4.0–10.5)

## 2012-05-01 MED ORDER — DARBEPOETIN ALFA-POLYSORBATE 500 MCG/ML IJ SOLN
INTRAMUSCULAR | Status: AC
  Filled 2012-05-01: qty 1

## 2012-05-01 MED ORDER — DARBEPOETIN ALFA-POLYSORBATE 500 MCG/ML IJ SOLN
500.0000 ug | Freq: Once | INTRAMUSCULAR | Status: AC
  Administered 2012-05-01: 500 ug via SUBCUTANEOUS

## 2012-05-01 NOTE — Progress Notes (Signed)
Labs drawn today for cbc 

## 2012-05-01 NOTE — Progress Notes (Signed)
Kara [name] presents today for injection per MD orders. Aranesp 500 mcg administered SQ in right Abdomen. Administration without incident. Patient tolerated well.  

## 2012-05-14 ENCOUNTER — Encounter (HOSPITAL_COMMUNITY): Payer: Self-pay | Admitting: Dietician

## 2012-05-14 NOTE — Progress Notes (Signed)
[facility] Diabetes Class Completion  Date:May 14, 2012  Time: 10:00 AM  Pt attended [facility]'s Diabetes Group Education Class on May 14, 2012.   Patient was educated on the following topics: survival skills (signs and symptoms of hyperglycemia and hypoglycemia, treatment for hypoglycemia, ideal levels for fasting and postprandial blood sugars, goal Hgb A1c level, foot care basics), recommendations for physical activity, carbohydrate metabolism in relation to diabetes, and meal planning (sources of carbohydrate, carbohydrate counting, meal planning strategies, food label reading, and portion control).   Kara [name] A. Kayan, RD, LDN   

## 2012-05-15 ENCOUNTER — Encounter (HOSPITAL_BASED_OUTPATIENT_CLINIC_OR_DEPARTMENT_OTHER): Payer: Medicare Other | Admitting: Oncology

## 2012-05-15 VITALS — BP 108/57 | HR 59 | Temp 98.3°F | Resp 20 | Wt 162.8 lb

## 2012-05-15 DIAGNOSIS — E119 Type 2 diabetes mellitus without complications: Secondary | ICD-10-CM

## 2012-05-15 DIAGNOSIS — N289 Disorder of kidney and ureter, unspecified: Secondary | ICD-10-CM

## 2012-05-15 DIAGNOSIS — J449 Chronic obstructive pulmonary disease, unspecified: Secondary | ICD-10-CM

## 2012-05-15 DIAGNOSIS — D638 Anemia in other chronic diseases classified elsewhere: Secondary | ICD-10-CM

## 2012-05-15 LAB — CBC WITH DIFFERENTIAL/PLATELET
Basophils Relative: 0 % (ref 0–1)
Eosinophils Absolute: 0.2 10*3/uL (ref 0.0–0.7)
Eosinophils Relative: 4 % (ref 0–5)
Hemoglobin: 10.7 g/dL — ABNORMAL LOW (ref 12.0–15.0)
Lymphs Abs: 1.7 10*3/uL (ref 0.7–4.0)
MCH: 28.1 pg (ref 26.0–34.0)
MCHC: 30.1 g/dL (ref 30.0–36.0)
MCV: 93.2 fL (ref 78.0–100.0)
Monocytes Relative: 9 % (ref 3–12)
Neutrophils Relative %: 54 % (ref 43–77)
RBC: 3.81 MIL/uL — ABNORMAL LOW (ref 3.87–5.11)

## 2012-05-15 NOTE — Patient Instructions (Addendum)
[facility]  Discharge Instructions  RECOMMENDATIONS MADE BY THE CONSULTANT AND ANY TEST RESULTS WILL BE SENT TO YOUR REFERRING DOCTOR.   EXAM FINDINGS BY MD TODAY AND SIGNS AND SYMPTOMS TO REPORT TO CLINIC OR PRIMARY MD: Discussion by MD.  We will stop the aranesp because it's not working.  We will check some blood work today and see what your sedimentation rate is.    MEDICATIONS PRESCRIBED: none   INSTRUCTIONS GIVEN AND DISCUSSED: Other :  Report increasing fatigue or increasing shortness of breath.  SPECIAL INSTRUCTIONS/FOLLOW-UP: Lab work Needed today and in December and Return to Clinic in December after blood work.   I acknowledge that I have been informed and understand all the instructions given to me and received a copy. I do not have any more questions at this time, but understand that I may call the Specialty Clinic at [facility] at [phone] during business hours should I have any further questions or need assistance in obtaining follow-up care.    __________________________________________  _____________  __________ Signature of Patient or Authorized Representative            Date                   Time    __________________________________________ Nurse's Signature

## 2012-05-15 NOTE — Progress Notes (Signed)
Diagnosis #1 anemia of chronic disease Diagnoses #2 severe COPD on oxygen Diagnosis #3 diabetes mellitus Diagnosed #4 sleep apnea syndrome Diagnoses #5 diastolic congestive heart failure Diagnoses #6 hypothyroidism Diagnosis #7 hyperlipidemia Diagnoses #8 CAD Diagnoses #9 renal sufficiency Diagnosis #10 folic acid deficiency Diagnoses #11 degenerative joint disease of the back She is not any better even though her hemoglobin which started out just above 8 g is now above 10 g. She is not any stronger. She is not having any more energy. It's time to stop the Aranesp. So we will do that. I will check a CBC and sedimentation rate today and tentatively see her back in 6 weeks, we will see what her hemoglobin is

## 2012-05-15 NOTE — Progress Notes (Signed)
Kara [name] presented for labwork. Labs per MD order drawn via Peripheral Line 23 gauge needle inserted in right forearm  Good blood return present. Procedure without incident.  Needle removed intact. Patient tolerated procedure well.   

## 2012-05-20 ENCOUNTER — Other Ambulatory Visit (HOSPITAL_COMMUNITY): Payer: Self-pay

## 2012-05-20 DIAGNOSIS — D638 Anemia in other chronic diseases classified elsewhere: Secondary | ICD-10-CM

## 2012-05-22 ENCOUNTER — Ambulatory Visit (HOSPITAL_COMMUNITY): Payer: Medicare Other

## 2012-05-22 ENCOUNTER — Other Ambulatory Visit (HOSPITAL_COMMUNITY): Payer: Medicare Other

## 2012-05-23 ENCOUNTER — Encounter (HOSPITAL_COMMUNITY): Payer: Medicare Other

## 2012-05-23 DIAGNOSIS — D638 Anemia in other chronic diseases classified elsewhere: Secondary | ICD-10-CM

## 2012-05-23 NOTE — Progress Notes (Signed)
Kara [name] presented for labwork. Labs per MD order drawn via Peripheral Line 23 gauge needle inserted in rt forearm  Good blood return present. Procedure without incident.  Needle removed intact. Patient tolerated procedure well.

## 2012-06-12 ENCOUNTER — Telehealth (HOSPITAL_COMMUNITY): Payer: Self-pay

## 2012-06-12 NOTE — Telephone Encounter (Signed)
Call from patient stating "I'm scheduled for blood work on 12/16 and to see Dr. [name] on 12/17.  I don't see the need to come in 2 different days.  If he thinks I really need the blood work, I'll do it when I see him on 12/17."  Appointment for blood work on 12/16 cancelled per patient's request.

## 2012-07-08 ENCOUNTER — Other Ambulatory Visit (HOSPITAL_COMMUNITY): Payer: Medicare Other

## 2012-07-09 ENCOUNTER — Encounter (HOSPITAL_COMMUNITY): Payer: Medicare Other | Attending: Oncology | Admitting: Oncology

## 2012-07-09 ENCOUNTER — Encounter (HOSPITAL_COMMUNITY): Payer: Self-pay | Admitting: Oncology

## 2012-07-09 VITALS — BP 94/57 | HR 57 | Temp 98.2°F | Resp 20 | Wt 168.2 lb

## 2012-07-09 DIAGNOSIS — I2510 Atherosclerotic heart disease of native coronary artery without angina pectoris: Secondary | ICD-10-CM

## 2012-07-09 DIAGNOSIS — N289 Disorder of kidney and ureter, unspecified: Secondary | ICD-10-CM

## 2012-07-09 DIAGNOSIS — E039 Hypothyroidism, unspecified: Secondary | ICD-10-CM

## 2012-07-09 DIAGNOSIS — D638 Anemia in other chronic diseases classified elsewhere: Secondary | ICD-10-CM

## 2012-07-09 LAB — CBC WITH DIFFERENTIAL/PLATELET
Eosinophils Relative: 2 % (ref 0–5)
HCT: 27.5 % — ABNORMAL LOW (ref 36.0–46.0)
Hemoglobin: 8.4 g/dL — ABNORMAL LOW (ref 12.0–15.0)
Lymphocytes Relative: 30 % (ref 12–46)
Lymphs Abs: 1.9 10*3/uL (ref 0.7–4.0)
MCH: 29.2 pg (ref 26.0–34.0)
MCV: 95.5 fL (ref 78.0–100.0)
Monocytes Relative: 10 % (ref 3–12)
Platelets: 228 10*3/uL (ref 150–400)
RBC: 2.88 MIL/uL — ABNORMAL LOW (ref 3.87–5.11)
WBC: 6.4 10*3/uL (ref 4.0–10.5)

## 2012-07-09 NOTE — Patient Instructions (Addendum)
[facility] Discharge Instructions  RECOMMENDATIONS MADE BY THE CONSULTANT AND ANY TEST RESULTS WILL BE SENT TO YOUR REFERRING PHYSICIAN.  EXAM FINDINGS BY THE PHYSICIAN TODAY AND SIGNS OR SYMPTOMS TO REPORT TO CLINIC OR PRIMARY PHYSICIAN: Discussion by MD  MEDICATIONS PRESCRIBED:  none  INSTRUCTIONS GIVEN AND DISCUSSED: Report increased shortness of breath or other problems  SPECIAL INSTRUCTIONS/FOLLOW-UP: Return in 6 months to see MD.  Thank you for choosing [facility] to provide your oncology and hematology care.  To afford each patient quality time with our providers, please arrive at least 15 minutes before your scheduled appointment time.  With your help, our goal is to use those 15 minutes to complete the necessary work-up to ensure our physicians have the information they need to help with your evaluation and healthcare recommendations.    Effective January 1st, 2014, we ask that you re-schedule your appointment with our physicians should you arrive 10 or more minutes late for your appointment.  We strive to give you quality time with our providers, and arriving late affects you and other patients whose appointments are after yours.    Again, thank you for choosing [facility].  Our hope is that these requests will decrease the amount of time that you wait before being seen by our physicians.       _____________________________________________________________  I acknowledge that I have been informed and understand all the instructions given to me and received a copy. I do not have anymore questions at this time but understand that I may call the Cancer Center at [facility] at [phone] during business hours should I have any further questions or need assistance in obtaining follow-up care.    __________________________________________  _____________  __________ Signature of Patient or Authorized Representative            Date                    Time    __________________________________________ Nurse's Signature

## 2012-07-10 NOTE — Progress Notes (Signed)
Problem #1 anemia of chronic disease Problem #2 severe COPD on around-the-clock oxygen Problem #3 diabetes mellitus Problem #4 sleep apnea Problem #5 diastolic congestive heart Problem #6 hypothyroidism Problem#7 hyperlipidemia Problem #8 renal insufficiency Problem #9 folic acid deficiency in the past Problem #10 degenerative joint disease Problem #11 CAD Her hemoglobin is less than was before and basically back to her baseline. She does not feel any different. She is not feel more short of breath or more winded. She cannot tell any difference between his hemoglobin of just over 8-1 which is over 10 g. Therefore we need to leave her alone adjust see her back in 6 months to see how she's doing. I think going forward if she is really short winded one would have to consider transfusion therapy to see if it makes any difference essentially before one goes back on erythropoietin-like agents.

## 2012-08-24 ENCOUNTER — Emergency Department (HOSPITAL_COMMUNITY)
Admission: EM | Admit: 2012-08-24 | Discharge: 2012-08-24 | Disposition: A | Discharge status: Home / Self Care | Payer: Medicare Other | Attending: Emergency Medicine | Admitting: Emergency Medicine

## 2012-08-24 ENCOUNTER — Ambulatory Visit (HOSPITAL_COMMUNITY)
Admission: RE | Admit: 2012-08-24 | Discharge: 2012-08-24 | Disposition: A | Discharge status: Home / Self Care | Payer: Medicare Other | Source: Ambulatory Visit | Attending: Neurology | Admitting: Neurology

## 2012-08-24 DIAGNOSIS — R0609 Other forms of dyspnea: Secondary | ICD-10-CM | POA: Insufficient documentation

## 2012-08-24 DIAGNOSIS — R7981 Abnormal blood-gas level: Secondary | ICD-10-CM | POA: Insufficient documentation

## 2012-08-24 DIAGNOSIS — R0989 Other specified symptoms and signs involving the circulatory and respiratory systems: Secondary | ICD-10-CM | POA: Insufficient documentation

## 2012-08-25 LAB — BLOOD GAS, ARTERIAL
Acid-Base Excess: 11.2 mmol/L — ABNORMAL HIGH (ref 0.0–2.0)
Bicarbonate: 36.6 mEq/L — ABNORMAL HIGH (ref 20.0–24.0)
O2 Content: 4 L/min
pCO2 arterial: 62.5 mmHg (ref 35.0–45.0)
pO2, Arterial: 84.5 mmHg (ref 80.0–100.0)

## 2012-10-08 ENCOUNTER — Ambulatory Visit (INDEPENDENT_AMBULATORY_CARE_PROVIDER_SITE_OTHER): Payer: Medicare Other | Admitting: Internal Medicine

## 2012-10-21 ENCOUNTER — Ambulatory Visit (INDEPENDENT_AMBULATORY_CARE_PROVIDER_SITE_OTHER): Payer: Medicare Other | Admitting: Internal Medicine

## 2012-10-21 ENCOUNTER — Encounter (INDEPENDENT_AMBULATORY_CARE_PROVIDER_SITE_OTHER): Payer: Self-pay | Admitting: Internal Medicine

## 2012-10-21 VITALS — BP 118/78 | HR 72 | Temp 99.0°F | Resp 18 | Ht 66.0 in | Wt 176.0 lb

## 2012-10-21 DIAGNOSIS — R112 Nausea with vomiting, unspecified: Secondary | ICD-10-CM

## 2012-10-21 DIAGNOSIS — K219 Gastro-esophageal reflux disease without esophagitis: Secondary | ICD-10-CM

## 2012-10-21 MED ORDER — PANTOPRAZOLE SODIUM 40 MG PO TBEC: 40.0000 mg | DELAYED_RELEASE_TABLET | Freq: Two times a day (BID) | ORAL | Status: DC

## 2012-10-21 MED ORDER — ONDANSETRON HCL 4 MG PO TABS: 4.0000 mg | ORAL_TABLET | Freq: Two times a day (BID) | ORAL | Status: DC | PRN

## 2012-10-21 NOTE — Progress Notes (Signed)
Presenting complaint;  Nausea and vomiting. Medication is not working.  Subjective:  Patient is 67 year old Caucasian female who is here for scheduled visit. She was last seen by me in [place] office over 3 years ago. She has multiple medical problems. She has advanced COPD and is  O2 dependent. She presents with several week history of nausea and vomiting when she wakes up following mid-day nap. She takes nap within an hour of eating her lunch and generally sleeps flat. She vomits food that she has recently eaten. She does not experience heartburn often. She believes Dexilant is not working anymore. She continues to complain of pain in left upper quadrant of her abdomen which is more or less chronic pain but seemed to ease when she has bowel movements. She has chronic constipation but bowels move daily as long as she stays on her medications. Her appetite is fair. She has not lost any weight this year. She denies hematemesis melena or rectal bleeding. She also complains of chronic back pain and remains on narcotic therapy. She lives alone. She has sister who lives in [place] and stays in those contact with patient. She has never smoked cigarettes and does not drink alcohol.   Current Medications: Current Outpatient Prescriptions  Medication Sig Dispense Refill  . acetaminophen (TYLENOL) 325 MG tablet Take 650 mg by mouth every 6 (six) hours as needed.        . ALPRAZolam (XANAX) 1 MG tablet Take 1 mg by mouth 3 (three) times daily.      . citalopram (CELEXA) 20 MG tablet Take 20 mg by mouth daily.       [name] DEXILANT 60 MG capsule TAKE ONE (1) CAPSULE EACH DAY  30 capsule  11  . fenofibrate 160 MG tablet Take 160 mg by mouth at bedtime.      . furosemide (LASIX) 20 MG tablet Take 80 mg by mouth.       . levothyroxine (SYNTHROID, LEVOTHROID) 100 MCG tablet Take 100 mcg by mouth daily.        . metFORMIN (GLUCOPHAGE) 1000 MG tablet Take 1,000 mg by mouth 2 (two) times daily with a meal.       .  metoprolol tartrate (LOPRESSOR) 25 MG tablet Take 25 mg by mouth 3 (three) times daily.       [name] morphine (MS CONTIN) 30 MG 12 hr tablet Take 30 mg by mouth 3 (three) times daily. To treat pain associated with 3 bulging disc in back      . Nebulizer MISC by Does not apply route as directed.        . nitroGLYCERIN (NITROSTAT) 0.4 MG SL tablet Place 0.4 mg under the tongue every 5 (five) minutes as needed.        . polyethylene glycol (MIRALAX / GLYCOLAX) packet Take 17 g by mouth daily.      . potassium chloride (K-DUR) 10 MEQ tablet Take 20 mEq by mouth 2 (two) times daily.       . folic acid (FOLVITE) 1 MG tablet Take 1 tablet (1 mg total) by mouth daily.  30 tablet  4  . lubiprostone (AMITIZA) 24 MCG capsule Take 24 mcg by mouth 2 (two) times daily with a meal.       No current facility-administered medications for this visit.   Past medical history; Past Medical History  Diagnosis Date  . Type 2 diabetes mellitus   . Arthritis   . COPD (chronic obstructive pulmonary disease)  Home oxygen  . Palpitations     Reports history of atrial fibrillation, although none seen recently  . Coronary atherosclerosis of native coronary artery     Nonobstructive at catheterization 2008  . GERD (gastroesophageal reflux disease)   . Irritable bowel syndrome   . Chronic anemia   . Hypothyroidism   . Hyperlipidemia   . Chronic edema   . Pneumonia   . Diabetes mellitus   . Hypertension   . Chronic pain   . Anemia 08/04/2011  . Renal insufficiency 08/07/2011  . Anemia of chronic disease 08/04/2011  . Bulging lumbar disc     3  Last EGD was in January 2007 and she also had pH study. Last colonoscopy was in March 2005 revealing left-sided diverticulosis and external hemorrhoids.   Objective: Blood pressure 118/78, pulse 72, temperature 99 F (37.2 C), temperature source Oral, resp. rate 18, height 5\' 6"  (1.676 m), weight 176 lb (79.833 kg). Patient is alert and in no acute distress. She has  nasal O2 at 3 L per minute. Conjunctiva is pink. Sclera is nonicteric Oropharyngeal mucosa is normal. No neck masses or thyromegaly noted. Cardiac exam with regular rhythm normal S1 and S2. No murmur or gallop noted. Auscultation of lungs revealed diminished breath sounds bilaterally. No rales or rhonchi noted. Abdomen is symmetrical. No bruits noted. Abdomen is soft with mild tenderness below the left costal margin. No organomegaly or masses noted. No LE edema or clubbing noted.   Assessment:  #1. Recurrent nausea and vomiting. 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 She may be able to prevent or decrease frequency of nausea and vomiting if she is able to delay her nap for 2 hours after lunch and also sleep in a recliner or on a wedge pillow. #2. Chronic GERD. Heartburn appears to be well controlled with present medication. #3. Chronic abdominal pain. She has undergone extensive evaluation in the past. #4. Chronic constipation. She is doing well with therapy.   Plan:  Discontinue Dexlansoprazole. Pantoprazole 40 mg by mouth twice a day. Patient advised to eat lunch at least 2 hours before her nap and keep head end of bed elevated while she is napping. Discontinue promethazine. Use ondansetron 4 mg twice a day when necessary. Keep symptom diary until office visit in 6 weeks.

## 2012-10-21 NOTE — Patient Instructions (Signed)
Symptom diary until office visit. Wait for 2 was after lunch for you to take nap

## 2012-10-29 ENCOUNTER — Telehealth (INDEPENDENT_AMBULATORY_CARE_PROVIDER_SITE_OTHER): Payer: Self-pay | Admitting: *Deleted

## 2012-10-29 NOTE — Telephone Encounter (Signed)
LM asking Kara [name] to please return her call. She has lost a Rx.

## 2012-10-30 NOTE — Telephone Encounter (Signed)
Patient called and both prescription were faxed to [facility]

## 2012-11-25 ENCOUNTER — Ambulatory Visit (HOSPITAL_COMMUNITY)
Admission: RE | Admit: 2012-11-25 | Discharge: 2012-11-25 | Disposition: A | Discharge status: Home / Self Care | Payer: Medicare Other | Source: Ambulatory Visit | Attending: Pulmonary Disease | Admitting: Pulmonary Disease

## 2012-11-25 ENCOUNTER — Other Ambulatory Visit (HOSPITAL_COMMUNITY): Payer: Self-pay | Admitting: Pulmonary Disease

## 2012-11-25 DIAGNOSIS — I10 Essential (primary) hypertension: Secondary | ICD-10-CM | POA: Insufficient documentation

## 2012-11-25 DIAGNOSIS — J4489 Other specified chronic obstructive pulmonary disease: Secondary | ICD-10-CM | POA: Insufficient documentation

## 2012-11-25 DIAGNOSIS — J449 Chronic obstructive pulmonary disease, unspecified: Secondary | ICD-10-CM | POA: Insufficient documentation

## 2012-11-25 DIAGNOSIS — R0602 Shortness of breath: Secondary | ICD-10-CM

## 2012-11-25 DIAGNOSIS — E119 Type 2 diabetes mellitus without complications: Secondary | ICD-10-CM | POA: Insufficient documentation

## 2012-12-02 ENCOUNTER — Encounter (INDEPENDENT_AMBULATORY_CARE_PROVIDER_SITE_OTHER): Payer: Self-pay | Admitting: Internal Medicine

## 2012-12-02 ENCOUNTER — Ambulatory Visit (INDEPENDENT_AMBULATORY_CARE_PROVIDER_SITE_OTHER): Payer: Medicare Other | Admitting: Internal Medicine

## 2012-12-02 VITALS — BP 110/70 | HR 74 | Temp 98.5°F | Resp 18 | Ht 66.0 in | Wt 169.6 lb

## 2012-12-02 DIAGNOSIS — R112 Nausea with vomiting, unspecified: Secondary | ICD-10-CM

## 2012-12-02 DIAGNOSIS — R1084 Generalized abdominal pain: Secondary | ICD-10-CM

## 2012-12-02 DIAGNOSIS — R634 Abnormal weight loss: Secondary | ICD-10-CM

## 2012-12-02 NOTE — Patient Instructions (Signed)
Please do not take Dexilant while you are on Pantoprazole. Physician will contact you with results of CT completed.

## 2012-12-02 NOTE — Progress Notes (Signed)
Presenting complaint;  Followup for nausea and vomiting. Patient complains of abdominal pain and weight loss.  Subjective:  Patient is 67 year old Caucasian female who is here for scheduled visit. She was last seen 6 weeks ago for nausea vomiting chronic abdominal pain and constipation. PPI was changed but she's not taking both the medications. She has noted decrease in the frequency of nausea and vomiting. She has multiple complaints. She was recently begun on prednisone for breathing difficulty. She has 2 more days of prednisone. She continues to complain of generalized abdominal pain which is more pronounced in epigastric region. Pain seemed to get worse with meals. She does not get relief with her bowel movements. Her bowels would not move unless she takes MiraLax. She denies melena or rectal bleeding. He has occasional blood on the tissue on straining. Most spelled the for nausea vomiting recur after lunch when she wakes up from 2-3 hour nap. She remains with frequent burping. She denies hematemesis. She also complains of night sweats at least 4-5 times a week but she denies fever. She is worried about her weight loss. She states she has a good appetite. She lost 30 pounds last year and she has lost close to 7 pounds since her visit 6 weeks ago. She continues to complain of hip pain and chronic low back pain. She gets some relief with pain medication.  Current Medications: Current Outpatient Prescriptions  Medication Sig Dispense Refill  . acetaminophen (TYLENOL) 325 MG tablet Take 650 mg by mouth every 6 (six) hours as needed.        . ALPRAZolam (XANAX) 1 MG tablet Take 1 mg by mouth 3 (three) times daily.      . citalopram (CELEXA) 20 MG tablet Take 20 mg by mouth 2 (two) times daily.       [name] dexlansoprazole (DEXILANT) 60 MG capsule Take 60 mg by mouth daily.      . fenofibrate 160 MG tablet Take 160 mg by mouth at bedtime.      . furosemide (LASIX) 20 MG tablet Take 40 mg by mouth. Patient  states that she takes( 2) 40 mg tablets by mouth in the morning , and (2) 40 mg tablets by mouth in the evening      . levothyroxine (SYNTHROID, LEVOTHROID) 100 MCG tablet Take 100 mcg by mouth daily.        . metFORMIN (GLUCOPHAGE) 1000 MG tablet Take 1,000 mg by mouth 2 (two) times daily with a meal.       . metoprolol tartrate (LOPRESSOR) 25 MG tablet Take 25 mg by mouth 2 (two) times daily.       [name] morphine (MS CONTIN) 30 MG 12 hr tablet Take 30 mg by mouth 3 (three) times daily. To treat pain associated with 3 bulging disc in back      . Nebulizer MISC by Does not apply route as directed.        . nitroGLYCERIN (NITROSTAT) 0.4 MG SL tablet Place 0.4 mg under the tongue every 5 (five) minutes as needed.        . ondansetron (ZOFRAN) 4 MG tablet Take 1 tablet (4 mg total) by mouth every 12 (twelve) hours as needed for nausea.  20 tablet  1  . polyethylene glycol (MIRALAX / GLYCOLAX) packet Take 17 g by mouth 2 (two) times daily.       . potassium chloride (K-DUR) 10 MEQ tablet Take 20 mEq by mouth 2 (two) times daily.       [name]  predniSONE (STERAPRED UNI-PAK) 10 MG tablet Take 10 mg by mouth. Patient states that Dr.Hawkins put her on this last week      . folic acid (FOLVITE) 1 MG tablet Take 1 tablet (1 mg total) by mouth daily.  30 tablet  4   No current facility-administered medications for this visit.     Objective: Blood pressure 110/70, pulse 74, temperature 98.5 F (36.9 C), temperature source Oral, resp. rate 18, height 5\' 6"  (1.676 m), weight 169 lb 9.6 oz (76.93 kg). Patient is alert and in no acute distress. She is on nasal O2. Conjunctiva is pink. Sclera is nonicteric Oropharyngeal mucosa is normal. No neck masses or thyromegaly noted. Cardiac exam with regular rhythm normal S1 and S2. No murmur or gallop noted. Breath sounds are diminished bilaterally. No rales or rhonchi noted Abdomen is symmetrical. Bowel sounds are normal. No bruits noted. On palpation she has generalized  tenderness which is mild except at is moderate epigastric region. No organomegaly or masses.  No LE edema or clubbing noted.    Assessment:  #1. Chronic nausea and vomiting most likely secondary to narcotic-induced gastroparesis and diabetes. She may be somewhat improved but she is taking both Dexilant and pantoprazole. #2. Chronic abdominal pain appears to have gotten worse since her last visit. 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 #3. Continued weight loss. .   Plan: Patient advised to discontinue Dexilant. Continue pantoprazole at 40 mg by mouth twice a day. Comprehensive chemistry panel. Abdominopelvic CT with contrast.

## 2012-12-04 ENCOUNTER — Telehealth (INDEPENDENT_AMBULATORY_CARE_PROVIDER_SITE_OTHER): Payer: Self-pay | Admitting: *Deleted

## 2012-12-04 NOTE — Telephone Encounter (Signed)
Per Dr.Rehman the patient should have the CT tomorrow as planned. Patient called and made aware.

## 2012-12-04 NOTE — Telephone Encounter (Signed)
Patient called asking to speak with Tammy. "It is very important" Her constipation hit her yesterday and she has been taking what Dr. Karilyn Cota gave her in the past for this. Up all night sick, please return the call at [phone].

## 2012-12-04 NOTE — Telephone Encounter (Signed)
Kara [name] states her pharmacist told her what to do and which medicines to use that Dr. Karilyn Cota had given her.  These are the two medications Dr. Juanetta Gosling put her on that she forgot to bring to the apt: Cephalexin 500 mg and Methy Prednisolone 4 mg (today is her last day on this medicine). Dr. Karilyn Cota had asked her to call the office with this.  Kara [name] still would like to know if her [name] are still constipated, should she have the CT done tomorrow, 12/05/12?

## 2012-12-04 NOTE — Telephone Encounter (Signed)
I spoke with Kara [name] and she says that just a little while ago she had a bowel movement with the use of a enema. States that it was horrible. Patient will have CT tomorrow as planned.

## 2012-12-04 NOTE — Telephone Encounter (Signed)
I have called the patient, someone picked the phone up then it disconnected. I have tried multiple times to call back and the line is continuously busy.

## 2012-12-05 ENCOUNTER — Ambulatory Visit (HOSPITAL_COMMUNITY)
Admission: RE | Admit: 2012-12-05 | Discharge: 2012-12-05 | Disposition: A | Discharge status: Home / Self Care | Payer: Medicare Other | Source: Ambulatory Visit | Attending: Internal Medicine | Admitting: Internal Medicine

## 2012-12-05 DIAGNOSIS — R1084 Generalized abdominal pain: Secondary | ICD-10-CM

## 2012-12-05 DIAGNOSIS — N134 Hydroureter: Secondary | ICD-10-CM | POA: Insufficient documentation

## 2012-12-05 DIAGNOSIS — N135 Crossing vessel and stricture of ureter without hydronephrosis: Secondary | ICD-10-CM | POA: Insufficient documentation

## 2012-12-05 DIAGNOSIS — J4489 Other specified chronic obstructive pulmonary disease: Secondary | ICD-10-CM | POA: Insufficient documentation

## 2012-12-05 DIAGNOSIS — J449 Chronic obstructive pulmonary disease, unspecified: Secondary | ICD-10-CM | POA: Insufficient documentation

## 2012-12-05 DIAGNOSIS — K5730 Diverticulosis of large intestine without perforation or abscess without bleeding: Secondary | ICD-10-CM | POA: Insufficient documentation

## 2012-12-05 DIAGNOSIS — I2510 Atherosclerotic heart disease of native coronary artery without angina pectoris: Secondary | ICD-10-CM | POA: Insufficient documentation

## 2012-12-05 DIAGNOSIS — I10 Essential (primary) hypertension: Secondary | ICD-10-CM | POA: Insufficient documentation

## 2012-12-05 DIAGNOSIS — R634 Abnormal weight loss: Secondary | ICD-10-CM

## 2012-12-05 DIAGNOSIS — E119 Type 2 diabetes mellitus without complications: Secondary | ICD-10-CM | POA: Insufficient documentation

## 2012-12-05 DIAGNOSIS — R599 Enlarged lymph nodes, unspecified: Secondary | ICD-10-CM | POA: Insufficient documentation

## 2012-12-05 MED ORDER — IOHEXOL 300 MG/ML  SOLN
100.0000 mL | Freq: Once | INTRAMUSCULAR | Status: AC | PRN
  Administered 2012-12-05: 100 mL via INTRAVENOUS

## 2012-12-17 ENCOUNTER — Telehealth (INDEPENDENT_AMBULATORY_CARE_PROVIDER_SITE_OTHER): Payer: Self-pay | Admitting: *Deleted

## 2012-12-17 ENCOUNTER — Other Ambulatory Visit (HOSPITAL_COMMUNITY): Payer: Self-pay | Admitting: Oncology

## 2012-12-17 NOTE — Telephone Encounter (Signed)
Per Dr.Rehman , we may refer to Dr.Neijstrom for abnormal lymph node. He states that he will call Dr.Neijstrom today.

## 2012-12-17 NOTE — Telephone Encounter (Signed)
Dr.Rehman has called and spoken with Tom. He was told that they would call patient for an appointment He also called and left a message on [facility]'s voice mail with this information.

## 2012-12-17 NOTE — Telephone Encounter (Signed)
Wondering if Dr. Karilyn Cota was going to refer her to Dr. [name]. Forrest's return phone number is [phone].  Dewayne Hatch has not received a referral request from Dr. Karilyn Cota to do so.

## 2012-12-17 NOTE — Telephone Encounter (Signed)
appt 12/27/12

## 2012-12-27 ENCOUNTER — Encounter (HOSPITAL_COMMUNITY): Payer: Medicare Other | Attending: Hematology and Oncology

## 2012-12-27 ENCOUNTER — Encounter (HOSPITAL_COMMUNITY): Payer: Self-pay

## 2012-12-27 ENCOUNTER — Telehealth (HOSPITAL_COMMUNITY): Payer: Self-pay

## 2012-12-27 VITALS — BP 129/55 | HR 66 | Temp 97.7°F | Resp 20 | Ht 66.0 in | Wt 177.5 lb

## 2012-12-27 DIAGNOSIS — E785 Hyperlipidemia, unspecified: Secondary | ICD-10-CM | POA: Insufficient documentation

## 2012-12-27 DIAGNOSIS — Z9981 Dependence on supplemental oxygen: Secondary | ICD-10-CM

## 2012-12-27 DIAGNOSIS — B356 Tinea cruris: Secondary | ICD-10-CM

## 2012-12-27 DIAGNOSIS — E039 Hypothyroidism, unspecified: Secondary | ICD-10-CM | POA: Insufficient documentation

## 2012-12-27 DIAGNOSIS — D638 Anemia in other chronic diseases classified elsewhere: Secondary | ICD-10-CM | POA: Insufficient documentation

## 2012-12-27 DIAGNOSIS — I5030 Unspecified diastolic (congestive) heart failure: Secondary | ICD-10-CM | POA: Insufficient documentation

## 2012-12-27 DIAGNOSIS — D649 Anemia, unspecified: Secondary | ICD-10-CM

## 2012-12-27 DIAGNOSIS — R599 Enlarged lymph nodes, unspecified: Secondary | ICD-10-CM

## 2012-12-27 DIAGNOSIS — J4489 Other specified chronic obstructive pulmonary disease: Secondary | ICD-10-CM | POA: Insufficient documentation

## 2012-12-27 DIAGNOSIS — E119 Type 2 diabetes mellitus without complications: Secondary | ICD-10-CM | POA: Insufficient documentation

## 2012-12-27 DIAGNOSIS — I2510 Atherosclerotic heart disease of native coronary artery without angina pectoris: Secondary | ICD-10-CM | POA: Insufficient documentation

## 2012-12-27 DIAGNOSIS — E538 Deficiency of other specified B group vitamins: Secondary | ICD-10-CM

## 2012-12-27 DIAGNOSIS — I509 Heart failure, unspecified: Secondary | ICD-10-CM | POA: Insufficient documentation

## 2012-12-27 DIAGNOSIS — J449 Chronic obstructive pulmonary disease, unspecified: Secondary | ICD-10-CM

## 2012-12-27 DIAGNOSIS — R1900 Intra-abdominal and pelvic swelling, mass and lump, unspecified site: Secondary | ICD-10-CM

## 2012-12-27 DIAGNOSIS — R591 Generalized enlarged lymph nodes: Secondary | ICD-10-CM

## 2012-12-27 LAB — CBC WITH DIFFERENTIAL/PLATELET
Eosinophils Relative: 3 % (ref 0–5)
HCT: 26.4 % — ABNORMAL LOW (ref 36.0–46.0)
Hemoglobin: 8.1 g/dL — ABNORMAL LOW (ref 12.0–15.0)
Lymphocytes Relative: 24 % (ref 12–46)
Lymphs Abs: 1.4 10*3/uL (ref 0.7–4.0)
MCV: 102.3 fL — ABNORMAL HIGH (ref 78.0–100.0)
Monocytes Absolute: 0.5 10*3/uL (ref 0.1–1.0)
Neutro Abs: 3.7 10*3/uL (ref 1.7–7.7)
RBC: 2.58 MIL/uL — ABNORMAL LOW (ref 3.87–5.11)
WBC: 5.8 10*3/uL (ref 4.0–10.5)

## 2012-12-27 LAB — COMPREHENSIVE METABOLIC PANEL
ALT: 8 U/L (ref 0–35)
CO2: 37 mEq/L — ABNORMAL HIGH (ref 19–32)
Calcium: 9.1 mg/dL (ref 8.4–10.5)
Chloride: 103 mEq/L (ref 96–112)
Creatinine, Ser: 1.07 mg/dL (ref 0.50–1.10)
GFR calc Af Amer: 61 mL/min — ABNORMAL LOW (ref >90)
GFR calc non Af Amer: 53 mL/min — ABNORMAL LOW (ref >90)
Glucose, Bld: 92 mg/dL (ref 70–99)
Total Bilirubin: 0.3 mg/dL (ref 0.3–1.2)

## 2012-12-27 MED ORDER — CLOTRIMAZOLE 1 % EX CREA
TOPICAL_CREAM | Freq: Two times a day (BID) | CUTANEOUS | Status: DC
  Filled 2012-12-27: qty 15

## 2012-12-27 NOTE — Patient Instructions (Addendum)
[facility] Discharge Instructions  RECOMMENDATIONS MADE BY THE CONSULTANT AND ANY TEST RESULTS WILL BE SENT TO YOUR REFERRING PHYSICIAN.  EXAM FINDINGS BY THE PHYSICIAN TODAY AND SIGNS OR SYMPTOMS TO REPORT TO CLINIC OR PRIMARY PHYSICIAN: Exam and discussion by MD.  Need to do scan of your chest to see if there is anything else going on.  To find out what the enlarged lymph nodes are  we would need to do a biopsy.  We will make a referral to Interventional Radiology for this to be done. MEDICATIONS PRESCRIBED:  none    SPECIAL INSTRUCTIONS/FOLLOW-UP: CT scan of your chest, then CT guided biopsy the return here for follow-up.  Thank you for choosing [facility] to provide your oncology and hematology care.  To afford each patient quality time with our providers, please arrive at least 15 minutes before your scheduled appointment time.  With your help, our goal is to use those 15 minutes to complete the necessary work-up to ensure our physicians have the information they need to help with your evaluation and healthcare recommendations.    Effective January 1st, 2014, we ask that you re-schedule your appointment with our physicians should you arrive 10 or more minutes late for your appointment.  We strive to give you quality time with our providers, and arriving late affects you and other patients whose appointments are after yours.    Again, thank you for choosing [facility].  Our hope is that these requests will decrease the amount of time that you wait before being seen by our physicians.       _____________________________________________________________  Should you have questions after your visit to [facility], please contact our office at [phone] between the hours of 8:30 a.m. and 5:00 p.m.  Voicemails left after 4:30 p.m. will not be returned until the following business day.  For prescription refill requests, have your  pharmacy contact our office with your prescription refill request.

## 2012-12-27 NOTE — Progress Notes (Signed)
Kara [name] presented for labwork. Labs per MD order drawn via Peripheral Line 23 gauge needle inserted in left AC  Good blood return present. Procedure without incident.  Needle removed intact. Patient tolerated procedure well.

## 2012-12-27 NOTE — Progress Notes (Addendum)
Patient History and Physical   Kara [name] [id number] 07-08-1946 67 y.o. 12/27/2012   Chief Complaint: Lymphadenopathy.   HPI:  Kara [name] is a 67 year old woman with history of COPD oxygen dependent on continuous O2 of about 3 L per minute.  She has a protracted history of anemia which was thought to be related to anemia of chronic disease and was being followed by Dr. [name]. Her last follow up visit was in December of 2013.  I have reviewed her previous records in the electronic health care system her hemoglobin has ranged from low of 7.5 in September of 2012 to high of 11.4 in May of 2009.  Her most recent  CBC was in December and that time hemoglobin was 8.4 g/dL.Patient tells me also that she had tried the ESA therapy in the past without much response.  She reports that she was recently seen by Dr. Karilyn Cota who did a dilatation of the esophagus for her .  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  Additionally there was right hydronephrosis with an enlarged lymph nodes measuring 2.6 x 1.9 x 3.2 cm which appears to be new, at least compared to imaging done in 2010. Patient has been sent for further evaluation of this.  She complaints of occasional  night sweats going on for 2 years and she has declined  a bone marrow aspiration and biopsy without conscious sedation.  According to her records there are concerns about putting through sedation given her COPD and breathing problems.  Etiology of her anemia has been relatively unclear. When I reviewed how weight in electronic record her weight is being essentially stable without any significant loss since 2012. She continues to feel weak and says that she sleeps very often and her breathing has been worse in the last couple of months.  This was confirmed by  her husband.  She denies fever, new cough or any peripheral lymphadenopathy.  She is accompanied by her husband.   Past Medical History  Diagnosis Date  . Type 2 diabetes mellitus   . Arthritis   . COPD (chronic obstructive pulmonary disease)     Home oxygen  . Palpitations     Reports history of atrial fibrillation, although none seen recently  . Coronary atherosclerosis of native coronary artery     Nonobstructive at catheterization 2008  . GERD (gastroesophageal reflux disease)   . Irritable bowel syndrome   . Chronic anemia   . Hypothyroidism   . Hyperlipidemia   . Chronic edema   . Pneumonia   . Diabetes mellitus   . Hypertension   . Chronic pain   . Anemia 08/04/2011  . Renal insufficiency 08/07/2011  . Anemia of chronic disease 08/04/2011  . Bulging lumbar disc     3    Past Surgical History  Procedure Laterality Date  . Cholecystectomy      Allergies: Allergies  Allergen Reactions  . Codeine Nausea And Vomiting and Rash    Medications: Current outpatient prescriptions:acetaminophen (TYLENOL) 325 MG tablet, Take 650 mg by mouth every 6 (six) hours as needed.  , Disp: , Rfl: ;  ALPRAZolam (XANAX) 1 MG tablet, Take 1 mg by mouth 3 (three) times daily., Disp: , Rfl: ;  citalopram (CELEXA) 20 MG tablet, Take 20 mg by  mouth 2 (two) times daily. , Disp: , Rfl: ;  fenofibrate 160 MG tablet, Take 160 mg by mouth at bedtime., Disp: , Rfl:  furosemide (LASIX) 20 MG tablet, Take 80 mg by mouth. Taking 80 mg in the am and 80 mg in the pm, Disp: , Rfl: ;  levothyroxine (SYNTHROID, LEVOTHROID) 100 MCG tablet, Take 100 mcg by mouth daily.  , Disp: , Rfl: ;  metFORMIN (GLUCOPHAGE) 1000 MG tablet, Take 1,000 mg by mouth 2 (two) times daily with a meal. , Disp: , Rfl: ;  metoprolol tartrate (LOPRESSOR) 25 MG tablet, Take 25 mg by mouth 3 (three) times daily. , Disp: , Rfl:  morphine (MS CONTIN) 30 MG 12 hr tablet, Take 30 mg by mouth 3 (three) times daily. To treat pain associated  with 3 bulging disc in back, Disp: , Rfl: ;  morphine (MSIR) 15 MG tablet, Take 15 mg by mouth every 4 (four) hours as needed for pain., Disp: , Rfl: ;  Nebulizer MISC, by Does not apply route as directed.  , Disp: , Rfl:  nitroGLYCERIN (NITROSTAT) 0.4 MG SL tablet, Place 0.4 mg under the tongue every 5 (five) minutes as needed.  , Disp: , Rfl: ;  pantoprazole (PROTONIX) 40 MG tablet, Take 40 mg by mouth 2 (two) times daily., Disp: , Rfl: ;  polyethylene glycol (MIRALAX / GLYCOLAX) packet, Take 17 g by mouth 2 (two) times daily. , Disp: , Rfl: ;  potassium chloride (K-DUR) 10 MEQ tablet, Take 20 mEq by mouth 2 (two) times daily. , Disp: , Rfl:  predniSONE (STERAPRED UNI-PAK) 10 MG tablet, Take 10 mg by mouth. Patient states that Dr.Hawkins put her on this last week, Disp: , Rfl:    Social History:  She has about 48 pack years of smoking quit in 1997. She is married and lives with spouse.  Family History: Family History  Problem Relation Age of Onset  . Hypertension      Review of Systems: 14 point review of system is as in the history above otherwise negative.   Physical Exam: Blood pressure 129/55, pulse 66, temperature 97.7 F (36.5 C), temperature source Oral, resp. rate 20, height 5\' 6"  (1.676 m), weight 177 lb 8 oz (80.513 kg). GENERAL: In No acute distress, has nasal oxygen cannula in place. SKIN: few bruises on the lower extremities.she has erythema plus at rash weight foul odor involving the right groin consistent with tinea cruris HEAD: Normocephalic, No masses, lesions, tenderness or abnormalities  EYES: Conjunctiva sincerely pale without icterus. ENT: External ears normal ,lips, buccal mucosa, and tongue normal and mucous membranes are moist and edentulous. LYMPH: No palpable lymphadenopathy, in day sure, an ache, supraclavicular, axillary, and inguinal lymph node areas.  BREAST:Normal without mass, skin or nipple changes or axillary nodes,  LUNGS: markedly decreased breath  sounds bilaterally and no crackles. HEART: regular rate & rhythm, no murmurs, no gallops, S1 normal and S2 normal  ABDOMEN: Abdomen soft, non-tender, normal bowel sounds, no masses or organomegaly and no hepatosplenomegaly palpable . MSK: No CVA tenderness and no tenderness on percussion of the back or rib cage. EXTREMITIES: No edema, no skin discoloration or tenderness NEURO: Alert & oriented , no focal motor/sensory deficits.     Lab Results: Lab Results  Component Value Date   WBC 6.4 07/09/2012   HGB 8.4* 07/09/2012   HCT 27.5* 07/09/2012   MCV 95.5 07/09/2012   PLT 228 07/09/2012     Chemistry  Component Value Date/Time   NA 140 02/21/2012 1301   K 3.8 02/21/2012 1301   CL 92* 02/21/2012 1301   CO2 42* 02/21/2012 1301   BUN 23 02/21/2012 1301   CREATININE 1.22* 02/21/2012 1301      Component Value Date/Time   CALCIUM 9.7 02/21/2012 1301   ALKPHOS 74 02/21/2012 1301   AST 17 02/21/2012 1301   ALT 5 02/21/2012 1301   BILITOT 0.3 02/21/2012 1301         Radiological Studies: Ct Abdomen Pelvis W Contrast  12/05/2012   *RADIOLOGY REPORT*  Clinical Data: Generalized abdominal pain for couple months, longstanding constipation, history diabetes, COPD, coronary artery disease, hypertension  CT ABDOMEN AND PELVIS WITH CONTRAST  Technique:  Multidetector CT imaging of the abdomen and pelvis was performed following the standard protocol during bolus administration of intravenous contrast. Sagittal and coronal MPR images reconstructed from axial data set.  Contrast: OMNIPAQUE IOHEXOL 300 MG/ML  SOLN Dilute oral contrast.  Comparison: 02/09/2009  Findings: Lung bases clear. Paraspinal soft tissue at the medial right lung base is incompletely visualized versus previous study but persists, largest focal area 2.2 x 1.7 cm. Right hydronephrosis and proximal hydroureter with ureteral dilatation terminating at the level of the right common iliac artery where an enlarged lymph node is  identified 2.6 x 1.9 x 3.2 cm image 47. Tiny cyst at inferior pole right kidney. Interval decrease in size of the right kidney now 8.6 cm length, previously 10.3 cm, with mild cortical thinning.  Gallbladder surgically absent. Liver, spleen, pancreas, left kidney, and adrenal glands normal appearance. Stomach collapsed, unable to accurately assess gastric wall thickness, which appears slightly prominent. Sigmoid diverticulosis without evidence of diverticulitis. Remaining bowel loops normal appearance.  Appendix not definitely visualized, with no pericecal inflammatory process seen. Scattered atherosclerotic calcifications. No mass, additional adenopathy, free fluid or inflammatory process. No acute osseous findings.  IMPRESSION: New right hydronephrosis and proximal hydroureter secondary to obstruction of the mid right ureter at an enlarged lymph node associated with the right common iliac artery, 2.6 x 1.9 x 3.2 cm; tumor and lymphoma not excluded. Interval right renal atrophy versus prior study. Persistent visualization of abnormal right paraspinal soft tissue at the right lung base, incompletely imaged on this study. Sigmoid diverticulosis.   Original Report Authenticated By: Ulyses Southward, M.D.      Impression: Kara [name], Kara [name] a 67 year old woman with term protracted COPD oxygen dependent and former smoker.  She has been known to have him right paraspinal mass dating back at least 2006.  It's not clear what this paraspinal mass is and seems to have probably shrunk in size over time.  Concern is the new suspected enlarged abdominal lymph nodes related to the right ureter which appears to be causing hydronephrosis.  This may be particularly challenging for biopsy given the location.  There is no history of provided by patient of any previous biopsy .The etiology of her anemia is also unclear and the light of this new lymphadenopathy it is not unreasonable to think about lymphoid hematologic malignancy which can  account for the lymphadenopathy and anemia.  However given the extensive history of smoking other malignancies related to smoking  are not reasonable to consider.  She does have what seems to be tinea cruris involving mostly the right groin which is long-standing probably related to hygiene.  Recommendations: 1.  I sent the patient for CT-guided biopsy of a right abdominal mass. 2.  I ordered sample for CBC, CMP, LDH,  iron studies, B12 and folic acid. 3.  I ordered a CT of the chest to complete imaging. 4.  I prescribed clotrimazole 4 tinea cruris and instructed her to continue to application one week after the lesions are resolved. 5.  She'll return to clinic in 2 weeks to discuss the result and will call/treatment options. 6.  I informed her that if her hemoglobin is critically low today, that I may recommend blood transfusion and she agrees with this.  I had a very long discussion with her and we discussed the differential diagnosis of lymphadenopathy( infection, inflammation and malignancy ) she had multiple questions which we are satisfactorily answered.She knows to call if she has any new concern.  I spent 25 minutes counseling the patient face to face. The total time spent in the appointment was 60 minutes.   Sherral Hammers, MD FACP. Hematology/Oncology.     . Addendum: I spoke with interventional radiology today 12/30/12,The MD  had called me on the patient, he tells me that he will not able to biopsy the lesion due to the location of the lesion.  He suggested PET CT scan or bone marrow biopsy (we have discussed bone marrow aspiration and biopsy and aspirate patient has consistently refused.  We also discussed it at the last visit.) I have gone ahead and ordered a PET/CT scan.  We'll see if this shows other lesions that may be more amenable to biopsy.I will d/c the CT of the chest previously ordered.

## 2012-12-27 NOTE — Telephone Encounter (Signed)
Message left for patient to get Clotrimazole 1% cream OTC from her pharmacy and apply to area twice daily.  To call office if any questions.

## 2012-12-28 LAB — IRON AND TIBC
Iron: 70 ug/dL (ref 42–135)
TIBC: 386 ug/dL (ref 250–470)

## 2012-12-28 LAB — FERRITIN: Ferritin: 433 ng/mL — ABNORMAL HIGH (ref 10–291)

## 2012-12-28 LAB — VITAMIN B12: Vitamin B-12: 242 pg/mL (ref 211–911)

## 2012-12-30 ENCOUNTER — Telehealth (INDEPENDENT_AMBULATORY_CARE_PROVIDER_SITE_OTHER): Payer: Self-pay | Admitting: *Deleted

## 2012-12-30 MED ORDER — VITAMIN B-12 1000 MCG PO TABS: 1000.0000 ug | ORAL_TABLET | Freq: Every day | ORAL | Status: DC

## 2012-12-30 NOTE — Telephone Encounter (Signed)
Kara [name] bowel movement is only coming out the left side with pain. Would like to speak with Dr. Karilyn Cota if at all possible. Her return phone number is [phone].

## 2012-12-30 NOTE — Addendum Note (Signed)
Addended by: Sherral Hammers on: 12/30/2012 04:43 PM   Modules accepted: Orders

## 2012-12-30 NOTE — Telephone Encounter (Signed)
Patient called and she states that last Thursday she started getting constipated and then on Friday she used enema with some results. She is having pain on her left side (Abd) She has been taking Miralax 1-2 times daily Patient states that she saw Dr.Niesjstrom on Friday Per Dr.Rehman the patient may be having spasms, patient to use a fleet enema on Tuesday and Wednesday night and she should take the Miralax twice a day. Patient was made aware

## 2012-12-30 NOTE — Addendum Note (Signed)
Addended by: Sherral Hammers on: 12/30/2012 08:29 AM   Modules accepted: Orders

## 2012-12-31 ENCOUNTER — Other Ambulatory Visit (HOSPITAL_COMMUNITY): Payer: Self-pay

## 2012-12-31 NOTE — Addendum Note (Signed)
Addended by: Sherral Hammers on: 12/31/2012 08:57 AM   Modules accepted: Orders

## 2013-01-01 ENCOUNTER — Telehealth (HOSPITAL_COMMUNITY): Payer: Self-pay

## 2013-01-01 ENCOUNTER — Ambulatory Visit (HOSPITAL_COMMUNITY): Payer: Medicare Other

## 2013-01-01 NOTE — Telephone Encounter (Signed)
Confirmed with [name] her appointment for PET scan on 6/13.  Verbalized understanding of instructions regarding scan.

## 2013-01-03 ENCOUNTER — Encounter (HOSPITAL_COMMUNITY)
Admission: RE | Admit: 2013-01-03 | Discharge: 2013-01-03 | Disposition: A | Discharge status: Home / Self Care | Payer: Medicare Other | Source: Ambulatory Visit | Attending: Hematology and Oncology | Admitting: Hematology and Oncology

## 2013-01-03 ENCOUNTER — Encounter (HOSPITAL_COMMUNITY): Payer: Self-pay

## 2013-01-03 DIAGNOSIS — N269 Renal sclerosis, unspecified: Secondary | ICD-10-CM | POA: Insufficient documentation

## 2013-01-03 DIAGNOSIS — I7789 Other specified disorders of arteries and arterioles: Secondary | ICD-10-CM | POA: Insufficient documentation

## 2013-01-03 DIAGNOSIS — J438 Other emphysema: Secondary | ICD-10-CM | POA: Insufficient documentation

## 2013-01-03 DIAGNOSIS — Z9089 Acquired absence of other organs: Secondary | ICD-10-CM | POA: Insufficient documentation

## 2013-01-03 DIAGNOSIS — R591 Generalized enlarged lymph nodes: Secondary | ICD-10-CM

## 2013-01-03 DIAGNOSIS — M799 Soft tissue disorder, unspecified: Secondary | ICD-10-CM | POA: Insufficient documentation

## 2013-01-03 DIAGNOSIS — R599 Enlarged lymph nodes, unspecified: Secondary | ICD-10-CM | POA: Insufficient documentation

## 2013-01-03 DIAGNOSIS — I2510 Atherosclerotic heart disease of native coronary artery without angina pectoris: Secondary | ICD-10-CM | POA: Insufficient documentation

## 2013-01-03 DIAGNOSIS — I517 Cardiomegaly: Secondary | ICD-10-CM | POA: Insufficient documentation

## 2013-01-03 LAB — GLUCOSE, CAPILLARY: Glucose-Capillary: 99 mg/dL (ref 70–99)

## 2013-01-03 MED ORDER — FLUDEOXYGLUCOSE F - 18 (FDG) INJECTION
16.7000 | Freq: Once | INTRAVENOUS | Status: AC | PRN
  Administered 2013-01-03: 16.7 via INTRAVENOUS

## 2013-01-07 ENCOUNTER — Other Ambulatory Visit (HOSPITAL_COMMUNITY): Payer: Medicare Other

## 2013-01-07 ENCOUNTER — Ambulatory Visit (HOSPITAL_COMMUNITY): Payer: Medicare Other

## 2013-01-12 ENCOUNTER — Other Ambulatory Visit (INDEPENDENT_AMBULATORY_CARE_PROVIDER_SITE_OTHER): Payer: Self-pay | Admitting: Internal Medicine

## 2013-01-13 ENCOUNTER — Encounter (HOSPITAL_BASED_OUTPATIENT_CLINIC_OR_DEPARTMENT_OTHER): Payer: Medicare Other | Admitting: Oncology

## 2013-01-13 ENCOUNTER — Encounter (HOSPITAL_COMMUNITY): Payer: Self-pay | Admitting: Oncology

## 2013-01-13 VITALS — BP 96/58 | HR 61 | Temp 98.4°F | Resp 20 | Wt 176.1 lb

## 2013-01-13 DIAGNOSIS — E538 Deficiency of other specified B group vitamins: Secondary | ICD-10-CM

## 2013-01-13 DIAGNOSIS — D638 Anemia in other chronic diseases classified elsewhere: Secondary | ICD-10-CM

## 2013-01-13 LAB — CBC WITH DIFFERENTIAL/PLATELET
Basophils Absolute: 0 10*3/uL (ref 0.0–0.1)
Eosinophils Relative: 4 % (ref 0–5)
Lymphocytes Relative: 26 % (ref 12–46)
Lymphs Abs: 1.1 10*3/uL (ref 0.7–4.0)
MCV: 100.4 fL — ABNORMAL HIGH (ref 78.0–100.0)
Monocytes Relative: 8 % (ref 3–12)
Neutrophils Relative %: 61 % (ref 43–77)
Platelets: 109 10*3/uL — ABNORMAL LOW (ref 150–400)
RBC: 2.56 MIL/uL — ABNORMAL LOW (ref 3.87–5.11)
RDW: 13 % (ref 11.5–15.5)
Smear Review: DECREASED
WBC: 4.1 10*3/uL (ref 4.0–10.5)

## 2013-01-13 NOTE — Progress Notes (Signed)
#  1 anemia chronic disease though at times she has a macrocytic picture. Her recent B12 level was at the low end of normal, LDH was normal, folic acid levels unremarkable. #2 folic acid deficiency in the past #3 anemia of chronic disease also complicated rarely by iron deficiency many many years ago #4 sleep apnea with severe COPD on around-the-clock oxygen #5 diastolic CHF #6 hyperlipidemia #7 hypothyroidism #8 DJD #9 CAD #10 diabetes mellitus  This lady was told to take oral B12 by the physician covering for me. She does not have well-documented B12 deficiency but a low end of normal B12 level. We have had documentation of folic acid in the past and many years ago more than 10, we had iron deficiency in the past. She typically has a picture consistent with anemia of chronic disease presently but occasionally has a macrocytosis.  Therefore we'll check a methylmalonic acid level.  She also has recently had a PET scan which I went over with her and her husband which shows no evidence of cancer in the para-aortic chest lesion or abdominal lesion but it does show hypermetabolic activity in the anal area without a distinct structural abnormality. She refuses a anal exam a rectal exam by me but agrees to have one by Dr. Karilyn Cota.  I will send a message Dr. Karilyn Cota but in the meantime since she has responded in the past to Aranesp but doesn't get clinically better there is no point going back on erythropoietin-like agents.  I will check a methylmalonic acid level otherwise see her back in 3 months. She is somewhat take oral B12 if she does not actually have to. She never did start the medication as he mentioned to her.

## 2013-01-13 NOTE — Patient Instructions (Addendum)
[facility] Discharge Instructions  RECOMMENDATIONS MADE BY THE CONSULTANT AND ANY TEST RESULTS WILL BE SENT TO YOUR REFERRING PHYSICIAN.  EXAM FINDINGS BY THE PHYSICIAN TODAY AND SIGNS OR SYMPTOMS TO REPORT TO CLINIC OR PRIMARY PHYSICIAN: Discussion by MD.  Your PET scan showed an area of abnormality in your rectal area and Dr. [name] will contact Dr. Karilyn Cota to see if he will evaluate the area.  We will check some blood work today and will see if you are B12 deficient.  MEDICATIONS PRESCRIBED:  Take the Miralax twice daily.  Your bowels need to be move more than they are.  INSTRUCTIONS GIVEN AND DISCUSSED: Report increased fatigue, shortness of breath, etc.  SPECIAL INSTRUCTIONS/FOLLOW-UP: Follow-up in 3 months with blood work and exam.  Thank you for choosing [facility] to provide your oncology and hematology care.  To afford each patient quality time with our providers, please arrive at least 15 minutes before your scheduled appointment time.  With your help, our goal is to use those 15 minutes to complete the necessary work-up to ensure our physicians have the information they need to help with your evaluation and healthcare recommendations.    Effective January 1st, 2014, we ask that you re-schedule your appointment with our physicians should you arrive 10 or more minutes late for your appointment.  We strive to give you quality time with our providers, and arriving late affects you and other patients whose appointments are after yours.    Again, thank you for choosing [facility].  Our hope is that these requests will decrease the amount of time that you wait before being seen by our physicians.       _____________________________________________________________  Should you have questions after your visit to [facility], please contact our office at [phone] between the hours of 8:30 a.m. and 5:00 p.m.  Voicemails  left after 4:30 p.m. will not be returned until the following business day.  For prescription refill requests, have your pharmacy contact our office with your prescription refill request.

## 2013-01-13 NOTE — Progress Notes (Signed)
Kara [name] presented for labwork. Labs per MD order drawn via Peripheral Line 23 gauge needle inserted in right forearm  Good blood return present. Procedure without incident.  Needle removed intact. Patient tolerated procedure well.

## 2013-01-16 LAB — METHYLMALONIC ACID, SERUM: Methylmalonic Acid, Quantitative: 1.2 umol/L — ABNORMAL HIGH (ref <0.40)

## 2013-01-17 ENCOUNTER — Other Ambulatory Visit (HOSPITAL_COMMUNITY): Payer: Self-pay

## 2013-01-17 ENCOUNTER — Ambulatory Visit (HOSPITAL_COMMUNITY): Payer: Medicare Other | Admitting: Oncology

## 2013-01-17 DIAGNOSIS — E538 Deficiency of other specified B group vitamins: Secondary | ICD-10-CM

## 2013-01-17 DIAGNOSIS — D649 Anemia, unspecified: Secondary | ICD-10-CM

## 2013-01-29 ENCOUNTER — Ambulatory Visit (INDEPENDENT_AMBULATORY_CARE_PROVIDER_SITE_OTHER): Payer: Medicare Other | Admitting: Internal Medicine

## 2013-01-29 ENCOUNTER — Encounter (INDEPENDENT_AMBULATORY_CARE_PROVIDER_SITE_OTHER): Payer: Self-pay | Admitting: Internal Medicine

## 2013-01-29 VITALS — BP 124/68 | HR 74 | Temp 98.2°F | Resp 18 | Ht 66.0 in | Wt 170.8 lb

## 2013-01-29 DIAGNOSIS — R1084 Generalized abdominal pain: Secondary | ICD-10-CM

## 2013-01-29 DIAGNOSIS — R112 Nausea with vomiting, unspecified: Secondary | ICD-10-CM

## 2013-01-29 MED ORDER — HYOSCYAMINE SULFATE 0.125 MG SL SUBL: 0.1250 mg | SUBLINGUAL_TABLET | Freq: Three times a day (TID) | SUBLINGUAL | Status: DC | PRN

## 2013-01-29 NOTE — Patient Instructions (Signed)
Discontinue hyoscyamine if you have any side effects and let us know as well.

## 2013-01-29 NOTE — Progress Notes (Signed)
Presenting complaint;  Followup for nausea and vomiting.  Subjective:  Patient is 67 year old Caucasian female who is therefore scheduled visit. She had abdominopelvic CT 2 months ago for abdominal pain and nausea vomiting. An enlarged lymph node in the region of right common iliac artery impinging upon the ureter. Oncology consultation was obtained. She was felt to be high risk for CT-guided biopsy. She had a PET scan which did not reveal hypermetabolic activity in the area of interest. Increased activity noted involving anal canal. Today patient has more respiratory complaints. She recently developed severe cold and was seen by Dr. Renard Matter and treated with antibiotic. Now she complains of chest congestion. She is unable to cough up sputum. She denies fever or chills. She feels exhausted. She does not have a good appetite but she has not lost any weight. She has no sense of taste. She remains with intermittent nausea and sporadic vomiting. Her vomiting generally occurs when she wakes up from midday nap. She would like to try hyoscyamine which has helped in the past.   Current Medications: Current Outpatient Prescriptions  Medication Sig Dispense Refill  . acetaminophen (TYLENOL) 325 MG tablet Take 650 mg by mouth every 6 (six) hours as needed.        . ALPRAZolam (XANAX) 1 MG tablet Take 1 mg by mouth 3 (three) times daily.      . citalopram (CELEXA) 20 MG tablet Take 20 mg by mouth 2 (two) times daily.       . fenofibrate 160 MG tablet Take 160 mg by mouth at bedtime.      . furosemide (LASIX) 20 MG tablet Take 80 mg by mouth. Taking 80 mg in the am and 80 mg in the pm      . levothyroxine (SYNTHROID, LEVOTHROID) 100 MCG tablet Take 100 mcg by mouth daily.        . metFORMIN (GLUCOPHAGE) 1000 MG tablet Take 1,000 mg by mouth 2 (two) times daily with a meal.       . metoprolol tartrate (LOPRESSOR) 25 MG tablet Take 25 mg by mouth 3 (three) times daily.       [name] morphine (MS CONTIN) 30 MG 12 hr  tablet Take 30 mg by mouth 3 (three) times daily. To treat pain associated with 3 bulging disc in back      . morphine (MSIR) 15 MG tablet Take 15 mg by mouth every 4 (four) hours as needed for pain.      . Nebulizer MISC by Does not apply route as directed.        . nitroGLYCERIN (NITROSTAT) 0.4 MG SL tablet Place 0.4 mg under the tongue every 5 (five) minutes as needed.        . ondansetron (ZOFRAN) 4 MG tablet Take 4 mg by mouth every 8 (eight) hours as needed for nausea.      . pantoprazole (PROTONIX) 40 MG tablet Take 40 mg by mouth 2 (two) times daily.      . polyethylene glycol (MIRALAX / GLYCOLAX) packet Take 17 g by mouth 2 (two) times daily.       . potassium chloride (K-DUR) 10 MEQ tablet Take 20 mEq by mouth 2 (two) times daily.       . predniSONE (STERAPRED UNI-PAK) 10 MG tablet Take 10 mg by mouth. Patient states that Dr.Hawkins put her on this last week      . vitamin B-12 (CYANOCOBALAMIN) 1000 MCG tablet Take 1 tablet (1,000 mcg total) by mouth daily.  1000 tablet  3   No current facility-administered medications for this visit.     Objective: Blood pressure 124/68, pulse 74, temperature 98.2 F (36.8 C), temperature source Oral, resp. rate 18, height 5\' 6"  (1.676 m), weight 170 lb 12.8 oz (77.474 kg). Patient is alert and appears to be chronically ill but in no acute distress. She is using nasal O2. She felt too sick to undergo a rectal examination today     Assessment:  #1. Chronic nausea and vomiting also be multifactorial. #2. Chronic abdominal pain. #3. Increased uptake in anal region on PET scan would appear to be physiologic. Rectal examination was not performed today per patient request.   Plan:  Hyoscyamine sublingual 0.125 mg 3 times a day when necessary. Patient advised to followup with Dr. Renard Matter regarding ongoing respiratory symptoms.  Office visit in 3 months.

## 2013-01-30 ENCOUNTER — Other Ambulatory Visit (HOSPITAL_COMMUNITY): Payer: Self-pay | Admitting: Family Medicine

## 2013-01-30 ENCOUNTER — Ambulatory Visit (HOSPITAL_COMMUNITY)
Admission: RE | Admit: 2013-01-30 | Discharge: 2013-01-30 | Disposition: A | Discharge status: Home / Self Care | Payer: Medicare Other | Source: Ambulatory Visit | Attending: Family Medicine | Admitting: Family Medicine

## 2013-01-30 DIAGNOSIS — R0989 Other specified symptoms and signs involving the circulatory and respiratory systems: Secondary | ICD-10-CM | POA: Insufficient documentation

## 2013-01-30 DIAGNOSIS — R05 Cough: Secondary | ICD-10-CM

## 2013-01-30 DIAGNOSIS — R059 Cough, unspecified: Secondary | ICD-10-CM | POA: Insufficient documentation

## 2013-02-09 ENCOUNTER — Other Ambulatory Visit (INDEPENDENT_AMBULATORY_CARE_PROVIDER_SITE_OTHER): Payer: Self-pay | Admitting: Internal Medicine

## 2013-02-14 ENCOUNTER — Emergency Department (HOSPITAL_COMMUNITY)
Admission: EM | Admit: 2013-02-14 | Discharge: 2013-02-14 | Disposition: A | Discharge status: Home / Self Care | Payer: Medicare Other | Attending: Emergency Medicine | Admitting: Emergency Medicine

## 2013-02-14 ENCOUNTER — Encounter (HOSPITAL_COMMUNITY): Payer: Self-pay | Admitting: Emergency Medicine

## 2013-02-14 DIAGNOSIS — R609 Edema, unspecified: Secondary | ICD-10-CM | POA: Insufficient documentation

## 2013-02-14 DIAGNOSIS — S81812A Laceration without foreign body, left lower leg, initial encounter: Secondary | ICD-10-CM

## 2013-02-14 DIAGNOSIS — Y9289 Other specified places as the place of occurrence of the external cause: Secondary | ICD-10-CM | POA: Insufficient documentation

## 2013-02-14 DIAGNOSIS — Y9389 Activity, other specified: Secondary | ICD-10-CM | POA: Insufficient documentation

## 2013-02-14 DIAGNOSIS — I5030 Unspecified diastolic (congestive) heart failure: Secondary | ICD-10-CM | POA: Insufficient documentation

## 2013-02-14 DIAGNOSIS — S81009A Unspecified open wound, unspecified knee, initial encounter: Secondary | ICD-10-CM | POA: Insufficient documentation

## 2013-02-14 DIAGNOSIS — I10 Essential (primary) hypertension: Secondary | ICD-10-CM | POA: Insufficient documentation

## 2013-02-14 DIAGNOSIS — IMO0002 Reserved for concepts with insufficient information to code with codable children: Secondary | ICD-10-CM | POA: Insufficient documentation

## 2013-02-14 MED ORDER — BACITRACIN ZINC 500 UNIT/GM EX OINT
TOPICAL_OINTMENT | CUTANEOUS | Status: AC
  Administered 2013-02-14: 1
  Filled 2013-02-14: qty 0.9

## 2013-02-14 MED ORDER — LIDOCAINE HCL (PF) 1 % IJ SOLN
30.0000 mL | Freq: Once | INTRAMUSCULAR | Status: AC
  Administered 2013-02-14: 30 mL
  Filled 2013-02-14: qty 30

## 2013-02-14 MED ORDER — LIDOCAINE HCL (PF) 1 % IJ SOLN
INTRAMUSCULAR | Status: AC
  Filled 2013-02-14: qty 5

## 2013-02-14 NOTE — ED Notes (Signed)
Pt has laceration to lle from hitting it on car door.

## 2013-02-14 NOTE — ED Provider Notes (Addendum)
NOTE: this was a shared visit with Dr. Jodi Mourning. See his note for H&P. Procedure only by me.    LACERATION REPAIR Performed by: NEESE,HOPE Authorized by: NEESE,HOPE Consent: Verbal consent obtained. Risks and benefits: risks, benefits and alternatives were discussed Consent given by: patient Patient identity confirmed: provided demographic data Prepped and Draped in normal sterile fashion Wound explored  Laceration Location: left lower leg  Laceration Length: 4 cm  No Foreign Bodies seen or palpated  Anesthesia: local infiltration  Local anesthetic: lidocaine 2% without epinephrine  Anesthetic total: 3 ml  Irrigation method: syringe Amount of cleaning: standard  Skin closure: 5-0 prolene  Number of sutures: 11  Technique: interrupted  Patient tolerance: Patient tolerated the procedure well with no immediate complications.  Bacitracin ointment and pressure dressing.  [facility] Orlene Och, NP 02/14/13 1517 This was a shared visit with Dr. Lebron Quam Orlene Och, NP 02/21/13 [address] Orlene Och, NP 03/16/13 2153

## 2013-02-16 NOTE — ED Provider Notes (Addendum)
Medical screening examination/treatment/procedure(s) were conducted as a shared visit with non-physician practitioner(s) or resident  and myself.  I personally evaluated the patient during the encounter and agree with the findings and plan unless otherwise indicated.  67 yo female with chronic edema, htn, diastolic HF presents with 4 cm leg laceration to left LE after hitting it on a car door.  Tetanus UTD.  No other injuries.  Mild bleeding.  Nothing improves pain. No fevers, loc or numbness.   Nurses notes reviewed.   Wound care in ED.   Exam:  Well appearing, 4 cm linear laceration superficial to left aspect of LE, mild bleeding, nv intact distal, no bone tenderness pt is able to bear weight, RRR, mild tenderness at laceration site.  Laceration repair done by NP and I personally reviewed repair afterward. Well approximated, nv intact afterwards.  LACERATION REPAIR Performed by: NEESE,HOPE  Authorized by: NEESE,HOPE  Consent: Verbal consent obtained. Risks and benefits: risks, benefits and alternatives were discussed Consent given by: patient  Patient identity confirmed: provided demographic data  Prepped and Draped in normal sterile fashion  Wound explored  Laceration Location: left lower leg  Laceration Length: 4 cm  No Foreign Bodies seen or palpated  Anesthesia: local infiltration  Local anesthetic: lidocaine 2% without epinephrine  Anesthetic total: 3 ml  Irrigation method: syringe Amount of cleaning: standard  Skin closure: 5-0 prolene  Number of sutures: 11  Technique: interrupted  Patient tolerance: Patient tolerated the procedure well with no immediate complications.  Bacitracin ointment and pressure dressing.  Lyons, NP  02/14/13 1517  Enid Skeens, MD 02/16/13 1914  Enid Skeens, MD 03/01/13 1309  Enid Skeens, MD 03/19/13 [phone]

## 2013-02-18 ENCOUNTER — Other Ambulatory Visit (HOSPITAL_COMMUNITY): Payer: Medicare Other

## 2013-03-31 ENCOUNTER — Encounter (INDEPENDENT_AMBULATORY_CARE_PROVIDER_SITE_OTHER): Payer: Self-pay | Admitting: Internal Medicine

## 2013-03-31 ENCOUNTER — Ambulatory Visit (INDEPENDENT_AMBULATORY_CARE_PROVIDER_SITE_OTHER): Payer: Medicare Other | Admitting: Internal Medicine

## 2013-03-31 VITALS — BP 122/68 | HR 66 | Temp 97.2°F | Resp 18 | Ht 66.0 in | Wt 169.2 lb

## 2013-03-31 DIAGNOSIS — K5900 Constipation, unspecified: Secondary | ICD-10-CM

## 2013-03-31 DIAGNOSIS — R112 Nausea with vomiting, unspecified: Secondary | ICD-10-CM

## 2013-03-31 MED ORDER — LINACLOTIDE 145 MCG PO CAPS: 145.0000 ug | ORAL_CAPSULE | Freq: Every day | ORAL | Status: DC

## 2013-03-31 NOTE — Patient Instructions (Signed)
Call with progress report in 3-4 weeks. If you experience side effects with Linzess or Linaclotide can go back on MiraLax or polyethylene glycol. Can use hyoscyamine for left sided abdominal pain an as-needed basis

## 2013-03-31 NOTE — Progress Notes (Signed)
Presenting complaint;  Follow for nausea and vomiting. Patient complains of constipation.  Subjective:  Patient is 83 old Caucasian female who presents for scheduled visit. She was last seen 2 months ago. As for as her nausea and vomiting is concerned she feels much better. She has not experienced any episode of vomiting since her last visit but she still has periods of nausea which seemed to be alleviated with sublingual hyoscyamine. She complains of constipation. Last weekend she went 3 days without a bowel movement and eventually had relief yesterday. She complains of a soft lump in the anal area anteriorly when she strains. She denies melena or rectal bleeding. She complains of intermittent pain at [facility] which is worse when she is constipated and then eases following a bowel movement. Appetite is fair. Her weight has been stable since her last visit.  Current Medications: Current Outpatient Prescriptions  Medication Sig Dispense Refill  . acetaminophen (TYLENOL) 325 MG tablet Take 650 mg by mouth every 6 (six) hours as needed.        . ALPRAZolam (XANAX) 1 MG tablet Take 1 mg by mouth 3 (three) times daily.      . citalopram (CELEXA) 20 MG tablet Take 20 mg by mouth 2 (two) times daily.       . fenofibrate 160 MG tablet Take 160 mg by mouth at bedtime.      . furosemide (LASIX) 20 MG tablet Take 80 mg by mouth 2 (two) times daily.       . hyoscyamine (LEVSIN SL) 0.125 MG SL tablet Place 1 tablet (0.125 mg total) under the tongue 3 (three) times daily as needed for cramping.  60 tablet  1  . levothyroxine (SYNTHROID, LEVOTHROID) 100 MCG tablet Take 100 mcg by mouth daily.        . metFORMIN (GLUCOPHAGE) 1000 MG tablet Take 1,000 mg by mouth 2 (two) times daily with a meal.       . metoprolol tartrate (LOPRESSOR) 25 MG tablet Take 25 mg by mouth 3 (three) times daily.       [name] morphine (MS CONTIN) 30 MG 12 hr tablet Take 30 mg by mouth 2 (two) times daily. To treat pain associated with 3 bulging  disc in back      . Nebulizer MISC by Does not apply route as directed.        . nitroGLYCERIN (NITROSTAT) 0.4 MG SL tablet Place 0.4 mg under the tongue every 5 (five) minutes as needed for chest pain.       . pantoprazole (PROTONIX) 40 MG tablet Take 40 mg by mouth 2 (two) times daily.      . polyethylene glycol (MIRALAX / GLYCOLAX) packet Take 17 g by mouth 2 (two) times daily.       . Potassium Chloride Crys CR (KLOR-CON M20 PO) Take 20 mEq by mouth 2 (two) times daily.      . vitamin B-12 (CYANOCOBALAMIN) 1000 MCG tablet Take 1 tablet (1,000 mcg total) by mouth daily.  1000 tablet  3   No current facility-administered medications for this visit.     Objective: Blood pressure 122/68, pulse 66, temperature 97.2 F (36.2 C), temperature source Oral, resp. rate 18, height 5\' 6"  (1.676 m), weight 169 lb 3.2 oz (76.749 kg). Patient is alert and in no acute distress.  She is on nasal O2. Conjunctiva is pink. Sclera is nonicteric Oropharyngeal mucosa is normal. No neck masses or thyromegaly noted. Abdomen is symmetrical. Bowel sounds are normal.  Palpation reveals mild tenderness at LLQ but no guarding or rebound noted. No organomegaly or masses either. A rectal examination was limited to external inspection is she has soft contacts anteriorly.  Trace edema around ankles noted.   Assessment:  #1. Nausea and vomiting. She is doing better with when necessary use of hyoscyamine. This symptom curiously occurs when she wakes from afternoon nap. She has not vomited in the last 2 months. #2. Chronic constipation. It appears she had an episode of fecal impaction last weekend. Constipation is multifactorial. #3. LLQ abdominal pain appears to be secondary to an IBS.     Plan:  Discontinue polyethylene glycol. Linzess 145 mcg by mouth q. A.m. Can use hyoscyamine sublingual for LLQ abdominal pain as well on as needed basis. Office visit in 3 months.

## 2013-04-16 ENCOUNTER — Ambulatory Visit (HOSPITAL_COMMUNITY): Payer: Medicare Other

## 2013-04-16 ENCOUNTER — Ambulatory Visit (HOSPITAL_COMMUNITY): Payer: Medicare Other | Admitting: Oncology

## 2013-04-29 ENCOUNTER — Ambulatory Visit (INDEPENDENT_AMBULATORY_CARE_PROVIDER_SITE_OTHER): Payer: Medicare Other | Admitting: Internal Medicine

## 2013-04-29 NOTE — Progress Notes (Signed)
This encounter was created in error - please disregard.

## 2013-05-06 ENCOUNTER — Telehealth (INDEPENDENT_AMBULATORY_CARE_PROVIDER_SITE_OTHER): Payer: Self-pay | Admitting: *Deleted

## 2013-05-06 NOTE — Telephone Encounter (Signed)
Kara [name] doesn't feel like the Linzess 145 mcg is helping her a lot. She started the Linzess on 04/28/13 and before that she was taking [place] 17 grams in 8 oz of water two times daily. Her stools are getting hard. Kara [name] to continue taking her medication as directed until she hears back from Kara [name], Kara [name] or Kara [name]. The return phone number is [phone].

## 2013-05-06 NOTE — Telephone Encounter (Signed)
I will address this with Dr.Rehman this week as he returns from from out of town.

## 2013-05-08 NOTE — Telephone Encounter (Signed)
Per Dr. Karilyn Cota the patient may take 2 a day. The patient was called and made aware of this.

## 2013-05-12 ENCOUNTER — Other Ambulatory Visit (INDEPENDENT_AMBULATORY_CARE_PROVIDER_SITE_OTHER): Payer: Self-pay | Admitting: Internal Medicine

## 2013-05-15 ENCOUNTER — Telehealth (INDEPENDENT_AMBULATORY_CARE_PROVIDER_SITE_OTHER): Payer: Self-pay | Admitting: *Deleted

## 2013-05-15 NOTE — Telephone Encounter (Signed)
I have talked with patient. She states that she has been taking Linzess 145 mcg twice a day, her last BM was over 1 week ago , result was very little then. She is having to strain very hard , seen blood but not unusual for her she say. Severe headache since starting the Linzess. Severe cramping lower abd. Ask if there is something else or go back on what she was on?

## 2013-05-15 NOTE — Telephone Encounter (Signed)
The 2 daily of Linzess has not helped any. Her last BM has been over 1 week ago.  Her return phone number is [phone]. Would like for Tammy to give her a return call.

## 2013-05-18 NOTE — Telephone Encounter (Signed)
Patient can drop Linzess dose to 145 mcg daily. Can add OTC MiraLax Please call patient

## 2013-05-20 NOTE — Telephone Encounter (Signed)
Kaileah said she would try what Dr. Karilyn Cota recommended. Per Tammy, she can take with food.

## 2013-05-20 NOTE — Telephone Encounter (Signed)
Patient was called and given the instructions per Dr.Rehman.  She states that she is not going to take the Linzess , but has been taking 17 grams of Miralax twice a day. Since we talked she states that she had to use a enema to have a bowel movement.  Now, she is feeling the same way again as far as the need to go to the bathroom. Abdominal pain on both the right and left sides,she says that it is at the same location as her appendix on the right,same area on the left. This radiates across that lower area at the umbilicus. [phone] is the patient's phone number.

## 2013-05-20 NOTE — Telephone Encounter (Signed)
This note is forwarded to Dr. Karilyn Cota

## 2013-05-20 NOTE — Telephone Encounter (Signed)
Has been addressed. Please see Kara [name],s note.

## 2013-05-21 NOTE — Telephone Encounter (Signed)
Has already been addressed 

## 2013-06-05 ENCOUNTER — Other Ambulatory Visit (HOSPITAL_COMMUNITY): Payer: Self-pay | Admitting: Family Medicine

## 2013-06-05 ENCOUNTER — Ambulatory Visit (HOSPITAL_COMMUNITY)
Admission: RE | Admit: 2013-06-05 | Discharge: 2013-06-05 | Disposition: A | Discharge status: Home / Self Care | Payer: Medicare Other | Source: Ambulatory Visit | Attending: Family Medicine | Admitting: Family Medicine

## 2013-06-05 DIAGNOSIS — W19XXXA Unspecified fall, initial encounter: Secondary | ICD-10-CM | POA: Insufficient documentation

## 2013-06-05 DIAGNOSIS — S0990XA Unspecified injury of head, initial encounter: Secondary | ICD-10-CM

## 2013-06-05 DIAGNOSIS — R51 Headache: Secondary | ICD-10-CM | POA: Insufficient documentation

## 2013-06-08 NOTE — Progress Notes (Signed)
Kara Reichert, MD [address] [place] [address]  Anemia of chronic disease - Plan: Methylmalonic acid, serum, CBC with Differential, Reticulocytes, Ferritin, Vitamin B12, Intrinsic Factor Antibodies, Folate RBC, Lactate dehydrogenase, Direct antiglobulin test, Methylmalonic acid, serum  Chronic diastolic heart failure  COPD (chronic obstructive pulmonary disease)  Renal insufficiency  CURRENT THERAPY: Observation  INTERVAL HISTORY: Kara [name] 67 y.o. female returns for  regular  visit for followup of anemia chronic disease though at times she has a macrocytic picture. Her recent B12 level was at the low end of normal, LDH was normal, folic acid levels unremarkable.  AND folic acid deficiency in the past  AND anemia of chronic disease also complicated rarely by iron deficiency many many years ago  Kara [name] complains of chronic nausea and vomiting with constipation.  This is followed by Dr. Karilyn Cota.  I personally reviewed and went over laboratory results with the patient.  Most recent labs are out of date in [facility] from June, and therefore we will draw new labs today. She has responded in the past to Aranesp but doesn't get clinically better there is no point going back on erythropoietin-like agents.  The last injection of Aranesp was back in October 2013.   Recently, Kara [name] fell and reported to the ED.  She had a laceration which was nicely stitched and has healed nicely without any indication for infections. Additionally, in the ED, a CT of head was performed and was negative for any acute findings.   She was seen at her PCP's office and lab work performed about 2-3 weeks ago shows a Hgb of 8.0 g/dL with a normal WBC and platelet count.  I personally reviewed and went over laboratory results with the patient.  She is working with Dr. Modesta Messing for her back and they are working on decreasing her dependence on Morphine.    Hematologically, she denies any complaints and  ROS questioning is negative.   Past Medical History  Diagnosis Date  . Type 2 diabetes mellitus   . Arthritis   . COPD (chronic obstructive pulmonary disease)     Home oxygen  . Palpitations     Reports history of atrial fibrillation, although none seen recently  . Coronary atherosclerosis of native coronary artery     Nonobstructive at catheterization 2008  . GERD (gastroesophageal reflux disease)   . Irritable bowel syndrome   . Chronic anemia   . Hypothyroidism   . Hyperlipidemia   . Chronic edema   . Pneumonia   . Diabetes mellitus   . Hypertension   . Chronic pain   . Anemia 08/04/2011  . Renal insufficiency 08/07/2011  . Anemia of chronic disease 08/04/2011  . Bulging lumbar disc     3    has Lower extremity edema; COPD (chronic obstructive pulmonary disease); Essential hypertension, benign; Coronary atherosclerosis of native coronary artery; Hypothyroidism; Palpitations; Chronic diastolic heart failure; Anemia of chronic disease; Renal insufficiency; N&V (nausea and vomiting); GERD (gastroesophageal reflux disease); and Loss of weight on her problem list.     is allergic to codeine.  [name] does not currently have medications on file.  Past Surgical History  Procedure Laterality Date  . Cholecystectomy    . Appendectomy    . Foot surgery      Denies any headaches, dizziness, double vision, fevers, chills, night sweats, nausea, vomiting, diarrhea, constipation, chest pain, heart palpitations, shortness of breath, blood in stool, black tarry stool, urinary pain, urinary burning, urinary  frequency, hematuria.   PHYSICAL EXAMINATION  ECOG PERFORMANCE STATUS: 2 - Symptomatic, <50% confined to bed  Filed Vitals:   06/09/13 1300  BP: 116/65  Pulse: 66  Temp: 97.7 F (36.5 C)  Resp: 18    GENERAL:alert, comfortable, cooperative, chronically- ill looking  SKIN: skin color, texture, turgor are normal, no rashes or significant lesions HEAD: Normocephalic, No  masses, lesions, tenderness or abnormalities EYES: normal, PERRLA, EOMI, Conjunctiva are pink and non-injected EARS: External ears normal OROPHARYNX:mucous membranes are moist  NECK: supple, trachea midline LYMPH:  not examined BREAST:not examined LUNGS: clear to auscultation , severely decreased breath sounds HEART: regular rate & rhythm, no murmurs and no gallops ABDOMEN:abdomen soft, non-tender and normal bowel sounds BACK: Back symmetric, no curvature. EXTREMITIES:less then 2 second capillary refill, no joint deformities, effusion, or inflammation, no skin discoloration  NEURO: alert & oriented x 3 with fluent speech, no focal motor/sensory deficits   LABORATORY DATA: CBC    Component Value Date/Time   WBC 4.1 01/13/2013 1610   RBC 2.56* 01/13/2013 1610   RBC 2.75* 08/04/2011 1503   HGB 8.1* 01/13/2013 1610   HCT 25.7* 01/13/2013 1610   PLT 109* 01/13/2013 1610   MCV 100.4* 01/13/2013 1610   MCH 31.6 01/13/2013 1610   MCHC 31.5 01/13/2013 1610   RDW 13.0 01/13/2013 1610   LYMPHSABS 1.1 01/13/2013 1610   MONOABS 0.3 01/13/2013 1610   EOSABS 0.2 01/13/2013 1610   BASOSABS 0.0 01/13/2013 1610      Chemistry      Component Value Date/Time   NA 144 12/27/2012 1449   K 5.1 12/27/2012 1449   CL 103 12/27/2012 1449   CO2 37* 12/27/2012 1449   BUN 16 12/27/2012 1449   CREATININE 1.07 12/27/2012 1449      Component Value Date/Time   CALCIUM 9.1 12/27/2012 1449   ALKPHOS 56 12/27/2012 1449   AST 20 12/27/2012 1449   ALT 8 12/27/2012 1449   BILITOT 0.3 12/27/2012 1449        RADIOGRAPHIC STUDIES:  06/05/2013  CLINICAL DATA: 67 year old female with fall, head injury and  headache  EXAM:  CT HEAD WITHOUT CONTRAST  TECHNIQUE:  Contiguous axial images were obtained from the base of the skull  through the vertex without intravenous contrast.  COMPARISON: 10/08/2010 and prior head CT is  FINDINGS:  No acute intracranial abnormalities are identified, including mass  lesion or mass effect,  hydrocephalus, extra-axial fluid collection,  midline shift, hemorrhage, or acute infarction.  An enlarged perivascular space versus remote lacunar infarct in the  region of the right basal ganglia noted.  The visualized bony calvarium is unremarkable except for remote left  calvarial surgical changes.  IMPRESSION:  No evidence of acute intracranial abnormality.  Electronically Signed  By: Laveda Abbe M.D.  On: 06/05/2013 20:45    ASSESSMENT:  1. Anemia chronic disease though at times she has a macrocytic picture. Her recent B12 level was at the low end of normal, LDH was normal, folic acid levels unremarkable.  2. Folic acid deficiency in the past  3. Anemia of chronic disease also complicated rarely by iron deficiency many many years ago 4. Sleep apnea with severe COPD on around-the-clock oxygen  5. Diastolic CHF  6. Hyperlipidemia  7. Hypothyroidism  8. DJD  9. CAD  10. Diabetes mellitus  Patient Active Problem List   Diagnosis Date Noted  . Loss of weight 12/02/2012  . N&V (nausea and vomiting) 10/21/2012  . GERD (gastroesophageal reflux disease)  10/21/2012  . Renal insufficiency 08/07/2011  . Anemia of chronic disease 08/04/2011  . Chronic diastolic heart failure 07/10/2011  . Lower extremity edema 04/13/2011  . COPD (chronic obstructive pulmonary disease) 04/13/2011  . Essential hypertension, benign 04/13/2011  . Coronary atherosclerosis of native coronary artery 04/13/2011  . Hypothyroidism 04/13/2011  . Palpitations 04/13/2011     PLAN:  1. I personally reviewed and went over laboratory results with the patient. 2. I personally reviewed and went over radiographic studies with the patient. 3. Labs today: CBC diff, Reticulocytes, Ferritin, Vit B12, Intrinsic antibodies, Folate RBC, LDH, DAT, methylamalonic acid. 4. Return in 3 months for follow-up    THERAPY PLAN:  We will perform lab work today on [place] and we will follow-up with her via telephone regarding the  results.  She, thus far, displays an anemia of chronic disease picture.  We will see her back in 3 months for follow-up.  All questions were answered. The patient knows to call the clinic with any problems, questions or concerns. We can certainly see the patient much sooner if necessary.  Patient and plan discussed with Dr. Annamarie Dawley and she is in agreement with the aforementioned.   Payne Garske

## 2013-06-09 ENCOUNTER — Encounter (HOSPITAL_COMMUNITY): Payer: Medicare Other | Attending: Oncology | Admitting: Oncology

## 2013-06-09 ENCOUNTER — Encounter (HOSPITAL_COMMUNITY): Payer: Self-pay | Admitting: Oncology

## 2013-06-09 VITALS — BP 116/65 | HR 66 | Temp 97.7°F | Resp 18 | Wt 163.5 lb

## 2013-06-09 DIAGNOSIS — I5032 Chronic diastolic (congestive) heart failure: Secondary | ICD-10-CM | POA: Insufficient documentation

## 2013-06-09 DIAGNOSIS — D638 Anemia in other chronic diseases classified elsewhere: Secondary | ICD-10-CM

## 2013-06-09 DIAGNOSIS — N289 Disorder of kidney and ureter, unspecified: Secondary | ICD-10-CM | POA: Insufficient documentation

## 2013-06-09 DIAGNOSIS — J449 Chronic obstructive pulmonary disease, unspecified: Secondary | ICD-10-CM

## 2013-06-09 DIAGNOSIS — J4489 Other specified chronic obstructive pulmonary disease: Secondary | ICD-10-CM

## 2013-06-09 LAB — CBC WITH DIFFERENTIAL/PLATELET
Basophils Relative: 0 % (ref 0–1)
HCT: 27.4 % — ABNORMAL LOW (ref 36.0–46.0)
Hemoglobin: 8.4 g/dL — ABNORMAL LOW (ref 12.0–15.0)
Lymphs Abs: 1.8 10*3/uL (ref 0.7–4.0)
MCH: 30.2 pg (ref 26.0–34.0)
MCHC: 30.7 g/dL (ref 30.0–36.0)
Monocytes Absolute: 0.8 10*3/uL (ref 0.1–1.0)
Monocytes Relative: 11 % (ref 3–12)
Neutro Abs: 4.3 10*3/uL (ref 1.7–7.7)
RBC: 2.78 MIL/uL — ABNORMAL LOW (ref 3.87–5.11)

## 2013-06-09 LAB — RETICULOCYTES
RBC.: 2.78 MIL/uL — ABNORMAL LOW (ref 3.87–5.11)
Retic Count, Absolute: 50 10*3/uL (ref 19.0–186.0)

## 2013-06-09 NOTE — Patient Instructions (Signed)
[facility] Discharge Instructions  RECOMMENDATIONS MADE BY THE CONSULTANT AND ANY TEST RESULTS WILL BE SENT TO YOUR REFERRING PHYSICIAN.  Lab work today. We will call you if there are any abnormal/unexpected results. Return to clinic in 3 months to see MD.  Thank you for choosing [facility] to provide your oncology and hematology care.  To afford each patient quality time with our providers, please arrive at least 15 minutes before your scheduled appointment time.  With your help, our goal is to use those 15 minutes to complete the necessary work-up to ensure our physicians have the information they need to help with your evaluation and healthcare recommendations.    Effective January 1st, 2014, we ask that you re-schedule your appointment with our physicians should you arrive 10 or more minutes late for your appointment.  We strive to give you quality time with our providers, and arriving late affects you and other patients whose appointments are after yours.    Again, thank you for choosing [facility].  Our hope is that these requests will decrease the amount of time that you wait before being seen by our physicians.       _____________________________________________________________  Should you have questions after your visit to [facility], please contact our office at [phone] between the hours of 8:30 a.m. and 5:00 p.m.  Voicemails left after 4:30 p.m. will not be returned until the following business day.  For prescription refill requests, have your pharmacy contact our office with your prescription refill request.

## 2013-06-09 NOTE — Progress Notes (Signed)
Kara [name] presented for labwork. Labs per MD order drawn via Peripheral Line 23 gauge needle inserted in left antecubital.  Good blood return present. Procedure without incident.  Needle removed intact. Patient tolerated procedure well.

## 2013-06-10 LAB — INTRINSIC FACTOR ANTIBODIES: Intrinsic Factor: NEGATIVE

## 2013-06-10 LAB — VITAMIN B12: Vitamin B-12: 666 pg/mL (ref 211–911)

## 2013-06-10 LAB — DIRECT ANTIGLOBULIN TEST (NOT AT ARMC): DAT, IgG: NEGATIVE

## 2013-06-10 LAB — FOLATE RBC: RBC Folate: 986 ng/mL — ABNORMAL HIGH (ref >280)

## 2013-06-30 ENCOUNTER — Telehealth (HOSPITAL_COMMUNITY): Payer: Self-pay | Admitting: *Deleted

## 2013-06-30 NOTE — Telephone Encounter (Signed)
Patient would like for you to call her re: your last conversation about thyroid meds. Call her after 2pm, she is going back to bed now.

## 2013-07-01 ENCOUNTER — Ambulatory Visit (INDEPENDENT_AMBULATORY_CARE_PROVIDER_SITE_OTHER): Payer: Medicare Other | Admitting: Internal Medicine

## 2013-07-29 ENCOUNTER — Encounter: Payer: Self-pay | Admitting: Neurology

## 2013-07-29 ENCOUNTER — Encounter (HOSPITAL_COMMUNITY)
Admission: RE | Admit: 2013-07-29 | Discharge: 2013-07-29 | Disposition: A | Discharge status: Home / Self Care | Payer: Medicare Other | Source: Ambulatory Visit | Attending: Family Medicine | Admitting: Family Medicine

## 2013-07-29 DIAGNOSIS — D638 Anemia in other chronic diseases classified elsewhere: Secondary | ICD-10-CM | POA: Insufficient documentation

## 2013-07-29 LAB — PREPARE RBC (CROSSMATCH)

## 2013-07-29 LAB — HEMOGLOBIN AND HEMATOCRIT, BLOOD
HCT: 23.2 % — ABNORMAL LOW (ref 36.0–46.0)
HEMOGLOBIN: 6.9 g/dL — AB (ref 12.0–15.0)

## 2013-07-29 MED ORDER — SODIUM CHLORIDE 0.9 % IV SOLN
INTRAVENOUS | Status: DC
  Administered 2013-07-29: 08:00:00 via INTRAVENOUS

## 2013-07-30 ENCOUNTER — Ambulatory Visit (INDEPENDENT_AMBULATORY_CARE_PROVIDER_SITE_OTHER): Payer: Medicare Other | Admitting: Neurology

## 2013-07-30 ENCOUNTER — Encounter: Payer: Self-pay | Admitting: Neurology

## 2013-07-30 ENCOUNTER — Encounter (INDEPENDENT_AMBULATORY_CARE_PROVIDER_SITE_OTHER): Payer: Self-pay

## 2013-07-30 VITALS — BP 128/56 | HR 77 | Ht 66.0 in | Wt 168.0 lb

## 2013-07-30 DIAGNOSIS — R51 Headache: Secondary | ICD-10-CM

## 2013-07-30 DIAGNOSIS — R519 Headache, unspecified: Secondary | ICD-10-CM | POA: Insufficient documentation

## 2013-07-30 DIAGNOSIS — S139XXA Sprain of joints and ligaments of unspecified parts of neck, initial encounter: Secondary | ICD-10-CM

## 2013-07-30 DIAGNOSIS — S060X0A Concussion without loss of consciousness, initial encounter: Secondary | ICD-10-CM | POA: Insufficient documentation

## 2013-07-30 LAB — TYPE AND SCREEN
ABO/RH(D): O NEG
Antibody Screen: NEGATIVE
UNIT DIVISION: 0
Unit division: 0

## 2013-07-30 MED ORDER — CYCLOBENZAPRINE HCL 5 MG PO TABS: 5.0000 mg | ORAL_TABLET | Freq: Three times a day (TID) | ORAL | Status: DC

## 2013-07-30 NOTE — Progress Notes (Signed)
Reason for visit: Headache  Kara [name] is a 68 y.o. female  History of present illness:  Kara [name] is a 68 year old right-handed white female with a history of a fall that occurred around 05/27/2013. The patient was at home, and she fell in the bathroom, and struck the right frontotemporal region of the head. The patient denies loss of consciousness, but she has had daily headaches since that time. The patient was having some headaches before the fall, usually occurring once every 6-8 weeks. The patient now has generalized headaches associated with sharp constant pain. The patient has also had some increase in neck stiffness and shoulder discomfort following the fall. The patient reports some difficulty with dizziness, decreased concentration following the event. The patient has scalp tenderness with the headache. The patient takes daily opiate medications for her low back that includes MS Contin and she takes oxycodone tablets as well if needed. The patient indicates that this does not help her headache. The patient is not sleeping well because of the headache. The headaches are better in the morning, worse as the day goes on. The patient is also taking ultram. The patient has some gait instability associated with the dizziness. The patient has had any further falls. The patient reports no new numbness or weakness on the arms or legs. The patient is sent to this office for further evaluation. A CT scan of brain was done in November 2014 and was unremarkable.  Past Medical History  Diagnosis Date  . Type 2 diabetes mellitus   . Arthritis   . COPD (chronic obstructive pulmonary disease)     Home oxygen  . Palpitations     Reports history of atrial fibrillation, although none seen recently  . Coronary atherosclerosis of native coronary artery     Nonobstructive at catheterization 2008  . GERD (gastroesophageal reflux disease)   . Irritable bowel syndrome   . Chronic anemia   .  Hypothyroidism   . Hyperlipidemia   . Chronic edema   . Pneumonia   . Diabetes mellitus   . Hypertension   . Chronic pain   . Anemia 08/04/2011  . Renal insufficiency 08/07/2011  . Anemia of chronic disease 08/04/2011  . Bulging lumbar disc     3  . Headache(784.0) 07/30/2013  . Sprain of neck 07/30/2013    Past Surgical History  Procedure Laterality Date  . Cholecystectomy    . Appendectomy    . Foot surgery      Family History  Problem Relation Age of Onset  . Hypertension    . Diabetes Brother   . Migraines Brother     Social history:  reports that she quit smoking about 17 years ago. Her smoking use included Cigarettes. She has a 48 pack-year smoking history. She has never used smokeless tobacco. She reports that she does not drink alcohol or use illicit drugs.  Medications:  Current Outpatient Prescriptions on File Prior to Visit  Medication Sig Dispense Refill  . acetaminophen (TYLENOL) 325 MG tablet Take 650 mg by mouth every 6 (six) hours as needed.        . ALPRAZolam (XANAX) 1 MG tablet Take 1 mg by mouth 3 (three) times daily.      . citalopram (CELEXA) 20 MG tablet Take 20 mg by mouth 2 (two) times daily.       [name] dexlansoprazole (DEXILANT) 60 MG capsule Take 60 mg by mouth daily.      . fenofibrate 160 MG  tablet Take 160 mg by mouth at bedtime.      . furosemide (LASIX) 20 MG tablet Take 80 mg by mouth 2 (two) times daily.       . hyoscyamine (LEVSIN SL) 0.125 MG SL tablet Place 1 tablet (0.125 mg total) under the tongue 3 (three) times daily as needed for cramping.  60 tablet  1  . levothyroxine (SYNTHROID, LEVOTHROID) 100 MCG tablet Take 100 mcg by mouth daily.        . Linaclotide (LINZESS) 145 MCG CAPS capsule Take 1 capsule (145 mcg total) by mouth daily.  30 capsule  5  . metFORMIN (GLUCOPHAGE) 1000 MG tablet Take 1,000 mg by mouth 2 (two) times daily with a meal.       . metoprolol tartrate (LOPRESSOR) 25 MG tablet Take 25 mg by mouth 3 (three) times daily.        [name] morphine (MS CONTIN) 30 MG 12 hr tablet Take 30 mg by mouth 3 (three) times daily. To treat pain associated with 3 bulging disc in back      . Nebulizer MISC by Does not apply route as directed.        . nitroGLYCERIN (NITROSTAT) 0.4 MG SL tablet Place 0.4 mg under the tongue every 5 (five) minutes as needed for chest pain.       [name] oxycodone (OXY-IR) 5 MG capsule Take 5 mg by mouth every 4 (four) hours as needed.      . pantoprazole (PROTONIX) 40 MG tablet TAKE ONE TABLET TWICE DAILY BEFORE A MEAL  60 tablet  5  . Potassium Chloride Crys CR (KLOR-CON M20 PO) Take 20 mEq by mouth 2 (two) times daily.      . traMADol (ULTRAM) 50 MG tablet Take 100 mg by mouth every 6 (six) hours as needed.       No current facility-administered medications on file prior to visit.      Allergies  Allergen Reactions  . Celebrex [Celecoxib]   . Codeine Nausea And Vomiting and Rash  . Gabapentin   . Vioxx [Rofecoxib]     ROS:  Out of a complete 14 system review of symptoms, the patient complains only of the following symptoms, and all other reviewed systems are negative.  Headache Shortness of breath Dizziness Difficulty with concentration  Blood pressure 128/56, pulse 77, height 5\' 6"  (1.676 m), weight 168 lb (76.204 kg).  Physical Exam  General: The patient is alert and cooperative at the time of the examination. Tympanic membranes are clear bilaterally.  Eyes: Pupils are equal, round, and reactive to light. Discs are flat bilaterally.  Neck: The neck is supple, no carotid bruits are noted.  Respiratory: The respiratory examination is clear.  Cardiovascular: The cardiovascular examination reveals a regular rate and rhythm, no obvious murmurs or rubs are noted.  Neuromuscular: The patient lacks about 25 of full lateral rotation of the cervical spine bilaterally.  Skin: Extremities are with 3-4+ edema below the knees bilaterally.  Neurologic Exam  Mental status: The patient is alert  and oriented x 3 at the time of the examination. The patient has apparent normal recent and remote memory, with an apparently normal attention span and concentration ability.  Cranial nerves: Facial symmetry is present. There is good sensation of the face to pinprick and soft touch on the right, decreased on the left. The strength of the facial muscles and the muscles to head turning and shoulder shrug are normal bilaterally. Speech is well enunciated, no  aphasia or dysarthria is noted. Extraocular movements are full. Visual fields are full. The tongue is midline, and the patient has symmetric elevation of the soft palate. No obvious hearing deficits are noted.  Motor: The motor testing reveals 5 over 5 strength of all 4 extremities, with exception of some 4/5 strength proximally in the legs bilaterally. Good symmetric motor tone is noted throughout.  Sensory: Sensory testing is intact to pinprick, soft touch, vibration sensation, and position sense on all 4 extremities, with exception that there is some decrease in pinprick sensation on the left arm and leg. Position sense is slightly decreased in both feet. No evidence of extinction is noted.  Coordination: Cerebellar testing reveals good finger-nose-finger and heel-to-shin bilaterally.  Gait and station: Gait is slightly wide-based, the patient is able to ambulate independently. Tandem gait was not tested. Romberg is negative. No drift is seen.  Reflexes: Deep tendon reflexes are symmetric, but are depressed bilaterally. Toes are downgoing bilaterally.   CT head 06/05/2013:  IMPRESSION:  No evidence of acute intracranial abnormality.    Assessment/Plan:  1. Mild concussion following fall  2. Post concussive headache, dizziness  3. Cervical strain syndrome following fall  The patient has had daily headaches since her fall with a mild concussion. The patient has had CT scan evaluation of the brain. The patient will be placed on  low-dose Flexeril, and she will be set up for physical therapy for the neck and shoulders. The patient has a cervical strain that has also been associated with the fall. The patient will followup through this office in about 3 months. The patient already has opiate medications take for pain.  Marlan Palau. Keith Willis MD 07/30/2013 9:38 PM  [facility] [address] [place], [place] [id number]  Phone [phone] Fax [phone]

## 2013-07-30 NOTE — Patient Instructions (Signed)

## 2013-08-18 ENCOUNTER — Telehealth: Payer: Self-pay | Admitting: Neurology

## 2013-08-18 ENCOUNTER — Inpatient Hospital Stay (HOSPITAL_COMMUNITY)
Admission: EM | Admit: 2013-08-18 | Discharge: 2013-08-23 | DRG: 641 | Disposition: A | Discharge status: Home / Self Care | Payer: Medicare Other | Attending: Family Medicine | Admitting: Family Medicine

## 2013-08-18 ENCOUNTER — Observation Stay (HOSPITAL_COMMUNITY): Payer: Medicare Other

## 2013-08-18 ENCOUNTER — Encounter (HOSPITAL_COMMUNITY): Payer: Self-pay | Admitting: Emergency Medicine

## 2013-08-18 DIAGNOSIS — E039 Hypothyroidism, unspecified: Secondary | ICD-10-CM | POA: Diagnosis present

## 2013-08-18 DIAGNOSIS — I519 Heart disease, unspecified: Secondary | ICD-10-CM

## 2013-08-18 DIAGNOSIS — J449 Chronic obstructive pulmonary disease, unspecified: Secondary | ICD-10-CM | POA: Diagnosis present

## 2013-08-18 DIAGNOSIS — T7840XA Allergy, unspecified, initial encounter: Secondary | ICD-10-CM | POA: Diagnosis present

## 2013-08-18 DIAGNOSIS — R778 Other specified abnormalities of plasma proteins: Secondary | ICD-10-CM

## 2013-08-18 DIAGNOSIS — I5032 Chronic diastolic (congestive) heart failure: Secondary | ICD-10-CM | POA: Diagnosis present

## 2013-08-18 DIAGNOSIS — Z833 Family history of diabetes mellitus: Secondary | ICD-10-CM

## 2013-08-18 DIAGNOSIS — I509 Heart failure, unspecified: Secondary | ICD-10-CM | POA: Diagnosis present

## 2013-08-18 DIAGNOSIS — R519 Headache, unspecified: Secondary | ICD-10-CM | POA: Diagnosis present

## 2013-08-18 DIAGNOSIS — I2510 Atherosclerotic heart disease of native coronary artery without angina pectoris: Secondary | ICD-10-CM | POA: Diagnosis present

## 2013-08-18 DIAGNOSIS — T380X5A Adverse effect of glucocorticoids and synthetic analogues, initial encounter: Secondary | ICD-10-CM | POA: Diagnosis present

## 2013-08-18 DIAGNOSIS — E860 Dehydration: Principal | ICD-10-CM | POA: Diagnosis present

## 2013-08-18 DIAGNOSIS — R748 Abnormal levels of other serum enzymes: Secondary | ICD-10-CM | POA: Diagnosis not present

## 2013-08-18 DIAGNOSIS — R7989 Other specified abnormal findings of blood chemistry: Secondary | ICD-10-CM

## 2013-08-18 DIAGNOSIS — S060X0A Concussion without loss of consciousness, initial encounter: Secondary | ICD-10-CM | POA: Diagnosis present

## 2013-08-18 DIAGNOSIS — Z87891 Personal history of nicotine dependence: Secondary | ICD-10-CM

## 2013-08-18 DIAGNOSIS — G894 Chronic pain syndrome: Secondary | ICD-10-CM | POA: Diagnosis present

## 2013-08-18 DIAGNOSIS — R51 Headache: Secondary | ICD-10-CM | POA: Diagnosis present

## 2013-08-18 DIAGNOSIS — Z79899 Other long term (current) drug therapy: Secondary | ICD-10-CM

## 2013-08-18 DIAGNOSIS — I252 Old myocardial infarction: Secondary | ICD-10-CM

## 2013-08-18 DIAGNOSIS — D638 Anemia in other chronic diseases classified elsewhere: Secondary | ICD-10-CM | POA: Diagnosis present

## 2013-08-18 DIAGNOSIS — I10 Essential (primary) hypertension: Secondary | ICD-10-CM | POA: Diagnosis present

## 2013-08-18 DIAGNOSIS — I4891 Unspecified atrial fibrillation: Secondary | ICD-10-CM | POA: Diagnosis present

## 2013-08-18 DIAGNOSIS — M129 Arthropathy, unspecified: Secondary | ICD-10-CM | POA: Diagnosis present

## 2013-08-18 DIAGNOSIS — E119 Type 2 diabetes mellitus without complications: Secondary | ICD-10-CM | POA: Diagnosis present

## 2013-08-18 DIAGNOSIS — J4489 Other specified chronic obstructive pulmonary disease: Secondary | ICD-10-CM | POA: Diagnosis present

## 2013-08-18 DIAGNOSIS — J438 Other emphysema: Secondary | ICD-10-CM | POA: Diagnosis present

## 2013-08-18 DIAGNOSIS — K589 Irritable bowel syndrome without diarrhea: Secondary | ICD-10-CM | POA: Diagnosis present

## 2013-08-18 DIAGNOSIS — E785 Hyperlipidemia, unspecified: Secondary | ICD-10-CM | POA: Diagnosis present

## 2013-08-18 DIAGNOSIS — Z9181 History of falling: Secondary | ICD-10-CM

## 2013-08-18 DIAGNOSIS — R079 Chest pain, unspecified: Secondary | ICD-10-CM

## 2013-08-18 DIAGNOSIS — K219 Gastro-esophageal reflux disease without esophagitis: Secondary | ICD-10-CM | POA: Diagnosis present

## 2013-08-18 DIAGNOSIS — Z9981 Dependence on supplemental oxygen: Secondary | ICD-10-CM

## 2013-08-18 DIAGNOSIS — I4892 Unspecified atrial flutter: Secondary | ICD-10-CM

## 2013-08-18 HISTORY — DX: Unspecified asthma, uncomplicated: J45.909

## 2013-08-18 LAB — URINALYSIS, ROUTINE W REFLEX MICROSCOPIC
Bilirubin Urine: NEGATIVE
Glucose, UA: NEGATIVE mg/dL
Hgb urine dipstick: NEGATIVE
KETONES UR: NEGATIVE mg/dL
LEUKOCYTES UA: NEGATIVE
NITRITE: NEGATIVE
PH: 7.5 (ref 5.0–8.0)
Protein, ur: NEGATIVE mg/dL
Specific Gravity, Urine: 1.01 (ref 1.005–1.030)
Urobilinogen, UA: 0.2 mg/dL (ref 0.0–1.0)

## 2013-08-18 LAB — RAPID URINE DRUG SCREEN, HOSP PERFORMED
Amphetamines: NOT DETECTED
BARBITURATES: NOT DETECTED
Benzodiazepines: POSITIVE — AB
COCAINE: NOT DETECTED
Opiates: POSITIVE — AB
TETRAHYDROCANNABINOL: NOT DETECTED

## 2013-08-18 LAB — COMPREHENSIVE METABOLIC PANEL
ALT: 6 U/L (ref 0–35)
AST: 15 U/L (ref 0–37)
Albumin: 3.4 g/dL — ABNORMAL LOW (ref 3.5–5.2)
Alkaline Phosphatase: 66 U/L (ref 39–117)
BUN: 34 mg/dL — ABNORMAL HIGH (ref 6–23)
CO2: 30 mEq/L (ref 19–32)
Calcium: 9.8 mg/dL (ref 8.4–10.5)
Chloride: 99 mEq/L (ref 96–112)
Creatinine, Ser: 1.09 mg/dL (ref 0.50–1.10)
GFR calc Af Amer: 59 mL/min — ABNORMAL LOW (ref >90)
GFR calc non Af Amer: 51 mL/min — ABNORMAL LOW (ref >90)
Glucose, Bld: 123 mg/dL — ABNORMAL HIGH (ref 70–99)
Potassium: 4.9 mEq/L (ref 3.7–5.3)
Sodium: 140 mEq/L (ref 137–147)
Total Bilirubin: 0.4 mg/dL (ref 0.3–1.2)
Total Protein: 7.4 g/dL (ref 6.0–8.3)

## 2013-08-18 LAB — CBC WITH DIFFERENTIAL/PLATELET
Basophils Absolute: 0 10*3/uL (ref 0.0–0.1)
Basophils Relative: 0 % (ref 0–1)
Eosinophils Absolute: 0 10*3/uL (ref 0.0–0.7)
Eosinophils Relative: 0 % (ref 0–5)
HCT: 34.4 % — ABNORMAL LOW (ref 36.0–46.0)
Hemoglobin: 10.6 g/dL — ABNORMAL LOW (ref 12.0–15.0)
Lymphocytes Relative: 8 % — ABNORMAL LOW (ref 12–46)
Lymphs Abs: 1.4 10*3/uL (ref 0.7–4.0)
MCH: 29 pg (ref 26.0–34.0)
MCHC: 30.8 g/dL (ref 30.0–36.0)
MCV: 94 fL (ref 78.0–100.0)
Monocytes Absolute: 0.7 10*3/uL (ref 0.1–1.0)
Monocytes Relative: 4 % (ref 3–12)
Neutro Abs: 15.6 10*3/uL — ABNORMAL HIGH (ref 1.7–7.7)
Neutrophils Relative %: 88 % — ABNORMAL HIGH (ref 43–77)
Platelets: 411 10*3/uL — ABNORMAL HIGH (ref 150–400)
RBC: 3.66 MIL/uL — ABNORMAL LOW (ref 3.87–5.11)
RDW: 13.3 % (ref 11.5–15.5)
WBC: 17.7 10*3/uL — ABNORMAL HIGH (ref 4.0–10.5)

## 2013-08-18 LAB — GLUCOSE, CAPILLARY: Glucose-Capillary: 138 mg/dL — ABNORMAL HIGH (ref 70–99)

## 2013-08-18 MED ORDER — SENNA 8.6 MG PO TABS
1.0000 | ORAL_TABLET | Freq: Two times a day (BID) | ORAL | Status: DC
  Administered 2013-08-18 – 2013-08-23 (×9): 8.6 mg via ORAL
  Filled 2013-08-18 (×10): qty 1

## 2013-08-18 MED ORDER — MORPHINE SULFATE 30 MG PO TB12: 30.0000 mg | ORAL_TABLET | Freq: Two times a day (BID) | ORAL | Status: DC

## 2013-08-18 MED ORDER — LORAZEPAM 2 MG/ML IJ SOLN
1.0000 mg | Freq: Four times a day (QID) | INTRAMUSCULAR | Status: AC | PRN
  Administered 2013-08-19 (×2): 1 mg via INTRAVENOUS
  Filled 2013-08-18 (×2): qty 1

## 2013-08-18 MED ORDER — FERROUS SULFATE 325 (65 FE) MG PO TABS
325.0000 mg | ORAL_TABLET | Freq: Every day | ORAL | Status: DC
  Administered 2013-08-19 – 2013-08-23 (×5): 325 mg via ORAL
  Filled 2013-08-18 (×5): qty 1

## 2013-08-18 MED ORDER — BIOTENE DRY MOUTH MT LIQD
15.0000 mL | Freq: Two times a day (BID) | OROMUCOSAL | Status: DC
  Administered 2013-08-18 – 2013-08-23 (×10): 15 mL via OROMUCOSAL

## 2013-08-18 MED ORDER — HEPARIN SODIUM (PORCINE) 5000 UNIT/ML IJ SOLN
5000.0000 [IU] | Freq: Three times a day (TID) | INTRAMUSCULAR | Status: DC
  Administered 2013-08-18 – 2013-08-23 (×13): 5000 [IU] via SUBCUTANEOUS
  Filled 2013-08-18 (×14): qty 1

## 2013-08-18 MED ORDER — METFORMIN HCL 500 MG PO TABS
1000.0000 mg | ORAL_TABLET | Freq: Two times a day (BID) | ORAL | Status: DC
  Administered 2013-08-19 – 2013-08-21 (×6): 1000 mg via ORAL
  Filled 2013-08-18 (×6): qty 2

## 2013-08-18 MED ORDER — INSULIN ASPART 100 UNIT/ML ~~LOC~~ SOLN: 0.0000 [IU] | Freq: Every day | SUBCUTANEOUS | Status: DC

## 2013-08-18 MED ORDER — ONDANSETRON HCL 4 MG/2ML IJ SOLN
4.0000 mg | Freq: Four times a day (QID) | INTRAMUSCULAR | Status: DC | PRN
  Administered 2013-08-18: 4 mg via INTRAVENOUS
  Filled 2013-08-18: qty 2

## 2013-08-18 MED ORDER — PANTOPRAZOLE SODIUM 40 MG IV SOLR
40.0000 mg | Freq: Once | INTRAVENOUS | Status: AC
  Administered 2013-08-18: 40 mg via INTRAVENOUS
  Filled 2013-08-18: qty 40

## 2013-08-18 MED ORDER — ONDANSETRON HCL 4 MG/2ML IJ SOLN
4.0000 mg | Freq: Once | INTRAMUSCULAR | Status: AC
Start: 2013-08-18 — End: 2013-08-18
  Administered 2013-08-18: 4 mg via INTRAVENOUS
  Filled 2013-08-18: qty 2

## 2013-08-18 MED ORDER — CITALOPRAM HYDROBROMIDE 20 MG PO TABS
20.0000 mg | ORAL_TABLET | Freq: Two times a day (BID) | ORAL | Status: DC
  Administered 2013-08-18 – 2013-08-23 (×10): 20 mg via ORAL
  Filled 2013-08-18 (×10): qty 1

## 2013-08-18 MED ORDER — LORAZEPAM 1 MG PO TABS
1.0000 mg | ORAL_TABLET | Freq: Four times a day (QID) | ORAL | Status: AC | PRN
  Administered 2013-08-19 – 2013-08-21 (×3): 1 mg via ORAL
  Filled 2013-08-18: qty 1
  Filled 2013-08-18 (×2): qty 2

## 2013-08-18 MED ORDER — FUROSEMIDE 80 MG PO TABS: 80.0000 mg | ORAL_TABLET | Freq: Two times a day (BID) | ORAL | Status: DC

## 2013-08-18 MED ORDER — ONDANSETRON HCL 4 MG PO TABS: 4.0000 mg | ORAL_TABLET | Freq: Four times a day (QID) | ORAL | Status: DC | PRN

## 2013-08-18 MED ORDER — SODIUM CHLORIDE 0.9 % IV SOLN: INTRAVENOUS | Status: DC

## 2013-08-18 MED ORDER — METOPROLOL TARTRATE 25 MG PO TABS
25.0000 mg | ORAL_TABLET | Freq: Three times a day (TID) | ORAL | Status: DC
  Administered 2013-08-18 – 2013-08-19 (×3): 25 mg via ORAL
  Filled 2013-08-18 (×3): qty 1

## 2013-08-18 MED ORDER — FENOFIBRATE 160 MG PO TABS
ORAL_TABLET | ORAL | Status: AC
  Filled 2013-08-18: qty 1

## 2013-08-18 MED ORDER — ALPRAZOLAM 1 MG PO TABS
1.0000 mg | ORAL_TABLET | Freq: Three times a day (TID) | ORAL | Status: DC
  Administered 2013-08-18 – 2013-08-23 (×13): 1 mg via ORAL
  Filled 2013-08-18 (×2): qty 1
  Filled 2013-08-18: qty 2
  Filled 2013-08-18 (×2): qty 1
  Filled 2013-08-18 (×2): qty 2
  Filled 2013-08-18 (×3): qty 1
  Filled 2013-08-18: qty 2
  Filled 2013-08-18: qty 1
  Filled 2013-08-18: qty 2

## 2013-08-18 MED ORDER — SODIUM CHLORIDE 0.9 % IV SOLN
INTRAVENOUS | Status: DC
  Administered 2013-08-19 – 2013-08-20 (×4): via INTRAVENOUS

## 2013-08-18 MED ORDER — LINACLOTIDE 145 MCG PO CAPS
145.0000 ug | ORAL_CAPSULE | Freq: Every day | ORAL | Status: DC
  Administered 2013-08-19 – 2013-08-23 (×3): 145 ug via ORAL
  Filled 2013-08-18 (×8): qty 1

## 2013-08-18 MED ORDER — FENOFIBRATE 160 MG PO TABS
160.0000 mg | ORAL_TABLET | Freq: Every day | ORAL | Status: DC
  Administered 2013-08-18 – 2013-08-23 (×5): 160 mg via ORAL
  Filled 2013-08-18 (×7): qty 1

## 2013-08-18 MED ORDER — HYDROMORPHONE HCL PF 1 MG/ML IJ SOLN
1.0000 mg | Freq: Once | INTRAMUSCULAR | Status: AC
  Administered 2013-08-18: 1 mg via INTRAVENOUS
  Filled 2013-08-18: qty 1

## 2013-08-18 MED ORDER — INSULIN ASPART 100 UNIT/ML ~~LOC~~ SOLN
0.0000 [IU] | Freq: Three times a day (TID) | SUBCUTANEOUS | Status: DC
  Administered 2013-08-20: 3 [IU] via SUBCUTANEOUS
  Administered 2013-08-21 – 2013-08-22 (×2): 2 [IU] via SUBCUTANEOUS
  Administered 2013-08-22: 3 [IU] via SUBCUTANEOUS
  Administered 2013-08-22 – 2013-08-23 (×2): 2 [IU] via SUBCUTANEOUS

## 2013-08-18 MED ORDER — SODIUM CHLORIDE 0.9 % IV BOLUS (SEPSIS)
1000.0000 mL | Freq: Once | INTRAVENOUS | Status: AC
  Administered 2013-08-18: 1000 mL via INTRAVENOUS

## 2013-08-18 MED ORDER — LEVOTHYROXINE SODIUM 100 MCG PO TABS
100.0000 ug | ORAL_TABLET | Freq: Every day | ORAL | Status: DC
  Administered 2013-08-19 – 2013-08-23 (×5): 100 ug via ORAL
  Filled 2013-08-18 (×5): qty 1

## 2013-08-18 MED ORDER — PANTOPRAZOLE SODIUM 40 MG PO TBEC
40.0000 mg | DELAYED_RELEASE_TABLET | Freq: Every day | ORAL | Status: DC
  Administered 2013-08-19 – 2013-08-23 (×5): 40 mg via ORAL
  Filled 2013-08-18 (×5): qty 1

## 2013-08-18 MED ORDER — NITROGLYCERIN 0.4 MG SL SUBL: 0.4000 mg | SUBLINGUAL_TABLET | SUBLINGUAL | Status: DC | PRN

## 2013-08-18 MED ORDER — MORPHINE SULFATE ER 30 MG PO TBCR
30.0000 mg | EXTENDED_RELEASE_TABLET | Freq: Two times a day (BID) | ORAL | Status: DC
  Administered 2013-08-18 – 2013-08-23 (×10): 30 mg via ORAL
  Filled 2013-08-18: qty 2
  Filled 2013-08-18 (×5): qty 1
  Filled 2013-08-18: qty 2
  Filled 2013-08-18: qty 1
  Filled 2013-08-18 (×2): qty 2

## 2013-08-18 MED ORDER — FUROSEMIDE 40 MG PO TABS
40.0000 mg | ORAL_TABLET | Freq: Two times a day (BID) | ORAL | Status: DC
  Administered 2013-08-18 – 2013-08-23 (×10): 40 mg via ORAL
  Filled 2013-08-18 (×10): qty 1

## 2013-08-18 NOTE — Telephone Encounter (Signed)
I called the patient, left a message. I will try to call back a little later.

## 2013-08-18 NOTE — ED Notes (Addendum)
Per EMS, pt fell recently and was diagnosed with a concussion and was prescribed prednisone. Pt reports intermittent "burning" sensation ever since starting prednisone on 08/11/13. Pt alert and has intermittent confusion per baseline. nad noted. Airway patent.

## 2013-08-18 NOTE — Telephone Encounter (Signed)
Spoke with patient and she said that she has never taken this medicine before(prednisone), extreme headaches, shakes, burning inside, not sleeping, called pharmacy and he told her to try taking after a meal, she did and took rest of her medication,and almost passed out. Informed per Lupita LeashDonna to call 911 or have someone take her to hospital, spoke with husband and he said that he will make it happen.  She has not discontinued the medicine as of yet.

## 2013-08-18 NOTE — Telephone Encounter (Signed)
Patient calling to state she is having "extreme prednisone side effects" and would like someone to call her back as soon as possible. Please call the patient.

## 2013-08-18 NOTE — Telephone Encounter (Signed)
I called the patient again, left a message again, I'll call back tomorrow.

## 2013-08-18 NOTE — ED Provider Notes (Addendum)
CSN: [id number]     Arrival date & time 08/18/13  1258 History  This chart was scribed for Benny Lennert, MD by Donne Anon, ED Scribe. This patient was seen in room APA14/APA14 and the patient's care was started at 1510.     First MD Initiated Contact with Patient 08/18/13 1510     Chief Complaint  Patient presents with  . Allergic Reaction    Patient is a 68 y.o. female presenting with weakness. The history is provided by the patient. No language interpreter was used.  Weakness This is a new problem. The current episode started 6 to 12 hours ago. The problem occurs daily. The problem has not changed since onset.Associated symptoms include headaches. Pertinent negatives include no chest pain. Nothing aggravates the symptoms. Nothing relieves the symptoms. She has tried nothing for the symptoms. The treatment provided no relief.   HPI Comments: Kara [name] is a 67 y.o. female with significant past medical history, who presents to the Emergency Department complaining of generalized weakness that began 17 days ago when she began taking Prednisone. She reports 3 months of a chronic HA. She had a fall in November 2014, and was diagnosed with a concussion and has had a HA ever since. She saw Dr. Renard Matter for this complaint, who prescribed her Prednisone on 08/11/2013 and referred her to Dr. Anne Hahn. She states she has been feeling "burning up on the inside" since she began the Prednisone, but denies fever. At 10:30 AM today she began experiencing shaking and generalized weakness. She reports associated diaphoresis.    Past Medical History  Diagnosis Date  . Type 2 diabetes mellitus   . Arthritis   . COPD (chronic obstructive pulmonary disease)     Home oxygen  . Palpitations     Reports history of atrial fibrillation, although none seen recently  . Coronary atherosclerosis of native coronary artery     Nonobstructive at catheterization 2008  . GERD (gastroesophageal reflux disease)   .  Irritable bowel syndrome   . Chronic anemia   . Hypothyroidism   . Hyperlipidemia   . Chronic edema   . Pneumonia   . Diabetes mellitus   . Hypertension   . Chronic pain   . Anemia 08/04/2011  . Renal insufficiency 08/07/2011  . Anemia of chronic disease 08/04/2011  . Bulging lumbar disc     3  . Headache(784.0) 07/30/2013  . Sprain of neck 07/30/2013   Past Surgical History  Procedure Laterality Date  . Cholecystectomy    . Appendectomy    . Foot surgery     Family History  Problem Relation Age of Onset  . Hypertension    . Diabetes Brother   . Migraines Brother    History  Substance Use Topics  . Smoking status: Former Smoker -- 1.00 packs/day for 48 years    Types: Cigarettes    Quit date: 12/28/1995  . Smokeless tobacco: Never Used  . Alcohol Use: No   OB History   Grav Para Term Preterm Abortions TAB SAB Ect Mult Living                 Review of Systems  Constitutional: Positive for diaphoresis. Negative for appetite change.  HENT: Negative for ear discharge.   Eyes: Negative for discharge.  Cardiovascular: Negative for chest pain.  Genitourinary: Negative for frequency and hematuria.  Neurological: Positive for weakness and headaches.  Psychiatric/Behavioral: Negative for hallucinations.    Allergies  Codeine and Vioxx  Home Medications   Current Outpatient Rx  Name  Route  Sig  Dispense  Refill  . acetaminophen (TYLENOL) 325 MG tablet   Oral   Take 650 mg by mouth every 6 (six) hours as needed.           . ALPRAZolam (XANAX) 1 MG tablet   Oral   Take 1 mg by mouth 3 (three) times daily.         . citalopram (CELEXA) 20 MG tablet   Oral   Take 20 mg by mouth 2 (two) times daily.          . cyclobenzaprine (FLEXERIL) 5 MG tablet   Oral   Take 1 tablet (5 mg total) by mouth 3 (three) times daily.   90 tablet   1   . fenofibrate 160 MG tablet   Oral   Take 160 mg by mouth at bedtime.         . ferrous sulfate 325 (65 FE) MG tablet    Oral   Take 325 mg by mouth daily with breakfast.         . furosemide (LASIX) 20 MG tablet   Oral   Take 80 mg by mouth 2 (two) times daily.          [name]. levothyroxine (SYNTHROID, LEVOTHROID) 100 MCG tablet   Oral   Take 100 mcg by mouth daily.           [name]. Linaclotide (LINZESS) 145 MCG CAPS capsule   Oral   Take 1 capsule (145 mcg total) by mouth daily.   30 capsule   5   . metFORMIN (GLUCOPHAGE) 1000 MG tablet   Oral   Take 1,000 mg by mouth 2 (two) times daily with a meal.          . metoprolol tartrate (LOPRESSOR) 25 MG tablet   Oral   Take 25 mg by mouth 3 (three) times daily.          [name]. morphine (MS CONTIN) 30 MG 12 hr tablet   Oral   Take 30 mg by mouth 3 (three) times daily. To treat pain associated with 3 bulging disc in back         . nitroGLYCERIN (NITROSTAT) 0.4 MG SL tablet   Sublingual   Place 0.4 mg under the tongue every 5 (five) minutes as needed for chest pain.          . pantoprazole (PROTONIX) 40 MG tablet      TAKE ONE TABLET TWICE DAILY BEFORE A MEAL   60 tablet   5   . Potassium Chloride Crys CR (KLOR-CON M20 PO)   Oral   Take 20 mEq by mouth 2 (two) times daily.         . predniSONE (DELTASONE) 10 MG tablet   Oral   Take 10 mg by mouth as directed. Take 6 tablets for 4 days, then 4 tablets for 4 days, then 2 tablets  For 4 days.         . Nebulizer MISC   Does not apply   by Does not apply route as directed.           . traMADol (ULTRAM) 50 MG tablet   Oral   Take 100 mg by mouth every 6 (six) hours as needed.          Triage Vitals; BP 162/81  Pulse 101  Temp(Src) 98.5 F (36.9 C) (Oral)  Resp 14  Ht  5\' 6"  (1.676 m)  Wt 168 lb (76.204 kg)  BMI 27.13 kg/m2  SpO2 100%  Physical Exam  Constitutional: She is oriented to person, place, and time. She appears well-developed.  General weakness   HENT:  Head: Normocephalic.  Mouth/Throat: Mucous membranes are dry.  Eyes: Conjunctivae and EOM are normal. No  scleral icterus.  Neck: Neck supple. No thyromegaly present.  Cardiovascular: Normal rate and regular rhythm.  Exam reveals no gallop and no friction rub.   No murmur heard. Pulmonary/Chest: No stridor. She has no wheezes. She has no rales. She exhibits no tenderness.  Abdominal: She exhibits no distension. There is no tenderness. There is no rebound.  Musculoskeletal: Normal range of motion. She exhibits no edema.  Lymphadenopathy:    She has no cervical adenopathy.  Neurological: She is oriented to person, place, and time. She exhibits normal muscle tone. Coordination normal.  Skin: No rash noted. No erythema.  Psychiatric: She has a normal mood and affect. Her behavior is normal.    ED Course  Procedures (including critical care time) DIAGNOSTIC STUDIES: Oxygen Saturation is 100% on RA, normal by my interpretation.    COORDINATION OF CARE: 3:26 PM Discussed treatment plan which includes 1000 mL .09% NaCL bolus, 40 mg Protonix injection, 1 mg Dilaudid injection, 4 mg Zofran injection, urinalysis, CBC with differential, comprehensive metabolic panel and in and out cath with pt at bedside and pt agreed to plan.   5:20 PM Rechecked pt, who reported no improvement in symptoms with medication. She reports still feeling generalized weakness, generalized HA, and generalized "burning all over." She reports she received a blood transfusion within the past 3 weeks. Pt reports the weakness is keeping her from walking. Will consult with Dr. Docia Chuck.   5:32 PM Case discussed with Dr. Deirdre Priest, who agreed to admit pt.   Labs Review Labs Reviewed  CBC WITH DIFFERENTIAL - Abnormal; Notable for the following:    WBC 17.7 (*)    RBC 3.66 (*)    Hemoglobin 10.6 (*)    HCT 34.4 (*)    Platelets 411 (*)    Neutrophils Relative % 88 (*)    Lymphocytes Relative 8 (*)    Neutro Abs 15.6 (*)    All other components within normal limits  COMPREHENSIVE METABOLIC PANEL - Abnormal; Notable for the  following:    Glucose, Bld 123 (*)    BUN 34 (*)    Albumin 3.4 (*)    GFR calc non Af Amer 51 (*)    GFR calc Af Amer 59 (*)    All other components within normal limits  URINALYSIS, ROUTINE W REFLEX MICROSCOPIC   Imaging Review No results found.  EKG Interpretation   None       MDM  The chart was scribed for me under my direct supervision.  I personally performed the history, physical, and medical decision making and all procedures in the evaluation of this patient.Benny Lennert, MD 08/22/13 1436  Benny Lennert, MD 08/22/13 [phone]

## 2013-08-18 NOTE — H&P (Signed)
Kara ShirtsCarolyn [name] MRN: [id number] DOB/AGE: 68/08/1945 68 y.o. Primary Care Physician:[name],ANGUS G, MD Admit date: 08/18/2013 Chief Complaint: Weakness, headache. HPI: This is a 68 year old lady who had an accidental fall with head injury approximately 2 months ago. She was seen by neurology and was prescribed Flexeril. Apparently she did not take this medicine. Approximately one week ago because of the intractable headache, she called the neurologist on call and was prescribed tapering course of prednisone. Since taking the prednisone, she is to come very weak, seems to have burning all over her body with being hot and cold and continues to have headache. She takes MS Contin 3 times a day! She also describes twitching of her body. This morning she was too weak to walk and was brought him to the emergency room by her friend. She is now being admitted for further evaluation.  Past Medical History  Diagnosis Date  . Type 2 diabetes mellitus   . Arthritis   . COPD (chronic obstructive pulmonary disease)     Home oxygen  . Palpitations     Reports history of atrial fibrillation, although none seen recently  . Coronary atherosclerosis of native coronary artery     Nonobstructive at catheterization 2008  . GERD (gastroesophageal reflux disease)   . Irritable bowel syndrome   . Chronic anemia   . Hypothyroidism   . Hyperlipidemia   . Chronic edema   . Pneumonia   . Diabetes mellitus   . Hypertension   . Chronic pain   . Anemia 08/04/2011  . Renal insufficiency 08/07/2011  . Anemia of chronic disease 08/04/2011  . Bulging lumbar disc     3  . Headache(784.0) 07/30/2013  . Sprain of neck 07/30/2013   Past Surgical History  Procedure Laterality Date  . Cholecystectomy    . Appendectomy    . Foot surgery          Family History  Problem Relation Age of Onset  . Hypertension    . Diabetes Brother   . Migraines Brother     Social History:  reports that she quit smoking about 17 years  ago. Her smoking use included Cigarettes. She has a 48 pack-year smoking history. She has never used smokeless tobacco. She reports that she does not drink alcohol or use illicit drugs.   Allergies:  Allergies  Allergen Reactions  . Codeine Nausea And Vomiting and Rash  . Vioxx [Rofecoxib]     Medications Prior to Admission  Medication Sig Dispense Refill  . acetaminophen (TYLENOL) 325 MG tablet Take 650 mg by mouth every 6 (six) hours as needed.        . ALPRAZolam (XANAX) 1 MG tablet Take 1 mg by mouth 3 (three) times daily.      . citalopram (CELEXA) 20 MG tablet Take 20 mg by mouth 2 (two) times daily.       . cyclobenzaprine (FLEXERIL) 5 MG tablet Take 1 tablet (5 mg total) by mouth 3 (three) times daily.  90 tablet  1  . fenofibrate 160 MG tablet Take 160 mg by mouth at bedtime.      . ferrous sulfate 325 (65 FE) MG tablet Take 325 mg by mouth daily with breakfast.      . furosemide (LASIX) 20 MG tablet Take 80 mg by mouth 2 (two) times daily.       [name]. levothyroxine (SYNTHROID, LEVOTHROID) 100 MCG tablet Take 100 mcg by mouth daily.        . Linaclotide (  LINZESS) 145 MCG CAPS capsule Take 1 capsule (145 mcg total) by mouth daily.  30 capsule  5  . metFORMIN (GLUCOPHAGE) 1000 MG tablet Take 1,000 mg by mouth 2 (two) times daily with a meal.       . metoprolol tartrate (LOPRESSOR) 25 MG tablet Take 25 mg by mouth 3 (three) times daily.       [name] morphine (MS CONTIN) 30 MG 12 hr tablet Take 30 mg by mouth 3 (three) times daily. To treat pain associated with 3 bulging disc in back      . nitroGLYCERIN (NITROSTAT) 0.4 MG SL tablet Place 0.4 mg under the tongue every 5 (five) minutes as needed for chest pain.       . pantoprazole (PROTONIX) 40 MG tablet TAKE ONE TABLET TWICE DAILY BEFORE A MEAL  60 tablet  5  . Potassium Chloride Crys CR (KLOR-CON M20 PO) Take 20 mEq by mouth 2 (two) times daily.      . predniSONE (DELTASONE) 10 MG tablet Take 10 mg by mouth as directed. Take 6 tablets for 4  days, then 4 tablets for 4 days, then 2 tablets  For 4 days.      . Nebulizer MISC by Does not apply route as directed.        . traMADol (ULTRAM) 50 MG tablet Take 100 mg by mouth every 6 (six) hours as needed.           ZOX:WRUEA from the symptoms mentioned above,there are no other symptoms referable to all systems reviewed.  Physical Exam: Blood pressure 176/80, pulse 88, temperature 98.1 F (36.7 C), temperature source Oral, resp. rate 20, height 5\' 6"  (1.676 m), weight 71.5 kg (157 lb 10.1 oz), SpO2 100.00%. She is clinically dehydrated. She is alert and orientated. Heart sounds are present without murmurs or added sounds. Lung fields are clear. Abdomen is soft and nontender. There are no focal neurological signs.    Recent Labs  08/18/13 1557  WBC 17.7*  NEUTROABS 15.6*  HGB 10.6*  HCT 34.4*  MCV 94.0  PLT 411*    Recent Labs  08/18/13 1557  NA 140  K 4.9  CL 99  CO2 30  GLUCOSE 123*  BUN 34*  CREATININE 1.09  CALCIUM 9.8         Ct Head Wo Contrast  08/18/2013   CLINICAL DATA:  Altered mental status, question allergic reaction, history hypertension, diabetes, coronary artery disease, COPD  EXAM: CT HEAD WITHOUT CONTRAST  TECHNIQUE: Contiguous axial images were obtained from the base of the skull through the vertex without intravenous contrast.  COMPARISON:  06/05/2013  FINDINGS: Mild generalized atrophy.  Normal ventricular morphology.  No midline shift or mass effect.  Question old lacunar infarct versus prominent perivascular space at right basal ganglia, stable.  No intracranial hemorrhage, mass lesion or evidence of acute infarction.  No extra-axial fluid collections.  Bones and sinuses unremarkable.  IMPRESSION: No acute intracranial abnormalities.   Electronically Signed   By: Ulyses Southward M.D.   On: 08/18/2013 18:47   Impression: 1. Dehydration. 2. Side effects of steroid therapy. 3. Possible opioid toxicity based on her history and dehydrated  state. 4. COPD, stable. 5. Hypertension, uncontrolled. 6. Hypothyroidism. 7. Anemia of chronic disease. 8. Concussion with sequelae of headaches.     Plan: 1. Admit to medical floor. 2. Intravenous fluids. 3. Reduce MS Contin to twice a day. 4. Discontinue steroids. 5. Physical therapy evaluation.  Other recommendations will depend on  patient's hospital progress.      GOSRANI,NIMISH C   08/18/2013, 7:34 PM

## 2013-08-19 ENCOUNTER — Inpatient Hospital Stay (HOSPITAL_COMMUNITY): Payer: Medicare Other

## 2013-08-19 ENCOUNTER — Telehealth: Payer: Self-pay | Admitting: Neurology

## 2013-08-19 LAB — COMPREHENSIVE METABOLIC PANEL
ALT: 5 U/L (ref 0–35)
AST: 13 U/L (ref 0–37)
Albumin: 3 g/dL — ABNORMAL LOW (ref 3.5–5.2)
Alkaline Phosphatase: 57 U/L (ref 39–117)
BUN: 28 mg/dL — AB (ref 6–23)
CALCIUM: 9.1 mg/dL (ref 8.4–10.5)
CO2: 33 mEq/L — ABNORMAL HIGH (ref 19–32)
CREATININE: 1.22 mg/dL — AB (ref 0.50–1.10)
Chloride: 101 mEq/L (ref 96–112)
GFR calc non Af Amer: 45 mL/min — ABNORMAL LOW (ref >90)
GFR, EST AFRICAN AMERICAN: 52 mL/min — AB (ref >90)
Glucose, Bld: 84 mg/dL (ref 70–99)
Potassium: 4 mEq/L (ref 3.7–5.3)
Sodium: 144 mEq/L (ref 137–147)
TOTAL PROTEIN: 6.6 g/dL (ref 6.0–8.3)
Total Bilirubin: 0.5 mg/dL (ref 0.3–1.2)

## 2013-08-19 LAB — CBC
HEMATOCRIT: 32.5 % — AB (ref 36.0–46.0)
HEMOGLOBIN: 10 g/dL — AB (ref 12.0–15.0)
MCH: 29.3 pg (ref 26.0–34.0)
MCHC: 30.8 g/dL (ref 30.0–36.0)
MCV: 95.3 fL (ref 78.0–100.0)
Platelets: 336 10*3/uL (ref 150–400)
RBC: 3.41 MIL/uL — AB (ref 3.87–5.11)
RDW: 13.6 % (ref 11.5–15.5)
WBC: 16.1 10*3/uL — AB (ref 4.0–10.5)

## 2013-08-19 LAB — GLUCOSE, CAPILLARY
GLUCOSE-CAPILLARY: 117 mg/dL — AB (ref 70–99)
Glucose-Capillary: 112 mg/dL — ABNORMAL HIGH (ref 70–99)
Glucose-Capillary: 98 mg/dL (ref 70–99)
Glucose-Capillary: 117 mg/dL — ABNORMAL HIGH (ref 70–99)

## 2013-08-19 LAB — TSH: TSH: 1.249 u[IU]/mL (ref 0.350–4.500)

## 2013-08-19 LAB — MRSA PCR SCREENING: MRSA BY PCR: NEGATIVE

## 2013-08-19 LAB — SEDIMENTATION RATE: SED RATE: 35 mm/h — AB (ref 0–22)

## 2013-08-19 LAB — TROPONIN I: Troponin I: 0.3 ng/mL (ref <0.30)

## 2013-08-19 MED ORDER — DILTIAZEM HCL 100 MG IV SOLR
5.0000 mg/h | INTRAVENOUS | Status: DC
  Administered 2013-08-19: 10 mg/h via INTRAVENOUS
  Administered 2013-08-20: 7.5 mg/h via INTRAVENOUS
  Filled 2013-08-19: qty 100

## 2013-08-19 MED ORDER — METOPROLOL TARTRATE 1 MG/ML IV SOLN
5.0000 mg | Freq: Once | INTRAVENOUS | Status: AC
  Administered 2013-08-19: 5 mg via INTRAVENOUS
  Filled 2013-08-19: qty 5

## 2013-08-19 MED ORDER — METOPROLOL TARTRATE 25 MG PO TABS
25.0000 mg | ORAL_TABLET | Freq: Four times a day (QID) | ORAL | Status: DC
  Administered 2013-08-19 – 2013-08-21 (×6): 25 mg via ORAL
  Filled 2013-08-19 (×6): qty 1

## 2013-08-19 NOTE — Telephone Encounter (Signed)
Called patient and spoke with patient's sister concerning the [facility] being [facility] Per [name] stated that Dr. [name] would have to have the patient transferred, since he was the admitting doctor. I explained to the sister and advised her that if the patient has any other problems, questions or concerns to call the office. Patient's sister verbalized understanding.

## 2013-08-19 NOTE — Progress Notes (Signed)
Utilization Review Complete  

## 2013-08-19 NOTE — Progress Notes (Signed)
Patient transferred via bed to ICU-06

## 2013-08-19 NOTE — Telephone Encounter (Signed)
Patient's sister calling states that patient is at [facility] right now and would like to be [facility]. Please call patient's sister as soon as possible.

## 2013-08-19 NOTE — Telephone Encounter (Signed)
The patient apparently was admitted to the hospital yesterday. She was felt to have side effects from the steroids, and possible dehydration. The narcotic dosing will be decreased. Steroids will be discontinued. I do not called patient today, as she is in the hospital.

## 2013-08-19 NOTE — Progress Notes (Signed)
Pt is refusing to have MRI d/t claustrophobia, and states that even if she is given something to help her relax she would not be able to do the test. Dr. Gerilyn Pilgrimoonquah was notified, and no new orders received. Sheryn BisonGordon, Amariz Flamenco Warner

## 2013-08-19 NOTE — Evaluation (Signed)
Physical Therapy Evaluation Patient Details Name: Kara [name] MRN: [id number] DOB: 05/19/1946 Today's Date: 08/19/2013 Time: [id number] PT Time Calculation (min): 33 min  PT Assessment / Plan / Recommendation History of Present Illness  Pt is admitted due to progressive weakness and dehydration.  She has a hx of chronic HA following a head injury after a fall in 11-14.  She is O2 dependent due to COPD (3 L/min).  Per MD report she has been developed opiod toxicity as well as having a reaction to prednisone which she started last week.  Pt today reports that she has been sweating and feels as if she has a "fire burning inside of me".  She lives alone with family out of town.  Clinical Impression   Pt was seen for evaluation.  She demonstrated general deconditioning and was tremulous at times.  She was very cooperative and her mobility in bed and with transfers was only minimally limited.  With sitting, HR was 145-155 and we were unable to progress to gait.  She may very well need a walker at this time due to malaise and chronic back pain.  My best guess is that she will be able to go home at d/c with HHPT but this may have to be altered once we are able to get her walking.  RN was notified about elevated HR.    PT Assessment  Patient needs continued PT services    Follow Up Recommendations  Home health PT    Does the patient have the potential to tolerate intense rehabilitation      Barriers to Discharge Decreased caregiver support      Equipment Recommendations       Recommendations for Other Services OT consult   Frequency Min 3X/week    Precautions / Restrictions Precautions Precautions: Fall Restrictions Weight Bearing Restrictions: No   Pertinent Vitals/Pain       Mobility  Bed Mobility Overal bed mobility: Independent Transfers Overall transfer level: Modified independent Equipment used: None Ambulation/Gait General Gait Details: gait not done due to elevated  HR    Exercises     PT Diagnosis: Difficulty walking;Generalized weakness  PT Problem List: Decreased strength;Decreased activity tolerance;Decreased mobility PT Treatment Interventions: Gait training;Functional mobility training;Therapeutic exercise;Patient/family education     PT Goals(Current goals can be found in the care plan section) Acute Rehab PT Goals Patient Stated Goal: none stated PT Goal Formulation: With patient Time For Goal Achievement: 10/03/13  Visit Information  Last PT Received On: 08/19/13 History of Present Illness: Pt is admitted due to progressive weakness and dehydration.  She has a hx of chronic HA following a head injury after a fall in 11-14.  She is O2 dependent due to COPD (3 L/min).  Per MD report she has been developed opiod toxicity as well as having a reaction to prednisone which she started last week.  Pt today reports that she has been sweating and feels as if she has a "fire burning inside of me".  She lives alone with family out of town.       Prior Functioning  Home Living Family/patient expects to be discharged to:: Private residence Living Arrangements: Alone Available Help at Discharge: Family;Available PRN/intermittently Type of Home: House Home Access: Ramped entrance Home Layout: One level Home Equipment: Walker - 2 wheels;Crutches Prior Function Level of Independence: Independent Communication Communication: No difficulties    Cognition  Cognition Arousal/Alertness: Awake/alert Behavior During Therapy: WFL for tasks assessed/performed Overall Cognitive Status: Within Functional Limits for  tasks assessed    Extremity/Trunk Assessment Lower Extremity Assessment Lower Extremity Assessment: Generalized weakness (very tremulous)   Balance    End of Session PT - End of Session Activity Tolerance: Treatment limited secondary to medical complications (Comment) Patient left: in chair;with call bell/phone within reach Nurse  Communication: Mobility status  GP Functional Assessment Tool Used: clinical judgement Functional Limitation: Mobility: Walking and moving around Mobility: Walking and Moving Around Current Status (U9811): At least 1 percent but less than 20 percent impaired, limited or restricted Mobility: Walking and Moving Around Goal Status [id number]): 0 percent impaired, limited or restricted   Konrad Penta 08/19/2013, 4:34 PM

## 2013-08-19 NOTE — Care Management Note (Addendum)
    Page 1 of 2   08/22/2013     2:28:16 PM   CARE MANAGEMENT NOTE 08/22/2013  Patient:  Kara ShirtsMEADOR,Lili W   Account Number:  [id number]  Date Initiated:  08/19/2013  Documentation initiated by:  Rosemary HolmsOBSON,AMY  Subjective/Objective Assessment:   Pt from home. States she is very weak and shakey.     Action/Plan:   Anticipated DC Date:  08/20/2013   Anticipated DC Plan:  HOME W HOME HEALTH SERVICES      DC Planning Services  CM consult      [facility]  [facility]   Choice offered to / List presented to:  C-1 Patient        HH arranged  HH-2 PT      [facility]  [facility]   Status of service:  Completed, signed off Medicare Important Message given?  YES (If response is "NO", the following Medicare IM given date fields will be blank) Date Medicare IM given:  08/22/2013 Date Additional Medicare IM given:    Discharge Disposition:  HOME/SELF CARE  Per UR Regulation:    If discussed at Long Length of Stay Meetings, dates discussed:    Comments:  08/22/13 1430 Arlyss Queenammy Danel Requena, RN BSN CM [facility]. Alroy BailiffLinda Lothian [facility] is aware and will collect the pts information from the chart. HH services to start within 48 hours of discharge. Pt and pts nurse aware of discharge arrangements.  08/20/13 1415 Arlyss Queenammy Willo Yoon, RN BSN CM CM spoke with pt about discharge plans. Pt lives alone but has a brother and sister who will be taking turns staying with pt at discharge. Pt is currently independent with ADL's. Pt has home O2 with [facility]. No anticipated discharge needs at this time.

## 2013-08-19 NOTE — Progress Notes (Signed)
After speaking with Dr. [name], cardiology consult was placed. NP was consulted on pt, she plans to see pt in am, but gave some orders for tonight. NP ordered troponins to be drawn on pt q6hr x3, metoprolol 5mg  IV once, and increased frequency of po metoprolol to QID. IV metoprolol was given. Troponin result was called back and was 0.30. Dr. Sudie BaileyKnowlton was on call at this time and was made aware of the situation. pts HR continues to be in 120's-130's after metoprolol. Order received for cardizem gtt and to transfer pt to stepdown. Night shift nurse is aware of situation and is awaiting call from ICU nurse so that she can give report.  Sheryn BisonGordon, Myrle Wanek Warner

## 2013-08-19 NOTE — Progress Notes (Signed)
CRITICAL VALUE ALERT  Critical value received: troponin 0.30  Date of notification:  08/19/13  Time of notification: 1920  Critical value read back:yes  Nurse who received alert:  Reece PackerSarah jacobs, rn  MD notified (1st page):  knowlton  Time of first page:  1930  MD notified (2nd page):  Time of second page:  Responding MD:  Sudie BaileyKnowlton  Time MD responded:  [phone]

## 2013-08-19 NOTE — Progress Notes (Signed)
Notified Dr. [name] that pt was unable to work with PT because her HR was in the 140's. I notified him that she did sound irregular on my assessment this morning, but this afternoon she no longer sounded irregular, but did sound tachycardic at 140bpm. Pt continues to state burning feeling in her chest, and that she feels like her heart beats harder when she is getting up/moving around, and feels better when she is still. BP is WNL. Order received for 12 lead EKG and to notify him it is any different than yesterdays EKG. Sheryn BisonGordon, Larone Kliethermes Warner

## 2013-08-19 NOTE — Consult Note (Signed)
Levasy A. Merlene Laughter, MD     [email or web address]          Kara [name] is an 68 y.o. female.   ASSESSMENT/PLAN: 1. Likely medication effect presented with dizziness, diaphoresis, palpitation and tremors. The effect is most likely from prednisone. This has been appropriately discontinued. 2. Recalcitrant headache of unclear etiology. An MRI will be obtained given the persistent headaches. A sedimentation rate will also be done. The patient does have a baseline history of episodic primary headaches however and this current headache could represent transformed migraine headaches from chronic opioid therapy and possible chronic medication overuse. 3. Chronic pain syndrome on chronic opioids.  The patient presents with marked fatigue, gait impairment and diaphoresis over the last day or 2. She believes that it is coming from prednisone. She was started on a tapered dose by Dr. Floyde Parkins one of the neurologist in [place] because of the patient having persistent headaches. The patient reports that she has been on the medication for about 2 weeks. She indicates that she has just not done well with the medication. She's had hot-like sensation, diaphoresis, palpitation and tremors since she has been on the medication. Interestingly, she reports that short intensive precipitated the spells when she is on medication. She decided to come to emergency room and seek further assistance because of the severe fatigue and worsening symptoms. The patient has a lifelong history of episodic headaches. However, in November 2014 she developed more severe persistent headache. She reports that she essentially has had a constant headache since then. She reports essentially having a holo  - cephalic headache. She does not necessarily report having nausea vomiting with these headaches. The patient is on chronic opioids for chronic pain syndrome. She reports not taking more of her medications. She  also has necessarily not been cut back on the medication and seem not to have an opioid withdrawal syndrome. The patient complains of palpitation recently with the current spells. She denies any shortness of breath. Review of systems otherwise negative. Patient reports that her dizziness is described as a spinning like sensation with these spells. She is currently going through one of her spells and has been ongoing for over an hour.       GENERAL: The patient's has been having one of her spells while being evaluated. She appears to be in significant discomfort from the spells.  HEENT: Supple. Atraumatic normocephalic.   ABDOMEN: soft  EXTREMITIES: No edema   BACK: Normal.  SKIN: Normal by inspection.    MENTAL STATUS: Alert and oriented. Speech, language and cognition are generally intact. Judgment and insight normal.   CRANIAL NERVES: Pupils are equal, round and reactive to light and accommodation; extra ocular movements are full, there is no significant nystagmus; visual fields are full; upper and lower facial muscles are normal in strength and symmetric, there is no flattening of the nasolabial folds; tongue is midline; uvula is midline; shoulder elevation is normal.  MOTOR: Normal tone, bulk and strength In the upper extremities; no pronator drift. Hip flexion is 4/5 bilaterally. Dorsiflexion 5.  COORDINATION: Left finger to nose is normal, right finger to nose is normal, No rest tremor; no intention tremor; no postural tremor; no bradykinesia.  REFLEXES: Deep tendon reflexes are symmetrical and normal. Babinski reflexes are flexor bilaterally.   SENSATION: Normal to light touch.      Past Medical History  Diagnosis Date  . Type 2 diabetes mellitus   . Arthritis   . COPD (chronic  obstructive pulmonary disease)     Home oxygen  . Palpitations     Reports history of atrial fibrillation, although none seen recently  . Coronary atherosclerosis of native coronary artery      Nonobstructive at catheterization 2008  . GERD (gastroesophageal reflux disease)   . Irritable bowel syndrome   . Chronic anemia   . Hypothyroidism   . Hyperlipidemia   . Chronic edema   . Pneumonia   . Diabetes mellitus   . Hypertension   . Chronic pain   . Anemia 08/04/2011  . Renal insufficiency 08/07/2011  . Anemia of chronic disease 08/04/2011  . Bulging lumbar disc     3  . Headache(784.0) 07/30/2013  . Sprain of neck 07/30/2013    Past Surgical History  Procedure Laterality Date  . Cholecystectomy    . Appendectomy    . Foot surgery      Family History  Problem Relation Age of Onset  . Hypertension    . Diabetes Brother   . Migraines Brother     Social History:  reports that she quit smoking about 17 years ago. Her smoking use included Cigarettes. She has a 48 pack-year smoking history. She has never used smokeless tobacco. She reports that she does not drink alcohol or use illicit drugs.  Allergies:  Allergies  Allergen Reactions  . Codeine Nausea And Vomiting and Rash  . Vioxx [Rofecoxib]     Medications:  Blood pressure 137/66, pulse 103, temperature 98.3 F (36.8 C), temperature source Oral, resp. rate 20, height _0  (1.676 m), weight 71.5 kg (157 lb 10.1 oz), SpO2 100.00%.   Results for orders placed during the hospital encounter of 08/18/13 (from the past 48 hour(s))  URINALYSIS, ROUTINE W REFLEX MICROSCOPIC     Status: None   Collection Time    08/18/13  3:50 PM      Result Value Range   Color, Urine YELLOW  YELLOW   APPearance CLEAR  CLEAR   Specific Gravity, Urine 1.010  1.005 - 1.030   pH 7.5  5.0 - 8.0   Glucose, UA NEGATIVE  NEGATIVE mg/dL   Hgb urine dipstick NEGATIVE  NEGATIVE   Bilirubin Urine NEGATIVE  NEGATIVE   Ketones, ur NEGATIVE  NEGATIVE mg/dL   Protein, ur NEGATIVE  NEGATIVE mg/dL   Urobilinogen, UA 0.2  0.0 - 1.0 mg/dL   Nitrite NEGATIVE  NEGATIVE   Leukocytes, UA NEGATIVE  NEGATIVE   Comment: MICROSCOPIC NOT DONE ON URINES  WITH NEGATIVE PROTEIN, BLOOD, LEUKOCYTES, NITRITE, OR GLUCOSE <1000 mg/dL.  URINE RAPID DRUG SCREEN (HOSP PERFORMED)     Status: Abnormal   Collection Time    08/18/13  3:50 PM      Result Value Range   Opiates POSITIVE (*) NONE DETECTED   Cocaine NONE DETECTED  NONE DETECTED   Benzodiazepines POSITIVE (*) NONE DETECTED   Amphetamines NONE DETECTED  NONE DETECTED   Tetrahydrocannabinol NONE DETECTED  NONE DETECTED   Barbiturates NONE DETECTED  NONE DETECTED   Comment:            DRUG SCREEN FOR MEDICAL PURPOSES     ONLY.  IF CONFIRMATION IS NEEDED     FOR ANY PURPOSE, NOTIFY LAB     WITHIN 5 DAYS.                LOWEST DETECTABLE LIMITS     FOR URINE DRUG SCREEN     Drug Class  Cutoff (ng/mL)     Amphetamine      1000     Barbiturate      200     Benzodiazepine   161     Tricyclics       096     Opiates          300     Cocaine          300     THC              50  CBC WITH DIFFERENTIAL     Status: Abnormal   Collection Time    08/18/13  3:57 PM      Result Value Range   WBC 17.7 (*) 4.0 - 10.5 K/uL   RBC 3.66 (*) 3.87 - 5.11 MIL/uL   Hemoglobin 10.6 (*) 12.0 - 15.0 g/dL   HCT 34.4 (*) 36.0 - 46.0 %   MCV 94.0  78.0 - 100.0 fL   MCH 29.0  26.0 - 34.0 pg   MCHC 30.8  30.0 - 36.0 g/dL   RDW 13.3  11.5 - 15.5 %   Platelets 411 (*) 150 - 400 K/uL   Neutrophils Relative % 88 (*) 43 - 77 %   Lymphocytes Relative 8 (*) 12 - 46 %   Monocytes Relative 4  3 - 12 %   Eosinophils Relative 0  0 - 5 %   Basophils Relative 0  0 - 1 %   Neutro Abs 15.6 (*) 1.7 - 7.7 K/uL   Lymphs Abs 1.4  0.7 - 4.0 K/uL   Monocytes Absolute 0.7  0.1 - 1.0 K/uL   Eosinophils Absolute 0.0  0.0 - 0.7 K/uL   Basophils Absolute 0.0  0.0 - 0.1 K/uL  COMPREHENSIVE METABOLIC PANEL     Status: Abnormal   Collection Time    08/18/13  3:57 PM      Result Value Range   Sodium 140  137 - 147 mEq/L   Potassium 4.9  3.7 - 5.3 mEq/L   Chloride 99  96 - 112 mEq/L   CO2 30  19 - 32 mEq/L   Glucose, Bld  123 (*) 70 - 99 mg/dL   BUN 34 (*) 6 - 23 mg/dL   Creatinine, Ser 1.09  0.50 - 1.10 mg/dL   Calcium 9.8  8.4 - 10.5 mg/dL   Total Protein 7.4  6.0 - 8.3 g/dL   Albumin 3.4 (*) 3.5 - 5.2 g/dL   AST 15  0 - 37 U/L   ALT 6  0 - 35 U/L   Alkaline Phosphatase 66  39 - 117 U/L   Total Bilirubin 0.4  0.3 - 1.2 mg/dL   GFR calc non Af Amer 51 (*) >90 mL/min   GFR calc Af Amer 59 (*) >90 mL/min   Comment: (NOTE)     The eGFR has been calculated using the CKD EPI equation.     This calculation has not been validated in all clinical situations.     eGFR's persistently <90 mL/min signify possible Chronic Kidney     Disease.  GLUCOSE, CAPILLARY     Status: Abnormal   Collection Time    08/18/13  9:01 PM      Result Value Range   Glucose-Capillary 138 (*) 70 - 99 mg/dL   Comment 1 Notify RN     Comment 2 Documented in Chart    COMPREHENSIVE METABOLIC PANEL     Status: Abnormal  Collection Time    08/19/13  5:44 AM      Result Value Range   Sodium 144  137 - 147 mEq/L   Potassium 4.0  3.7 - 5.3 mEq/L   Comment: DELTA CHECK NOTED   Chloride 101  96 - 112 mEq/L   CO2 33 (*) 19 - 32 mEq/L   Glucose, Bld 84  70 - 99 mg/dL   BUN 28 (*) 6 - 23 mg/dL   Creatinine, Ser 1.22 (*) 0.50 - 1.10 mg/dL   Calcium 9.1  8.4 - 10.5 mg/dL   Total Protein 6.6  6.0 - 8.3 g/dL   Albumin 3.0 (*) 3.5 - 5.2 g/dL   AST 13  0 - 37 U/L   ALT 5  0 - 35 U/L   Alkaline Phosphatase 57  39 - 117 U/L   Total Bilirubin 0.5  0.3 - 1.2 mg/dL   GFR calc non Af Amer 45 (*) >90 mL/min   GFR calc Af Amer 52 (*) >90 mL/min   Comment: (NOTE)     The eGFR has been calculated using the CKD EPI equation.     This calculation has not been validated in all clinical situations.     eGFR's persistently <90 mL/min signify possible Chronic Kidney     Disease.  CBC     Status: Abnormal   Collection Time    08/19/13  5:44 AM      Result Value Range   WBC 16.1 (*) 4.0 - 10.5 K/uL   RBC 3.41 (*) 3.87 - 5.11 MIL/uL   Hemoglobin  10.0 (*) 12.0 - 15.0 g/dL   HCT 32.5 (*) 36.0 - 46.0 %   MCV 95.3  78.0 - 100.0 fL   MCH 29.3  26.0 - 34.0 pg   MCHC 30.8  30.0 - 36.0 g/dL   RDW 13.6  11.5 - 15.5 %   Platelets 336  150 - 400 K/uL  GLUCOSE, CAPILLARY     Status: Abnormal   Collection Time    08/19/13  8:54 AM      Result Value Range   Glucose-Capillary 112 (*) 70 - 99 mg/dL   Comment 1 Notify RN     Comment 2 Documented in Chart      Ct Head Wo Contrast  08/18/2013   CLINICAL DATA:  Altered mental status, question allergic reaction, history hypertension, diabetes, coronary artery disease, COPD  EXAM: CT HEAD WITHOUT CONTRAST  TECHNIQUE: Contiguous axial images were obtained from the base of the skull through the vertex without intravenous contrast.  COMPARISON:  06/05/2013  FINDINGS: Mild generalized atrophy.  Normal ventricular morphology.  No midline shift or mass effect.  Question old lacunar infarct versus prominent perivascular space at right basal ganglia, stable.  No intracranial hemorrhage, mass lesion or evidence of acute infarction.  No extra-axial fluid collections.  Bones and sinuses unremarkable.  IMPRESSION: No acute intracranial abnormalities.   Electronically Signed   By: Lavonia Dana M.D.   On: 08/18/2013 18:47        Khadijah Mastrianni A. Merlene Laughter, M.D.  Diplomate, [employer]iatry and Neurology ( Neurology). 08/19/2013, 10:21 AM

## 2013-08-20 ENCOUNTER — Other Ambulatory Visit: Payer: Self-pay

## 2013-08-20 DIAGNOSIS — I214 Non-ST elevation (NSTEMI) myocardial infarction: Secondary | ICD-10-CM

## 2013-08-20 DIAGNOSIS — R7989 Other specified abnormal findings of blood chemistry: Secondary | ICD-10-CM

## 2013-08-20 DIAGNOSIS — R778 Other specified abnormalities of plasma proteins: Secondary | ICD-10-CM

## 2013-08-20 DIAGNOSIS — I4892 Unspecified atrial flutter: Secondary | ICD-10-CM

## 2013-08-20 DIAGNOSIS — T7840XA Allergy, unspecified, initial encounter: Secondary | ICD-10-CM | POA: Diagnosis present

## 2013-08-20 LAB — TROPONIN I
Troponin I: 0.33 ng/mL (ref <0.30)
Troponin I: <0.3 ng/mL (ref <0.30)

## 2013-08-20 LAB — GLUCOSE, CAPILLARY
Glucose-Capillary: 101 mg/dL — ABNORMAL HIGH (ref 70–99)
Glucose-Capillary: 176 mg/dL — ABNORMAL HIGH (ref 70–99)
Glucose-Capillary: 99 mg/dL (ref 70–99)
Glucose-Capillary: 127 mg/dL — ABNORMAL HIGH (ref 70–99)

## 2013-08-20 MED ORDER — REGADENOSON 0.4 MG/5ML IV SOLN
0.4000 mg | Freq: Once | INTRAVENOUS | Status: AC
  Filled 2013-08-20: qty 5

## 2013-08-20 MED ORDER — ACETAMINOPHEN 325 MG PO TABS
650.0000 mg | ORAL_TABLET | Freq: Four times a day (QID) | ORAL | Status: DC | PRN
  Administered 2013-08-20 – 2013-08-23 (×8): 650 mg via ORAL
  Filled 2013-08-20 (×8): qty 2

## 2013-08-20 NOTE — Consult Note (Signed)
Reason for Consult:rapid atrial flutter Referring Physician: Tammee Thielke is an 68 y.o. female.  HPI: This is a 68 y.o. Female patient of Dr. Domenic Polite who was admitted with dehydration and headaches. After receiving prednisone for headaches she had a reaction with dizziness, diaphoresis, palpitation and tremors. She went into Atrial flutter at 147/m on was transferred to ICU and placed on a Cardizem drip. She does have new EKG changes with inferior, anterior lateral TW inversion. She is in NSR. Troponin elevated at 0.33.  She has a questionable history of atrial fibrillation and had a cardiac catheterization in 2008 with nonobstructive disease. She has COPD on home O2 and history of Diastolic heart failure, Type 2 DM and renal insufficiency. She has occasional palpitations at home, but nothing like what brought her in. She also has occasional chest tightness after eating but eases with belching and rubbing her chest.    Past Medical History  Diagnosis Date  . Type 2 diabetes mellitus   . Arthritis   . COPD (chronic obstructive pulmonary disease)     Home oxygen  . Palpitations     Reports history of atrial fibrillation, although none seen recently  . Coronary atherosclerosis of native coronary artery     Nonobstructive at catheterization 2008  . GERD (gastroesophageal reflux disease)   . Irritable bowel syndrome   . Chronic anemia   . Hypothyroidism   . Hyperlipidemia   . Chronic edema   . Pneumonia   . Diabetes mellitus   . Hypertension   . Chronic pain   . Anemia 08/04/2011  . Renal insufficiency 08/07/2011  . Anemia of chronic disease 08/04/2011  . Bulging lumbar disc     3  . Headache(784.0) 07/30/2013  . Sprain of neck 07/30/2013    Past Surgical History  Procedure Laterality Date  . Cholecystectomy    . Appendectomy    . Foot surgery      Family History  Problem Relation Age of Onset  . Hypertension    . Diabetes Brother   . Migraines Brother      Social History:  reports that she quit smoking about 17 years ago. Her smoking use included Cigarettes. She has a 48 pack-year smoking history. She has never used smokeless tobacco. She reports that she does not drink alcohol or use illicit drugs.  Allergies:  Allergies  Allergen Reactions  . Codeine Nausea And Vomiting and Rash  . Vioxx [Rofecoxib]     Medications: Scheduled Meds: . ALPRAZolam  1 mg Oral TID  . antiseptic oral rinse  15 mL Mouth Rinse BID  . citalopram  20 mg Oral BID  . fenofibrate  160 mg Oral QHS  . ferrous sulfate  325 mg Oral Q breakfast  . furosemide  40 mg Oral BID  . heparin  5,000 Units Subcutaneous Q8H  . insulin aspart  0-15 Units Subcutaneous TID WC  . insulin aspart  0-5 Units Subcutaneous QHS  . levothyroxine  100 mcg Oral QAC breakfast  . Linaclotide  145 mcg Oral Daily  . metFORMIN  1,000 mg Oral BID WC  . metoprolol tartrate  25 mg Oral QID  . morphine  30 mg Oral Q12H  . pantoprazole  40 mg Oral Daily  . senna  1 tablet Oral BID   Continuous Infusions: . sodium chloride 125 mL/hr at 08/20/13 0316  . diltiazem (CARDIZEM) infusion 7.5 mg/hr (08/20/13 0316)   PRN Meds:.LORazepam, LORazepam, nitroGLYCERIN, ondansetron (ZOFRAN) IV, ondansetron  Results for orders placed during the hospital encounter of 08/18/13 (from the past 48 hour(s))  URINALYSIS, ROUTINE W REFLEX MICROSCOPIC     Status: None   Collection Time    08/18/13  3:50 PM      Result Value Range   Color, Urine YELLOW  YELLOW   APPearance CLEAR  CLEAR   Specific Gravity, Urine 1.010  1.005 - 1.030   pH 7.5  5.0 - 8.0   Glucose, UA NEGATIVE  NEGATIVE mg/dL   Hgb urine dipstick NEGATIVE  NEGATIVE   Bilirubin Urine NEGATIVE  NEGATIVE   Ketones, ur NEGATIVE  NEGATIVE mg/dL   Protein, ur NEGATIVE  NEGATIVE mg/dL   Urobilinogen, UA 0.2  0.0 - 1.0 mg/dL   Nitrite NEGATIVE  NEGATIVE   Leukocytes, UA NEGATIVE  NEGATIVE   Comment: MICROSCOPIC NOT DONE ON URINES WITH NEGATIVE  PROTEIN, BLOOD, LEUKOCYTES, NITRITE, OR GLUCOSE <1000 mg/dL.  URINE RAPID DRUG SCREEN (HOSP PERFORMED)     Status: Abnormal   Collection Time    08/18/13  3:50 PM      Result Value Range   Opiates POSITIVE (*) NONE DETECTED   Cocaine NONE DETECTED  NONE DETECTED   Benzodiazepines POSITIVE (*) NONE DETECTED   Amphetamines NONE DETECTED  NONE DETECTED   Tetrahydrocannabinol NONE DETECTED  NONE DETECTED   Barbiturates NONE DETECTED  NONE DETECTED   Comment:            DRUG SCREEN FOR MEDICAL PURPOSES     ONLY.  IF CONFIRMATION IS NEEDED     FOR ANY PURPOSE, NOTIFY LAB     WITHIN 5 DAYS.                LOWEST DETECTABLE LIMITS     FOR URINE DRUG SCREEN     Drug Class       Cutoff (ng/mL)     Amphetamine      1000     Barbiturate      200     Benzodiazepine   258     Tricyclics       527     Opiates          300     Cocaine          300     THC              50  CBC WITH DIFFERENTIAL     Status: Abnormal   Collection Time    08/18/13  3:57 PM      Result Value Range   WBC 17.7 (*) 4.0 - 10.5 K/uL   RBC 3.66 (*) 3.87 - 5.11 MIL/uL   Hemoglobin 10.6 (*) 12.0 - 15.0 g/dL   HCT 34.4 (*) 36.0 - 46.0 %   MCV 94.0  78.0 - 100.0 fL   MCH 29.0  26.0 - 34.0 pg   MCHC 30.8  30.0 - 36.0 g/dL   RDW 13.3  11.5 - 15.5 %   Platelets 411 (*) 150 - 400 K/uL   Neutrophils Relative % 88 (*) 43 - 77 %   Lymphocytes Relative 8 (*) 12 - 46 %   Monocytes Relative 4  3 - 12 %   Eosinophils Relative 0  0 - 5 %   Basophils Relative 0  0 - 1 %   Neutro Abs 15.6 (*) 1.7 - 7.7 K/uL   Lymphs Abs 1.4  0.7 - 4.0 K/uL   Monocytes Absolute 0.7  0.1 -  1.0 K/uL   Eosinophils Absolute 0.0  0.0 - 0.7 K/uL   Basophils Absolute 0.0  0.0 - 0.1 K/uL  COMPREHENSIVE METABOLIC PANEL     Status: Abnormal   Collection Time    08/18/13  3:57 PM      Result Value Range   Sodium 140  137 - 147 mEq/L   Potassium 4.9  3.7 - 5.3 mEq/L   Chloride 99  96 - 112 mEq/L   CO2 30  19 - 32 mEq/L   Glucose, Bld 123 (*) 70 - 99  mg/dL   BUN 34 (*) 6 - 23 mg/dL   Creatinine, Ser 1.09  0.50 - 1.10 mg/dL   Calcium 9.8  8.4 - 10.5 mg/dL   Total Protein 7.4  6.0 - 8.3 g/dL   Albumin 3.4 (*) 3.5 - 5.2 g/dL   AST 15  0 - 37 U/L   ALT 6  0 - 35 U/L   Alkaline Phosphatase 66  39 - 117 U/L   Total Bilirubin 0.4  0.3 - 1.2 mg/dL   GFR calc non Af Amer 51 (*) >90 mL/min   GFR calc Af Amer 59 (*) >90 mL/min   Comment: (NOTE)     The eGFR has been calculated using the CKD EPI equation.     This calculation has not been validated in all clinical situations.     eGFR's persistently <90 mL/min signify possible Chronic Kidney     Disease.  GLUCOSE, CAPILLARY     Status: Abnormal   Collection Time    08/18/13  9:01 PM      Result Value Range   Glucose-Capillary 138 (*) 70 - 99 mg/dL   Comment 1 Notify RN     Comment 2 Documented in Chart    COMPREHENSIVE METABOLIC PANEL     Status: Abnormal   Collection Time    08/19/13  5:44 AM      Result Value Range   Sodium 144  137 - 147 mEq/L   Potassium 4.0  3.7 - 5.3 mEq/L   Comment: DELTA CHECK NOTED   Chloride 101  96 - 112 mEq/L   CO2 33 (*) 19 - 32 mEq/L   Glucose, Bld 84  70 - 99 mg/dL   BUN 28 (*) 6 - 23 mg/dL   Creatinine, Ser 1.22 (*) 0.50 - 1.10 mg/dL   Calcium 9.1  8.4 - 10.5 mg/dL   Total Protein 6.6  6.0 - 8.3 g/dL   Albumin 3.0 (*) 3.5 - 5.2 g/dL   AST 13  0 - 37 U/L   ALT 5  0 - 35 U/L   Alkaline Phosphatase 57  39 - 117 U/L   Total Bilirubin 0.5  0.3 - 1.2 mg/dL   GFR calc non Af Amer 45 (*) >90 mL/min   GFR calc Af Amer 52 (*) >90 mL/min   Comment: (NOTE)     The eGFR has been calculated using the CKD EPI equation.     This calculation has not been validated in all clinical situations.     eGFR's persistently <90 mL/min signify possible Chronic Kidney     Disease.  CBC     Status: Abnormal   Collection Time    08/19/13  5:44 AM      Result Value Range   WBC 16.1 (*) 4.0 - 10.5 K/uL   RBC 3.41 (*) 3.87 - 5.11 MIL/uL   Hemoglobin 10.0 (*) 12.0 -  15.0 g/dL  HCT 32.5 (*) 36.0 - 46.0 %   MCV 95.3  78.0 - 100.0 fL   MCH 29.3  26.0 - 34.0 pg   MCHC 30.8  30.0 - 36.0 g/dL   RDW 13.6  11.5 - 15.5 %   Platelets 336  150 - 400 K/uL  TSH     Status: None   Collection Time    08/19/13  5:44 AM      Result Value Range   TSH 1.249  0.350 - 4.500 uIU/mL   Comment: Performed at [facility]     Status: Abnormal   Collection Time    08/19/13  5:44 AM      Result Value Range   Sed Rate 35 (*) 0 - 22 mm/hr  GLUCOSE, CAPILLARY     Status: Abnormal   Collection Time    08/19/13  8:54 AM      Result Value Range   Glucose-Capillary 112 (*) 70 - 99 mg/dL   Comment 1 Notify RN     Comment 2 Documented in Chart    GLUCOSE, CAPILLARY     Status: None   Collection Time    08/19/13 12:50 PM      Result Value Range   Glucose-Capillary 98  70 - 99 mg/dL   Comment 1 Notify RN     Comment 2 Documented in Chart    GLUCOSE, CAPILLARY     Status: Abnormal   Collection Time    08/19/13  5:09 PM      Result Value Range   Glucose-Capillary 117 (*) 70 - 99 mg/dL   Comment 1 Notify RN     Comment 2 Documented in Chart    TROPONIN I     Status: Abnormal   Collection Time    08/19/13  6:13 PM      Result Value Range   Troponin I 0.30 (*) <0.30 ng/mL   Comment:            Due to the release kinetics of cTnI,     a negative result within the first hours     of the onset of symptoms does not rule out     myocardial infarction with certainty.     If myocardial infarction is still suspected,     repeat the test at appropriate intervals.     CRITICAL RESULT CALLED TO, READ BACK BY AND VERIFIED WITH:     JACOBS,S ON 08/19/13 AT 1910 BY LOY,C  MRSA PCR SCREENING     Status: None   Collection Time    08/19/13  9:08 PM      Result Value Range   MRSA by PCR NEGATIVE  NEGATIVE   Comment:            The GeneXpert MRSA Assay (FDA     approved for NASAL specimens     only), is one component of a     comprehensive MRSA  colonization     surveillance program. It is not     intended to diagnose MRSA     infection nor to guide or     monitor treatment for     MRSA infections.  GLUCOSE, CAPILLARY     Status: Abnormal   Collection Time    08/19/13  9:43 PM      Result Value Range   Glucose-Capillary 117 (*) 70 - 99 mg/dL  TROPONIN I     Status: Abnormal   Collection Time  08/19/13 11:14 PM      Result Value Range   Troponin I 0.33 (*) <0.30 ng/mL   Comment:            Due to the release kinetics of cTnI,     a negative result within the first hours     of the onset of symptoms does not rule out     myocardial infarction with certainty.     If myocardial infarction is still suspected,     repeat the test at appropriate intervals.     REPEAT WITH NEW VIAL OF CTRL     GRAY M AT 0003 ON [id number] BY FORSYTH K  TROPONIN I     Status: None   Collection Time    08/20/13  4:52 AM      Result Value Range   Troponin I <0.30  <0.30 ng/mL   Comment:            Due to the release kinetics of cTnI,     a negative result within the first hours     of the onset of symptoms does not rule out     myocardial infarction with certainty.     If myocardial infarction is still suspected,     repeat the test at appropriate intervals.  GLUCOSE, CAPILLARY     Status: Abnormal   Collection Time    08/20/13  8:03 AM      Result Value Range   Glucose-Capillary 101 (*) 70 - 99 mg/dL    Ct Head Wo Contrast  08/18/2013   CLINICAL DATA:  Altered mental status, question allergic reaction, history hypertension, diabetes, coronary artery disease, COPD  EXAM: CT HEAD WITHOUT CONTRAST  TECHNIQUE: Contiguous axial images were obtained from the base of the skull through the vertex without intravenous contrast.  COMPARISON:  06/05/2013  FINDINGS: Mild generalized atrophy.  Normal ventricular morphology.  No midline shift or mass effect.  Question old lacunar infarct versus prominent perivascular space at right basal ganglia, stable.   No intracranial hemorrhage, mass lesion or evidence of acute infarction.  No extra-axial fluid collections.  Bones and sinuses unremarkable.  IMPRESSION: No acute intracranial abnormalities.   Electronically Signed   By: Lavonia Dana M.D.   On: 08/18/2013 18:47    ROS See HPI Eyes: Negative Ears:Negative for hearing loss, tinnitus Cardiovascular:positive for chest pain, palpitations,irregular heartbeat, dyspnea, dyspnea on exertion, no near-syncope, orthopnea, paroxysmal nocturnal dyspnea and syncope,edema, claudication, cyanosis,.  Respiratory:  Home O2  Negative for cough, hemoptysis,  sleep disturbances due to breathing, sputum production and wheezing.   Endocrine: Negative for cold intolerance and heat intolerance.  Hematologic/Lymphatic: Negative for adenopathy and bleeding problem. Does not bruise/bleed easily.  Musculoskeletal: Negative.   Gastrointestinal: Positive reflux,Negative for nausea, vomiting, abdominal pain, diarrhea, constipation.   Genitourinary: Negative for bladder incontinence, dysuria, flank pain, frequency, hematuria, hesitancy, nocturia and urgency.  Neurological: Negative.  Allergic/Immunologic: Negative for environmental allergies.  Blood pressure 130/60, pulse 81, temperature 98.9 F (37.2 C), temperature source Oral, resp. rate 14, height 5' 6" (1.676 m), weight 154 lb 12.2 oz (70.2 kg), SpO2 100.00%. Physical Exam PHYSICAL EXAM: Well-nournished, in no acute distress. Neck: No JVD, HJR, Bruit, or thyroid enlargement Lungs: No tachypnea, clear without wheezing, rales, or rhonchi Cardiovascular: RRR, PMI not displaced, heart sounds normal, no murmurs, gallops, bruit, thrill, or heave. Abdomen: BS normal. Soft without organomegaly, masses, lesions or tenderness. Extremities: without cyanosis, clubbing or edema. Good distal pulses bilateral SKin: Warm,  no lesions or rashes  Musculoskeletal: No deformities Neuro: no focal signs  2Decho 02/2011: Study  Conclusions  - Left ventricle: The cavity size was at the upper limits of normal.   There was mild concentric hypertrophy. Systolic function was low   normal. The estimated ejection fraction was in the range of 50% to   55%. Wall motion was normal; there were no regional wall motion   abnormalities. - Aortic valve: Calcified annulus. Mild regurgitation. - Mitral valve: Calcified annulus. - Left atrium: The atrium was mildly dilated. - Right atrium: The atrium was mildly dilated. - Atrial septum: No defect or patent foramen ovale was identified. Impressions:  - Compared to the prior study performed 11/30/10, there has been no   significant interval change  FINDINGS:  Left main coronary artery was normal.    Left anterior descending artery was normal.    The first diagonal  was normal.  Second diagonal  was  normal.    Circumflex coronary artery was nondominant.  There was a high takeoff of  obtuse marginal  which was normal.  The mid circ had a 40-50%  smooth discrete stenosis.    Distal circ was normal.    The right coronary artery was dominant.  There were multiple RV  es.  The PDA itself was somewhat diminutive.  There was no  significant disease.    RAO ventriculography showed normal LV function.  EF was 60%.  There was  no gradient across the aortic valve.  No MR.  Aortic pressure was  130/62, LV pressure was 130/19, right heart catheterization was done due  to the significant dyspnea, COPD and to rule out pulmonary hypertension.  Mean right atrial pressure was 14, PA pressure was 43/22, mean PA  pressure was 32, pulmonary capillary wedge pressure was 80.    IMPRESSION:  The patient does not have significant coronary artery  disease to explain these stereotypical episodes of dyspnea and  diaphoresis.  They would not appear to be anginal equivalent.  There is  no evidence of pulmonary hypertension.  She would not benefit from  pulmonary  vasodilators.  She will continue to follow up with her primary  care MD in regards to her home O2 and emphysema.    I will see back in the office to check her groin in a week.  It is  encouraging that she does not have critical coronary disease.          Wallis Bamberg. Johnsie Cancel, MD, [facility]  Electronically Signed        PCN/MEDQ  D:  05/16/2007     Assessment/Plan: Atrial flutter after having an allergic reaction to Prednisone. Stop diltiazem drip. On metoprolol 25 mg QID    NSTEMI  Possibly secondary to stress ischemia? New EKG changes with TWI inferior, anterior laterally, and positive Troponin 0.33. Discuss possible cath vs Lexiscan with Dr. Harl Bowie. CHAD2 score is 3. Currently on heparin SQ. Consider long term anticoagulation. Order 2 Decho.   Nonobstructive CAD on cath 2008  COPD on home O2  Dehydration  Headaches  History of diastolic CHF  Diabetes Mellitus  Anemia of Chronic Disease.   Kara [name] 08/20/2013, 9:33 AM   Attending Note Patient seen and discussed with PA Bonnell Public, I agree with her above documentation. 68 yo female history of DM2, COPD, unclear history of possible afib, non-obstructive CAD admitted with dehydration and headaches. After receiving prednisone, she had episode of dizziness, diaphoresis, and palpitations, and tremors. Heart rate to  140s, EKG showed aflutter. She was transferred to ICU and started on dilt gtt for rate control. Trop 0.33 now negative, EKG after converted back to sinus rhythm showed sinus rhythm with ST depressions and T-wave inversions in the inferior and lateral leads looks most consistent with LVH with strain pattern, however these changes were not present on her Jan 26,2015 EKG. Prior cath 2008 non-obstructive disease. Echo pending. She reports only atypical chest pain over the last several months, typically brought on by eating, better with belching and passing gas. Denies any significant palpitations, only occasional feeling of heart  racing for a few minutes. Unclear if aflutter brought on in acute setting of drug reaction or if having intermittently overtime. Recommend home event monitor to further evaluate prior to committing to any long term anticoagulation. Unclear if EKG changes and very mild troponin stress induced ischemia or if suggests underlying obstructive CAD, will obtain Lexiscan to further evaluate  Carlyle Dolly MD

## 2013-08-20 NOTE — Progress Notes (Signed)
Patient ID: Kara [name], female   DOB: 1946-04-17, 68 y.o.   MRN: [id number]  Winter A. Merlene Laughter, MD     [email or web address]          Kara [name] is an 68 y.o. female.   Assessment/Plan: 1. Likely medication effect presented with dizziness, diaphoresis, palpitation and tremors. The effect is most likely from prednisone. This has been appropriately discontinued.  2. Recalcitrant headache of unclear etiology. The patient refuses MRI testing because of being claustrophobia. This has been attempted several times previously. The patient does have a baseline history of episodic primary headaches however and this current headache could represent transformed migraine headaches from chronic opioid therapy and possible chronic medication overuse.  3. Chronic pain syndrome on chronic opioids.  4. Arrhythmia of unclear etiology. This is being evaluated by cardiology.  The patient reports that she is improved. She indicates that her headaches are not as severe as previously. She graded as 7/10. Tremors and shaking has improved. She did have an episode of palpitation and tachycardia on yesterday with a heart rate of 140. There is a question of possible atrial fibrillation/A. Fib. This is being evaluated by cardiology.  GENERAL: She is a lot less calm today. She is clearly improved.  HEENT: Supple. Atraumatic normocephalic.  ABDOMEN: soft  EXTREMITIES: No edema. No tremors or shaking is seen. BACK: Normal.  SKIN: Normal by inspection.  MENTAL STATUS: Alert and oriented. Speech, language and cognition are generally intact. Judgment and insight normal.  MOTOR: Normal tone, bulk and strength In the upper extremities; no pronator drift. Hip flexion is 4/5 bilaterally. Dorsiflexion 5.  COORDINATION: Left finger to nose is normal, right finger to nose is normal, No rest tremor; no intention tremor; no postural tremor; no bradykinesia.  GAIT: Normal.      Objective: Vital  signs in last 24 hours: Temp:  [98.4 F (36.9 C)-99.8 F (37.7 C)] 98.7 F (37.1 C) (01/28 0400) Pulse Rate:  [71-100] 73 (01/28 0500) Resp:  [14-21] 14 (01/28 0500) BP: (84-124)/(46-100) 117/47 mmHg (01/28 0500) SpO2:  [99 %-100 %] 100 % (01/28 0500) Weight:  [70.2 kg (154 lb 12.2 oz)] 70.2 kg (154 lb 12.2 oz) (01/28 0500)  Intake/Output from previous day: 01/27 0701 - 01/28 0700 In: 680 [P.O.:680] Out: 1425 [Urine:1425] Intake/Output this shift:   Nutritional status: Carb Control   Lab Results: Results for orders placed during the hospital encounter of 08/18/13 (from the past 48 hour(s))  URINALYSIS, ROUTINE W REFLEX MICROSCOPIC     Status: None   Collection Time    08/18/13  3:50 PM      Result Value Range   Color, Urine YELLOW  YELLOW   APPearance CLEAR  CLEAR   Specific Gravity, Urine 1.010  1.005 - 1.030   pH 7.5  5.0 - 8.0   Glucose, UA NEGATIVE  NEGATIVE mg/dL   Hgb urine dipstick NEGATIVE  NEGATIVE   Bilirubin Urine NEGATIVE  NEGATIVE   Ketones, ur NEGATIVE  NEGATIVE mg/dL   Protein, ur NEGATIVE  NEGATIVE mg/dL   Urobilinogen, UA 0.2  0.0 - 1.0 mg/dL   Nitrite NEGATIVE  NEGATIVE   Leukocytes, UA NEGATIVE  NEGATIVE   Comment: MICROSCOPIC NOT DONE ON URINES WITH NEGATIVE PROTEIN, BLOOD, LEUKOCYTES, NITRITE, OR GLUCOSE <1000 mg/dL.  URINE RAPID DRUG SCREEN (HOSP PERFORMED)     Status: Abnormal   Collection Time    08/18/13  3:50 PM      Result Value Range  Opiates POSITIVE (*) NONE DETECTED   Cocaine NONE DETECTED  NONE DETECTED   Benzodiazepines POSITIVE (*) NONE DETECTED   Amphetamines NONE DETECTED  NONE DETECTED   Tetrahydrocannabinol NONE DETECTED  NONE DETECTED   Barbiturates NONE DETECTED  NONE DETECTED   Comment:            DRUG SCREEN FOR MEDICAL PURPOSES     ONLY.  IF CONFIRMATION IS NEEDED     FOR ANY PURPOSE, NOTIFY LAB     WITHIN 5 DAYS.                LOWEST DETECTABLE LIMITS     FOR URINE DRUG SCREEN     Drug Class       Cutoff ([name]/mL)      Amphetamine      1000     Barbiturate      200     Benzodiazepine   413     Tricyclics       244     Opiates          300     Cocaine          300     THC              50  CBC WITH DIFFERENTIAL     Status: Abnormal   Collection Time    08/18/13  3:57 PM      Result Value Range   WBC 17.7 (*) 4.0 - 10.5 K/uL   RBC 3.66 (*) 3.87 - 5.11 MIL/uL   Hemoglobin 10.6 (*) 12.0 - 15.0 g/dL   HCT 34.4 (*) 36.0 - 46.0 %   MCV 94.0  78.0 - 100.0 fL   MCH 29.0  26.0 - 34.0 pg   MCHC 30.8  30.0 - 36.0 g/dL   RDW 13.3  11.5 - 15.5 %   Platelets 411 (*) 150 - 400 K/uL   Neutrophils Relative % 88 (*) 43 - 77 %   Lymphocytes Relative 8 (*) 12 - 46 %   Monocytes Relative 4  3 - 12 %   Eosinophils Relative 0  0 - 5 %   Basophils Relative 0  0 - 1 %   Neutro Abs 15.6 (*) 1.7 - 7.7 K/uL   Lymphs Abs 1.4  0.7 - 4.0 K/uL   Monocytes Absolute 0.7  0.1 - 1.0 K/uL   Eosinophils Absolute 0.0  0.0 - 0.7 K/uL   Basophils Absolute 0.0  0.0 - 0.1 K/uL  COMPREHENSIVE METABOLIC PANEL     Status: Abnormal   Collection Time    08/18/13  3:57 PM      Result Value Range   Sodium 140  137 - 147 mEq/L   Potassium 4.9  3.7 - 5.3 mEq/L   Chloride 99  96 - 112 mEq/L   CO2 30  19 - 32 mEq/L   Glucose, Bld 123 (*) 70 - 99 mg/dL   BUN 34 (*) 6 - 23 mg/dL   Creatinine, Ser 1.09  0.50 - 1.10 mg/dL   Calcium 9.8  8.4 - 10.5 mg/dL   Total Protein 7.4  6.0 - 8.3 g/dL   Albumin 3.4 (*) 3.5 - 5.2 g/dL   AST 15  0 - 37 U/L   ALT 6  0 - 35 U/L   Alkaline Phosphatase 66  39 - 117 U/L   Total Bilirubin 0.4  0.3 - 1.2 mg/dL   GFR calc non Af Amer 51 (*) >  90 mL/min   GFR calc Af Amer 59 (*) >90 mL/min   Comment: (NOTE)     The eGFR has been calculated using the CKD EPI equation.     This calculation has not been validated in all clinical situations.     eGFR's persistently <90 mL/min signify possible Chronic Kidney     Disease.  GLUCOSE, CAPILLARY     Status: Abnormal   Collection Time    08/18/13  9:01 PM       Result Value Range   Glucose-Capillary 138 (*) 70 - 99 mg/dL   Comment 1 Notify RN     Comment 2 Documented in Chart    COMPREHENSIVE METABOLIC PANEL     Status: Abnormal   Collection Time    08/19/13  5:44 AM      Result Value Range   Sodium 144  137 - 147 mEq/L   Potassium 4.0  3.7 - 5.3 mEq/L   Comment: DELTA CHECK NOTED   Chloride 101  96 - 112 mEq/L   CO2 33 (*) 19 - 32 mEq/L   Glucose, Bld 84  70 - 99 mg/dL   BUN 28 (*) 6 - 23 mg/dL   Creatinine, Ser 1.22 (*) 0.50 - 1.10 mg/dL   Calcium 9.1  8.4 - 10.5 mg/dL   Total Protein 6.6  6.0 - 8.3 g/dL   Albumin 3.0 (*) 3.5 - 5.2 g/dL   AST 13  0 - 37 U/L   ALT 5  0 - 35 U/L   Alkaline Phosphatase 57  39 - 117 U/L   Total Bilirubin 0.5  0.3 - 1.2 mg/dL   GFR calc non Af Amer 45 (*) >90 mL/min   GFR calc Af Amer 52 (*) >90 mL/min   Comment: (NOTE)     The eGFR has been calculated using the CKD EPI equation.     This calculation has not been validated in all clinical situations.     eGFR's persistently <90 mL/min signify possible Chronic Kidney     Disease.  CBC     Status: Abnormal   Collection Time    08/19/13  5:44 AM      Result Value Range   WBC 16.1 (*) 4.0 - 10.5 K/uL   RBC 3.41 (*) 3.87 - 5.11 MIL/uL   Hemoglobin 10.0 (*) 12.0 - 15.0 g/dL   HCT 32.5 (*) 36.0 - 46.0 %   MCV 95.3  78.0 - 100.0 fL   MCH 29.3  26.0 - 34.0 pg   MCHC 30.8  30.0 - 36.0 g/dL   RDW 13.6  11.5 - 15.5 %   Platelets 336  150 - 400 K/uL  TSH     Status: None   Collection Time    08/19/13  5:44 AM      Result Value Range   TSH 1.249  0.350 - 4.500 uIU/mL   Comment: Performed at [place]     Status: Abnormal   Collection Time    08/19/13  5:44 AM      Result Value Range   Sed Rate 35 (*) 0 - 22 mm/hr  GLUCOSE, CAPILLARY     Status: Abnormal   Collection Time    08/19/13  8:54 AM      Result Value Range   Glucose-Capillary 112 (*) 70 - 99 mg/dL   Comment 1 Notify RN     Comment 2 Documented in Chart      GLUCOSE,  CAPILLARY     Status: None   Collection Time    08/19/13 12:50 PM      Result Value Range   Glucose-Capillary 98  70 - 99 mg/dL   Comment 1 Notify RN     Comment 2 Documented in Chart    GLUCOSE, CAPILLARY     Status: Abnormal   Collection Time    08/19/13  5:09 PM      Result Value Range   Glucose-Capillary 117 (*) 70 - 99 mg/dL   Comment 1 Notify RN     Comment 2 Documented in Chart    TROPONIN I     Status: Abnormal   Collection Time    08/19/13  6:13 PM      Result Value Range   Troponin I 0.30 (*) <0.30 [name]/mL   Comment:            Due to the release kinetics of cTnI,     a negative result within the first hours     of the onset of symptoms does not rule out     myocardial infarction with certainty.     If myocardial infarction is still suspected,     repeat the test at appropriate intervals.     CRITICAL RESULT CALLED TO, READ BACK BY AND VERIFIED WITH:     JACOBS,S ON 08/19/13 AT 1910 BY LOY,C  MRSA PCR SCREENING     Status: None   Collection Time    08/19/13  9:08 PM      Result Value Range   MRSA by PCR NEGATIVE  NEGATIVE   Comment:            The GeneXpert MRSA Assay (FDA     approved for NASAL specimens     only), is one component of a     comprehensive MRSA colonization     surveillance program. It is not     intended to diagnose MRSA     infection nor to guide or     monitor treatment for     MRSA infections.  GLUCOSE, CAPILLARY     Status: Abnormal   Collection Time    08/19/13  9:43 PM      Result Value Range   Glucose-Capillary 117 (*) 70 - 99 mg/dL  TROPONIN I     Status: Abnormal   Collection Time    08/19/13 11:14 PM      Result Value Range   Troponin I 0.33 (*) <0.30 [name]/mL   Comment:            Due to the release kinetics of cTnI,     a negative result within the first hours     of the onset of symptoms does not rule out     myocardial infarction with certainty.     If myocardial infarction is still suspected,     repeat the test  at appropriate intervals.     REPEAT WITH NEW VIAL OF CTRL     GRAY M AT 0003 ON [id number] BY FORSYTH K  TROPONIN I     Status: None   Collection Time    08/20/13  4:52 AM      Result Value Range   Troponin I <0.30  <0.30 [name]/mL   Comment:            Due to the release kinetics of cTnI,     a negative result within the first hours  of the onset of symptoms does not rule out     myocardial infarction with certainty.     If myocardial infarction is still suspected,     repeat the test at appropriate intervals.  GLUCOSE, CAPILLARY     Status: Abnormal   Collection Time    08/20/13  8:03 AM      Result Value Range   Glucose-Capillary 101 (*) 70 - 99 mg/dL    Lipid Panel No results found for this basename: CHOL, TRIG, HDL, CHOLHDL, VLDL, LDLCALC,  in the last 72 hours  Studies/Results: Ct Head Wo Contrast  08/18/2013   CLINICAL DATA:  Altered mental status, question allergic reaction, history hypertension, diabetes, coronary artery disease, COPD  EXAM: CT HEAD WITHOUT CONTRAST  TECHNIQUE: Contiguous axial images were obtained from the base of the skull through the vertex without intravenous contrast.  COMPARISON:  06/05/2013  FINDINGS: Mild generalized atrophy.  Normal ventricular morphology.  No midline shift or mass effect.  Question old lacunar infarct versus prominent perivascular space at right basal ganglia, stable.  No intracranial hemorrhage, mass lesion or evidence of acute infarction.  No extra-axial fluid collections.  Bones and sinuses unremarkable.  IMPRESSION: No acute intracranial abnormalities.   Electronically Signed   By: Lavonia Dana M.D.   On: 08/18/2013 18:47    Medications:  Scheduled Meds: . ALPRAZolam  1 mg Oral TID  . antiseptic oral rinse  15 mL Mouth Rinse BID  . citalopram  20 mg Oral BID  . fenofibrate  160 mg Oral QHS  . ferrous sulfate  325 mg Oral Q breakfast  . furosemide  40 mg Oral BID  . heparin  5,000 Units Subcutaneous Q8H  . insulin aspart  0-15  Units Subcutaneous TID WC  . insulin aspart  0-5 Units Subcutaneous QHS  . levothyroxine  100 mcg Oral QAC breakfast  . Linaclotide  145 mcg Oral Daily  . metFORMIN  1,000 mg Oral BID WC  . metoprolol tartrate  25 mg Oral QID  . morphine  30 mg Oral Q12H  . pantoprazole  40 mg Oral Daily  . senna  1 tablet Oral BID   Continuous Infusions: . sodium chloride 125 mL/hr at 08/20/13 0316  . diltiazem (CARDIZEM) infusion 7.5 mg/hr (08/20/13 0316)   PRN Meds:.LORazepam, LORazepam, nitroGLYCERIN, ondansetron (ZOFRAN) IV, ondansetron     LOS: 2 days   Lynnex Fulp A. Merlene Laughter, M.D.  Diplomate, Tax adviser of Psychiatry and Neurology ( Neurology).

## 2013-08-20 NOTE — Progress Notes (Signed)
Kara [name]:  Kara [name]              ACCOUNT NO.:  [id number]  MEDICAL RECORD NO.:  [id number]  LOCATION:  IC06                          FACILITY:  [facility]  PHYSICIAN:  Mechelle Pates G. [name], MD   DATE OF BIRTH:  [birth year]  DATE OF PROCEDURE: DATE OF DISCHARGE:                                PROGRESS NOTE   SUBJECTIVE:  This patient was more alert this morning.  States that she still has headache, but it has improved.  She was admitted with what was thought to be clinical dehydration and was given intravenous fluids. She apparently has had symptoms of burning inside and shaking of the extremities since being started on prednisone by Neurology.  She apparently had been on MS Contin 3 times a day and had developed some twitching in her body.  OBJECTIVE:  VITAL SIGNS:  Blood pressure 137/66, respirations 20, pulse 103, and temperature 98.3. HEENT:  Eyes PERRLA.  TMs negative.  Oropharynx benign. NECK:  Supple.  No JVD or thyroid abnormalities. HEART:  Regular rhythm.  No murmurs. LUNGS:  Clear to P and A. ABDOMEN:  No palpable organs or masses. NEUROLOGICAL:  No focal neurological signs.  ASSESSMENT:  The patient is felt to be dehydrated.  She has had chronic headaches, which could be post concussion headache.  She does have stable chronic obstructive pulmonary disease, and hypertension that is controlled, hypothyroidism, and anemia of chronic disease.  PLAN:  To continue IV fluids.  Discontinue steroids.  We will obtain Neurology consult.     Brenlee Koskela G. [name], MD     AGM/MEDQ  D:  08/19/2013  T:  08/20/2013  Job:  [id number]

## 2013-08-20 NOTE — Progress Notes (Signed)
PT Cancellation Note  Patient Details Name: Kara [name] MRN: [id number] DOB: 12/25/1945   Cancelled Treatment:    Reason Eval/Treat Not Completed: Fatigue/lethargy limiting ability to participate. Patient declined therapy to being too tired and that she had just finished lunch and needed to sleep. Patient agree to be seen tomorrow am.   Waylan RocherAGLETON, Sabri Teal ATKINSO 08/20/2013, 2:59 PM

## 2013-08-21 ENCOUNTER — Inpatient Hospital Stay (HOSPITAL_COMMUNITY): Payer: Medicare Other

## 2013-08-21 ENCOUNTER — Encounter (HOSPITAL_COMMUNITY): Payer: Self-pay

## 2013-08-21 DIAGNOSIS — I319 Disease of pericardium, unspecified: Secondary | ICD-10-CM

## 2013-08-21 DIAGNOSIS — R079 Chest pain, unspecified: Secondary | ICD-10-CM

## 2013-08-21 LAB — GLUCOSE, CAPILLARY
GLUCOSE-CAPILLARY: 120 mg/dL — AB (ref 70–99)
Glucose-Capillary: 129 mg/dL — ABNORMAL HIGH (ref 70–99)
Glucose-Capillary: 138 mg/dL — ABNORMAL HIGH (ref 70–99)

## 2013-08-21 MED ORDER — TECHNETIUM TC 99M SESTAMIBI - CARDIOLITE
30.0000 | Freq: Once | INTRAVENOUS | Status: AC | PRN
  Administered 2013-08-21: 30 via INTRAVENOUS

## 2013-08-21 MED ORDER — TECHNETIUM TC 99M SESTAMIBI - CARDIOLITE
10.0000 | Freq: Once | INTRAVENOUS | Status: AC | PRN
  Administered 2013-08-21: 08:00:00 10 via INTRAVENOUS

## 2013-08-21 MED ORDER — REGADENOSON 0.4 MG/5ML IV SOLN
INTRAVENOUS | Status: AC
  Administered 2013-08-21: 0.4 mg via INTRAVENOUS
  Filled 2013-08-21: qty 5

## 2013-08-21 MED ORDER — METOPROLOL TARTRATE 50 MG PO TABS
50.0000 mg | ORAL_TABLET | Freq: Two times a day (BID) | ORAL | Status: DC
  Administered 2013-08-22: 50 mg via ORAL
  Filled 2013-08-21: qty 1

## 2013-08-21 MED ORDER — SODIUM CHLORIDE 0.9 % IJ SOLN
INTRAMUSCULAR | Status: AC
  Administered 2013-08-21: 10 mL via INTRAVENOUS
  Filled 2013-08-21: qty 10

## 2013-08-21 NOTE — Progress Notes (Signed)
Patient ID: DAJANEE VOORHEIS, female   DOB: 1945-08-05, 68 y.o.   MRN: [id number]    Subjective:    No complaints overnight   Objective:   Temp:  [98.6 F (37 C)-99.4 F (37.4 C)] 98.6 F (37 C) (01/29 0400) Pulse Rate:  [63-88] 72 (01/29 0700) Resp:  [9-23] 21 (01/29 0700) BP: (65-138)/(41-77) 94/42 mmHg (01/29 0700) SpO2:  [96 %-100 %] 100 % (01/29 0700) Weight:  [157 lb 3 oz (71.3 kg)] 157 lb 3 oz (71.3 kg) (01/29 0500) Last BM Date: 08/20/13  Filed Weights   08/18/13 1859 08/20/13 0500 08/21/13 0500  Weight: 157 lb 10.1 oz (71.5 kg) 154 lb 12.2 oz (70.2 kg) 157 lb 3 oz (71.3 kg)    Intake/Output Summary (Last 24 hours) at 08/21/13 0756 Last data filed at 08/21/13 0200  Gross per 24 hour  Intake 506.54 ml  Output    750 ml  Net -243.46 ml    Telemetry: sinus rhythm  Exam:  General: NAD  Resp: CTAB  Cardiac: RRR, no m/r/g, no JVD  GI: abdomen soft, NT, ND  MSK: extremities are warm, no edema  Neuro: no focal deficits   Lab Results:  Basic Metabolic Panel:  Recent Labs Lab 08/18/13 1557 08/19/13 0544  NA 140 144  K 4.9 4.0  CL 99 101  CO2 30 33*  GLUCOSE 123* 84  BUN 34* 28*  CREATININE 1.09 1.22*  CALCIUM 9.8 9.1    Liver Function Tests:  Recent Labs Lab 08/18/13 1557 08/19/13 0544  AST 15 13  ALT 6 5  ALKPHOS 66 57  BILITOT 0.4 0.5  PROT 7.4 6.6  ALBUMIN 3.4* 3.0*    CBC:  Recent Labs Lab 08/18/13 1557 08/19/13 0544  WBC 17.7* 16.1*  HGB 10.6* 10.0*  HCT 34.4* 32.5*  MCV 94.0 95.3  PLT 411* 336    Cardiac Enzymes:  Recent Labs Lab 08/19/13 1813 08/19/13 2314 08/20/13 0452  TROPONINI 0.30* 0.33* <0.30    BNP: No results found for this basename: PROBNP,  in the last 8760 hours  Coagulation: No results found for this basename: INR,  in the last 168 hours  ECG:   Medications:   Scheduled Medications: . ALPRAZolam  1 mg Oral TID  . antiseptic oral rinse  15 mL Mouth Rinse BID  . citalopram  20 mg Oral  BID  . fenofibrate  160 mg Oral QHS  . ferrous sulfate  325 mg Oral Q breakfast  . furosemide  40 mg Oral BID  . heparin  5,000 Units Subcutaneous Q8H  . insulin aspart  0-15 Units Subcutaneous TID WC  . insulin aspart  0-5 Units Subcutaneous QHS  . levothyroxine  100 mcg Oral QAC breakfast  . Linaclotide  145 mcg Oral Daily  . metFORMIN  1,000 mg Oral BID WC  . metoprolol tartrate  25 mg Oral QID  . morphine  30 mg Oral Q12H  . pantoprazole  40 mg Oral Daily  . regadenoson  0.4 mg Intravenous Once  . senna  1 tablet Oral BID     Infusions:     PRN Medications:  acetaminophen, LORazepam, LORazepam, nitroGLYCERIN, ondansetron (ZOFRAN) IV, ondansetron, technetium sestamibi     Assessment/Plan    1. Aflutter - transient episode after systemic reaction to prednisone, Converted back to NSR with dilt gtt.  - CHADS2 score is 3, it is unclear if she has recurrent aflutter or this was related to her severe systemic reaction to prednisone. She  denies significant prior palpitations. TSH 1.25.  - change metoprolol to 50mg  bid  - will not start anticoag at this point, will need home event monitor to follow up for recurrence of flutter  2. Mild troponin elevation in setting of tachycardia very mild trop 0.33 that has since resolved with ST depressions inferior and lateral. No chest pain. - has had only non-cardiac chest pain at home, relieved with belching and passing gas - Lexiscan pending - echo pending        Dina RichJonathan Hue Steveson, M.D., F.A.C.C.

## 2013-08-21 NOTE — CV Procedure (Signed)
Echocardiogram (report did not come through in EPIC so copied to chart - already called to nursing)  ? Left ventricle: The cavity size was normal. Wall thickness was increased in a pattern of moderate LVH. Systolic function was moderately reduced. The estimated ejection fraction was 40%. Some views suggest better systolic function. Diffuse hypokinesis. Probable hypokinesis of the basal inferolateral myocardium. Doppler parameters are consistent with abnormal left ventricular relaxation (grade 1 diastolic dysfunction). ? Aortic valve: Mildly calcified annulus. Trileaflet. Mild regurgitation. Mean gradient: 7 mm Hg (S). ? Aortic root: The aortic root was mildly dilated. There is a mobile, linear echodensity noted within the proximal aortic arch that is concerning for a dissection flap, although not seen except in parasternal long axis view. Mid to distal aortic arch does not show similar finding, therefore cannot exclude arifact. If there is any clinical concern for possibility of aortic dissection, would proceed with further imaging such as chest CTA. ? Mitral valve: Calcified annulus. Mild regurgitation. ? Left atrium: The atrium was mildly dilated. ? Right atrium: Central venous pressure: 3 mm Hg (est). ? Atrial septum: No defect or patent foramen ovale was identified. ? Tricuspid valve: Mild regurgitation. ? Pulmonary arteries: PA peak pressure: 37 mm Hg (S). ? Pericardium, extracardiac: A small pericardial effusion was identified posterior to the heart.  ? Moderate LVH with LVEF approximately 40% (looks better in some views), also possible basal inferolateral hypokinesis. grade 1 diastolic dysfunction. Mild left atrial enlargement, MAC with mild mitral regurgitation. Mild tricuspid regurgiitation with PASP 37 mmHg. Small posterior pericardial effusion. Mildly dilated aortic root with concern for possible dissection flap in proximal aortic arch as discussed above. If there is any clinical  possibility of an aortic dissection, would suggest further imaging to clarify this finding such as chest CTA. Results called to floor at 9:00 PM on 08/21/13.

## 2013-08-21 NOTE — Progress Notes (Signed)
Patient ID: Kara [name], female   DOB: 1945-12-21, 68 y.o.   MRN: [id number]  [facility] NEUROLOGY Arhaan Chesnut A. Gerilyn Pilgrim, MD     [email or web address]          Kara [name] is an 68 y.o. female.   Assessment/Plan: 1. Likely medication effect presented with dizziness, diaphoresis, palpitation and tremors. The effect is most likely from prednisone. This has been appropriately discontinued.  2. Recalcitrant headache of unclear etiology. The patient refuses MRI testing because of being claustrophobia. This has been attempted several times previously. The patient does have a baseline history of episodic primary headaches however and this current headache could represent transformed migraine headaches from chronic opioid therapy and possible chronic medication overuse. Consider prophylactic HA meds and botox HA prophylaxis.  3. Chronic pain syndrome on chronic opioids.  4. Arrhythmia of unclear etiology. This is being evaluated by cardiology.  Transient flutter. Possibly related to pred. Unclear risk of recurrence. Getting stress echo now.     The patient reports that she is improved. She indicates that her headaches are not as severe as previously- better still today. She graded as 7/10. Tremors and shaking has improved. NO palpitation today. CO low back today 7/10. On prn Pain med.  GENERAL: She is a lot less calm today. She is clearly improved.  HEENT: Supple. Atraumatic normocephalic.  ABDOMEN: soft  EXTREMITIES: No edema. No tremors or shaking is seen.  BACK: Normal.  SKIN: Normal by inspection.  MENTAL STATUS: Alert and oriented. Speech, language and cognition are generally intact. Judgment and insight normal.  MOTOR: Normal tone, bulk and strength In the upper extremities; no pronator drift. Hip flexion is 4/5 bilaterally. Dorsiflexion 5.  COORDINATION: Left finger to nose is normal, right finger to nose is normal, No rest tremor; no intention tremor; no postural tremor; no  bradykinesia.  GAIT: Normal.      Objective: Vital signs in last 24 hours: Temp:  [98.6 F (37 C)-99.4 F (37.4 C)] 98.6 F (37 C) (01/29 0400) Pulse Rate:  [63-87] 79 (01/29 0800) Resp:  [9-23] 17 (01/29 0800) BP: (65-138)/(41-77) 126/56 mmHg (01/29 0800) SpO2:  [96 %-100 %] 100 % (01/29 0800) Weight:  [71.3 kg (157 lb 3 oz)] 71.3 kg (157 lb 3 oz) (01/29 0500)  Intake/Output from previous day: 01/28 0701 - 01/29 0700 In: 506.5 [P.O.:480; I.V.:26.5] Out: 750 [Urine:750] Intake/Output this shift:   Nutritional status: NPO   Lab Results: Results for orders placed during the hospital encounter of 08/18/13 (from the past 48 hour(s))  GLUCOSE, CAPILLARY     Status: Abnormal   Collection Time    08/19/13  8:54 AM      Result Value Range   Glucose-Capillary 112 (*) 70 - 99 mg/dL   Comment 1 Notify RN     Comment 2 Documented in Chart    GLUCOSE, CAPILLARY     Status: None   Collection Time    08/19/13 12:50 PM      Result Value Range   Glucose-Capillary 98  70 - 99 mg/dL   Comment 1 Notify RN     Comment 2 Documented in Chart    GLUCOSE, CAPILLARY     Status: Abnormal   Collection Time    08/19/13  5:09 PM      Result Value Range   Glucose-Capillary 117 (*) 70 - 99 mg/dL   Comment 1 Notify RN     Comment 2 Documented in Chart    TROPONIN I  Status: Abnormal   Collection Time    08/19/13  6:13 PM      Result Value Range   Troponin I 0.30 (*) <0.30 ng/mL   Comment:            Due to the release kinetics of cTnI,     a negative result within the first hours     of the onset of symptoms does not rule out     myocardial infarction with certainty.     If myocardial infarction is still suspected,     repeat the test at appropriate intervals.     CRITICAL RESULT CALLED TO, READ BACK BY AND VERIFIED WITH:     JACOBS,S ON 08/19/13 AT 1910 BY LOY,C  MRSA PCR SCREENING     Status: None   Collection Time    08/19/13  9:08 PM      Result Value Range   MRSA by PCR  NEGATIVE  NEGATIVE   Comment:            The GeneXpert MRSA Assay (FDA     approved for NASAL specimens     only), is one component of a     comprehensive MRSA colonization     surveillance program. It is not     intended to diagnose MRSA     infection nor to guide or     monitor treatment for     MRSA infections.  GLUCOSE, CAPILLARY     Status: Abnormal   Collection Time    08/19/13  9:43 PM      Result Value Range   Glucose-Capillary 117 (*) 70 - 99 mg/dL  TROPONIN I     Status: Abnormal   Collection Time    08/19/13 11:14 PM      Result Value Range   Troponin I 0.33 (*) <0.30 ng/mL   Comment:            Due to the release kinetics of cTnI,     a negative result within the first hours     of the onset of symptoms does not rule out     myocardial infarction with certainty.     If myocardial infarction is still suspected,     repeat the test at appropriate intervals.     REPEAT WITH NEW VIAL OF CTRL     GRAY M AT 0003 ON [id number] BY FORSYTH K  TROPONIN I     Status: None   Collection Time    08/20/13  4:52 AM      Result Value Range   Troponin I <0.30  <0.30 ng/mL   Comment:            Due to the release kinetics of cTnI,     a negative result within the first hours     of the onset of symptoms does not rule out     myocardial infarction with certainty.     If myocardial infarction is still suspected,     repeat the test at appropriate intervals.  GLUCOSE, CAPILLARY     Status: Abnormal   Collection Time    08/20/13  8:03 AM      Result Value Range   Glucose-Capillary 101 (*) 70 - 99 mg/dL  GLUCOSE, CAPILLARY     Status: Abnormal   Collection Time    08/20/13 11:22 AM      Result Value Range   Glucose-Capillary 176 (*) 70 - 99 mg/dL  GLUCOSE,  CAPILLARY     Status: None   Collection Time    08/20/13  4:16 PM      Result Value Range   Glucose-Capillary 99  70 - 99 mg/dL  GLUCOSE, CAPILLARY     Status: Abnormal   Collection Time    08/20/13 10:21 PM      Result  Value Range   Glucose-Capillary 127 (*) 70 - 99 mg/dL   Comment 1 Notify RN    GLUCOSE, CAPILLARY     Status: Abnormal   Collection Time    08/21/13  7:35 AM      Result Value Range   Glucose-Capillary 129 (*) 70 - 99 mg/dL    Lipid Panel No results found for this basename: CHOL, TRIG, HDL, CHOLHDL, VLDL, LDLCALC,  in the last 72 hours  Studies/Results: No results found.  Medications:  Scheduled Meds: . ALPRAZolam  1 mg Oral TID  . antiseptic oral rinse  15 mL Mouth Rinse BID  . citalopram  20 mg Oral BID  . fenofibrate  160 mg Oral QHS  . ferrous sulfate  325 mg Oral Q breakfast  . furosemide  40 mg Oral BID  . heparin  5,000 Units Subcutaneous Q8H  . insulin aspart  0-15 Units Subcutaneous TID WC  . insulin aspart  0-5 Units Subcutaneous QHS  . levothyroxine  100 mcg Oral QAC breakfast  . Linaclotide  145 mcg Oral Daily  . metFORMIN  1,000 mg Oral BID WC  . metoprolol tartrate  25 mg Oral QID  . morphine  30 mg Oral Q12H  . pantoprazole  40 mg Oral Daily  . regadenoson  0.4 mg Intravenous Once  . senna  1 tablet Oral BID   Continuous Infusions:  PRN Meds:.acetaminophen, LORazepam, LORazepam, nitroGLYCERIN, ondansetron (ZOFRAN) IV, ondansetron, technetium sestamibi     LOS: 3 days   Winfred Redel A. Gerilyn Pilgrim, M.D.  Diplomate, Biomedical engineer of Psychiatry and Neurology ( Neurology).

## 2013-08-21 NOTE — Progress Notes (Signed)
Physical Therapy Treatment Patient Details Name: Kara [name] MRN: [id number] DOB: 09-03-1945 Today's Date: 08/21/2013 Time: 1400-1450 PT Time Calculation (min): 50 min  PT Assessment / Plan / Recommendation  History of Present Illness Pt is now in ICU due to tacchycardia.  She is now stable per RN report and states that she feels well  but is generally fatigued.  She states that she is wanting to try to get her strength back.   PT Comments   Pt is alert and cooperative.  She tolerated therapeutic exercise with no episode of increased HR or significant fatigue. She did need a walker for maximal stability but was able to cover 150'.  She should be able to transition to home at d/c and she states that she has family support lined up.  She will need HHPT at that time.  Follow Up Recommendations        Does the patient have the potential to tolerate intense rehabilitation     Barriers to Discharge        Equipment Recommendations       Recommendations for Other Services    Frequency     Progress towards PT Goals Progress towards PT goals: Goals met and updated - see care plan  Plan Current plan remains appropriate    Precautions / Restrictions     Pertinent Vitals/Pain     Mobility  Bed Mobility Overal bed mobility: Independent Transfers Overall transfer level: Modified independent Equipment used: None Ambulation/Gait Ambulation/Gait assistance: Supervision Ambulation Distance (Feet): 150 Feet Assistive device: Rolling walker (2 wheeled) Gait Pattern/deviations: Decreased stride length Gait velocity interpretation: Below normal speed for age/gender General Gait Details: once she had walked about 100' her HR began to increase and ranged from 130-150.  She had no sx from this and RN was present.  Pt felt well enough to ambulate back to her bed.    Exercises General Exercises - Lower Extremity Ankle Circles/Pumps: AROM;Both;10 reps;Supine Short Arc Quad: AROM;Both;10  reps;Supine Heel Slides: AROM;Both;10 reps;Supine Hip ABduction/ADduction: Strengthening;Both;10 reps;Supine   PT Diagnosis:    PT Problem List:   PT Treatment Interventions:     PT Goals (current goals can now be found in the care plan section)    Visit Information  Last PT Received On: 08/21/13 History of Present Illness: Pt is now in ICU due to tacchycardia.  She is now stable per RN report and states that she feels well  but is generally fatigued.  She states that she is wanting to try to get her strength back.    Subjective Data      Cognition       Balance  Balance Overall balance assessment: No apparent balance deficits (not formally assessed)  End of Session PT - End of Session Equipment Utilized During Treatment: Gait belt Activity Tolerance: Patient tolerated treatment well Patient left: in bed;with call bell/phone within reach;with bed alarm set Nurse Communication: Mobility status   GP     Demetrios Isaacs L 08/21/2013, 3:00 PM

## 2013-08-21 NOTE — Progress Notes (Signed)
*  PRELIMINARY RESULTS* Echocardiogram 2D Echocardiogram has been performed.  Kara [name] 08/21/2013, 5:07 PM

## 2013-08-21 NOTE — Progress Notes (Signed)
Stress Lab Nurses Notes - Kara [name]  [name] 08/21/2013 Reason for doing test: Chest Pain and Dyspnea Type of test: Marlane HatcherLexiscan Cardiolite / Inpatient Nurse performing test: Parke PoissonPhyllis Billingsly, RN Nuclear Medicine Tech: Lyndel Pleasureyan Liles Echo Tech: Not Applicable MD performing test: Branch/K.Lawrence NP Family MD: Dr. [name] Test explained and consent signed: yes IV started: No redness or edema and Saline lock from floor Symptoms: Flushed Treatment/Intervention: None Reason test stopped: protocol completed After recovery IV was: No redness or edema and Saline Lock flushed Patient to return to Nuc. Med at : 10:15 Patient discharged: Transported back to room ICU 06 via wc Patient's Condition upon discharge was: stable Comments: During stress test BP 141/66 & HR116. Recovery BP 111/63 & HR 109.  Symptoms resolved in recovery.Continues to have back pain.Call Nurse to bring meds for pain. Erskine SpeedBillingsley, Neel Buffone T

## 2013-08-21 NOTE — Progress Notes (Signed)
[name]:  Spagnoli, Arielys              ACCOUNT NO.:  [id number]  MEDICAL RECORD NO.:  [id number]  LOCATION:  IC06                          FACILITY:  [facility]  PHYSICIAN:  Daje Stark G. [name], MD   DATE OF BIRTH:  [birth year]  DATE OF PROCEDURE: DATE OF DISCHARGE:                                PROGRESS NOTE   SUBJECTIVE:  This patient is alert this morning.  She had to be moved to the intensive care unit because of tachycardia, this was felt to be atrial flutter.  She was given an extra metoprolol and is in sinus rhythm this morning.  She still has headache and some burning inside, felt possibly secondary to prednisone.  She has been seen by Neurology. Chronic opioid use, was felt to have some defect on the symptoms that she is experiencing.  OBJECTIVE:  VITAL SIGNS:  Blood pressure 117/47, respirations 14, pulse 73, temp 98.7. HEENT:  Eyes, PERRLA.  TMs negative.  Oropharynx benign. NECK:  Supple.  No JVD or thyroid abnormalities. HEART:  Regular rhythm.  No murmurs. Lungs:  Clear to P and A. ABDOMEN:  No palpable organs or masses. NEUROLOGIC:  No focal neurological signs.  ASSESSMENT:  The patient was felt to be dehydrated initially, this has improved.  She has chronic headaches which could be postconcussion headaches or related to chronic opioid use.  She has stable but chronic obstructive pulmonary disease, anemia of chronic disease, hypothyroidism.  She will continue current regimen and obtain Cardiology consult.     [name] G. [name], MD     AGM/MEDQ  D:  08/20/2013  T:  08/20/2013  Job:  [id number]

## 2013-08-22 ENCOUNTER — Encounter (HOSPITAL_COMMUNITY): Payer: Medicare Other

## 2013-08-22 ENCOUNTER — Ambulatory Visit (HOSPITAL_COMMUNITY): Payer: Medicare Other

## 2013-08-22 ENCOUNTER — Inpatient Hospital Stay (HOSPITAL_COMMUNITY): Payer: Medicare Other

## 2013-08-22 DIAGNOSIS — I519 Heart disease, unspecified: Secondary | ICD-10-CM

## 2013-08-22 LAB — GLUCOSE, CAPILLARY
GLUCOSE-CAPILLARY: 124 mg/dL — AB (ref 70–99)
Glucose-Capillary: 124 mg/dL — ABNORMAL HIGH (ref 70–99)
Glucose-Capillary: 168 mg/dL — ABNORMAL HIGH (ref 70–99)
Glucose-Capillary: 104 mg/dL — ABNORMAL HIGH (ref 70–99)

## 2013-08-22 MED ORDER — LISINOPRIL 5 MG PO TABS
2.5000 mg | ORAL_TABLET | Freq: Every day | ORAL | Status: DC
  Administered 2013-08-22 – 2013-08-23 (×2): 2.5 mg via ORAL
  Filled 2013-08-22 (×2): qty 1

## 2013-08-22 MED ORDER — IOHEXOL 350 MG/ML SOLN
100.0000 mL | Freq: Once | INTRAVENOUS | Status: AC | PRN
  Administered 2013-08-22: 100 mL via INTRAVENOUS

## 2013-08-22 MED ORDER — METOPROLOL SUCCINATE ER 50 MG PO TB24
50.0000 mg | ORAL_TABLET | Freq: Two times a day (BID) | ORAL | Status: DC
  Administered 2013-08-22 – 2013-08-23 (×3): 50 mg via ORAL
  Filled 2013-08-22 (×3): qty 1

## 2013-08-22 MED ORDER — METFORMIN HCL 500 MG PO TABS: 1000.0000 mg | ORAL_TABLET | Freq: Two times a day (BID) | ORAL | Status: DC

## 2013-08-22 NOTE — Progress Notes (Signed)
Patient ID: Kara [name], female   DOB: 05/26/1946, 68 y.o.   MRN: [id number]     Primary cardiologist:  Subjective:   No compliants this morning.    Objective:   Temp:  [97.5 F (36.4 C)-99.4 F (37.4 C)] 97.5 F (36.4 C) (01/30 0528) Pulse Rate:  [78-101] 78 (01/30 0528) Resp:  [18-20] 18 (01/30 0528) BP: (102-108)/(62-68) 107/68 mmHg (01/30 0528) SpO2:  [100 %] 100 % (01/30 0528) Last BM Date: 08/21/13  Filed Weights   08/18/13 1859 08/20/13 0500 08/21/13 0500  Weight: 157 lb 10.1 oz (71.5 kg) 154 lb 12.2 oz (70.2 kg) 157 lb 3 oz (71.3 kg)    Intake/Output Summary (Last 24 hours) at 08/22/13 0857 Last data filed at 08/22/13 0531  Gross per 24 hour  Intake      0 ml  Output   1500 ml  Net  -1500 ml    Telemetry: sinus rhythm, sinus tach, short runs of PSVT  Exam:  General: NAD  Resp: CTAB  Cardiac: RRR, no m/r/g, no JVD, no carotid bruits  HQ:IONGEXBGI:abdomen soft, NT, ND  MSK: extremities are warm, no edema  Neuro: no focal deficits  Lab Results:  Basic Metabolic Panel:  Recent Labs Lab 08/18/13 1557 08/19/13 0544  NA 140 144  K 4.9 4.0  CL 99 101  CO2 30 33*  GLUCOSE 123* 84  BUN 34* 28*  CREATININE 1.09 1.22*  CALCIUM 9.8 9.1    Liver Function Tests:  Recent Labs Lab 08/18/13 1557 08/19/13 0544  AST 15 13  ALT 6 5  ALKPHOS 66 57  BILITOT 0.4 0.5  PROT 7.4 6.6  ALBUMIN 3.4* 3.0*    CBC:  Recent Labs Lab 08/18/13 1557 08/19/13 0544  WBC 17.7* 16.1*  HGB 10.6* 10.0*  HCT 34.4* 32.5*  MCV 94.0 95.3  PLT 411* 336    Cardiac Enzymes:  Recent Labs Lab 08/19/13 1813 08/19/13 2314 08/20/13 0452  TROPONINI 0.30* 0.33* <0.30    BNP: No results found for this basename: PROBNP,  in the last 8760 hours  Coagulation: No results found for this basename: INR,  in the last 168 hours  ECG:   Medications:   Scheduled Medications: . ALPRAZolam  1 mg Oral TID  . antiseptic oral rinse  15 mL Mouth Rinse BID  . citalopram   20 mg Oral BID  . fenofibrate  160 mg Oral QHS  . ferrous sulfate  325 mg Oral Q breakfast  . furosemide  40 mg Oral BID  . heparin  5,000 Units Subcutaneous Q8H  . insulin aspart  0-15 Units Subcutaneous TID WC  . insulin aspart  0-5 Units Subcutaneous QHS  . levothyroxine  100 mcg Oral QAC breakfast  . Linaclotide  145 mcg Oral Daily  . [START ON 08/24/2013] metFORMIN  1,000 mg Oral BID WC  . metoprolol tartrate  50 mg Oral BID  . morphine  30 mg Oral Q12H  . pantoprazole  40 mg Oral Daily  . senna  1 tablet Oral BID     Infusions:     PRN Medications:  acetaminophen, nitroGLYCERIN, ondansetron (ZOFRAN) IV, ondansetron     Assessment/Plan   1. Aflutter  - transient episode after systemic reaction to prednisone, Converted back to NSR with dilt gtt.  - CHADS2 score is 3, it is unclear if she has recurrent aflutter or this was related to her severe systemic reaction to prednisone. She denies significant prior palpitations. TSH 1.25.  - telemetry  has showed some occasional PSVT, however no recurrence of afib or aflutter - will not start anticoag at this point, will need home event monitor to follow up for recurrence of flutter    2. Systolic dysfunction in setting of tachycardia very mild trop 0.33 that has since resolved with ST depressions inferior and lateral. No chest pain.  - has had only non-cardiac chest pain at home, relieved with belching and passing gas  - Lexiscan shows inferoseptal scar with no current myocardium at jeopardy/no active ischemia - echo shows LVEF 40% with probable hypokinesis of the basalinferolatral myocardium. 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  - initiate medical management for systolic dysfunction, will change lopressor to Toprol XL (will dose bid to minimize hemodynamic effects). Will start  very low dose ACE-I given that she has soft blood pressures at times. Optimize medical therapy with outpatient follow up, at this time there is no indication inpatient cath. Will defer possible outpatient cath neccesity and timing to her primary cardiologist Dr Diona Browner.   - echo with question of possible aortic dissection, no evidence by CT scan.   - from cardiac standpoint if patient is able to ambulate without difficulties, tolerates her AM doses of Toprol XL and lisinopril, reasonable to discharge today with follow up with either NP Lyman Bishop or Dr Diona Browner in 2-3 weeks. We will arrange a 14 day event monitor.     Dina Rich, M.D., F.A.C.C.

## 2013-08-22 NOTE — Progress Notes (Signed)
Physical Therapy Treatment Patient Details Name: Kara ShirtsCarolyn W Uselton MRN: [id number] DOB: 06/27/1946 Today's Date: 08/22/2013 Time: [id number] PT Time Calculation (min): 51 min  PT Assessment / Plan / Recommendation  History of Present Illness Pt states that she feels fairly well but has increased body heat when chewing.  She also feels that she is on too much medicine.   PT Comments   Pt tolerated increased activity well today with a stable HR.  Her gait with a walker is very slow but stable.  She needs encouragement to try to do for herself.  She should do well transitioning to home.  Follow Up Recommendations        Does the patient have the potential to tolerate intense rehabilitation     Barriers to Discharge        Equipment Recommendations       Recommendations for Other Services    Frequency     Progress towards PT Goals Progress towards PT goals: Progressing toward goals  Plan Current plan remains appropriate    Precautions / Restrictions Restrictions Weight Bearing Restrictions: No   Pertinent Vitals/Pain     Mobility  Transfers Overall transfer level: Modified independent Ambulation/Gait Ambulation/Gait assistance: Supervision Ambulation Distance (Feet): 175 Feet Assistive device: Rolling walker (2 wheeled) Gait Pattern/deviations: WFL(Within Functional Limits) General Gait Details: HR remains stable up to 105 BPM.  She was instructed in safe turns with walker    Exercises     PT Diagnosis:    PT Problem List:   PT Treatment Interventions:     PT Goals (current goals can now be found in the care plan section)    Visit Information  Last PT Received On: 08/22/13 History of Present Illness: Pt states that she feels fairly well but has increased body heat when chewing.  She also feels that she is on too much medicine.    Subjective Data      Cognition       Balance     End of Session PT - End of Session Equipment Utilized During Treatment: Gait  belt;Oxygen Activity Tolerance: Patient tolerated treatment well Patient left: in bed;with call bell/phone within reach;with bed alarm set Nurse Communication: Mobility status   GP     Konrad PentaBrown, Jeylin Woodmansee L 08/22/2013, 10:44 AM

## 2013-08-22 NOTE — Progress Notes (Signed)
[name]:  [name], [name]              ACCOUNT NO.:  [id number]  MEDICAL RECORD NO.:  [id number]  LOCATION:  A306                          FACILITY:  [facility]  PHYSICIAN:  Solveig Fangman G. [name], MD   DATE OF BIRTH:  May 23, 1946  DATE OF PROCEDURE: DATE OF DISCHARGE:                                PROGRESS NOTE   This patient remains alert.  Heart rhythm now is 76 with more regular rhythm.  She was felt to be in atrial flutter, was given metoprolol. Still has some slight headache but not as severe as previously.  Has been seen by Neurology.  Chronic opioid use, was felt to possibly have caused some of the headache.  OBJECTIVE:  VITAL SIGNS:  Blood pressure 113/54, respirations 9, pulse 76, temperature 98.6. HEENT:  Eyes, PERRLA.  TMs negative.  Oropharynx benign. NECK:  Supple.  No JVD or thyroid abnormalities. HEART:  Regular rhythm.  No murmurs. LUNGS:  Clear to P and A. ABDOMEN:  No palpable organs or masses. NEUROLOGIC:  No focal neurological signs.  ASSESSMENT:  The patient was felt to be dehydrated initially.  This has improved.  She does have chronic recurrent headaches, possible postconcussion headaches and/or related to chronic opioid use, has chronic obstructive pulmonary disease, anemia of chronic disease, and hypothyroidism.  She has a history of diastolic congestive heart failure.  They recommended an event monitor as an outpatient before comitting to long-term anticoagulation.     Larico Dimock G. [name], MD     AGM/MEDQ  D:  08/21/2013  T:  08/22/2013  Job:  [id number]

## 2013-08-22 NOTE — Progress Notes (Signed)
Patient ID: Kara [name], female   DOB: 02-18-1946, 68 y.o.   MRN: [id number]  [name], MD     [email or web address]          Kara [name] is an 68 y.o. female.   Assessment/Plan: 1. Likely medication effect presented with dizziness, diaphoresis, palpitation and tremors. The effect is most likely from prednisone. This has been appropriately discontinued.  2. Recalcitrant headache of unclear etiology. The patient refuses MRI testing because of being claustrophobia. This has been attempted several times previously. The patient does have a baseline history of episodic primary headaches however and this current headache could represent transformed migraine headaches from chronic opioid therapy and possible chronic medication overuse. Consider prophylactic HA meds and botox HA prophylaxis. 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 I did suggest to the patient that she see her dentist and try a mouthguard to help with the left TMJ headaches. 3. Chronic pain syndrome on chronic opioids.     She actually reports doing fairly well this evening. She reports that she only has a mild headache graded at 3/5. Chronic pain problems also doing well. She reports that she did have a more severe headache in the morning time. This happens consistently when she tries to true on the left side.  GENERAL: She is a lot less calm today. She is clearly improved.  HEENT: Supple. Atraumatic normocephalic.  ABDOMEN: soft  EXTREMITIES: No edema. No tremors or shaking is seen.  BACK: Normal.  SKIN: Normal by inspection.  MENTAL STATUS: Alert and oriented. Speech, language and cognition are generally intact. Judgment and insight normal.  MOTOR: Normal tone, bulk and strength In the upper extremities; no pronator drift. Hip flexion is 4/5 bilaterally. Dorsiflexion 5.  COORDINATION: Left finger to nose is normal, right finger  to nose is normal, No rest tremor; no intention tremor; no postural tremor; no bradykinesia.  GAIT: Normal.     Objective: Vital signs in last 24 hours: Temp:  [97.2 F (36.2 C)-99.4 F (37.4 C)] 97.2 F (36.2 C) (01/30 1513) Pulse Rate:  [75-101] 80 (01/30 1513) Resp:  [18-20] 18 (01/30 1513) BP: (102-121)/(48-68) 109/48 mmHg (01/30 1513) SpO2:  [100 %] 100 % (01/30 1513)  Intake/Output from previous day: 01/29 0701 - 01/30 0700 In: -  Out: 1500 [Urine:1500] Intake/Output this shift: Total I/O In: 480 [P.O.:480] Out: -  Nutritional status: Carb Control   Lab Results: Results for orders placed during the hospital encounter of 08/18/13 (from the past 48 hour(s))  GLUCOSE, CAPILLARY     Status: Abnormal   Collection Time    08/20/13 10:21 PM      Result Value Range   Glucose-Capillary 127 (*) 70 - 99 mg/dL   Comment 1 Notify RN    GLUCOSE, CAPILLARY     Status: Abnormal   Collection Time    08/21/13  7:35 AM      Result Value Range   Glucose-Capillary 129 (*) 70 - 99 mg/dL  GLUCOSE, CAPILLARY     Status: Abnormal   Collection Time    08/21/13  4:44 PM      Result Value Range   Glucose-Capillary 120 (*) 70 - 99 mg/dL  GLUCOSE, CAPILLARY     Status: Abnormal   Collection Time    08/21/13  8:51 PM      Result Value Range   Glucose-Capillary 138 (*) 70 - 99  mg/dL   Comment 1 Notify RN    GLUCOSE, CAPILLARY     Status: Abnormal   Collection Time    08/22/13  7:43 AM      Result Value Range   Glucose-Capillary 124 (*) 70 - 99 mg/dL   Comment 1 Notify RN    GLUCOSE, CAPILLARY     Status: Abnormal   Collection Time    08/22/13 12:06 PM      Result Value Range   Glucose-Capillary 124 (*) 70 - 99 mg/dL   Comment 1 Notify RN    GLUCOSE, CAPILLARY     Status: Abnormal   Collection Time    08/22/13  5:09 PM      Result Value Range   Glucose-Capillary 168 (*) 70 - 99 mg/dL   Comment 1 Notify RN     Comment 2 Documented in Chart      Lipid Panel No results  found for this basename: CHOL, TRIG, HDL, CHOLHDL, VLDL, LDLCALC,  in the last 72 hours  Studies/Results: Nm Myocar Single W/spect W/wall Motion And Ef  08/21/2013   CLINICAL DATA:  68 yo female with no history of CAD, referred for elevated troponin and tachycardia.  EXAM: MYOCARDIAL IMAGING WITH SPECT (REST AND PHARMACOLOGIC-STRESS)  GATED LEFT VENTRICULAR WALL MOTION STUDY  LEFT VENTRICULAR EJECTION FRACTION  TECHNIQUE: Standard myocardial SPECT imaging was performed after resting intravenous injection of 10 mCi Tc-40m sestamibi. Subsequently, intravenous infusion of Lexiscan was performed under the supervision of the Cardiology staff. At peak effect of the drug, 30 mCi Tc-39m sestamibi was injected intravenously and standard myocardial SPECT imaging was performed. Quantitative gated imaging was also performed to evaluate left ventricular wall motion, and estimate left ventricular ejection fraction.  COMPARISON:  None.  FINDINGS: Pharmacological Stress  Resting EKG showed normal sinus rhythm with PACs, T-wave inversions in the inferior and lateral precordial leads. After injection pulse increased from 87 bpm to 122 bpm, and blood pressure increased from 117/59 to 141/66 mmHg. Post-injectino EKG showed stable T-wave inversions and no significant arrhythmias. The test was stopped after injection was completed, the patient did not experience any chest pain.  Myocardial Perfusion Imaging  Raw images showed appropriate tracer uptake. There was a moderate sized fixed inferosepta wall defect that was hypokinetic consistent with prior inferior wall infarct. There were no other myocardial perfusion defects.  Gated imaging showed EDV 84 mL, ESV 56 mL, LVEF 33%, TID 0.94. The inferoseptal wall segment was hypokinetic.  IMPRESSION: 1.  Abnormal Lexiscan MPI  2. Evidence of prior inferoseptal infarct with no current myocardium at jeopardy.  3. Low left ventricular systolic function, indicates high risk study for major  cardiac events. Recommend correlate LVEF and wall motion with echocardiography.   Electronically Signed   By: Dina Rich   On: 08/21/2013 12:26   Ct Angio Chest Aortic Dissect W &/or W/o  08/22/2013   CLINICAL DATA:  COPD, GERD, hypertension  EXAM: CT ANGIOGRAPHY CHEST WITH CONTRAST  TECHNIQUE: Multidetector CT imaging of the chest was performed using the standard protocol during bolus administration of intravenous contrast. Multiplanar CT image reconstructions including MIPs were obtained to evaluate the vascular anatomy.  CONTRAST:  OMNIPAQUE IOHEXOL 350 MG/ML SOLN  COMPARISON:  01/03/2013 PET-CT  FINDINGS: Mild ectasia of the aortic root and ascending aorta up to 3.8 cm, tapers along the arch to a normal caliber. Scattered atherosclerosis of the aorta and branch vessels. 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 No pulmonary arterial branch filling defect identified.  The heart size is within normal limits. Coronary artery calcifications. Trace pericardial fluid. No pleural effusion.  No intrathoracic lymphadenopathy. Right posterior mediastinal/ paravertebral fat and soft tissue attenuation mass measures 4.2 x 2.2 cm, similar to prior allowing for differences in slice selection/measurement technique.  Upper abdominal images show low attenuation of the liver, nonspecific in the arterial phase however may reflect fatty infiltration. Cholecystectomy.  Central airways are patent. No pneumothorax. No confluent airspace opacity.  Centrilobular emphysema. Mild areas of scarring within the lower lobes. Calcified granuloma right upper lobe.  No acute osseous finding  Review of the MIP images confirms the above findings.  IMPRESSION: Mild ascending aortic ectasia, tapering along the arch to a normal caliber. No aortic dissection.  Dilated pulmonary arteries may reflect pulmonary arterial hypertension.  Trace  pericardial effusion.  Centrilobular emphysema.  Right posterior mediastinal/paravertebral fat and soft tissue density mass is indeterminate however similar to prior.   Electronically Signed   By: Jearld LeschAndrew  DelGaizo M.D.   On: 08/22/2013 01:14    Medications:  Scheduled Meds: . ALPRAZolam  1 mg Oral TID  . antiseptic oral rinse  15 mL Mouth Rinse BID  . citalopram  20 mg Oral BID  . fenofibrate  160 mg Oral QHS  . ferrous sulfate  325 mg Oral Q breakfast  . furosemide  40 mg Oral BID  . heparin  5,000 Units Subcutaneous Q8H  . insulin aspart  0-15 Units Subcutaneous TID WC  . insulin aspart  0-5 Units Subcutaneous QHS  . levothyroxine  100 mcg Oral QAC breakfast  . Linaclotide  145 mcg Oral Daily  . lisinopril  2.5 mg Oral Daily  . [START ON 08/24/2013] metFORMIN  1,000 mg Oral BID WC  . metoprolol succinate  50 mg Oral BID  . morphine  30 mg Oral Q12H  . pantoprazole  40 mg Oral Daily  . senna  1 tablet Oral BID   Continuous Infusions:  PRN Meds:.acetaminophen, nitroGLYCERIN, ondansetron (ZOFRAN) IV, ondansetron     LOS: 4 days   Cadence Haslam A. Gerilyn Pilgrimoonquah, M.D.  Diplomate, Biomedical engineerAmerican Board of Psychiatry and Neurology ( Neurology).

## 2013-08-23 LAB — GLUCOSE, CAPILLARY
GLUCOSE-CAPILLARY: 139 mg/dL — AB (ref 70–99)
GLUCOSE-CAPILLARY: 158 mg/dL — AB (ref 70–99)

## 2013-08-23 MED ORDER — FUROSEMIDE 40 MG PO TABS: 40.0000 mg | ORAL_TABLET | Freq: Two times a day (BID) | ORAL | Status: DC

## 2013-08-23 MED ORDER — LISINOPRIL 2.5 MG PO TABS: 2.5000 mg | ORAL_TABLET | Freq: Every day | ORAL | Status: DC

## 2013-08-23 NOTE — Progress Notes (Signed)
NAMMarlowe Kays:  Kutter, Safari              ACCOUNT NO.:  [id number]  MEDICAL RECORD NO.:  [id number]  LOCATION:                                 FACILITY:  PHYSICIAN:  Jaclynn Laumann G. [name], MD   DATE OF BIRTH:  02-Apr-1946  DATE OF PROCEDURE: DATE OF DISCHARGE:                                PROGRESS NOTE   SUBJECTIVE:  This patient is feeling much better.  She did have some physical therapy yesterday.  Her headache is less intense.  She did have Lexiscan and 2D echo yesterday.  He has been treated for atrial flutter with metoprolol.  She has been seen by Cardiology and Neurology. Chronic opioid abuse.  It was felt to possibly have caused some of the headache.  Lexiscan studies showed evidence of prior anterior septal infarct.  Low left ventricular systolic function on 2D echo.  Estimated ejection fraction 40%.  There was a linear echo density noted within the proximal aortic arch of concern for dissection flap.  CT did not  reveal that she does have grade 1 diastolic dysfunction by 2D echo.  OBJECTIVE:  VITAL SIGNS:  The patient is alert and oriented, BP 107/68, respirations 18, pulse 78, temperature 97.5. HEENT:  Eyes, PERRLA, TMs negative.  Oropharynx benign. NECK:  Supple.  No JVD or thyroid abnormalities. HEART:  Regular rhythm.  No murmurs. LUNGS:  Clear to P and A. ABDOMEN:  No palpable organs or masses. NEUROLOGIC:  No focal neurological signs.  ASSESSMENT:  The patient was dehydrated.  Initially, this has improved. She has a history of postconcussion headaches and headache related to chronic opioid use and possibly steroid use.  Chronic obstructive pulmonary disease and anemia of chronic disease.  Hypothyroidism, diastolic congestive heart failure,  prior myocardial infarctions.  PLAN:  To continue gradually ambulation with help of physical therapy. Continue current meds.     Jaisen Wiltrout G. [name], MD     AGM/MEDQ  D:  08/22/2013  T:  08/23/2013  Job:  [id number]

## 2013-08-23 NOTE — Discharge Summary (Signed)
[name]:  [name], Kara              ACCOUNT NO.:  [id number]  MEDICAL RECORD NO.:  [id number]  LOCATION:  A306                          FACILITY:  [facility]  PHYSICIAN:  Hanna Ra G. Amer Alcindor, MD   DATE OF BIRTH:  Nov 06, 1945  DATE OF ADMISSION:  08/18/2013 DATE OF DISCHARGE:  01/31/2015LH                              DISCHARGE SUMMARY   ADDENDUM:  The patient will be taking the following medications, Celexa 20 mg b.i.d., MS Contin 30 mg b.i.d., Nitrostat 0.4 mg p.r.n., Lopressor 25 mg t.i.d., Synthroid 100 mcg daily, metformin 1000 mg b.i.d., Tylenol 325 mg every 6 hours as needed, nebulizer misc, alprazolam 1 mg t.i.d., fenofibrate 160 mg at bedtime, potassium chloride 20 mEq b.i.d., Linzess 145 mcg capsules daily, Protonix 40 mg daily, tramadol 50 mg tablet 100 mg every 6 hours as needed for pain, Flexeril 5 mg t.i.d., ferrous sulfate 325 mg daily.     [name] G. [name], MD     AGM/MEDQ  D:  08/23/2013  T:  08/23/2013  Job:  [id number]

## 2013-08-23 NOTE — Progress Notes (Signed)
Patient states understanding of discharge instruction.

## 2013-08-24 NOTE — Discharge Summary (Signed)
Kara [name], Kara [name]              ACCOUNT NO.:  [id number]  MEDICAL RECORD NO.:  [id number]  LOCATION:  A306                          FACILITY:  [facility]  PHYSICIAN:  Adaleen Hulgan G. Renard Matter, MD   DATE OF BIRTH:  12-Jan-1946  DATE OF ADMISSION:  08/18/2013 DATE OF DISCHARGE:  01/31/2015LH                              DISCHARGE SUMMARY   This patient was admitted on August 18, 2012, discharged on August 24, 2012, five days' hospitalization.  DIAGNOSES: 1. Dehydration. 2. Chronic headaches. 3. Possible postconcussion. 4. Possible reaction to prednisone. 5. Chronic obstructive pulmonary disease. 6. Hypothyroidism. 7. Anemia of chronic disease. 8. Systolic congestive heart failure with prior myocardial infarction. 9. Atrial flutter.  CONDITION:  Stable and improved at the time of her discharge.  This patient was admitted to the hospital after having presented to the emergency room with severe headache and symptoms of burning and shaking __________ since she started on prednisone by Neurology.  She had been on MS Contin three times a day and had developed some twitching of her body.  PHYSICAL EXAMINATION:  VITAL SIGNS:  Blood pressure 137/66, respirations 20, pulse 103, temp 98.3. HEENT:  Eyes:  PERRLA.  TMs negative.  Oropharynx benign. NECK:  Supple.  No JVD or thyroid abnormalities. HEART:  Regular rhythm.  No murmurs. LUNGS:  Clear to P and A. ABDOMEN:  No palpable organs or masses. NEUROLOGIC:  No focal neurological signs.  RADIOLOGIC DATA:  CT of the head, questionable old lacunar infarctions. No acute intracranial abnormalities.  CT angio of the chest, dilated pulmonary arteries, it may reflect pulmonary arterial hypertension, central lobar emphysema, trace of pericardial effusion.  Lexiscan, abnormal Lexiscan, evidence of prior inferior septal infarct with no current myocardial problems, low left ventricular systolic function.  CT of the head without contrast, no acute  intracranial abnormalities.  LABORATORY DATA:  Glucose during her hospitalization ranged from 99 to 138.  Labs on admission:  CBC; WBC 17,000 with hemoglobin 10.6, hematocrit 34.4.  Chemistries on admission; sodium 140, potassium 4.9, chloride 99, CO2 30, glucose 123, BUN 34, creatinine 1.09.  HOSPITAL COURSE:  The patient through entire hospitalization, had headache, but this improved.  She did develop tachycardia and had to be transferred to intensive care, was treated with metoprolol and this did improve, it was thought to be atrial flutter.  She was seen by Cardiology.  Lexiscan stress test was abnormal, showing evidence of prior MI.  2D echocardiogram showed 40% ejection fraction.  She is felt to be in systolic congestive heart failure, was diuresed and improved. She was continued on the following medications: 1. Xanax 1 mg t.i.d. 2. Celexa 20 mg b.i.d. 3. Fenofibrate 160 mg at bedtime. 4. Ferrous sulfate 325 mg daily. 5. Lasix 40 mg b.i.d. 6. Levothyroxine 100 mcg daily. 7. Linzess 145 mg daily. 8. Prinivil 2.5 mg daily. 9. MS Contin 12 mg every 12 hours. 10.Protonix 40 mg daily. 11.Senokot 8.6 mg b.i.d.  She also is diabetic and was given NovoLog insulin for this and continued on metformin 1000 mg b.i.d.  The patient was seen as well by Neurology, and was feeling that these headaches, in part, could have been due to previous  pain medications prior to this hospitalization and it was felt that the patient could be discharged and followed as an outpatient.     Bryker Fletchall G. [name], MD     AGM/MEDQ  D:  08/23/2013  T:  08/23/2013  Job:  [id number]

## 2013-08-25 NOTE — Progress Notes (Signed)
UR chart review completed.  

## 2013-08-27 NOTE — Patient Instructions (Addendum)
Your procedure is scheduled on: 09/01/2013  Report to [facility] at  700  AM.  Call this number if you have problems the morning of surgery: [phone]   Do not eat food or drink liquids :After Midnight.      Take these medicines the morning of surgery with A SIP OF WATER: xanax, celexa, flexaril, levothyroxine, lisinopril, metoprolol, morphine, protonix, ultram. Take your nebulizer before you come.   Do not wear jewelry, make-up or nail polish.  Do not wear lotions, powders, or perfumes.  Do not shave 48 hours prior to surgery.  Do not bring valuables to the hospital.  Contacts, dentures or bridgework may not be worn into surgery.  Leave suitcase in the car. After surgery it may be brought to your room.  For patients admitted to the hospital, checkout time is 11:00 AM the day of discharge.   Patients discharged the day of surgery will not be allowed to drive home.  :     Please read over the following fact sheets that you were given: Coughing and Deep Breathing, Surgical Site Infection Prevention, Anesthesia Post-op Instructions and Care and Recovery After Surgery    Cataract A cataract is a clouding of the lens of the eye. When a lens becomes cloudy, vision is reduced based on the degree and nature of the clouding. Many cataracts reduce vision to some degree. Some cataracts make people more near-sighted as they develop. Other cataracts increase glare. Cataracts that are ignored and become worse can sometimes look white. The white color can be seen through the pupil. CAUSES   Aging. However, cataracts may occur at any age, even in newborns.   Certain drugs.   Trauma to the eye.   Certain diseases such as diabetes.   Specific eye diseases such as chronic inflammation inside the eye or a sudden attack of a rare form of glaucoma.   Inherited or acquired medical problems.  SYMPTOMS   Gradual, progressive drop in vision in the affected eye.   Severe, rapid visual loss. This most  often happens when trauma is the cause.  DIAGNOSIS  To detect a cataract, an eye doctor examines the lens. Cataracts are best diagnosed with an exam of the eyes with the pupils enlarged (dilated) by drops.  TREATMENT  For an early cataract, vision may improve by using different eyeglasses or stronger lighting. If that does not help your vision, surgery is the only effective treatment. A cataract needs to be surgically removed when vision loss interferes with your everyday activities, such as driving, reading, or watching TV. A cataract may also have to be removed if it prevents examination or treatment of another eye problem. Surgery removes the cloudy lens and usually replaces it with a substitute lens (intraocular lens, IOL).  At a time when both you and your doctor agree, the cataract will be surgically removed. If you have cataracts in both eyes, only one is usually removed at a time. This allows the operated eye to heal and be out of danger from any possible problems after surgery (such as infection or poor wound healing). In rare cases, a cataract may be doing damage to your eye. In these cases, your caregiver may advise surgical removal right away. The vast majority of people who have cataract surgery have better vision afterward. HOME CARE INSTRUCTIONS  If you are not planning surgery, you may be asked to do the following:  Use different eyeglasses.   Use stronger or brighter lighting.  Ask your eye doctor about reducing your medicine dose or changing medicines if it is thought that a medicine caused your cataract. Changing medicines does not make the cataract go away on its own.   Become familiar with your surroundings. Poor vision can lead to injury. Avoid bumping into things on the affected side. You are at a higher risk for tripping or falling.   Exercise extreme care when driving or operating machinery.   Wear sunglasses if you are sensitive to bright light or experiencing problems  with glare.  SEEK IMMEDIATE MEDICAL CARE IF:   You have a worsening or sudden vision loss.   You notice redness, swelling, or increasing pain in the eye.   You have a fever.  Document Released: 07/10/2005 Document Revised: 06/29/2011 Document Reviewed: 03/03/2011 [facility] Patient Information 2012 [place].PATIENT INSTRUCTIONS POST-ANESTHESIA  IMMEDIATELY FOLLOWING SURGERY:  Do not drive or operate machinery for the first twenty four hours after surgery.  Do not make any important decisions for twenty four hours after surgery or while taking narcotic pain medications or sedatives.  If you develop intractable nausea and vomiting or a severe headache please notify your doctor immediately.  FOLLOW-UP:  Please make an appointment with your surgeon as instructed. You do not need to follow up with anesthesia unless specifically instructed to do so.  WOUND CARE INSTRUCTIONS (if applicable):  Keep a dry clean dressing on the anesthesia/puncture wound site if there is drainage.  Once the wound has quit draining you may leave it open to air.  Generally you should leave the bandage intact for twenty four hours unless there is drainage.  If the epidural site drains for more than 36-48 hours please call the anesthesia department.  QUESTIONS?:  Please feel free to call your physician or the hospital operator if you have any questions, and they will be happy to assist you.

## 2013-08-28 ENCOUNTER — Telehealth: Payer: Self-pay | Admitting: *Deleted

## 2013-08-28 ENCOUNTER — Encounter (HOSPITAL_COMMUNITY): Payer: Self-pay | Admitting: Pharmacy Technician

## 2013-08-28 ENCOUNTER — Encounter (HOSPITAL_COMMUNITY): Payer: Self-pay

## 2013-08-28 ENCOUNTER — Encounter (HOSPITAL_COMMUNITY)
Admission: RE | Admit: 2013-08-28 | Discharge: 2013-08-28 | Disposition: A | Discharge status: Home / Self Care | Payer: Medicare Other | Source: Ambulatory Visit | Attending: Ophthalmology | Admitting: Ophthalmology

## 2013-08-28 NOTE — Telephone Encounter (Signed)
.  left message to have patient return my call.  

## 2013-08-28 NOTE — Telephone Encounter (Signed)
Message copied by Ovidio KinPULLIAM, Floris Neuhaus A on Thu Aug 28, 2013  9:20 AM ------      Message from: BaxleyBRANCH, JONATHAN F      Created: Wed Aug 27, 2013  9:47 AM       Can you set up a 14 day event monitor for this inpatient I had last week.            Dina RichJonathan Branch MD ------

## 2013-08-29 MED ORDER — NEOMYCIN-POLYMYXIN-DEXAMETH 3.5-10000-0.1 OP SUSP
OPHTHALMIC | Status: AC
  Filled 2013-08-29: qty 5

## 2013-08-29 MED ORDER — LIDOCAINE HCL 3.5 % OP GEL
OPHTHALMIC | Status: AC
  Filled 2013-08-29: qty 1

## 2013-08-29 MED ORDER — LIDOCAINE HCL (PF) 1 % IJ SOLN
INTRAMUSCULAR | Status: AC
  Filled 2013-08-29: qty 2

## 2013-08-29 MED ORDER — CYCLOPENTOLATE-PHENYLEPHRINE OP SOLN OPTIME - NO CHARGE
OPHTHALMIC | Status: AC
  Filled 2013-08-29: qty 2

## 2013-08-29 MED ORDER — PHENYLEPHRINE HCL 2.5 % OP SOLN
OPHTHALMIC | Status: AC
  Filled 2013-08-29: qty 15

## 2013-08-29 MED ORDER — TETRACAINE HCL 0.5 % OP SOLN
OPHTHALMIC | Status: AC
  Filled 2013-08-29: qty 2

## 2013-09-01 ENCOUNTER — Telehealth: Payer: Self-pay | Admitting: *Deleted

## 2013-09-01 ENCOUNTER — Ambulatory Visit (HOSPITAL_COMMUNITY)
Admission: RE | Admit: 2013-09-01 | Discharge: 2013-09-01 | Disposition: A | Discharge status: Home / Self Care | Payer: Medicare Other | Source: Ambulatory Visit | Attending: Ophthalmology | Admitting: Ophthalmology

## 2013-09-01 ENCOUNTER — Encounter (HOSPITAL_COMMUNITY)
Admission: RE | Disposition: A | Discharge status: Home / Self Care | Payer: Self-pay | Source: Ambulatory Visit | Attending: Ophthalmology

## 2013-09-01 ENCOUNTER — Ambulatory Visit (HOSPITAL_COMMUNITY): Payer: Medicare Other | Admitting: Anesthesiology

## 2013-09-01 ENCOUNTER — Encounter (HOSPITAL_COMMUNITY): Payer: Medicare Other | Admitting: Anesthesiology

## 2013-09-01 ENCOUNTER — Encounter (HOSPITAL_COMMUNITY): Payer: Self-pay | Admitting: Anesthesiology

## 2013-09-01 DIAGNOSIS — Z87891 Personal history of nicotine dependence: Secondary | ICD-10-CM | POA: Insufficient documentation

## 2013-09-01 DIAGNOSIS — E119 Type 2 diabetes mellitus without complications: Secondary | ICD-10-CM | POA: Insufficient documentation

## 2013-09-01 DIAGNOSIS — I2510 Atherosclerotic heart disease of native coronary artery without angina pectoris: Secondary | ICD-10-CM | POA: Insufficient documentation

## 2013-09-01 DIAGNOSIS — Z79899 Other long term (current) drug therapy: Secondary | ICD-10-CM | POA: Insufficient documentation

## 2013-09-01 DIAGNOSIS — I10 Essential (primary) hypertension: Secondary | ICD-10-CM | POA: Insufficient documentation

## 2013-09-01 DIAGNOSIS — H2589 Other age-related cataract: Secondary | ICD-10-CM | POA: Insufficient documentation

## 2013-09-01 HISTORY — PX: CATARACT EXTRACTION W/PHACO: SHX586

## 2013-09-01 LAB — GLUCOSE, CAPILLARY: Glucose-Capillary: 111 mg/dL — ABNORMAL HIGH (ref 70–99)

## 2013-09-01 SURGERY — PHACOEMULSIFICATION, CATARACT, WITH IOL INSERTION
Anesthesia: Monitor Anesthesia Care | Site: Eye | Laterality: Left

## 2013-09-01 MED ORDER — LIDOCAINE 3.5 % OP GEL OPTIME - NO CHARGE
OPHTHALMIC | Status: DC | PRN
  Administered 2013-09-01: 2 [drp] via OPHTHALMIC

## 2013-09-01 MED ORDER — BSS IO SOLN
INTRAOCULAR | Status: DC | PRN
  Administered 2013-09-01: 15 mL via INTRAOCULAR

## 2013-09-01 MED ORDER — BSS IO SOLN
INTRAOCULAR | Status: DC | PRN
  Administered 2013-09-01: 09:00:00

## 2013-09-01 MED ORDER — LIDOCAINE HCL 3.5 % OP GEL
1.0000 "application " | Freq: Once | OPHTHALMIC | Status: AC
  Administered 2013-09-01: 1 via OPHTHALMIC

## 2013-09-01 MED ORDER — NEOMYCIN-POLYMYXIN-DEXAMETH 3.5-10000-0.1 OP SUSP
OPHTHALMIC | Status: DC | PRN
  Administered 2013-09-01: 2 [drp] via OPHTHALMIC

## 2013-09-01 MED ORDER — PHENYLEPHRINE HCL 2.5 % OP SOLN
1.0000 [drp] | OPHTHALMIC | Status: AC
  Administered 2013-09-01 (×3): 1 [drp] via OPHTHALMIC

## 2013-09-01 MED ORDER — EPINEPHRINE HCL 1 MG/ML IJ SOLN
INTRAMUSCULAR | Status: AC
  Filled 2013-09-01: qty 1

## 2013-09-01 MED ORDER — FENTANYL CITRATE 0.05 MG/ML IJ SOLN: 25.0000 ug | INTRAMUSCULAR | Status: DC | PRN

## 2013-09-01 MED ORDER — LIDOCAINE HCL (PF) 1 % IJ SOLN
INTRAMUSCULAR | Status: DC | PRN
  Administered 2013-09-01: .6 mL

## 2013-09-01 MED ORDER — CYCLOPENTOLATE-PHENYLEPHRINE 0.2-1 % OP SOLN
1.0000 [drp] | OPHTHALMIC | Status: AC
  Administered 2013-09-01 (×3): 1 [drp] via OPHTHALMIC

## 2013-09-01 MED ORDER — FENTANYL CITRATE 0.05 MG/ML IJ SOLN
25.0000 ug | INTRAMUSCULAR | Status: AC
  Administered 2013-09-01 (×2): 25 ug via INTRAVENOUS
  Filled 2013-09-01: qty 2

## 2013-09-01 MED ORDER — LACTATED RINGERS IV SOLN
INTRAVENOUS | Status: DC
  Administered 2013-09-01: 08:00:00 via INTRAVENOUS

## 2013-09-01 MED ORDER — TETRACAINE HCL 0.5 % OP SOLN
1.0000 [drp] | OPHTHALMIC | Status: AC
  Administered 2013-09-01 (×3): 1 [drp] via OPHTHALMIC

## 2013-09-01 MED ORDER — MIDAZOLAM HCL 2 MG/2ML IJ SOLN
1.0000 mg | INTRAMUSCULAR | Status: DC | PRN
  Administered 2013-09-01: 2 mg via INTRAVENOUS
  Filled 2013-09-01: qty 2

## 2013-09-01 MED ORDER — LACTATED RINGERS IV SOLN
INTRAVENOUS | Status: DC | PRN
  Administered 2013-09-01: 07:00:00 via INTRAVENOUS

## 2013-09-01 MED ORDER — PROVISC 10 MG/ML IO SOLN
INTRAOCULAR | Status: DC | PRN
  Administered 2013-09-01: 0.85 mL via INTRAOCULAR

## 2013-09-01 MED ORDER — ONDANSETRON HCL 4 MG/2ML IJ SOLN: 4.0000 mg | Freq: Once | INTRAMUSCULAR | Status: DC | PRN

## 2013-09-01 MED ORDER — POVIDONE-IODINE 5 % OP SOLN
OPHTHALMIC | Status: DC | PRN
  Administered 2013-09-01: 1 via OPHTHALMIC

## 2013-09-01 SURGICAL SUPPLY — 32 items

## 2013-09-01 NOTE — Telephone Encounter (Signed)
Called pt to let her know that her monitor will come in the mail in a few days. This nurse told her instruction about monitor. Pt has an appointment with Dr. Gerilyn Pilgrimoonquah on 09/01/13 for headaches. Told pt we will call her of her results once read by MD.

## 2013-09-01 NOTE — Anesthesia Procedure Notes (Signed)
Procedure Name: MAC Date/Time: 09/01/2013 8:31 AM Performed by: Pernell DupreADAMS, Vickye Astorino A Pre-anesthesia Checklist: Patient identified, Timeout performed, Suction available and Patient being monitored Oxygen Delivery Method: Nasal cannula

## 2013-09-01 NOTE — Anesthesia Postprocedure Evaluation (Signed)
  Anesthesia Post-op Note  Patient: Kara [name]  Procedure(s) Performed: Procedure(s) with comments: CATARACT EXTRACTION PHACO AND INTRAOCULAR LENS PLACEMENT (IOC) (Left) - CDE:  14.13  Patient Location: Short Stay  Anesthesia Type:MAC  Level of Consciousness: awake, alert , oriented and patient cooperative  Airway and Oxygen Therapy: Patient Spontanous Breathing and Patient connected to nasal cannula oxygen  Post-op Pain: none  Post-op Assessment: Post-op Vital signs reviewed, Patient's Cardiovascular Status Stable, Respiratory Function Stable, Patent Airway, No signs of Nausea or vomiting and Pain level controlled  Post-op Vital Signs: Reviewed and stable  Complications: No apparent anesthesia complications

## 2013-09-01 NOTE — Discharge Instructions (Signed)
General Anesthesia, Adult °General anesthesia is a sleep-like state of non-feeling produced by medicines (anesthetics). General anesthesia prevents you from being alert and feeling pain during a medical procedure. Your caregiver may recommend general anesthesia if your procedure: °· Is long. °· Is painful or uncomfortable. °· Would be frightening to see or hear. °· Requires you to be still. °· Affects your breathing. °· Causes significant blood loss. °LET YOUR CAREGIVER KNOW ABOUT: °· Allergies to food or medicine. °· Medicines taken, including vitamins, herbs, eyedrops, over-the-counter medicines, and creams. °· Use of steroids (by mouth or creams). °· Previous problems with anesthetics or numbing medicines, including problems experienced by relatives. °· History of bleeding problems or blood clots. °· Previous surgeries and types of anesthetics received. °· Possibility of pregnancy, if this applies. °· Use of cigarettes, alcohol, or illegal drugs. °· Any health condition(s), especially diabetes, sleep apnea, and high blood pressure. °RISKS AND COMPLICATIONS °General anesthesia rarely causes complications. However, if complications do occur, they can be life-threatening. Complications include: °· A lung infection. °· A stroke. °· A heart attack. °· Waking up during the procedure. When this occurs, the patient may be unable to move and communicate that he or she is awake. The patient may feel severe pain. °Older adults and adults with serious medical problems are more likely to have complications than adults who are young and healthy. Some complications can be prevented by answering all of your caregiver's questions thoroughly and by following all pre-procedure instructions. It is important to tell your caregiver if any of the pre-procedure instructions, especially those related to diet, were not followed. Any food or liquid in the stomach can cause problems when you are under general anesthesia. °BEFORE THE  PROCEDURE °· Ask your caregiver if you will have to spend the night at the hospital. If you will not have to spend the night, arrange to have an adult drive you and stay with you for 24-hours. °· Follow your caregiver's instructions if you are taking dietary supplements or medicines. Your caregiver may tell you to stop taking them or to reduce your dosage. °· Do not smoke for as long as possible before your procedure. If possible, stop smoking 3 6 weeks before the procedure. °· Do not take new dietary supplements or medicines within 1 week of your procedure unless your caregiver approves them. °· Do not eat within 8 hours of your procedure or as directed by your caregiver. Drink only clear liquids, such as water, black coffee (without milk or cream), and fruit juices (without pulp). °· Do not drink within 3 hours of your procedure or as directed by your caregiver. °· You may brush your teeth on the morning of the procedure, but make sure to spit out the toothpaste and water when finished. °PROCEDURE  °You will receive anesthetics through a mask, through an intravenous (IV) access tube, or through both. A doctor who specializes in anesthesia (anesthesiologist) or a nurse who specializes in anesthesia (nurse anesthetist) or both will stay with you throughout the procedure to make sure you remain unconscious. He or she will also watch your blood pressure, pulse, and oxygen levels to make sure that the anesthetics do not cause any problems. Once you are asleep, a breathing tube or mask may be used to help you breathe. °AFTER THE PROCEDURE °You will wake up after the procedure is complete. You may be in the room where the procedure was performed or in a recovery area. You may have a sore throat if   a breathing tube was used. You may also feel: °· Dizzy. °· Weak. °· Drowsy. °· Confused. °· Nauseous. °· Cold. °These are all normal responses and can be expected to last for up to 24 hours after the procedure is complete. A  caregiver will tell you when you are ready to go home. This will usually be when you are fully awake and in stable condition. °Document Released: 10/17/2007 Document Revised: 06/26/2012 Document Reviewed: 11/08/2011 °Exit[employer]® Patient Information ©2014 [employer]. ° °

## 2013-09-01 NOTE — Preoperative (Signed)
Beta Blockers   Reason not to administer Beta Blockers:Not Applicable 

## 2013-09-01 NOTE — Anesthesia Preprocedure Evaluation (Signed)
Anesthesia Evaluation  Patient identified by MRN, date of birth, ID band Patient awake    Reviewed: Allergy & Precautions, H&P , NPO status , Patient's Chart, lab work & pertinent test results, reviewed documented beta blocker date and time   Airway Mallampati: III TM Distance: <3 FB     Dental  (+) Edentulous Upper and Edentulous Lower   Pulmonary asthma , pneumonia -, resolved, COPDformer smoker,  breath sounds clear to auscultation        Cardiovascular hypertension, Pt. on home beta blockers and Pt. on medications + CAD Rhythm:Regular Rate:Normal     Neuro/Psych  Headaches,    GI/Hepatic GERD-  Medicated and Controlled,  Endo/Other  diabetes, Type 2, Oral Hypoglycemic AgentsHypothyroidism   Renal/GU Renal disease     Musculoskeletal   Abdominal   Peds  Hematology  (+) anemia ,   Anesthesia Other Findings   Reproductive/Obstetrics                           Anesthesia Physical Anesthesia Plan  ASA: III  Anesthesia Plan: MAC   Post-op Pain Management:    Induction: Intravenous  Airway Management Planned: Nasal Cannula  Additional Equipment:   Intra-op Plan:   Post-operative Plan:   Informed Consent: I have reviewed the patients History and Physical, chart, labs and discussed the procedure including the risks, benefits and alternatives for the proposed anesthesia with the patient or authorized representative who has indicated his/her understanding and acceptance.     Plan Discussed with:   Anesthesia Plan Comments:         Anesthesia Quick Evaluation

## 2013-09-01 NOTE — Telephone Encounter (Signed)
Spoke with patient and informed that 14 day monitor will be mailed directly to her home.  As patient was given instruction, she informed me she felt like heart was beating out of her body.  Patient advises nurse checked her pulse 73 and when she went home it was 78.  Patient answered yes to experiencing dizziness and SOB.  Pt also states had cataract surgery this morning.    Transferred call to nurse.  Explained to patient, physician not available at this time.

## 2013-09-01 NOTE — H&P (Signed)
I have reviewed the H&P, the patient was re-examined, and I have identified no interval changes in medical condition and plan of care since the history and physical of record  

## 2013-09-01 NOTE — Op Note (Signed)
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  Assistants: None  Anesthesia: Topical with MAC  Indications: Painless, progressive loss of vision with compromise of daily activities.  Surgery: Cataract Extraction with Intraocular lens Implant Left Eye  Discription: The patient had dilating drops and viscous lidocaine placed into the left eye in the pre-op holding area. After transfer to the operating room, a time out was performed. The patient was then prepped and draped. Beginning with a 75 degree blade a paracentesis port was made at the surgeon's 2 o'clock position. The anterior chamber was then filled with 1% non-preserved lidocaine. This was followed by filling the anterior chamber with Provisc. A 2.134mm keratome blade was used to make a clear corneal incision at the temporal limbus. A bent cystatome needle was used to create a continuous tear capsulotomy. Hydrodissection was performed with balanced salt solution on a Fine canula. The lens nucleus was then removed using the phacoemulsification handpiece. Residual cortex was removed with the I&A handpiece. The anterior chamber and capsular bag were refilled with Provisc. A posterior chamber intraocular lens was placed into the capsular bag with it's injector. The implant was positioned with the Kuglan hook. The Provisc was then removed from the anterior chamber and capsular bag with the I&A handpiece. Stromal hydration of the main incision and paracentesis port was performed with BSS on a Fine canula. The wounds were tested for leak which was negative. The patient tolerated the procedure well. There were no operative complications. The patient was then transferred to the recovery room in stable condition.  Complications: None  Specimen: None  EBL: None  Prosthetic device: B&L enVista, MX60, power 22.0D, SN  [id number].

## 2013-09-01 NOTE — Transfer of Care (Signed)
Immediate Anesthesia Transfer of Care Note  Patient: Kara ShirtsCarolyn W Kassabian  Procedure(s) Performed: Procedure(s) with comments: CATARACT EXTRACTION PHACO AND INTRAOCULAR LENS PLACEMENT (IOC) (Left) - CDE:  14.13  Patient Location: Short Stay  Anesthesia Type:MAC  Level of Consciousness: awake, alert , oriented and patient cooperative  Airway & Oxygen Therapy: Patient Spontanous Breathing  Post-op Assessment: Report given to PACU RN and Post -op Vital signs reviewed and stable  Post vital signs: Reviewed and stable  Complications: No apparent anesthesia complications

## 2013-09-02 ENCOUNTER — Ambulatory Visit (INDEPENDENT_AMBULATORY_CARE_PROVIDER_SITE_OTHER): Payer: Medicare Other | Admitting: Internal Medicine

## 2013-09-02 ENCOUNTER — Encounter (HOSPITAL_COMMUNITY): Payer: Self-pay | Admitting: Ophthalmology

## 2013-09-02 NOTE — Telephone Encounter (Signed)
Pt concern noted in phone note on 09-01-13 via HD LPN

## 2013-09-05 ENCOUNTER — Telehealth: Payer: Self-pay | Admitting: Neurology

## 2013-09-05 NOTE — Telephone Encounter (Signed)
Pt states that she has had a severe headache since 05-27-13.  She was recently in the hospital for her headaches from 1-26 to 08-23-13.  She states that she had a terrible reaction to the prednisone that was prescribed.  She said when taking it she felt like her insides were burning, she had sweats and shakes and it felt as if her head was going to explode.  She was also prescribed gabapentin 300mg  1/day for a week by [place] in [place].  She doesn't think this it the right medicine or if it is the dosage is not high enough.  She asked if someone could call and schedule her an appointment with [name] ONLY as soon as possible. She doesn't think she can wait for the April appointment with [name].  Thank you

## 2013-09-05 NOTE — Telephone Encounter (Signed)
I called patient. The patient was given prednisone at the latter part of January 2015. The patient has had reaction to this, went to the hospital. The patient was seen by Dr. Harrel Carinaonqua as an outpatient, and she has been placed on gabapentin taking 300 mg at night. The headaches continue, primarily on the left side. The patient may need to go up to 300 mg twice daily, and eventually to 300 mg 3 times daily. The patient is on MS Contin as well taking 30 mg 3 times daily. The patient will call me if she continues to do poorly.

## 2013-09-05 NOTE — Telephone Encounter (Signed)
Patient would like to speak to doctor, so she can let him know what is going on.  The left side of her head hurts so bad when she is awake. She doesn't think the other neurologist has given her the right medicine.

## 2013-09-09 ENCOUNTER — Ambulatory Visit (HOSPITAL_COMMUNITY): Payer: Medicare Other | Admitting: Oncology

## 2013-09-10 ENCOUNTER — Telehealth (HOSPITAL_COMMUNITY): Payer: Self-pay | Admitting: *Deleted

## 2013-09-10 NOTE — Telephone Encounter (Signed)
I would defer to whomever is prescribing this supplement.  From a hematology standpoint, she does not need Vit E.  If she is just taking Vit E as a supplement to her diet, she can take 200-400 units daily.  If she is taking the medication for any other reason, I would defer to whoever asked her to take the medication.    Patient called asking how much vitamin E she should take. States she was told at her last appt to take this but she does not remember how much she was supposed to take. I can not find documentation of this. I will forward this to T. Kefalas PA

## 2013-09-10 NOTE — Telephone Encounter (Signed)
Patient called asking how much vitamin E she should take. States she was told at her last appt to take this but she does not remember how much she was supposed to take. I can not find documentation of this. I will forward this to T. Kefalas PA

## 2013-09-12 ENCOUNTER — Ambulatory Visit (INDEPENDENT_AMBULATORY_CARE_PROVIDER_SITE_OTHER): Payer: Medicare Other | Admitting: Neurology

## 2013-09-12 ENCOUNTER — Ambulatory Visit (HOSPITAL_COMMUNITY): Payer: Medicare Other | Admitting: Oncology

## 2013-09-12 ENCOUNTER — Encounter: Payer: Self-pay | Admitting: Neurology

## 2013-09-12 VITALS — BP 93/53 | HR 99

## 2013-09-12 DIAGNOSIS — H5460 Unqualified visual loss, one eye, unspecified: Secondary | ICD-10-CM

## 2013-09-12 DIAGNOSIS — H5462 Unqualified visual loss, left eye, normal vision right eye: Secondary | ICD-10-CM

## 2013-09-12 DIAGNOSIS — R51 Headache: Secondary | ICD-10-CM

## 2013-09-12 NOTE — Patient Instructions (Signed)

## 2013-09-12 NOTE — Progress Notes (Signed)
Reason for visit: Headache, visual loss  Kara [name] is an 68 y.o. female  History of present illness:  [name] is a 68 year old right-handed white female with a history of headaches that began following a fall at home, with trauma to the head. The patient struck the right temporal region, without loss of consciousness. The patient has had significant ongoing headache discomfort. The patient at one point was placed on prednisone, and this necessitated a hospitalization. The patient has also apparently had some chronic issues with intermittent anemia, oftentimes having a Hgb down to the 7.0 range. This has happened recently. The patient indicates that no GI source of blood loss has been noted. The patient has been set up for a blood transfusion next week. The patient recently underwent a cataract procedure on the left eye, and several days after the procedure, the patient was noted to have sudden visual loss in the left eye. This occurred around 09/01/2013. The patient was seen by her ophthalmologist, but no specific therapy was undertaken. The patient comes to this office for further evaluation. The patient continues to have ongoing headaches, left greater than right temporal in nature. The patient also reports some jaw discomfort, and indicates that this has been present since she fell and hit her head in November 2014. The patient reports no other numbness or weakness on the arms or legs. The patient feels weak and tremulous when she stands up. The patient has not had any blackouts or any falls.  Past Medical History  Diagnosis Date  . Type 2 diabetes mellitus   . Arthritis   . COPD (chronic obstructive pulmonary disease)     Home oxygen  . Palpitations     Reports history of atrial fibrillation, although none seen recently  . Coronary atherosclerosis of native coronary artery     Nonobstructive at catheterization 2008  . GERD (gastroesophageal reflux disease)   . Irritable bowel  syndrome   . Chronic anemia   . Hypothyroidism   . Hyperlipidemia   . Chronic edema   . Pneumonia   . Diabetes mellitus   . Hypertension   . Chronic pain   . Anemia 08/04/2011  . Renal insufficiency 08/07/2011  . Anemia of chronic disease 08/04/2011  . Bulging lumbar disc     3  . Headache(784.0) 07/30/2013  . Sprain of neck 07/30/2013  . Asthma   . Visual loss, left eye 09/12/2013    Past Surgical History  Procedure Laterality Date  . Cholecystectomy    . Appendectomy    . Foot surgery    . Abdominal hysterectomy    . Cataract extraction w/phaco Left 09/01/2013    Procedure: CATARACT EXTRACTION PHACO AND INTRAOCULAR LENS PLACEMENT (IOC);  Surgeon: Gemma PayorKerry Hunt, MD;  Location: [facility];  Service: Ophthalmology;  Laterality: Left;  CDE:  14.13    Family History  Problem Relation Age of Onset  . Hypertension    . Diabetes Brother   . Migraines Brother     Social history:  reports that she quit smoking about 17 years ago. Her smoking use included Cigarettes. She has a 48 pack-year smoking history. She has never used smokeless tobacco. She reports that she does not drink alcohol or use illicit drugs.    Allergies  Allergen Reactions  . Prednisone   . Codeine Nausea And Vomiting and Rash  . Vioxx [Rofecoxib]     Medications:  Current Outpatient Prescriptions on File Prior to Visit  Medication Sig Dispense  Refill  . acetaminophen (TYLENOL) 325 MG tablet Take 650 mg by mouth every 6 (six) hours as needed.        . ALPRAZolam (XANAX) 1 MG tablet Take 1 mg by mouth 3 (three) times daily.      . citalopram (CELEXA) 20 MG tablet Take 20 mg by mouth 2 (two) times daily.       . fenofibrate 160 MG tablet Take 160 mg by mouth at bedtime.      . furosemide (LASIX) 80 MG tablet Take 160 mg by mouth 2 (two) times daily.      [name] levothyroxine (SYNTHROID, LEVOTHROID) 100 MCG tablet Take 100 mcg by mouth daily.        . Linaclotide (LINZESS) 145 MCG CAPS capsule Take 1 capsule (145 mcg total) by  mouth daily.  30 capsule  5  . lisinopril (PRINIVIL,ZESTRIL) 2.5 MG tablet Take 1 tablet (2.5 mg total) by mouth daily.  30 tablet  5  . metFORMIN (GLUCOPHAGE) 1000 MG tablet Take 1,000 mg by mouth 2 (two) times daily with a meal.       . metoprolol tartrate (LOPRESSOR) 25 MG tablet Take 25 mg by mouth 3 (three) times daily.       [name] morphine (MS CONTIN) 30 MG 12 hr tablet Take 30 mg by mouth 3 (three) times daily. To treat pain associated with 3 bulging disc in back      . Nebulizer MISC by Does not apply route as directed.        . nitroGLYCERIN (NITROSTAT) 0.4 MG SL tablet Place 0.4 mg under the tongue every 5 (five) minutes as needed for chest pain.       . pantoprazole (PROTONIX) 40 MG tablet TAKE ONE TABLET TWICE DAILY BEFORE A MEAL  60 tablet  5  . polyethylene glycol (MIRALAX / GLYCOLAX) packet Take 17 g by mouth 2 (two) times daily.      . Potassium Chloride Crys CR (KLOR-CON M20 PO) Take 20 mEq by mouth 2 (two) times daily.       No current facility-administered medications on file prior to visit.    ROS:  Out of a complete 14 system review of symptoms, the patient complains only of the following symptoms, and all other reviewed systems are negative.  Visual loss, OS Headache Tremor  Blood pressure 93/53, pulse 99, weight 0 lb (0 kg).  Physical Exam  General: The patient is alert and cooperative at the time of the examination.  Skin: 2-3+ edema in the lower extremities below the knees is noted.   Neurologic Exam  Mental status: The patient is oriented x 3.  Cranial nerves: Facial symmetry is not present. The patient has ptosis of the left eye. Anisocoria is noted with the left pupil being 3 mm, right pupil being 4-5 mm. An afferent pupillary defect is noted on the left Speech is normal, no aphasia or dysarthria is noted. Extraocular movements are full. Visual fields are full with the right eye. The patient has inability to count fingers on the left. Discs are flat  bilaterally.  Motor: The patient has good strength in all 4 extremities.  Sensory examination: Soft touch sensation on the face, arms, and legs is symmetric.  Coordination: The patient has good finger-nose-finger and heel-to-shin bilaterally.  Gait and station: The patient is able to stand with assistance, and take a few steps with assistance. The patient comes into the office in a wheelchair.  Reflexes: Deep tendon reflexes are symmetric.  Assessment/Plan:  1. Posttraumatic headache  2. Visual loss, OS  3. Anemia  The patient has had temporal headaches since her fall with associated head trauma November 2014. 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 The patient needs to be evaluated for temporal arteritis given the history of headache and visual loss. The patient will be sent for a sedimentation rate today, and a MRI of the brain. A carotid Doppler study will be done to exclude the possibility of a carotid artery occlusion. The patient will followup in 3 months.  Marlan Palau MD 09/13/2013 5:17 PM  [facility] [address] [place], [place] [address]  Phone [phone] Fax [phone]

## 2013-09-15 ENCOUNTER — Telehealth: Payer: Self-pay | Admitting: Neurology

## 2013-09-15 ENCOUNTER — Encounter (HOSPITAL_COMMUNITY)
Admission: RE | Admit: 2013-09-15 | Discharge: 2013-09-15 | Disposition: A | Discharge status: Home / Self Care | Payer: Medicare Other | Source: Ambulatory Visit | Attending: Family Medicine | Admitting: Family Medicine

## 2013-09-15 DIAGNOSIS — M316 Other giant cell arteritis: Secondary | ICD-10-CM

## 2013-09-15 DIAGNOSIS — E119 Type 2 diabetes mellitus without complications: Secondary | ICD-10-CM | POA: Insufficient documentation

## 2013-09-15 DIAGNOSIS — I4891 Unspecified atrial fibrillation: Secondary | ICD-10-CM | POA: Insufficient documentation

## 2013-09-15 DIAGNOSIS — D638 Anemia in other chronic diseases classified elsewhere: Secondary | ICD-10-CM | POA: Insufficient documentation

## 2013-09-15 LAB — SEDIMENTATION RATE: Sed Rate: >140 mm/hr — ABNORMAL HIGH (ref 0–22)

## 2013-09-15 LAB — HEMOGLOBIN AND HEMATOCRIT, BLOOD
HEMATOCRIT: 23.1 % — AB (ref 36.0–46.0)
HEMOGLOBIN: 6.8 g/dL — AB (ref 12.0–15.0)

## 2013-09-15 LAB — PREPARE RBC (CROSSMATCH)

## 2013-09-15 MED ORDER — DEXAMETHASONE 2 MG PO TABS: 8.0000 mg | ORAL_TABLET | Freq: Every day | ORAL | Status: DC

## 2013-09-15 NOTE — Telephone Encounter (Signed)
I called patient. The sedimentation rate is extremely elevated, greater than 140. The patient has already lost vision in the left eye. The patient will be given a prescription for Decadron, and she had an extremely bad reaction to prednisone that necessitated a hospitalization. The patient unfortunately, we'll need to go on some sort of steroid. The patient will be set up for a temporal artery biopsy to be done as soon as possible. The patient is also running very low hemoglobin levels, and she will need a transfusion in the near future.

## 2013-09-15 NOTE — Progress Notes (Signed)
CRITICAL VALUE ALERT  Critical value received:  Hemoglobin 6.8 g/dl  Date of notification:  09/15/13  Time of notification:  1530  Critical value read back:yes  Nurse who received alert:  G. Cletus GashWitherspoon, RN  MD notified (1st page):  A. [name], MD  Time of first page:  1535  MD notified (2nd page):  Time of second page:  Responding MD:  Cherre BlancA. [name], MD  Time MD responded:  [phone]

## 2013-09-16 ENCOUNTER — Encounter (HOSPITAL_COMMUNITY): Admission: RE | Admit: 2013-09-16 | Payer: Medicare Other | Source: Ambulatory Visit

## 2013-09-16 ENCOUNTER — Telehealth: Payer: Self-pay | Admitting: Neurology

## 2013-09-16 NOTE — Progress Notes (Signed)
Results for Lanice ShirtsMEADOR, Dorthy W (MRN [id number]) as of 09/16/2013 09:18  Ref. Range 09/15/2013 14:45  Hemoglobin Latest Range: 12.0-15.0 g/dL 6.8 (LL)  HCT Latest Range: 36.0-46.0 % 23.1 (L)  Sed Rate Latest Range: 0-22 mm/hr >140 (H)   Mrs. Croke called this morning to say that she was unable to come for blood transfusion @ the [facility] today but would do her best to be here tomorrow (09/17/13)

## 2013-09-16 NOTE — Telephone Encounter (Signed)
Patient requesting a call back from a nurse regarding her doc appointments. She is confused on when and where she is to get a biopsy. Please call to advise.

## 2013-09-16 NOTE — Telephone Encounter (Signed)
Patient's sister calling concerning the results that you gave the patient. Sister wanted to know if you could give her a call concerning this matter. Please advise.

## 2013-09-16 NOTE — Telephone Encounter (Signed)
I called the patient again, spoke with her directly. The temporal artery biopsy will be set up hopefully this week. The patient will need to get started on steroids within 48 hours prior to the biopsy. I am concerned that the patient will not be a will tolerate the dexamethasone. The patient went to the hospital last time she took prednisone.

## 2013-09-16 NOTE — Telephone Encounter (Signed)
I called the sister, discussed the issues with temporal arteritis. The sister seems to understand, not sure the patient does.

## 2013-09-17 ENCOUNTER — Telehealth: Payer: Self-pay | Admitting: Neurology

## 2013-09-17 ENCOUNTER — Encounter (HOSPITAL_COMMUNITY)
Admission: RE | Admit: 2013-09-17 | Discharge: 2013-09-17 | Disposition: A | Discharge status: Home / Self Care | Payer: Medicare Other | Source: Ambulatory Visit | Attending: Family Medicine | Admitting: Family Medicine

## 2013-09-17 ENCOUNTER — Ambulatory Visit (HOSPITAL_COMMUNITY)
Admission: RE | Admit: 2013-09-17 | Discharge: 2013-09-17 | Disposition: A | Discharge status: Home / Self Care | Payer: Medicare Other | Source: Ambulatory Visit | Attending: Neurology | Admitting: Neurology

## 2013-09-17 ENCOUNTER — Encounter (HOSPITAL_COMMUNITY): Payer: Self-pay

## 2013-09-17 ENCOUNTER — Telehealth: Payer: Self-pay | Admitting: *Deleted

## 2013-09-17 DIAGNOSIS — R51 Headache: Secondary | ICD-10-CM | POA: Insufficient documentation

## 2013-09-17 DIAGNOSIS — I6529 Occlusion and stenosis of unspecified carotid artery: Secondary | ICD-10-CM | POA: Insufficient documentation

## 2013-09-17 DIAGNOSIS — H5462 Unqualified visual loss, left eye, normal vision right eye: Secondary | ICD-10-CM

## 2013-09-17 LAB — GLUCOSE, CAPILLARY: Glucose-Capillary: 155 mg/dL — ABNORMAL HIGH (ref 70–99)

## 2013-09-17 NOTE — Telephone Encounter (Signed)
Pt called from the hospital stated she is needing the Dr to call her concerning some medications. She is at the hospital for receiving blood, so I parked her to try to get [name] nurse to see if she could speak with her but no answer. When I went back to where I had her parked she was gone. If someone could call pt and advised of what she is needing. Thanks

## 2013-09-17 NOTE — Telephone Encounter (Signed)
Left message for patient about which medicine she is calling about.  After seeing phone notes it seems she is trying to get information about the Decadron.  Not sure exactly what she wants to know about the medication.

## 2013-09-17 NOTE — Telephone Encounter (Signed)
Spoke to patient and she understands her appointments.  She will have the biopsy at Dr. Jamse MeadHoxworth's office on Friday 09-19-13.

## 2013-09-17 NOTE — Telephone Encounter (Signed)
Kara [name] from [facility] calling about patient's Decadron script, patient is allergic to prednisone and Decadron is also a steroid so they want to make sure this is okay for her to take.

## 2013-09-17 NOTE — Telephone Encounter (Signed)
Per Dr Anne HahnWillis' note on 02/23: The patient will be given a prescription for Decadron, and she had an extremely bad reaction to prednisone that necessitated a hospitalization. The patient unfortunately, we'll need to go on some sort of steroid Note on 02/24: The patient will need to get started on steroids within 48 hours prior to the biopsy. I am concerned that the patient will not be a will tolerate the dexamethasone. The patient went to the hospital last time she took prednisone  I have faxed these notes to the pharmacy.

## 2013-09-17 NOTE — Telephone Encounter (Signed)
I called back to clarify.  Spoke with pharmacist.  She said they did not have 2mg  tabs in stock, they only had 4mg  tabs, so they updated the directions and quantity accordingly.  The dose remains the same, but they just wanted to let us know they dispensed 4mg  tabs with directions of 2 tabs (8mg ) daily rather than 2mg  four tabs (8mg ) daily.  Otherwise they would have to order 2mg  tabs, and there may be a delay getting those in stock due to the inclement weather.

## 2013-09-17 NOTE — Telephone Encounter (Signed)
I called patient. The carotid Doppler study did not show any evidence of left internal carotid artery disease, up to 50% to 69% on the right, will follow over time, recheck in one year.   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  Less than 50% stenosis in the left internal carotid artery.

## 2013-09-17 NOTE — Telephone Encounter (Signed)
[facility] calling about the Decadron script, states that the patient is in front of her and she only has 4 mg tablets of the Decadron. Pam states she will just cut the quantity and advise patient to take 2 tablets instead of 4. Please call and advise if this is okay.

## 2013-09-17 NOTE — Telephone Encounter (Signed)
I called the patient, left a message. I once again explained the purpose of the Decadron.

## 2013-09-17 NOTE — Telephone Encounter (Signed)
Left message for patient and have forwarded previous phone note about this medication to doctor.

## 2013-09-18 LAB — TYPE AND SCREEN
ABO/RH(D): O NEG
ANTIBODY SCREEN: NEGATIVE
UNIT DIVISION: 0
Unit division: 0
Unit division: 0

## 2013-09-19 ENCOUNTER — Ambulatory Visit (INDEPENDENT_AMBULATORY_CARE_PROVIDER_SITE_OTHER): Payer: Medicare Other | Admitting: General Surgery

## 2013-09-19 ENCOUNTER — Encounter (INDEPENDENT_AMBULATORY_CARE_PROVIDER_SITE_OTHER): Payer: Self-pay | Admitting: General Surgery

## 2013-09-19 ENCOUNTER — Other Ambulatory Visit (HOSPITAL_COMMUNITY): Payer: Medicare Other

## 2013-09-19 ENCOUNTER — Telehealth: Payer: Self-pay | Admitting: *Deleted

## 2013-09-19 ENCOUNTER — Encounter (HOSPITAL_COMMUNITY): Payer: Self-pay | Admitting: Pharmacy Technician

## 2013-09-19 VITALS — BP 106/68 | HR 71 | Temp 97.6°F | Ht 66.5 in | Wt 158.0 lb

## 2013-09-19 DIAGNOSIS — R51 Headache: Secondary | ICD-10-CM

## 2013-09-19 DIAGNOSIS — H53139 Sudden visual loss, unspecified eye: Secondary | ICD-10-CM

## 2013-09-19 MED ORDER — GABAPENTIN 300 MG PO CAPS: 300.0000 mg | ORAL_CAPSULE | Freq: Two times a day (BID) | ORAL | Status: DC

## 2013-09-19 NOTE — Telephone Encounter (Signed)
Noted receipt of end of service report for pt cardiac event monitor, placed on Dr Wyline MoodBranch desk for review, pt will be informed with results once reviewed

## 2013-09-19 NOTE — Telephone Encounter (Signed)
I called the patient. The gabapentin is low risk, if it helps the discomfort, it is time to continue this medication, and low on the dose on. I'll call in a prescription for 300 mg twice daily.

## 2013-09-19 NOTE — Telephone Encounter (Signed)
Patient is calling about taking gabapentin. Patient is wanting to know if she still needs to keep taking gabapentin 300 mg daily or should she increase the dose or try something else.  Please advise.

## 2013-09-19 NOTE — Progress Notes (Signed)
Chief complaint: Headache, vision loss, referred for temporal artery biopsy  History: Patient is a 68 year old female with multiple chronic medical problems who recently suffered sudden loss of vision in her left eye following cataract surgery. She was evaluated by [name] from neurology with a history from him as follows: [name] is a 68 year old right-handed white female with a history of headaches that began following a fall at home, with trauma to the head. The patient struck the right temporal region, without loss of consciousness. The patient has had significant ongoing headache discomfort. The patient at one point was placed on prednisone, and this necessitated a hospitalization. The patient has also apparently had some chronic issues with intermittent anemia, oftentimes having a Hgb down to the 7.0 range. This has happened recently. The patient indicates that no GI source of blood loss has been noted. The patient has been set up for a blood transfusion next week. The patient recently underwent a cataract procedure on the left eye, and several days after the procedure, the patient was noted to have sudden visual loss in the left eye. This occurred around 09/01/2013. The patient was seen by her ophthalmologist, but no specific therapy was undertaken. The patient comes to this office for further evaluation. The patient continues to have ongoing headaches, left greater than right temporal in nature. The patient also reports some jaw discomfort, and indicates that this has been present since she fell and hit her head in November 2014. The patient reports no other numbness or weakness on the arms or legs. The patient feels weak and tremulous when she stands up. The patient has not had any blackouts or any falls. She has subsequently had a carotid ultrasound showing moderate stenosis, MRI is pending, and sed rate was elevated at greater than 140. She is referred for temporal artery biopsy. She continues  to have left sided headaches and tenderness which is been present since November.  Past Medical History  Diagnosis Date  . Type 2 diabetes mellitus   . Arthritis   . COPD (chronic obstructive pulmonary disease)     Home oxygen  . Palpitations     Reports history of atrial fibrillation, although none seen recently  . Coronary atherosclerosis of native coronary artery     Nonobstructive at catheterization 2008  . GERD (gastroesophageal reflux disease)   . Irritable bowel syndrome   . Chronic anemia   . Hypothyroidism   . Hyperlipidemia   . Chronic edema   . Pneumonia   . Diabetes mellitus   . Hypertension   . Chronic pain   . Anemia 08/04/2011  . Renal insufficiency 08/07/2011  . Anemia of chronic disease 08/04/2011  . Bulging lumbar disc     3  . Headache(784.0) 07/30/2013  . Sprain of neck 07/30/2013  . Asthma   . Visual loss, left eye 09/12/2013   Past Surgical History  Procedure Laterality Date  . Cholecystectomy    . Appendectomy    . Foot surgery    . Abdominal hysterectomy    . Cataract extraction w/phaco Left 09/01/2013    Procedure: CATARACT EXTRACTION PHACO AND INTRAOCULAR LENS PLACEMENT (IOC);  Surgeon: Gemma PayorKerry Hunt, MD;  Location: [facility];  Service: Ophthalmology;  Laterality: Left;  CDE:  14.13   Current Outpatient Prescriptions  Medication Sig Dispense Refill  . acetaminophen (TYLENOL) 325 MG tablet Take 650 mg by mouth every 6 (six) hours as needed.        . ALPRAZolam (XANAX) 1 MG tablet  Take 1 mg by mouth 3 (three) times daily.      . citalopram (CELEXA) 20 MG tablet Take 20 mg by mouth 2 (two) times daily.       [name] dexamethasone (DECADRON) 2 MG tablet Take 4 tablets (8 mg total) by mouth daily.  120 tablet  1  . fenofibrate 160 MG tablet Take 160 mg by mouth at bedtime.      . furosemide (LASIX) 80 MG tablet Take 160 mg by mouth 2 (two) times daily.      [name] levothyroxine (SYNTHROID, LEVOTHROID) 100 MCG tablet Take 100 mcg by mouth daily.        . Linaclotide  (LINZESS) 145 MCG CAPS capsule Take 1 capsule (145 mcg total) by mouth daily.  30 capsule  5  . lisinopril (PRINIVIL,ZESTRIL) 2.5 MG tablet Take 1 tablet (2.5 mg total) by mouth daily.  30 tablet  5  . metFORMIN (GLUCOPHAGE) 1000 MG tablet Take 1,000 mg by mouth 2 (two) times daily with a meal.       . metoprolol tartrate (LOPRESSOR) 25 MG tablet Take 25 mg by mouth 3 (three) times daily.       [name] morphine (MS CONTIN) 30 MG 12 hr tablet Take 30 mg by mouth 3 (three) times daily. To treat pain associated with 3 bulging disc in back      . Nebulizer MISC by Does not apply route as directed.        . nitroGLYCERIN (NITROSTAT) 0.4 MG SL tablet Place 0.4 mg under the tongue every 5 (five) minutes as needed for chest pain.       . pantoprazole (PROTONIX) 40 MG tablet TAKE ONE TABLET TWICE DAILY BEFORE A MEAL  60 tablet  5  . polyethylene glycol (MIRALAX / GLYCOLAX) packet Take 17 g by mouth 2 (two) times daily.      . Potassium Chloride Crys CR (KLOR-CON M20 PO) Take 20 mEq by mouth 2 (two) times daily.       No current facility-administered medications for this visit.   Allergies  Allergen Reactions  . Prednisone   . Codeine Nausea And Vomiting and Rash  . Vioxx [Rofecoxib]    History  Substance Use Topics  . Smoking status: Former Smoker -- 1.00 packs/day for 48 years    Types: Cigarettes    Quit date: 12/28/1995  . Smokeless tobacco: Never Used  . Alcohol Use: No   Exam: BP 106/68  Pulse 71  Temp(Src) 97.6 F (36.4 C) (Temporal)  Ht 5' 6.5" (1.689 m)  Wt 158 lb (71.668 kg)  BMI 25.12 kg/m2 Gen.: Chronically ill-appearing Caucasian female, alert and oriented HEENT: There is tenderness in the left temple but also throughout the left scalp. I cannot feel a definite temporal artery pulse. Neurologic: She is alert and fully oriented. No gross motor deficits.  Assessment and plan: Patient with left-sided headaches and sudden loss of vision in the left eye an elevated sedimentation rate  at greater than 140. She is referred for left vertebral artery biopsy. I discussed the nature of the procedure with the patient and she understands it is purely diagnostic and not therapeutic. She understands that the majority of biopsies are unrevealing. Risks of bleeding and infection were discussed and understood. We'll schedule this as soon as possible under local anesthesia as an outpatient.

## 2013-09-19 NOTE — Patient Instructions (Signed)
Temporal Arteritis Arteries are blood vessels that carry blood from the heart to all parts of the body. Temporal arteritis is a swelling (inflammation) of certain large arteries. This usually affects arteries in the head and neck area, including arteries in the area on the side of the head, between the ears and eyes (temples). The condition can be very painful. It also can cause serious problems, even blindness. Early diagnosis and treatment is very important. CAUSES  Temporal arteritis results from the body reacting to injury or infection (inflammation). This may occur when the body's immune system (which fights germs and disease) makes a mistake. It attacks its own arteries. No one knows why this happens. However, certain things (risk factors) make it more likely that a person will develop temporal arteritis. They include:  Age. Most people with temporal arteritis are older than 50. The average age is 70.  Sex. Three times more women than men develop the condition.  Race and ethnic background. Caucasians are more likely to have temporal arteritis than other races. So are people whose families came from [place], [place] or [place]  Having polymyalgia rheumatica (PMR). This condition causes stiffness and pain in the joints of the neck, shoulders and hips. About 15% of people with PMR also have temporal arteritis. SYMPTOMS  Not everyone with temporal arteritis has the same symptoms. Some people have just one symptom. Others may have several. The most common symptom is a new headache, often in the temple region. Symptoms may show up in other parts of the body too.   Symptoms affecting the head may include:  Temporal arteries that feel hard or swollen. It may hurt when the temples are touched.  Pain when combing your hair, or when laying your head on a pillow.  Pain in the jaw when chewing.  Pain in the throat or tongue.  Visual problems, including sudden loss of  vision in one eye, or seeing double.  Symptoms in other parts of the body may include:  Fever.  Fatigue.  A dry cough.  Pain in the hips and shoulders.  Pain in the arms during exercise.  Depression.  Weight loss. DIAGNOSIS  Symptoms of temporal arteritis are similar to symptoms for other conditions. This can make it hard to tell if you have the condition. To be sure, your caregiver will ask about your symptoms and do a physical exam. Certain tests may be necessary, such as:   An exam of your temples. Often, the temporal arteries will be swollen and hard. This can be felt.  A complete blood count. This test shows how many red blood cells are in your blood. Most people with temporal arteritis do not have enough red blood cells (anemic).  Erythrocyte sedimentation (also called sed rate test). It measures inflammation in the body. Almost everyone with temporal arteritis has a high sed rate.  C reactive protein test. This also shows if there is inflammation.  Biopsy a temporal artery. This means the caregiver will take out a small piece of an artery. Then, it is checked under a microscope for inflammation. More than one biopsy may be needed. That is because inflammation can be in one part of an artery and not in others. Your caregiver may need to check more than one spot. TREATMENT  Starting treatment right away is very important. Often, you will need to see a specialist in immunologic diseases (rheumatologist). Goals of treatment include protecting your eyesight. Once vision is gone, it might not come   back. The normal treatment is medication. It usually works well and quickly. Most people start getting better in a few days. Medication options include:  Corticosteroids. These are powerful drugs that fight inflammation. These drugs are most often used to treat temporal arteritis.  Usually, a high dose is taken at first. After symptoms improve, a smaller dose is used. The goal is to take  the smallest dose possible and still control your symptoms. That is because using corticosteroids for a long time can cause problems. They can make muscles and bones weak. They can cause blood pressure to go up, and cause diabetes. Also, people often gain weight when they take corticosteroids. Corticosteroids may need to be taken for one or two years.  Several newer drugs are being tested to treat temporal arteritis. Researchers are testing to see if new drugs will work as well as corticosteroids, but cause fewer problems than them. Testing of these drugs is not yet complete.  Some specialists recommend low dose aspirin to prevent blood clots. HOME CARE INSTRUCTIONS   Take any corticosteroids that your caregiver prescribes. Follow the directions carefully.  Take any vitamins or supplements that your caregiver suggests. This may include vitamin D and calcium. They help keep your bones from becoming weak.  Keep all appointments for checkups. Your caregiver will watch for any problems from the medication. Checkups may include:  Periodic blood tests.  Bone density testing. This checks how strong or weak your bones are.  Blood pressure checks. If your blood pressure rises, you may need to take a drug to control it while you are taking corticosteroids.  Blood sugar checks. This is to be sure you are not developing diabetes. If you have diabetes, corticosteroid medications may make it worse and require increased treatment.  Exercise. First, talk with your caregiver about what would be OK for you to do. Aerobic exercise (which increases your heart rate) is usually suggested. It does not have to require a lot of energy. Walking is aerobic exercise. This type of exercise is good because it helps prevent bone loss. It also helps control your blood pressure.  Follow a healthy diet. The goal is to prevent bone damage and diabetes. Include good sources of protein in your diet. Also, include fruits,  vegetables and whole grains. Your caregiver can refer you to an expert on healthy eating (dietitian) for more advice. SEEK MEDICAL CARE IF:   The symptoms that lead to your diagnosis return.  You develop worsening fever, fatigue, headache, weight loss, or pain in your jaw.  You develop signs of infection. Infections can be worse if you are on corticosteroid medication. SEEK IMMEDIATE MEDICAL CARE IF:   Your eyesight changes.  Pain does not go away, even after taking pain medicine.  You feel pain in your chest.  Breathing is difficult.  One side of your face or body suddenly becomes weak or numb.  You develop a fever of more than 102 F (38.9 C). Document Released: 05/07/2009 Document Revised: 10/02/2011 Document Reviewed: 05/07/2009 Exit[employer] Patient Information 2014 [employer].  

## 2013-09-22 ENCOUNTER — Encounter (HOSPITAL_COMMUNITY)
Admission: RE | Disposition: A | Discharge status: Home / Self Care | Payer: Self-pay | Source: Ambulatory Visit | Attending: General Surgery

## 2013-09-22 ENCOUNTER — Telehealth: Payer: Self-pay | Admitting: Neurology

## 2013-09-22 ENCOUNTER — Encounter (HOSPITAL_COMMUNITY): Payer: Medicare Other | Admitting: Anesthesiology

## 2013-09-22 ENCOUNTER — Ambulatory Visit (HOSPITAL_COMMUNITY)
Admission: RE | Admit: 2013-09-22 | Discharge: 2013-09-22 | Disposition: A | Discharge status: Home / Self Care | Payer: Medicare Other | Source: Ambulatory Visit | Attending: General Surgery | Admitting: General Surgery

## 2013-09-22 ENCOUNTER — Ambulatory Visit (HOSPITAL_COMMUNITY): Payer: Medicare Other | Admitting: Anesthesiology

## 2013-09-22 ENCOUNTER — Ambulatory Visit (HOSPITAL_COMMUNITY): Payer: Medicare Other

## 2013-09-22 ENCOUNTER — Encounter (HOSPITAL_COMMUNITY): Payer: Self-pay | Admitting: *Deleted

## 2013-09-22 DIAGNOSIS — I10 Essential (primary) hypertension: Secondary | ICD-10-CM | POA: Insufficient documentation

## 2013-09-22 DIAGNOSIS — D638 Anemia in other chronic diseases classified elsewhere: Secondary | ICD-10-CM | POA: Insufficient documentation

## 2013-09-22 DIAGNOSIS — K589 Irritable bowel syndrome without diarrhea: Secondary | ICD-10-CM | POA: Insufficient documentation

## 2013-09-22 DIAGNOSIS — E785 Hyperlipidemia, unspecified: Secondary | ICD-10-CM | POA: Insufficient documentation

## 2013-09-22 DIAGNOSIS — I2510 Atherosclerotic heart disease of native coronary artery without angina pectoris: Secondary | ICD-10-CM | POA: Insufficient documentation

## 2013-09-22 DIAGNOSIS — Z9181 History of falling: Secondary | ICD-10-CM | POA: Insufficient documentation

## 2013-09-22 DIAGNOSIS — R51 Headache: Secondary | ICD-10-CM

## 2013-09-22 DIAGNOSIS — Z961 Presence of intraocular lens: Secondary | ICD-10-CM | POA: Insufficient documentation

## 2013-09-22 DIAGNOSIS — J449 Chronic obstructive pulmonary disease, unspecified: Secondary | ICD-10-CM | POA: Insufficient documentation

## 2013-09-22 DIAGNOSIS — E119 Type 2 diabetes mellitus without complications: Secondary | ICD-10-CM | POA: Insufficient documentation

## 2013-09-22 DIAGNOSIS — F411 Generalized anxiety disorder: Secondary | ICD-10-CM | POA: Insufficient documentation

## 2013-09-22 DIAGNOSIS — E039 Hypothyroidism, unspecified: Secondary | ICD-10-CM | POA: Insufficient documentation

## 2013-09-22 DIAGNOSIS — I6529 Occlusion and stenosis of unspecified carotid artery: Secondary | ICD-10-CM | POA: Insufficient documentation

## 2013-09-22 DIAGNOSIS — H5460 Unqualified visual loss, one eye, unspecified: Secondary | ICD-10-CM | POA: Insufficient documentation

## 2013-09-22 DIAGNOSIS — M316 Other giant cell arteritis: Secondary | ICD-10-CM | POA: Diagnosis not present

## 2013-09-22 DIAGNOSIS — K219 Gastro-esophageal reflux disease without esophagitis: Secondary | ICD-10-CM | POA: Insufficient documentation

## 2013-09-22 DIAGNOSIS — J4489 Other specified chronic obstructive pulmonary disease: Secondary | ICD-10-CM | POA: Insufficient documentation

## 2013-09-22 DIAGNOSIS — R609 Edema, unspecified: Secondary | ICD-10-CM | POA: Insufficient documentation

## 2013-09-22 DIAGNOSIS — M129 Arthropathy, unspecified: Secondary | ICD-10-CM | POA: Insufficient documentation

## 2013-09-22 DIAGNOSIS — M5126 Other intervertebral disc displacement, lumbar region: Secondary | ICD-10-CM | POA: Insufficient documentation

## 2013-09-22 DIAGNOSIS — G8929 Other chronic pain: Secondary | ICD-10-CM | POA: Insufficient documentation

## 2013-09-22 DIAGNOSIS — Z87891 Personal history of nicotine dependence: Secondary | ICD-10-CM | POA: Insufficient documentation

## 2013-09-22 DIAGNOSIS — Z9849 Cataract extraction status, unspecified eye: Secondary | ICD-10-CM | POA: Insufficient documentation

## 2013-09-22 HISTORY — PX: ARTERY BIOPSY: SHX891

## 2013-09-22 HISTORY — DX: Claustrophobia: F40.240

## 2013-09-22 LAB — BASIC METABOLIC PANEL
BUN: 36 mg/dL — ABNORMAL HIGH (ref 6–23)
CALCIUM: 9.8 mg/dL (ref 8.4–10.5)
CO2: 36 meq/L — AB (ref 19–32)
Chloride: 98 mEq/L (ref 96–112)
Creatinine, Ser: 1.2 mg/dL — ABNORMAL HIGH (ref 0.50–1.10)
GFR calc Af Amer: 53 mL/min — ABNORMAL LOW (ref >90)
GFR calc non Af Amer: 46 mL/min — ABNORMAL LOW (ref >90)
GLUCOSE: 91 mg/dL (ref 70–99)
POTASSIUM: 4.6 meq/L (ref 3.7–5.3)
SODIUM: 143 meq/L (ref 137–147)

## 2013-09-22 LAB — CBC
HCT: 31.8 % — ABNORMAL LOW (ref 36.0–46.0)
Hemoglobin: 9.6 g/dL — ABNORMAL LOW (ref 12.0–15.0)
MCH: 29.4 pg (ref 26.0–34.0)
MCHC: 30.2 g/dL (ref 30.0–36.0)
MCV: 97.2 fL (ref 78.0–100.0)
PLATELETS: 359 10*3/uL (ref 150–400)
RBC: 3.27 MIL/uL — AB (ref 3.87–5.11)
RDW: 14.1 % (ref 11.5–15.5)
WBC: 8.7 10*3/uL (ref 4.0–10.5)

## 2013-09-22 LAB — GLUCOSE, CAPILLARY: Glucose-Capillary: 98 mg/dL (ref 70–99)

## 2013-09-22 SURGERY — BIOPSY TEMPORAL ARTERY
Anesthesia: Monitor Anesthesia Care | Site: Head | Laterality: Left

## 2013-09-22 MED ORDER — BACITRACIN ZINC 500 UNIT/GM EX OINT
TOPICAL_OINTMENT | CUTANEOUS | Status: AC
  Filled 2013-09-22: qty 15

## 2013-09-22 MED ORDER — HYDROCODONE-ACETAMINOPHEN 5-325 MG PO TABS: 1.0000 | ORAL_TABLET | ORAL | Status: DC | PRN

## 2013-09-22 MED ORDER — LACTATED RINGERS IV SOLN
INTRAVENOUS | Status: DC
  Administered 2013-09-22: 50 mL/h via INTRAVENOUS

## 2013-09-22 MED ORDER — 0.9 % SODIUM CHLORIDE (POUR BTL) OPTIME
TOPICAL | Status: DC | PRN
  Administered 2013-09-22: 1000 mL

## 2013-09-22 MED ORDER — SODIUM BICARBONATE 4 % IV SOLN
INTRAVENOUS | Status: AC
  Filled 2013-09-22: qty 5

## 2013-09-22 MED ORDER — LACTATED RINGERS IV SOLN
INTRAVENOUS | Status: DC | PRN
  Administered 2013-09-22: 10:00:00 via INTRAVENOUS

## 2013-09-22 MED ORDER — BACITRACIN-NEOMYCIN-POLYMYXIN 400-5-5000 EX OINT
TOPICAL_OINTMENT | CUTANEOUS | Status: AC
  Filled 2013-09-22: qty 1

## 2013-09-22 MED ORDER — METOPROLOL TARTRATE 25 MG PO TABS
25.0000 mg | ORAL_TABLET | Freq: Once | ORAL | Status: AC
  Administered 2013-09-22: 25 mg via ORAL

## 2013-09-22 MED ORDER — MIDAZOLAM HCL 2 MG/2ML IJ SOLN
INTRAMUSCULAR | Status: AC
  Filled 2013-09-22: qty 2

## 2013-09-22 MED ORDER — LIDOCAINE-EPINEPHRINE 2 %-1:100000 IJ SOLN
INTRAMUSCULAR | Status: DC | PRN
  Administered 2013-09-22: 20 mL

## 2013-09-22 MED ORDER — ONDANSETRON HCL 4 MG/2ML IJ SOLN: 4.0000 mg | Freq: Four times a day (QID) | INTRAMUSCULAR | Status: DC | PRN

## 2013-09-22 MED ORDER — OXYCODONE HCL 5 MG/5ML PO SOLN: 5.0000 mg | Freq: Once | ORAL | Status: DC | PRN

## 2013-09-22 MED ORDER — SODIUM BICARBONATE 4 % IV SOLN
INTRAVENOUS | Status: DC | PRN
  Administered 2013-09-22: 5 mL via INTRAVENOUS

## 2013-09-22 MED ORDER — FENTANYL CITRATE 0.05 MG/ML IJ SOLN
INTRAMUSCULAR | Status: AC
  Filled 2013-09-22: qty 5

## 2013-09-22 MED ORDER — OXYCODONE HCL 5 MG PO TABS: 5.0000 mg | ORAL_TABLET | Freq: Once | ORAL | Status: DC | PRN

## 2013-09-22 MED ORDER — LIDOCAINE-EPINEPHRINE 2 %-1:100000 IJ SOLN
INTRAMUSCULAR | Status: AC
  Filled 2013-09-22: qty 1

## 2013-09-22 MED ORDER — METOPROLOL TARTRATE 12.5 MG HALF TABLET
ORAL_TABLET | ORAL | Status: AC
  Filled 2013-09-22: qty 2

## 2013-09-22 MED ORDER — PROPOFOL 10 MG/ML IV BOLUS
INTRAVENOUS | Status: AC
  Filled 2013-09-22: qty 20

## 2013-09-22 MED ORDER — FENTANYL CITRATE 0.05 MG/ML IJ SOLN: 25.0000 ug | INTRAMUSCULAR | Status: DC | PRN

## 2013-09-22 SURGICAL SUPPLY — 45 items
BLADE SURG 15 STRL LF DISP TIS (BLADE) ×1 IMPLANT
BLADE SURG 15 STRL SS (BLADE) ×3
CHLORAPREP W/TINT 10.5 ML (MISCELLANEOUS) ×3 IMPLANT
CONT SPEC 4OZ CLIKSEAL STRL BL (MISCELLANEOUS) ×2 IMPLANT
COVER SURGICAL LIGHT HANDLE (MISCELLANEOUS) ×3 IMPLANT
CRADLE DONUT ADULT HEAD (MISCELLANEOUS) ×3 IMPLANT
DECANTER SPIKE VIAL GLASS SM (MISCELLANEOUS) ×1 IMPLANT
DRAPE PED LAPAROTOMY (DRAPES) ×3 IMPLANT
DRAPE UTILITY 15X26 W/TAPE STR (DRAPE) ×6 IMPLANT
ELECT NDL TIP 2.8 STRL (NEEDLE) ×1 IMPLANT
ELECT NEEDLE TIP 2.8 STRL (NEEDLE) ×3 IMPLANT
ELECT REM PT RETURN 9FT ADLT (ELECTROSURGICAL) ×3
ELECTRODE REM PT RTRN 9FT ADLT (ELECTROSURGICAL) ×1 IMPLANT
GAUZE SPONGE 4X4 16PLY XRAY LF (GAUZE/BANDAGES/DRESSINGS) ×3 IMPLANT
GLOVE BIO SURGEON STRL SZ7.5 (GLOVE) ×2 IMPLANT
GLOVE BIOGEL PI IND STRL 7.0 (GLOVE) IMPLANT
GLOVE BIOGEL PI IND STRL 7.5 (GLOVE) IMPLANT
GLOVE BIOGEL PI IND STRL 8 (GLOVE) ×1 IMPLANT
GLOVE BIOGEL PI INDICATOR 7.0 (GLOVE) ×2
GLOVE BIOGEL PI INDICATOR 7.5 (GLOVE) ×2
GLOVE BIOGEL PI INDICATOR 8 (GLOVE) ×2
GLOVE SS BIOGEL STRL SZ 7.5 (GLOVE) ×1 IMPLANT
GLOVE SUPERSENSE BIOGEL SZ 7.5 (GLOVE) ×2
GLOVE SURG SS PI 7.0 STRL IVOR (GLOVE) ×2 IMPLANT
GOWN STRL NON-REIN LRG LVL3 (GOWN DISPOSABLE) ×3 IMPLANT
GOWN STRL REIN XL XLG (GOWN DISPOSABLE) ×3 IMPLANT
KIT BASIN OR (CUSTOM PROCEDURE TRAY) ×3 IMPLANT
KIT ROOM TURNOVER OR (KITS) ×3 IMPLANT
MARKER SKIN DUAL TIP RULER LAB (MISCELLANEOUS) ×2 IMPLANT
NDL HYPO 30X.5 LL (NEEDLE) ×1 IMPLANT
NEEDLE HYPO 30X.5 LL (NEEDLE) ×3 IMPLANT
NS IRRIG 1000ML POUR BTL (IV SOLUTION) ×3 IMPLANT
PACK SURGICAL SETUP 50X90 (CUSTOM PROCEDURE TRAY) ×3 IMPLANT
PAD ARMBOARD 7.5X6 YLW CONV (MISCELLANEOUS) ×6 IMPLANT
PENCIL BUTTON HOLSTER BLD 10FT (ELECTRODE) ×3 IMPLANT
SPECIMEN JAR SMALL (MISCELLANEOUS) ×3 IMPLANT
SUT ETHILON 5 0 PS 2 18 (SUTURE) ×3 IMPLANT
SUT SILK 3 0 (SUTURE) ×3
SUT SILK 3-0 18XBRD TIE 12 (SUTURE) ×1 IMPLANT
SUT VICRYL 4-0 PS2 18IN ABS (SUTURE) ×2 IMPLANT
SYR BULB 3OZ (MISCELLANEOUS) IMPLANT
SYR CONTROL 10ML LL (SYRINGE) ×3 IMPLANT
TOWEL OR 17X24 6PK STRL BLUE (TOWEL DISPOSABLE) ×3 IMPLANT
TOWEL OR 17X26 10 PK STRL BLUE (TOWEL DISPOSABLE) ×3 IMPLANT
WATER STERILE IRR 1000ML POUR (IV SOLUTION) IMPLANT

## 2013-09-22 NOTE — Anesthesia Postprocedure Evaluation (Signed)
Anesthesia Post Note  Patient: Kara ShirtsCarolyn [name]  Procedure(s) Performed: Procedure(s) (LRB): BIOPSY TEMPORAL ARTERY (Left)  Anesthesia type: MAC  Patient location: PACU  Post pain: Pain level controlled and Adequate analgesia  Post assessment: Post-op Vital signs reviewed, Patient's Cardiovascular Status Stable and Respiratory Function Stable  Last Vitals:  Filed Vitals:   09/22/13 1230  BP: 136/48  Pulse: 74  Temp: 36.2 C  Resp: 13    Post vital signs: Reviewed and stable  Level of consciousness: awake, alert  and oriented  Complications: No apparent anesthesia complications

## 2013-09-22 NOTE — H&P (View-Only) (Signed)
Chief complaint: Headache, vision loss, referred for temporal artery biopsy  History: Patient is a 68 year old female with multiple chronic medical problems who recently suffered sudden loss of vision in her left eye following cataract surgery. She was evaluated by [name] from neurology with a history from him as follows: [name] is a 68 year old right-handed white female with a history of headaches that began following a fall at home, with trauma to the head. The patient struck the right temporal region, without loss of consciousness. The patient has had significant ongoing headache discomfort. The patient at one point was placed on prednisone, and this necessitated a hospitalization. The patient has also apparently had some chronic issues with intermittent anemia, oftentimes having a Hgb down to the 7.0 range. This has happened recently. The patient indicates that no GI source of blood loss has been noted. The patient has been set up for a blood transfusion next week. The patient recently underwent a cataract procedure on the left eye, and several days after the procedure, the patient was noted to have sudden visual loss in the left eye. This occurred around 09/01/2013. The patient was seen by her ophthalmologist, but no specific therapy was undertaken. The patient comes to this office for further evaluation. The patient continues to have ongoing headaches, left greater than right temporal in nature. The patient also reports some jaw discomfort, and indicates that this has been present since she fell and hit her head in November 2014. The patient reports no other numbness or weakness on the arms or legs. The patient feels weak and tremulous when she stands up. The patient has not had any blackouts or any falls. She has subsequently had a carotid ultrasound showing moderate stenosis, MRI is pending, and sed rate was elevated at greater than 140. She is referred for temporal artery biopsy. She continues  to have left sided headaches and tenderness which is been present since November.  Past Medical History  Diagnosis Date  . Type 2 diabetes mellitus   . Arthritis   . COPD (chronic obstructive pulmonary disease)     Home oxygen  . Palpitations     Reports history of atrial fibrillation, although none seen recently  . Coronary atherosclerosis of native coronary artery     Nonobstructive at catheterization 2008  . GERD (gastroesophageal reflux disease)   . Irritable bowel syndrome   . Chronic anemia   . Hypothyroidism   . Hyperlipidemia   . Chronic edema   . Pneumonia   . Diabetes mellitus   . Hypertension   . Chronic pain   . Anemia 08/04/2011  . Renal insufficiency 08/07/2011  . Anemia of chronic disease 08/04/2011  . Bulging lumbar disc     3  . Headache(784.0) 07/30/2013  . Sprain of neck 07/30/2013  . Asthma   . Visual loss, left eye 09/12/2013   Past Surgical History  Procedure Laterality Date  . Cholecystectomy    . Appendectomy    . Foot surgery    . Abdominal hysterectomy    . Cataract extraction w/phaco Left 09/01/2013    Procedure: CATARACT EXTRACTION PHACO AND INTRAOCULAR LENS PLACEMENT (IOC);  Surgeon: Gemma PayorKerry Hunt, MD;  Location: [facility];  Service: Ophthalmology;  Laterality: Left;  CDE:  14.13   Current Outpatient Prescriptions  Medication Sig Dispense Refill  . acetaminophen (TYLENOL) 325 MG tablet Take 650 mg by mouth every 6 (six) hours as needed.        . ALPRAZolam (XANAX) 1 MG tablet  Take 1 mg by mouth 3 (three) times daily.      . citalopram (CELEXA) 20 MG tablet Take 20 mg by mouth 2 (two) times daily.       [name] dexamethasone (DECADRON) 2 MG tablet Take 4 tablets (8 mg total) by mouth daily.  120 tablet  1  . fenofibrate 160 MG tablet Take 160 mg by mouth at bedtime.      . furosemide (LASIX) 80 MG tablet Take 160 mg by mouth 2 (two) times daily.      [name] levothyroxine (SYNTHROID, LEVOTHROID) 100 MCG tablet Take 100 mcg by mouth daily.        . Linaclotide  (LINZESS) 145 MCG CAPS capsule Take 1 capsule (145 mcg total) by mouth daily.  30 capsule  5  . lisinopril (PRINIVIL,ZESTRIL) 2.5 MG tablet Take 1 tablet (2.5 mg total) by mouth daily.  30 tablet  5  . metFORMIN (GLUCOPHAGE) 1000 MG tablet Take 1,000 mg by mouth 2 (two) times daily with a meal.       . metoprolol tartrate (LOPRESSOR) 25 MG tablet Take 25 mg by mouth 3 (three) times daily.       [name] morphine (MS CONTIN) 30 MG 12 hr tablet Take 30 mg by mouth 3 (three) times daily. To treat pain associated with 3 bulging disc in back      . Nebulizer MISC by Does not apply route as directed.        . nitroGLYCERIN (NITROSTAT) 0.4 MG SL tablet Place 0.4 mg under the tongue every 5 (five) minutes as needed for chest pain.       . pantoprazole (PROTONIX) 40 MG tablet TAKE ONE TABLET TWICE DAILY BEFORE A MEAL  60 tablet  5  . polyethylene glycol (MIRALAX / GLYCOLAX) packet Take 17 g by mouth 2 (two) times daily.      . Potassium Chloride Crys CR (KLOR-CON M20 PO) Take 20 mEq by mouth 2 (two) times daily.       No current facility-administered medications for this visit.   Allergies  Allergen Reactions  . Prednisone   . Codeine Nausea And Vomiting and Rash  . Vioxx [Rofecoxib]    History  Substance Use Topics  . Smoking status: Former Smoker -- 1.00 packs/day for 48 years    Types: Cigarettes    Quit date: 12/28/1995  . Smokeless tobacco: Never Used  . Alcohol Use: No   Exam: BP 106/68  Pulse 71  Temp(Src) 97.6 F (36.4 C) (Temporal)  Ht 5' 6.5" (1.689 m)  Wt 158 lb (71.668 kg)  BMI 25.12 kg/m2 Gen.: Chronically ill-appearing Caucasian female, alert and oriented HEENT: There is tenderness in the left temple but also throughout the left scalp. I cannot feel a definite temporal artery pulse. Neurologic: She is alert and fully oriented. No gross motor deficits.  Assessment and plan: Patient with left-sided headaches and sudden loss of vision in the left eye an elevated sedimentation rate  at greater than 140. She is referred for left vertebral artery biopsy. I discussed the nature of the procedure with the patient and she understands it is purely diagnostic and not therapeutic. She understands that the majority of biopsies are unrevealing. Risks of bleeding and infection were discussed and understood. We'll schedule this as soon as possible under local anesthesia as an outpatient.

## 2013-09-22 NOTE — Discharge Instructions (Signed)
May shower tomorrow. Pat wound dry.  Call as needed for bleeding, drainage, redness or other concerns.  [facility] [phone]   What to eat:  For your first meals, you should eat lightly; only small meals initially.  If you do not have nausea, you may eat larger meals.  Avoid spicy, greasy and heavy food.    General Anesthesia, Adult, Care After  Refer to this sheet in the next few weeks. These instructions provide you with information on caring for yourself after your procedure. Your health care provider may also give you more specific instructions. Your treatment has been planned according to current medical practices, but problems sometimes occur. Call your health care provider if you have any problems or questions after your procedure.  WHAT TO EXPECT AFTER THE PROCEDURE  After the procedure, it is typical to experience:  Sleepiness.  Nausea and vomiting. HOME CARE INSTRUCTIONS  For the first 24 hours after general anesthesia:  Have a responsible person with you.  Do not drive a car. If you are alone, do not take public transportation.  Do not drink alcohol.  Do not take medicine that has not been prescribed by your health care provider.  Do not sign important papers or make important decisions.  You may resume a normal diet and activities as directed by your health care provider.  Change bandages (dressings) as directed.  If you have questions or problems that seem related to general anesthesia, call the hospital and ask for the anesthetist or anesthesiologist on call. SEEK MEDICAL CARE IF:  You have nausea and vomiting that continue the day after anesthesia.  You develop a rash. SEEK IMMEDIATE MEDICAL CARE IF:  You have difficulty breathing.  You have chest pain.  You have any allergic problems. Document Released: 10/16/2000 Document Revised: 03/12/2013 Document Reviewed: 01/23/2013  [facility] Patient Information 2014 [employer] [place].

## 2013-09-22 NOTE — Telephone Encounter (Signed)
I called patient. The patient is on 4 mg of Decadron in the morning, tolerating the medication well. The patient had the temporal artery biopsy this morning. The patient will go up to 4 mg twice daily on the Decadron. The patient is still having headaches.

## 2013-09-22 NOTE — Anesthesia Preprocedure Evaluation (Signed)
Anesthesia Evaluation  Patient identified by MRN, date of birth, ID band Patient awake    Reviewed: Allergy & Precautions, H&P , NPO status , Patient's Chart, lab work & pertinent test results  Airway Mallampati: II  Neck ROM: full    Dental   Pulmonary asthma , COPDformer smoker,          Cardiovascular hypertension, + CAD     Neuro/Psych  Headaches, Anxiety    GI/Hepatic GERD-  ,  Endo/Other  diabetes, Type 2Hypothyroidism   Renal/GU Renal InsufficiencyRenal disease     Musculoskeletal  (+) Arthritis -,   Abdominal   Peds  Hematology   Anesthesia Other Findings   Reproductive/Obstetrics                           Anesthesia Physical Anesthesia Plan  ASA: III  Anesthesia Plan: MAC   Post-op Pain Management:    Induction: Intravenous  Airway Management Planned: Simple Face Mask  Additional Equipment:   Intra-op Plan:   Post-operative Plan:   Informed Consent: I have reviewed the patients History and Physical, chart, labs and discussed the procedure including the risks, benefits and alternatives for the proposed anesthesia with the patient or authorized representative who has indicated his/her understanding and acceptance.     Plan Discussed with: CRNA, Anesthesiologist and Surgeon  Anesthesia Plan Comments:         Anesthesia Quick Evaluation

## 2013-09-22 NOTE — Op Note (Signed)
Preoperative Diagnosis: headaches, vision loss, elevated ESR, rule out temporal arteritis  Postoprative Diagnosis: same  Procedure: Procedure(s): BIOPSY TEMPORAL ARTERY, left   Surgeon: Excell Seltzer T   Assistants: none  Anesthesia:  Local anesthesia 2% buffered lidocaine  Indications: patient is a 68 year old female with a constellation of findings including severe left-sided headaches, vision loss and elevated ESR he was referred by her neurologist, Dr. Jannifer Franklin, for temporal artery biopsy. I discussed the procedure and indications and risks with her detail the extensively elsewhere and she is now brought to the operating room for this procedure.  Procedure Detail:  Patient was brought to the operating room, placed in supine position the operating table. The left temple was clipped of some hair and sterilely prepped and draped. Patient timeout was performed and correct patient and procedure verified. Local anesthesia was used to infiltrate the skin overlying the left temple where was able to palpate the temporal artery pulse and the underlying soft tissue was anesthetized. An incision was made in front of the left ear over the palpable pulse and dissection carried down sharply through the subcutaneous tissue and superficial fascia. Using careful blunt dissection I was able to expose the artery it was encircled with a 3-0 silk ties. Using this for retraction the incision was extended somewhat possibly and distally and I sharply and bluntly dissected out at least 2 cm of the artery in total. At each end it was clamped and cut and ligated with 3-0 silk. The specimen was sent for permanent pathology. The hemostasis was obtained. The subcutaneous was closed with interrupted 3-0 Vicryl and the skin with running 5-0 nylon. Sponge needle instrument counts were correct. The patient tolerated the procedure well.          Specimens: section of left temporal artery        Complications:  * No  complications entered in OR log *         Disposition: PACU - hemodynamically stable.         Condition: stable

## 2013-09-22 NOTE — Interval H&P Note (Signed)
History and Physical Interval Note:  09/22/2013 10:17 AM  Kara [name]  has presented today for surgery, with the diagnosis of headaches  The various methods of treatment have been discussed with the patient and family. After consideration of risks, benefits and other options for treatment, the patient has consented to  Procedure(s): BIOPSY TEMPORAL ARTERY (N/A) as a surgical intervention .  The patient's history has been reviewed, patient examined, no change in status, stable for surgery.  I have reviewed the patient's chart and labs.  Questions were answered to the patient's satisfaction.     [name]

## 2013-09-22 NOTE — Telephone Encounter (Signed)
Patient called stating that she is taking 2 dexamethasone in the morning (8 mg) and is tolerating it well. Should is questioning if she should increase the evening dose? Please call to advise.

## 2013-09-22 NOTE — Preoperative (Signed)
Beta Blockers   Reason not to administer Beta Blockers:Not Applicable 

## 2013-09-22 NOTE — Transfer of Care (Signed)
Immediate Anesthesia Transfer of Care Note  Patient: Kara ShirtsCarolyn W Hankerson  Procedure(s) Performed: Procedure(s): BIOPSY TEMPORAL ARTERY (Left)  Patient Location: PACU  Anesthesia Type:MAC  Level of Consciousness: awake, alert , oriented and sedated  Airway & Oxygen Therapy: Patient Spontanous Breathing and Patient connected to nasal cannula oxygen  Post-op Assessment: Report given to PACU RN  Post vital signs: Reviewed and stable  Complications: No apparent anesthesia complications

## 2013-09-23 ENCOUNTER — Telehealth (INDEPENDENT_AMBULATORY_CARE_PROVIDER_SITE_OTHER): Payer: Self-pay | Admitting: General Surgery

## 2013-09-23 NOTE — Telephone Encounter (Signed)
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

## 2013-09-24 ENCOUNTER — Other Ambulatory Visit: Payer: Self-pay

## 2013-09-24 ENCOUNTER — Encounter (HOSPITAL_COMMUNITY): Payer: Self-pay | Admitting: General Surgery

## 2013-09-24 ENCOUNTER — Other Ambulatory Visit: Payer: Self-pay | Admitting: *Deleted

## 2013-09-24 DIAGNOSIS — R002 Palpitations: Secondary | ICD-10-CM

## 2013-09-25 ENCOUNTER — Telehealth (INDEPENDENT_AMBULATORY_CARE_PROVIDER_SITE_OTHER): Payer: Self-pay | Admitting: *Deleted

## 2013-09-25 ENCOUNTER — Telehealth: Payer: Self-pay | Admitting: Neurology

## 2013-09-25 NOTE — Telephone Encounter (Signed)
I called the patient. 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 The patient however, indicates that she is only on 4 mg daily of Decadron. I have urged her to increase the dose to 6 mg a day for 5 days, and then go to 8 mg daily. The patient is still having some intermittent headaches suggesting that the inflammatory process is still present. The patient indicates that she is claustrophobic, but she indicates that she believes that she can get through the MRI. I will see her back in about one month. We'll need to check a sedimentation rate at that time. The patient is having some hot flashes on the dexamethasone.

## 2013-09-25 NOTE — Telephone Encounter (Signed)
Patient received prednisone dose pack for headaches on 08-15-13 .Kara Faulks, MD

## 2013-09-25 NOTE — Telephone Encounter (Signed)
Patient calling to ask whether or not it is absolutely necessary for her to have the MRI because she is very claustrophobic. Please call and advise patient.

## 2013-09-25 NOTE — Telephone Encounter (Signed)
[employer] called/Sam. PA done over phone. Linzess 145 mcg approved until 07/23/14. [facility]/Andy made aware. [employer] will be sending a letter of the approval.

## 2013-09-25 NOTE — Telephone Encounter (Signed)
Patient is calling wanting to know if it is necessary for he to have the MRI done because she is very claustrophobic. Please advise

## 2013-09-26 ENCOUNTER — Telehealth: Payer: Self-pay | Admitting: Neurology

## 2013-09-26 MED ORDER — ZOLPIDEM TARTRATE 10 MG PO TABS: 10.0000 mg | ORAL_TABLET | Freq: Every evening | ORAL | Status: DC | PRN

## 2013-09-26 NOTE — Telephone Encounter (Signed)
I called patient. The patient is taking Decadron, and she is not sleeping well. I will cut her back to taking 12 mg at night, and she will go on Ambien if needed for sleep. The patient is on the 4 mg Decadron tablets.

## 2013-09-26 NOTE — Telephone Encounter (Signed)
Pt went up on steroids lastnight at 6:30. So she had two doses yesterday and so she has not slept and was told he would call her something in. States Ambien is what she has taken in the past. When she took the other two steroids lastnight at 6:30 it caused her stomach to be upset. States she has to have some to go get it for her and she needs it before tonight. The steroid is called Cexamphasone. Wants the Dr. To be told to make sure she does not run out since she increased the dosage lastnight.

## 2013-09-26 NOTE — Telephone Encounter (Signed)
Please advise, patient is calling for a sleep medication to be called in.

## 2013-09-26 NOTE — Telephone Encounter (Signed)
Patient called to state she would like to discuss her steroid medication with [name]. Patient states it is very important and that she needs a call back today.

## 2013-09-29 ENCOUNTER — Telehealth (INDEPENDENT_AMBULATORY_CARE_PROVIDER_SITE_OTHER): Payer: Self-pay | Admitting: General Surgery

## 2013-09-29 MED ORDER — METOPROLOL TARTRATE 25 MG PO TABS: 37.5000 mg | ORAL_TABLET | Freq: Three times a day (TID) | ORAL | Status: DC

## 2013-09-29 NOTE — Telephone Encounter (Signed)
Pt called to ask if she can have her PCP in [place] remove her sutures (temporal artery bx) [place] [facility] [place] [facility] [place].  She is sick with a back URI and just does not want to make the trip.  Explained that IF her PCP is willing to do this, it is certainly OK.  Her appt for tomorrow (nurse only) was then cancelled and she will call back prn.

## 2013-09-30 ENCOUNTER — Encounter (INDEPENDENT_AMBULATORY_CARE_PROVIDER_SITE_OTHER): Payer: Medicare Other

## 2013-10-01 ENCOUNTER — Telehealth: Payer: Self-pay | Admitting: Neurology

## 2013-10-01 MED ORDER — TRAZODONE HCL 100 MG PO TABS: 100.0000 mg | ORAL_TABLET | Freq: Every day | ORAL | Status: DC

## 2013-10-01 NOTE — Telephone Encounter (Signed)
Tammy [facility] called concerned about the medication the [name] prescribed for the patient which is trazodone. Tammy wanted to informed [name] that there was an interaction between Trazodone and Celexa that the patient was already taking,  may cause serotonin syndrome. [name] stated that it was ok to still dispense the medication. I informed Tammy from [facility], per [name].

## 2013-10-01 NOTE — Telephone Encounter (Signed)
Pt called states she is still not sleeping and that the prescription zolpidem (AMBIEN) 10 MG tablet is causing pt to have nightmares and does not want to continue with this prescription. Pt needs [name] or his nurse to call something else in for sleep, and pt is having a procedure on 10/02/13 on pt's right ear where they are going to freeze a spot and wants to know if this is ok for pt to go to this visit.

## 2013-10-01 NOTE — Telephone Encounter (Signed)
I called patient. The Ambien for sleep seem to cause her to have nightmares. The patient will be switched to trazodone. The patient is already on alprazolam 1 mg 3 times daily, which indicates that this does not help her sleep.

## 2013-10-02 ENCOUNTER — Observation Stay (HOSPITAL_COMMUNITY)
Admission: EM | Admit: 2013-10-02 | Discharge: 2013-10-06 | Disposition: A | Discharge status: Home / Self Care | Payer: Medicare Other | Attending: Family Medicine | Admitting: Family Medicine

## 2013-10-02 ENCOUNTER — Emergency Department (HOSPITAL_COMMUNITY): Payer: Medicare Other

## 2013-10-02 ENCOUNTER — Encounter (HOSPITAL_COMMUNITY): Payer: Self-pay | Admitting: Emergency Medicine

## 2013-10-02 DIAGNOSIS — Z87891 Personal history of nicotine dependence: Secondary | ICD-10-CM | POA: Diagnosis not present

## 2013-10-02 DIAGNOSIS — R531 Weakness: Secondary | ICD-10-CM

## 2013-10-02 DIAGNOSIS — R5383 Other fatigue: Secondary | ICD-10-CM | POA: Diagnosis not present

## 2013-10-02 DIAGNOSIS — R5381 Other malaise: Secondary | ICD-10-CM | POA: Diagnosis not present

## 2013-10-02 DIAGNOSIS — K219 Gastro-esophageal reflux disease without esophagitis: Secondary | ICD-10-CM | POA: Insufficient documentation

## 2013-10-02 DIAGNOSIS — R002 Palpitations: Secondary | ICD-10-CM | POA: Insufficient documentation

## 2013-10-02 DIAGNOSIS — I5032 Chronic diastolic (congestive) heart failure: Secondary | ICD-10-CM | POA: Diagnosis present

## 2013-10-02 DIAGNOSIS — I10 Essential (primary) hypertension: Secondary | ICD-10-CM | POA: Diagnosis present

## 2013-10-02 DIAGNOSIS — E039 Hypothyroidism, unspecified: Secondary | ICD-10-CM | POA: Diagnosis present

## 2013-10-02 DIAGNOSIS — M316 Other giant cell arteritis: Secondary | ICD-10-CM | POA: Diagnosis not present

## 2013-10-02 DIAGNOSIS — I509 Heart failure, unspecified: Secondary | ICD-10-CM | POA: Insufficient documentation

## 2013-10-02 DIAGNOSIS — I5022 Chronic systolic (congestive) heart failure: Secondary | ICD-10-CM | POA: Insufficient documentation

## 2013-10-02 DIAGNOSIS — D649 Anemia, unspecified: Secondary | ICD-10-CM | POA: Diagnosis not present

## 2013-10-02 DIAGNOSIS — Z9849 Cataract extraction status, unspecified eye: Secondary | ICD-10-CM | POA: Diagnosis not present

## 2013-10-02 DIAGNOSIS — IMO0002 Reserved for concepts with insufficient information to code with codable children: Secondary | ICD-10-CM | POA: Diagnosis not present

## 2013-10-02 DIAGNOSIS — Z961 Presence of intraocular lens: Secondary | ICD-10-CM | POA: Diagnosis not present

## 2013-10-02 DIAGNOSIS — E785 Hyperlipidemia, unspecified: Secondary | ICD-10-CM | POA: Diagnosis not present

## 2013-10-02 DIAGNOSIS — I2510 Atherosclerotic heart disease of native coronary artery without angina pectoris: Secondary | ICD-10-CM | POA: Diagnosis present

## 2013-10-02 DIAGNOSIS — Z9981 Dependence on supplemental oxygen: Secondary | ICD-10-CM | POA: Insufficient documentation

## 2013-10-02 DIAGNOSIS — I519 Heart disease, unspecified: Secondary | ICD-10-CM | POA: Diagnosis present

## 2013-10-02 DIAGNOSIS — R4182 Altered mental status, unspecified: Secondary | ICD-10-CM | POA: Insufficient documentation

## 2013-10-02 DIAGNOSIS — R51 Headache: Secondary | ICD-10-CM

## 2013-10-02 DIAGNOSIS — J449 Chronic obstructive pulmonary disease, unspecified: Secondary | ICD-10-CM | POA: Diagnosis present

## 2013-10-02 DIAGNOSIS — K589 Irritable bowel syndrome without diarrhea: Secondary | ICD-10-CM | POA: Insufficient documentation

## 2013-10-02 DIAGNOSIS — I4892 Unspecified atrial flutter: Secondary | ICD-10-CM | POA: Diagnosis present

## 2013-10-02 DIAGNOSIS — E119 Type 2 diabetes mellitus without complications: Secondary | ICD-10-CM | POA: Diagnosis not present

## 2013-10-02 DIAGNOSIS — R079 Chest pain, unspecified: Secondary | ICD-10-CM | POA: Diagnosis present

## 2013-10-02 DIAGNOSIS — R519 Headache, unspecified: Secondary | ICD-10-CM | POA: Diagnosis present

## 2013-10-02 DIAGNOSIS — J4489 Other specified chronic obstructive pulmonary disease: Secondary | ICD-10-CM | POA: Insufficient documentation

## 2013-10-02 LAB — BASIC METABOLIC PANEL
BUN: 38 mg/dL — ABNORMAL HIGH (ref 6–23)
CHLORIDE: 100 meq/L (ref 96–112)
CO2: 27 mEq/L (ref 19–32)
CREATININE: 1.34 mg/dL — AB (ref 0.50–1.10)
Calcium: 9.3 mg/dL (ref 8.4–10.5)
GFR calc Af Amer: 46 mL/min — ABNORMAL LOW (ref >90)
GFR calc non Af Amer: 40 mL/min — ABNORMAL LOW (ref >90)
GLUCOSE: 127 mg/dL — AB (ref 70–99)
POTASSIUM: 4.4 meq/L (ref 3.7–5.3)
Sodium: 138 mEq/L (ref 137–147)

## 2013-10-02 LAB — CBC WITH DIFFERENTIAL/PLATELET
BASOS PCT: 0 % (ref 0–1)
Basophils Absolute: 0 10*3/uL (ref 0.0–0.1)
EOS ABS: 0.1 10*3/uL (ref 0.0–0.7)
Eosinophils Relative: 1 % (ref 0–5)
HEMATOCRIT: 32.7 % — AB (ref 36.0–46.0)
HEMOGLOBIN: 10.1 g/dL — AB (ref 12.0–15.0)
LYMPHS PCT: 26 % (ref 12–46)
Lymphs Abs: 1.7 10*3/uL (ref 0.7–4.0)
MCH: 28.8 pg (ref 26.0–34.0)
MCHC: 30.9 g/dL (ref 30.0–36.0)
MCV: 93.2 fL (ref 78.0–100.0)
MONOS PCT: 4 % (ref 3–12)
Monocytes Absolute: 0.3 10*3/uL (ref 0.1–1.0)
Neutro Abs: 4.4 10*3/uL (ref 1.7–7.7)
Neutrophils Relative %: 69 % (ref 43–77)
Platelets: 314 10*3/uL (ref 150–400)
RBC: 3.51 MIL/uL — ABNORMAL LOW (ref 3.87–5.11)
RDW: 14.4 % (ref 11.5–15.5)
WBC: 6.5 10*3/uL (ref 4.0–10.5)

## 2013-10-02 LAB — URINALYSIS, ROUTINE W REFLEX MICROSCOPIC
Bilirubin Urine: NEGATIVE
GLUCOSE, UA: NEGATIVE mg/dL
Hgb urine dipstick: NEGATIVE
Ketones, ur: NEGATIVE mg/dL
LEUKOCYTES UA: NEGATIVE
NITRITE: NEGATIVE
PH: 5.5 (ref 5.0–8.0)
Protein, ur: NEGATIVE mg/dL
SPECIFIC GRAVITY, URINE: 1.02 (ref 1.005–1.030)
Urobilinogen, UA: 0.2 mg/dL (ref 0.0–1.0)

## 2013-10-02 LAB — TROPONIN I

## 2013-10-02 LAB — GLUCOSE, CAPILLARY: Glucose-Capillary: 133 mg/dL — ABNORMAL HIGH (ref 70–99)

## 2013-10-02 LAB — PROTIME-INR
INR: 1 (ref 0.00–1.49)
Prothrombin Time: 13 seconds (ref 11.6–15.2)

## 2013-10-02 LAB — PRO B NATRIURETIC PEPTIDE: Pro B Natriuretic peptide (BNP): 4681 pg/mL — ABNORMAL HIGH (ref 0–125)

## 2013-10-02 MED ORDER — MORPHINE SULFATE ER 15 MG PO TBCR
30.0000 mg | EXTENDED_RELEASE_TABLET | Freq: Two times a day (BID) | ORAL | Status: DC
  Administered 2013-10-02 – 2013-10-06 (×8): 30 mg via ORAL
  Filled 2013-10-02 (×8): qty 2

## 2013-10-02 MED ORDER — CLOTRIMAZOLE 1 % EX CREA
TOPICAL_CREAM | Freq: Two times a day (BID) | CUTANEOUS | Status: DC
  Administered 2013-10-02 – 2013-10-06 (×8): via TOPICAL
  Filled 2013-10-02: qty 15

## 2013-10-02 MED ORDER — ONDANSETRON HCL 4 MG PO TABS: 4.0000 mg | ORAL_TABLET | Freq: Four times a day (QID) | ORAL | Status: DC | PRN

## 2013-10-02 MED ORDER — ALBUTEROL SULFATE (2.5 MG/3ML) 0.083% IN NEBU: 2.5000 mg | INHALATION_SOLUTION | Freq: Four times a day (QID) | RESPIRATORY_TRACT | Status: DC | PRN

## 2013-10-02 MED ORDER — ONDANSETRON HCL 4 MG/2ML IJ SOLN: 4.0000 mg | Freq: Four times a day (QID) | INTRAMUSCULAR | Status: DC | PRN

## 2013-10-02 MED ORDER — POTASSIUM CHLORIDE CRYS ER 20 MEQ PO TBCR
20.0000 meq | EXTENDED_RELEASE_TABLET | Freq: Every day | ORAL | Status: DC
  Administered 2013-10-03 – 2013-10-06 (×4): 20 meq via ORAL
  Filled 2013-10-02 (×2): qty 1
  Filled 2013-10-02: qty 2
  Filled 2013-10-02: qty 1

## 2013-10-02 MED ORDER — ACETAMINOPHEN 325 MG PO TABS
650.0000 mg | ORAL_TABLET | Freq: Four times a day (QID) | ORAL | Status: DC | PRN
  Administered 2013-10-05 (×2): 650 mg via ORAL
  Filled 2013-10-02 (×2): qty 2

## 2013-10-02 MED ORDER — VITAMIN B-12 1000 MCG PO TABS
1000.0000 ug | ORAL_TABLET | Freq: Every day | ORAL | Status: DC
  Administered 2013-10-03 – 2013-10-06 (×4): 1000 ug via ORAL
  Filled 2013-10-02 (×4): qty 1

## 2013-10-02 MED ORDER — ENOXAPARIN SODIUM 40 MG/0.4ML ~~LOC~~ SOLN
40.0000 mg | SUBCUTANEOUS | Status: DC
  Administered 2013-10-02 – 2013-10-05 (×4): 40 mg via SUBCUTANEOUS
  Filled 2013-10-02 (×4): qty 0.4

## 2013-10-02 MED ORDER — INSULIN ASPART 100 UNIT/ML ~~LOC~~ SOLN
0.0000 [IU] | Freq: Three times a day (TID) | SUBCUTANEOUS | Status: DC
  Administered 2013-10-03 – 2013-10-04 (×3): 1 [IU] via SUBCUTANEOUS
  Administered 2013-10-05 – 2013-10-06 (×2): 2 [IU] via SUBCUTANEOUS

## 2013-10-02 MED ORDER — CLOTRIMAZOLE 1 % EX CREA
TOPICAL_CREAM | CUTANEOUS | Status: AC
  Filled 2013-10-02: qty 15

## 2013-10-02 MED ORDER — POLYETHYLENE GLYCOL 3350 17 G PO PACK
17.0000 g | PACK | Freq: Every day | ORAL | Status: DC | PRN
  Administered 2013-10-03 – 2013-10-05 (×3): 17 g via ORAL
  Filled 2013-10-02 (×3): qty 1

## 2013-10-02 MED ORDER — LEVOTHYROXINE SODIUM 100 MCG PO TABS
100.0000 ug | ORAL_TABLET | Freq: Every day | ORAL | Status: DC
  Administered 2013-10-03 – 2013-10-06 (×4): 100 ug via ORAL
  Filled 2013-10-02 (×4): qty 1

## 2013-10-02 MED ORDER — LINACLOTIDE 145 MCG PO CAPS
145.0000 ug | ORAL_CAPSULE | Freq: Every day | ORAL | Status: DC
  Administered 2013-10-03 – 2013-10-06 (×4): 145 ug via ORAL
  Filled 2013-10-02 (×6): qty 1

## 2013-10-02 MED ORDER — ALPRAZOLAM 1 MG PO TABS
1.0000 mg | ORAL_TABLET | Freq: Three times a day (TID) | ORAL | Status: DC | PRN
  Administered 2013-10-03 – 2013-10-06 (×10): 1 mg via ORAL
  Filled 2013-10-02 (×10): qty 1

## 2013-10-02 MED ORDER — LORAZEPAM 0.5 MG PO TABS
0.5000 mg | ORAL_TABLET | Freq: Every day | ORAL | Status: DC
  Administered 2013-10-02 – 2013-10-05 (×4): 0.5 mg via ORAL
  Filled 2013-10-02 (×4): qty 1

## 2013-10-02 MED ORDER — LISINOPRIL 5 MG PO TABS
2.5000 mg | ORAL_TABLET | Freq: Every day | ORAL | Status: DC
  Administered 2013-10-03 – 2013-10-06 (×4): 2.5 mg via ORAL
  Filled 2013-10-02 (×5): qty 1

## 2013-10-02 MED ORDER — FENOFIBRATE 160 MG PO TABS
160.0000 mg | ORAL_TABLET | Freq: Every day | ORAL | Status: DC
  Administered 2013-10-03 – 2013-10-05 (×3): 160 mg via ORAL
  Filled 2013-10-02 (×6): qty 1

## 2013-10-02 MED ORDER — IOHEXOL 300 MG/ML  SOLN: 75.0000 mL | Freq: Once | INTRAMUSCULAR | Status: DC | PRN

## 2013-10-02 MED ORDER — SODIUM CHLORIDE 0.9 % IV SOLN
INTRAVENOUS | Status: AC
  Administered 2013-10-02: 16:00:00 via INTRAVENOUS

## 2013-10-02 MED ORDER — ASPIRIN 325 MG PO TABS
325.0000 mg | ORAL_TABLET | Freq: Every day | ORAL | Status: DC
  Administered 2013-10-03: 325 mg via ORAL
  Filled 2013-10-02: qty 1

## 2013-10-02 MED ORDER — PANTOPRAZOLE SODIUM 40 MG PO TBEC
40.0000 mg | DELAYED_RELEASE_TABLET | Freq: Two times a day (BID) | ORAL | Status: DC
  Administered 2013-10-02 – 2013-10-06 (×8): 40 mg via ORAL
  Filled 2013-10-02 (×8): qty 1

## 2013-10-02 MED ORDER — FENOFIBRATE 160 MG PO TABS
ORAL_TABLET | ORAL | Status: AC
  Filled 2013-10-02: qty 1

## 2013-10-02 MED ORDER — ACETAMINOPHEN 650 MG RE SUPP: 650.0000 mg | Freq: Four times a day (QID) | RECTAL | Status: DC | PRN

## 2013-10-02 MED ORDER — ACETAMINOPHEN 325 MG PO TABS
650.0000 mg | ORAL_TABLET | Freq: Four times a day (QID) | ORAL | Status: DC | PRN
  Administered 2013-10-03 – 2013-10-04 (×2): 650 mg via ORAL
  Filled 2013-10-02 (×2): qty 2

## 2013-10-02 MED ORDER — FUROSEMIDE 80 MG PO TABS
80.0000 mg | ORAL_TABLET | Freq: Every day | ORAL | Status: DC
  Administered 2013-10-04 – 2013-10-06 (×3): 80 mg via ORAL
  Filled 2013-10-02 (×4): qty 1

## 2013-10-02 MED ORDER — NITROGLYCERIN 0.4 MG SL SUBL: 0.4000 mg | SUBLINGUAL_TABLET | SUBLINGUAL | Status: DC | PRN

## 2013-10-02 MED ORDER — DEXAMETHASONE 4 MG PO TABS
12.0000 mg | ORAL_TABLET | Freq: Every day | ORAL | Status: DC
  Administered 2013-10-03 – 2013-10-06 (×4): 12 mg via ORAL
  Filled 2013-10-02 (×6): qty 3

## 2013-10-02 MED ORDER — METOPROLOL TARTRATE 25 MG PO TABS
37.5000 mg | ORAL_TABLET | Freq: Three times a day (TID) | ORAL | Status: DC
  Administered 2013-10-02: 37.5 mg via ORAL
  Administered 2013-10-03: 12.5 mg via ORAL
  Filled 2013-10-02 (×2): qty 2

## 2013-10-02 MED ORDER — CITALOPRAM HYDROBROMIDE 20 MG PO TABS
20.0000 mg | ORAL_TABLET | Freq: Two times a day (BID) | ORAL | Status: DC
  Administered 2013-10-02 – 2013-10-06 (×8): 20 mg via ORAL
  Filled 2013-10-02 (×8): qty 1

## 2013-10-02 NOTE — ED Notes (Signed)
Dr. Manus Gunningancour spoke with pt and gave permission for pt to take her daily Morphine 30mg  tablet and Metoprolol 12.5mg . Pt took medications at 17:35.

## 2013-10-02 NOTE — ED Provider Notes (Signed)
CSN: [id number]     Arrival date & time 10/02/13  1235 History  This chart was scribed for Glynn OctaveStephen Dimitri Shakespeare, MD by Bennett Scrapehristina Taylor, ED Scribe. This patient was seen in room [facility]07[facility] and the patient's care was started at 1:42 PM.   Chief Complaint  Patient presents with  . Chest Pain  . Weakness    The history is provided by the patient. No language interpreter was used.    HPI Comments: Kara [name] is a 68 y.o. female with a h/o CHF and A. Fib  who presents to the Emergency Department complaining of CP underneath her left breast. The pain has been intermittent since it's onset yesterday. Episodes last for 15 to 20 minutes before resolving on their own. She denies radiation and admits that she has felt this pain before. She is unclear when her last episode of the pain prior to yesterday was. She reports an associated NP cough and weakness affecting her ability to ambulate for the past week. She reports a decreased appetite but denies any emesis or diarrhea.  She denies using a cane or walker at baseline. She denies any h/o cardiac stents or MI. She is on 3L O2 at home for COPD. She denies any fevers, HA, dizziness or lightheadedness. She was recently started on Decadron 12 mg every morning but states that it causes her to "burn up inside". She also reports difficulty swallowing medications for the past 3 days but feels that this is due to fearing the side effects of the Decadron. She has a recent admission for temporal artery biopsy that was done due to her loosing vision in her left eye last month.    Dr. Lesia SagoKeith Willis [place]   Past Medical History  Diagnosis Date  . Type 2 diabetes mellitus   . Arthritis   . COPD (chronic obstructive pulmonary disease)     Home oxygen  . Palpitations     Reports history of atrial fibrillation, although none seen recently  . Coronary atherosclerosis of native coronary artery     Nonobstructive at catheterization 2008  . GERD  (gastroesophageal reflux disease)   . Irritable bowel syndrome   . Chronic anemia   . Hypothyroidism   . Hyperlipidemia   . Chronic edema   . Pneumonia   . Diabetes mellitus   . Hypertension   . Chronic pain   . Anemia 08/04/2011  . Anemia of chronic disease 08/04/2011  . Bulging lumbar disc     3  . Headache(784.0) 07/30/2013  . Sprain of neck 07/30/2013  . Asthma   . 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 Renal insufficiency 08/07/2011  . Claustrophobia    Past Surgical History  Procedure Laterality Date  . Cholecystectomy    . Appendectomy    . Foot surgery    . Abdominal hysterectomy    . Cataract extraction w/phaco Left 09/01/2013    Procedure: CATARACT EXTRACTION PHACO AND INTRAOCULAR LENS PLACEMENT (IOC);  Surgeon: Gemma PayorKerry Hunt, MD;  Location: [facility];  Service: Ophthalmology;  Laterality: Left;  CDE:  14.13  . Artery biopsy Left 09/22/2013    Procedure: BIOPSY TEMPORAL ARTERY;  Surgeon: Mariella SaaBenjamin T Hoxworth, MD;  Location: [facility] OR;  Service: General;  Laterality: Left;   Family History  Problem Relation Age of Onset  . Hypertension    . Diabetes Brother   . Migraines Brother    History  Substance Use Topics  . Smoking status: Former Smoker -- 1.00 packs/day  for 48 years    Types: Cigarettes    Quit date: 12/28/1995  . Smokeless tobacco: Never Used  . Alcohol Use: No   No OB history provided.  Review of Systems  A complete 10 system review of systems was obtained and all systems are negative except as noted in the HPI and PMH.    Allergies  Prednisone and Codeine  Home Medications   Current Outpatient Rx  Name  Route  Sig  Dispense  Refill  . acetaminophen (TYLENOL) 325 MG tablet   Oral   Take 650 mg by mouth every 6 (six) hours as needed for mild pain.          [name] ALPRAZolam (XANAX) 1 MG tablet   Oral   Take 1 mg by mouth 3 (three) times daily as needed for anxiety.          . citalopram (CELEXA) 20 MG tablet   Oral   Take 20 mg by mouth 2 (two) times  daily.          . clotrimazole-betamethasone (LOTRISONE) cream   Topical   Apply 1 application topically 2 (two) times daily. Apply to ear         . dexamethasone (DECADRON) 4 MG tablet   Oral   Take 12 mg by mouth daily.          . fenofibrate 160 MG tablet   Oral   Take 160 mg by mouth at bedtime.         . furosemide (LASIX) 80 MG tablet   Oral   Take 80 mg by mouth daily.          [name] levothyroxine (SYNTHROID, LEVOTHROID) 100 MCG tablet   Oral   Take 100 mcg by mouth daily.           . Linaclotide (LINZESS) 145 MCG CAPS capsule   Oral   Take 145 mcg by mouth daily.         [name] lisinopril (PRINIVIL,ZESTRIL) 2.5 MG tablet   Oral   Take 1 tablet (2.5 mg total) by mouth daily.   30 tablet   5   . metFORMIN (GLUCOPHAGE) 1000 MG tablet   Oral   Take 1,000 mg by mouth 2 (two) times daily with a meal.          . metoprolol tartrate (LOPRESSOR) 25 MG tablet   Oral   Take 1.5 tablets (37.5 mg total) by mouth 3 (three) times daily.   135 tablet   2   . morphine (MS CONTIN) 30 MG 12 hr tablet   Oral   Take 30 mg by mouth 2 (two) times daily. To treat pain associated with 3 bulging disc in back         . pantoprazole (PROTONIX) 40 MG tablet   Oral   Take 40 mg by mouth 2 (two) times daily.         . Potassium Chloride Crys CR (KLOR-CON M20 PO)   Oral   Take 20 mEq by mouth daily.          . vitamin B-12 (CYANOCOBALAMIN) 1000 MCG tablet   Oral   Take 1,000 mcg by mouth daily.         . nitroGLYCERIN (NITROSTAT) 0.4 MG SL tablet   Sublingual   Place 0.4 mg under the tongue every 5 (five) minutes as needed for chest pain.          . polyethylene glycol (MIRALAX / GLYCOLAX)  packet   Oral   Take 17 g by mouth daily as needed for mild constipation.           Triage Vitals: BP 113/30  Pulse 62  Temp(Src) 97.9 F (36.6 C) (Oral)  Resp 20  SpO2 100%  Physical Exam  Nursing note and vitals reviewed. Constitutional: She is oriented to  person, place, and time. She appears well-developed and well-nourished. No distress.  HENT:  Head: Normocephalic and atraumatic.  Surgical incision to left temple, no erythema or drainage. Dry MM  Eyes: EOM are normal.  Left eye ptosis   Neck: Neck supple. No tracheal deviation present.  No meningismus   Cardiovascular: Normal rate and regular rhythm.   Pulmonary/Chest: Effort normal and breath sounds normal. No respiratory distress. She exhibits no tenderness (CP is not reproducible on exam).  Abdominal: Soft. There is no tenderness.  Musculoskeletal: Normal range of motion.  Intact peripheral pulses, no peripheral edema  Neurological: She is alert and oriented to person, place, and time.  Generally poor effort   Skin: Skin is warm and dry.  Psychiatric: She has a normal mood and affect. Her behavior is normal.    ED Course  Procedures (including critical care time)  DIAGNOSTIC STUDIES: Oxygen Saturation is 100% on RA, 3L O2 by my interpretation.    COORDINATION OF CARE: 1:53 PM-Discussed treatment plan which includes CT of head, CXR, CBC panel, BMP and UA with pt at bedside and pt agreed to plan.   Labs Review Labs Reviewed  CBC WITH DIFFERENTIAL - Abnormal; Notable for the following:    RBC 3.51 (*)    Hemoglobin 10.1 (*)    HCT 32.7 (*)    All other components within normal limits  BASIC METABOLIC PANEL - Abnormal; Notable for the following:    Glucose, Bld 127 (*)    BUN 38 (*)    Creatinine, Ser 1.34 (*)    GFR calc non Af Amer 40 (*)    GFR calc Af Amer 46 (*)    All other components within normal limits  PRO B NATRIURETIC PEPTIDE - Abnormal; Notable for the following:    Pro B Natriuretic peptide (BNP) 4681.0 (*)    All other components within normal limits  TROPONIN I  PROTIME-INR  URINALYSIS, ROUTINE W REFLEX MICROSCOPIC   Imaging Review Ct Head Wo Contrast  10/02/2013   CLINICAL DATA:  Altered mental status  EXAM: CT HEAD WITHOUT CONTRAST  TECHNIQUE:  Contiguous axial images were obtained from the base of the skull through the vertex without intravenous contrast. Study was obtained within 24 hr of patient's arrival at the emergency department.  COMPARISON:  August 18, 2013  FINDINGS: There is age related volume loss, stable. There is no demonstrable mass, hemorrhage, extra-axial fluid collection, or midline shift. There is minimal small vessel disease in the centra semiovale bilaterally. Elsewhere, gray-white compartments appear normal. There is no demonstrable acute infarct. There is a focal defect in the superior posterior left frontal bone consistent with a burr hole. Bony calvarium otherwise appears intact. The mastoid air cells are clear. Orbits appear symmetric and normal bilaterally.  IMPRESSION: Minimal periventricular small vessel disease. No intracranial mass, hemorrhage, or acute infarct. Postoperative defect left frontal bone.   Electronically Signed   By: Bretta Bang M.D.   On: 10/02/2013 15:02   Dg Chest Portable 1 View  10/02/2013   CLINICAL DATA:  Chest pain  EXAM: PORTABLE CHEST - 1 VIEW  COMPARISON:  September 22, 2013  FINDINGS: There is a degree of underlying emphysematous change. There is no edema or consolidation. Heart is mildly enlarged with normal pulmonary vascularity. No adenopathy. No bone lesions.  IMPRESSION: Mild cardiac enlargement. Underlying emphysematous change. No edema or consolidation.   Electronically Signed   By: Bretta Bang M.D.   On: 10/02/2013 13:07     EKG Interpretation   Date/Time:  Thursday October 02 2013 12:50:58 EDT Ventricular Rate:  60 PR Interval:  162 QRS Duration: 88 QT Interval:  428 QTC Calculation: 428 R Axis:   69 Text Interpretation:  Normal sinus rhythm Minimal voltage criteria for  LVH, may be normal variant ST \\T \ T wave abnormality, consider anterior  ischemia Abnormal ECG When compared with ECG of 20-Aug-2013 04:05, ST no  longer depressed in Lateral leads T wave inversion  no longer evident in  Inferior leads T wave inversion no longer evident in Lateral leads lateral  T waves now upright Confirmed by Manus Gunning  MD, Kennidee Heyne [phone]) on 10/02/2013  1:55:14 PM      MDM   Final diagnoses:  Chest pain  Weakness  Temporal arteritis   intermittent left-sided chest pain since last night that lasts 15-20 minutes at a time. Associated with shortness of breath. Patient is on home oxygen for COPD. Notably she had a temporal artery biopsy 10 days ago and is on prednisone for temporal arteritis.  EKG shows T wave anteriorly inversions, unchanged.  T waves now upright laterally.  Troponin negative. Shortness of breath is at baseline. Pain is somewhat atypical for ACS the patient does have risk factors. Clean catheterization in 2008.  Her generalized weakness and insomnia likely related to recent steroid use. She does not appear to have a CHF or COPD exacerbation at this time. Her heart score is 5. Will observe for chest pain rule out.     I personally performed the services described in this documentation, which was scribed in my presence. The recorded information has been reviewed and is accurate.      Glynn Octave, MD 10/02/13 [phone]

## 2013-10-02 NOTE — H&P (Signed)
[facility] History and Physical  Kara ShirtsCarolyn W Portnoy WGN:[id number] DOB: 10/25/1945 DOA: 10/02/2013  Referring physician:  PCP: Alice ReichertMCINNIS,ANGUS G, MD  Specialists:   Chief Complaint: chest pain   HPI: Kara [name] is a 68 y.o. 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    Past Medical History  Diagnosis Date  . Type 2 diabetes mellitus   . Arthritis   . COPD (chronic obstructive pulmonary disease)     Home oxygen  . Palpitations     Reports history of atrial fibrillation, although none seen recently  . Coronary atherosclerosis of native coronary artery     Nonobstructive at catheterization 2008  . GERD (gastroesophageal reflux disease)   . Irritable bowel syndrome   . Chronic anemia   . Hypothyroidism   . Hyperlipidemia   . Chronic edema   . Pneumonia   . Diabetes mellitus   . Hypertension   . Chronic pain   . Anemia 08/04/2011  . Anemia of chronic disease 08/04/2011  . Bulging lumbar disc     3  . Headache(784.0) 07/30/2013  . Sprain of neck 07/30/2013  . Asthma   . Visual loss, left eye  09/12/2013  . Renal insufficiency 08/07/2011  . Claustrophobia    Past Surgical History  Procedure Laterality Date  . Cholecystectomy    . Appendectomy    . Foot surgery    . Abdominal hysterectomy    . Cataract extraction w/phaco Left 09/01/2013    Procedure: CATARACT EXTRACTION PHACO AND INTRAOCULAR LENS PLACEMENT (IOC);  Surgeon: Gemma PayorKerry Hunt, MD;  Location: [facility];  Service: Ophthalmology;  Laterality: Left;  CDE:  14.13  . Artery biopsy Left 09/22/2013    Procedure: BIOPSY TEMPORAL ARTERY;  Surgeon: Mariella SaaBenjamin T Hoxworth, MD;  Location: [facility];  Service: General;  Laterality: Left;   Social History:  reports that she quit smoking about 17 years ago. Her smoking use included Cigarettes. She has a 48 pack-year smoking history. She has never used smokeless tobacco. She reports that she does not drink alcohol or use illicit drugs. Home;  where does patient live--home, ALF, SNF? and with whom if at home? yes Can patient participate in ADLs?  Allergies  Allergen Reactions  . Prednisone     Can have in small doses  . Codeine Nausea And Vomiting and Rash    Family History  Problem Relation Age of Onset  . Hypertension    . Diabetes Brother   . Migraines Brother     (be sure to complete)  Prior to Admission medications   Medication Sig Start Date End Date Taking? Authorizing Provider  acetaminophen (TYLENOL) 325 MG tablet Take 650 mg by mouth every 6 (six) hours as needed for mild pain.    Yes Historical Provider,  MD  ALPRAZolam (XANAX) 1 MG tablet Take 1 mg by mouth 3 (three) times daily as needed for anxiety.    Yes Historical Provider, MD  citalopram (CELEXA) 20 MG tablet Take 20 mg by mouth 2 (two) times daily.    Yes Historical Provider, MD  clotrimazole-betamethasone (LOTRISONE) cream Apply 1 application topically 2 (two) times daily. Apply to ear   Yes Historical Provider, MD  dexamethasone (DECADRON) 4 MG tablet Take 12 mg by mouth daily.    Yes Historical Provider, MD  fenofibrate  160 MG tablet Take 160 mg by mouth at bedtime.   Yes Historical Provider, MD  furosemide (LASIX) 80 MG tablet Take 80 mg by mouth daily.    Yes Historical Provider, MD  levothyroxine (SYNTHROID, LEVOTHROID) 100 MCG tablet Take 100 mcg by mouth daily.     Yes Historical Provider, MD  Linaclotide Karlene Einstein) 145 MCG CAPS capsule Take 145 mcg by mouth daily.   Yes Historical Provider, MD  lisinopril (PRINIVIL,ZESTRIL) 2.5 MG tablet Take 1 tablet (2.5 mg total) by mouth daily. 08/23/13  Yes Angus Edilia Bo, MD  metFORMIN (GLUCOPHAGE) 1000 MG tablet Take 1,000 mg by mouth 2 (two) times daily with a meal.    Yes Historical Provider, MD  metoprolol tartrate (LOPRESSOR) 25 MG tablet Take 1.5 tablets (37.5 mg total) by mouth 3 (three) times daily. 09/29/13  Yes Antoine Poche, MD  morphine (MS CONTIN) 30 MG 12 hr tablet Take 30 mg by mouth 2 (two) times daily. To treat pain associated with 3 bulging disc in back   Yes Historical Provider, MD  pantoprazole (PROTONIX) 40 MG tablet Take 40 mg by mouth 2 (two) times daily.   Yes Historical Provider, MD  Potassium Chloride Crys CR (KLOR-CON M20 PO) Take 20 mEq by mouth daily.    Yes Historical Provider, MD  vitamin B-12 (CYANOCOBALAMIN) 1000 MCG tablet Take 1,000 mcg by mouth daily.   Yes Historical Provider, MD  nitroGLYCERIN (NITROSTAT) 0.4 MG SL tablet Place 0.4 mg under the tongue every 5 (five) minutes as needed for chest pain.     Historical Provider, MD  polyethylene glycol (MIRALAX / GLYCOLAX) packet Take 17 g by mouth daily as needed for mild constipation.     Historical Provider, MD   Physical Exam: Filed Vitals:   10/02/13 1625  BP: 121/68  Pulse: 78  Temp:   Resp: 20     General:  alert  Eyes: eom-i  ENT: no oral ulcers   Neck: supple, no JVD  Cardiovascular: s1,s2 rrr  Respiratory: CTA BL  Abdomen: soft, nt,nd   Skin: no rash  Musculoskeletal: no LE edema  Psychiatric: no hallucinations   Neurologic: CN 2-12 intact, motor  5/5  Labs on Admission:  Basic Metabolic Panel:  Recent Labs Lab 10/02/13 1257  NA 138  K 4.4  CL 100  CO2 27  GLUCOSE 127*  BUN 38*  CREATININE 1.34*  CALCIUM 9.3   Liver Function Tests: No results found for this basename: AST, ALT, ALKPHOS, BILITOT, PROT, ALBUMIN,  in the last 168 hours No results found for this basename: LIPASE, AMYLASE,  in the last 168 hours No results found for this basename: AMMONIA,  in the last 168 hours CBC:  Recent Labs Lab 10/02/13 1257  WBC 6.5  NEUTROABS 4.4  HGB 10.1*  HCT 32.7*  MCV 93.2  PLT 314   Cardiac Enzymes:  Recent Labs Lab 10/02/13 1257  TROPONINI <0.30    BNP (last 3 results)  Recent Labs  10/02/13 1257  PROBNP 4681.0*   CBG: No results found for this basename: GLUCAP,  in the last 168 hours  Radiological Exams on Admission: Ct Head Wo Contrast  10/02/2013   CLINICAL DATA:  Altered mental status  EXAM: CT HEAD WITHOUT CONTRAST  TECHNIQUE: Contiguous axial images were obtained from the base of the skull through the vertex without intravenous contrast. Study was obtained within 24 hr of patient's arrival at the emergency department.  COMPARISON:  August 18, 2013  FINDINGS: There is age related volume loss, stable. There is no demonstrable mass, hemorrhage, extra-axial fluid collection, or midline shift. There is minimal small vessel disease in the centra semiovale bilaterally. Elsewhere, gray-white compartments appear normal. There is no demonstrable acute infarct. There is a focal defect in the superior posterior left frontal bone consistent with a burr hole. Bony calvarium otherwise appears intact. The mastoid air cells are clear. Orbits appear symmetric and normal bilaterally.  IMPRESSION: Minimal periventricular small vessel disease. No intracranial mass, hemorrhage, or acute infarct. Postoperative defect left frontal bone.   Electronically Signed   By: Bretta Bang M.D.   On: 10/02/2013 15:02   Dg Chest  Portable 1 View  10/02/2013   CLINICAL DATA:  Chest pain  EXAM: PORTABLE CHEST - 1 VIEW  COMPARISON:  September 22, 2013  FINDINGS: There is a degree of underlying emphysematous change. There is no edema or consolidation. Heart is mildly enlarged with normal pulmonary vascularity. No adenopathy. No bone lesions.  IMPRESSION: Mild cardiac enlargement. Underlying emphysematous change. No edema or consolidation.   Electronically Signed   By: Bretta Bang M.D.   On: 10/02/2013 13:07    EKG: Independently reviewed. NSR, t wave inversions chronic   Assessment/Plan Principal Problem:   Chest pain Active Problems:   COPD (chronic obstructive pulmonary disease)   Essential hypertension, benign   Coronary atherosclerosis of native coronary artery   Hypothyroidism   Chronic diastolic heart failure   Headache(784.0)   Atrial flutter   Systolic dysfunction  68 y.o. female with PMH of HTN, HPL, DM, CHF, COPD on home Oxygen, CAD, PAF not on AC presented with chest pain;  1. Chest pain , resolved; initial trop negative; hemodynamically stable;  -monitor on tele, serial troponin's, ECG; start ASA, cont BB; h/o CAD consult cardiology   2. COPD; no s/s of acute exacerbations; CXR: Mild cardiac enlargement. Underlying emphysematous change. No edema or consolidation.  -cont bronchodilators, oxygen  3. Recent Dx: temporal arteritis on prednisone   4. CHF chronic; no s/s of acute exacerbation; echo(08/21/13): LVEF 40%: diffuse hypokinesia  -cont home meds, monitor   5. DM on metformin, no recent HA1C;  -hold metformin; may not be a good candidate for metformin due to CKD;  check a1c;   6. PAF no on AC; currently on NSR; per last cardiology note patient is not on AC;  -cont BB; defer to cardiology   7. Chronic pain on opioids; cont , ovoid over sedation   Cardiology;  if consultant consulted, please document name and whether formally or informally consulted  Code Status: full (must indicate code  status--if unknown or must be presumed, indicate so) Family Communication: d/w patient  (indicate person spoken with, if applicable, with phone number if by telephone) Disposition Plan: home 24-48 home  (indicate anticipated LOS)  Time spent: >35 minuets   Esperanza Sheets [facility] Pager [phone]  If 7PM-7AM, please contact night-coverage [email or web address] Password [facility] 10/02/2013, 5:18 PM

## 2013-10-02 NOTE — ED Notes (Signed)
Patient ambulated in hall.  Patient was able to walk without assistance.  Also, due to fluid challenge - patient was given a sprite.

## 2013-10-02 NOTE — ED Notes (Signed)
Pt c/o gen weakness x 1 week. C/o nagging pain under left breast intermittent since yesterday. C/o some sob. Pt always on 02 3L at home and cont in ED. Pt alert/oriented. No resp distress noted. Nondiaphoretic.

## 2013-10-03 ENCOUNTER — Encounter (HOSPITAL_COMMUNITY): Payer: Self-pay | Admitting: *Deleted

## 2013-10-03 ENCOUNTER — Telehealth: Payer: Self-pay | Admitting: Neurology

## 2013-10-03 DIAGNOSIS — R079 Chest pain, unspecified: Secondary | ICD-10-CM | POA: Diagnosis not present

## 2013-10-03 LAB — BASIC METABOLIC PANEL
BUN: 33 mg/dL — ABNORMAL HIGH (ref 6–23)
CALCIUM: 9.3 mg/dL (ref 8.4–10.5)
CO2: 26 mEq/L (ref 19–32)
CREATININE: 1.17 mg/dL — AB (ref 0.50–1.10)
Chloride: 100 mEq/L (ref 96–112)
GFR calc Af Amer: 55 mL/min — ABNORMAL LOW (ref >90)
GFR calc non Af Amer: 47 mL/min — ABNORMAL LOW (ref >90)
Glucose, Bld: 120 mg/dL — ABNORMAL HIGH (ref 70–99)
Potassium: 4.5 mEq/L (ref 3.7–5.3)
SODIUM: 137 meq/L (ref 137–147)

## 2013-10-03 LAB — CBC
HCT: 32.3 % — ABNORMAL LOW (ref 36.0–46.0)
Hemoglobin: 10.3 g/dL — ABNORMAL LOW (ref 12.0–15.0)
MCH: 29.3 pg (ref 26.0–34.0)
MCHC: 31.9 g/dL (ref 30.0–36.0)
MCV: 91.8 fL (ref 78.0–100.0)
PLATELETS: 316 10*3/uL (ref 150–400)
RBC: 3.52 MIL/uL — ABNORMAL LOW (ref 3.87–5.11)
RDW: 14.4 % (ref 11.5–15.5)
WBC: 5.9 10*3/uL (ref 4.0–10.5)

## 2013-10-03 LAB — GLUCOSE, CAPILLARY
GLUCOSE-CAPILLARY: 133 mg/dL — AB (ref 70–99)
GLUCOSE-CAPILLARY: 147 mg/dL — AB (ref 70–99)
Glucose-Capillary: 96 mg/dL (ref 70–99)
Glucose-Capillary: 104 mg/dL — ABNORMAL HIGH (ref 70–99)

## 2013-10-03 LAB — HEMOGLOBIN A1C
HEMOGLOBIN A1C: 5.8 % — AB (ref <5.7)
Mean Plasma Glucose: 120 mg/dL — ABNORMAL HIGH (ref <117)

## 2013-10-03 LAB — TROPONIN I
Troponin I: <0.3 ng/mL (ref <0.30)
Troponin I: <0.3 ng/mL (ref <0.30)

## 2013-10-03 MED ORDER — METOPROLOL SUCCINATE ER 50 MG PO TB24
150.0000 mg | ORAL_TABLET | Freq: Every day | ORAL | Status: DC
  Administered 2013-10-03 – 2013-10-06 (×4): 150 mg via ORAL
  Filled 2013-10-03 (×4): qty 3

## 2013-10-03 MED ORDER — ASPIRIN EC 81 MG PO TBEC
81.0000 mg | DELAYED_RELEASE_TABLET | Freq: Every day | ORAL | Status: DC
  Administered 2013-10-03 – 2013-10-06 (×4): 81 mg via ORAL
  Filled 2013-10-03 (×4): qty 1

## 2013-10-03 NOTE — Telephone Encounter (Signed)
I spoke to patient's sister who was calling on behalf of patient, wanting [name] to call the doctor at the hospital and tell them how to taper down her steroid.  I explained that it's the hospital doctor who will call if he needs any information about the patient.  I told her I would make the doctor aware the patient was [facility] yesterday evening, and is available if one of her physicians has to speak to him.

## 2013-10-03 NOTE — Progress Notes (Signed)
Subjective: The patient relates some pain over the anterior chest today. She desires for sutures to be removed from the incision over the left side of head from previous biopsy. She continues to have anterior chest pain chest x-ray showed mild cardiac enlargement emphysematous change she does have COPD systolic dysfunction.   Objective: Vital signs in last 24 hours: Temp:  [97.7 F (36.5 C)-97.9 F (36.6 C)] 97.7 F (36.5 C) (03/12 2100) Pulse Rate:  [57-87] 57 (03/12 2100) Resp:  [16-20] 19 (03/12 2100) BP: (102-121)/(30-68) 116/52 mmHg (03/12 2100) SpO2:  [97 %-100 %] 99 % (03/12 2100) FiO2 (%):  [28 %] 28 % (03/12 1936) Weight change:     Intake/Output from previous day:   Intake/Output this shift:    Physical Exam: General appearance the patient is alert and oriented complaining of chest pain  HEENT eyes PERRLA TM negative oropharynx benign  Neck supple no JVD or thyroid abnormalities  Heart regular rhythm no murmurs  Lungs clear to P&A  Abdomen no palpable organs or masses  Skin patient has healing laceration of the left side of head   Recent Labs  10/02/13 1257 10/03/13 0206  WBC 6.5 5.9  HGB 10.1* 10.3*  HCT 32.7* 32.3*  PLT 314 316   BMET  Recent Labs  10/02/13 1257 10/03/13 0206  NA 138 137  K 4.4 4.5  CL 100 100  CO2 27 26  GLUCOSE 127* 120*  BUN 38* 33*  CREATININE 1.34* 1.17*  CALCIUM 9.3 9.3    Studies/Results: Ct Head Wo Contrast  10/02/2013   CLINICAL DATA:  Altered mental status  EXAM: CT HEAD WITHOUT CONTRAST  TECHNIQUE: Contiguous axial images were obtained from the base of the skull through the vertex without intravenous contrast. Study was obtained within 24 hr of patient's arrival at the emergency department.  COMPARISON:  August 18, 2013  FINDINGS: There is age related volume loss, stable. There is no demonstrable mass, hemorrhage, extra-axial fluid collection, or midline shift. There is minimal small vessel disease in the centra  semiovale bilaterally. Elsewhere, gray-white compartments appear normal. There is no demonstrable acute infarct. There is a focal defect in the superior posterior left frontal bone consistent with a burr hole. Bony calvarium otherwise appears intact. The mastoid air cells are clear. Orbits appear symmetric and normal bilaterally.  IMPRESSION: Minimal periventricular small vessel disease. No intracranial mass, hemorrhage, or acute infarct. Postoperative defect left frontal bone.   Electronically Signed   By: Bretta Bang M.D.   On: 10/02/2013 15:02   Dg Chest Portable 1 View  10/02/2013   CLINICAL DATA:  Chest pain  EXAM: PORTABLE CHEST - 1 VIEW  COMPARISON:  September 22, 2013  FINDINGS: There is a degree of underlying emphysematous change. There is no edema or consolidation. Heart is mildly enlarged with normal pulmonary vascularity. No adenopathy. No bone lesions.  IMPRESSION: Mild cardiac enlargement. Underlying emphysematous change. No edema or consolidation.   Electronically Signed   By: Bretta Bang M.D.   On: 10/02/2013 13:07    Medications:  . aspirin  325 mg Oral Daily  . citalopram  20 mg Oral BID  . clotrimazole   Topical BID  . dexamethasone  12 mg Oral Daily  . enoxaparin (LOVENOX) injection  40 mg Subcutaneous Q24H  . fenofibrate  160 mg Oral QHS  . furosemide  80 mg Oral Daily  . insulin aspart  0-9 Units Subcutaneous TID WC  . levothyroxine  100 mcg Oral QAC breakfast  .  Linaclotide  145 mcg Oral Daily  . lisinopril  2.5 mg Oral Daily  . LORazepam  0.5 mg Oral QHS  . metoprolol tartrate  37.5 mg Oral 3 times per day  . morphine  30 mg Oral BID  . pantoprazole  40 mg Oral BID  . potassium chloride SA  20 mEq Oral Daily  . vitamin B-12  1,000 mcg Oral Daily        Assessment/Plan: The patient was admitted with chest pain plan to continue serial troponins will obtain cardiology consult  COPD to continue nasal O2 and bronchodilators  Temporal arteritis to continue  prednisone  Systolic congestive heart failure 40% ejection fraction to continue furosemide and previous meds  Diabetes mellitus to continue to monitor blood sugars continue diabetic diet will start other medication   LOS: 1 day   Danelle Curiale G 10/03/2013, 6:19 AM

## 2013-10-03 NOTE — Telephone Encounter (Signed)
I called the patient's home. Left message. The sister called wanting me to tell the doctors at the hospital how to taper her off of of the dexamethasone. The patient should not be tapered off of the dexamethasone. The patient will need to continue on this dose for several months.

## 2013-10-03 NOTE — Consult Note (Signed)
CARDIOLOGY CONSULT NOTE   Patient ID: Kara [name] MRN: [id number] DOB/AGE: 03/01/1946 68 y.o.  Admit Date: 10/02/2013 Referring Physician: Butch Penny MD Primary Physician: [name], MD Consulting Cardiologist: Kara Rich MD Primary Cardiologist: Nona Dell MD Reason for Consultation:  Chest Pain  Clinical Summary Kara [name] is a 68 y.o.femalefemale admitted with left sided chest pain (chronic). She has history of  CAD, PAF, hypertension, hyperlipidemia, Diabetes, CHF, COPD on home O2. Recent temporal biopsy on 09/22/2012 in the setting of severe left-sided headaches and vision loss.     She states that she usually has chest pain once or twice a day, but had more severe chest pain at rest, with associated shortness of breath and diaphoresis. She also has history of palpitations. She was recently increased on BB in January on admission consultation, for atrial flutter, to 50 mg BID. A 14 day cardiac monitor was placed. "There was no evidence of fluttering, symptoms correlated with sinus tachycardia:, in Feb of 2015.[name]    She also speaks at lengthy about her intolerance to steroids. "They have reeked havoc on my body." She wants to stop them.  Labs have been reviewed, with negative cardiac markers X 4. EKG negative for acute abnormalities, there is T-wave inversion in the anterior leads, unchanged from EKG in January 2015, with improvement of T-wave inversion in the lateral leads this admission compared to January 2015.   She continues to have intermittent chest discomfort, but not severe.     Allergies  Allergen Reactions  . Prednisone     Can have in small doses  . Codeine Nausea And Vomiting and Rash    Medications Scheduled Medications: . aspirin  325 mg Oral Daily  . citalopram  20 mg Oral BID  . clotrimazole   Topical BID  . dexamethasone  12 mg Oral Daily  . enoxaparin (LOVENOX) injection  40 mg Subcutaneous Q24H  . fenofibrate  160 mg Oral QHS  .  furosemide  80 mg Oral Daily  . insulin aspart  0-9 Units Subcutaneous TID WC  . levothyroxine  100 mcg Oral QAC breakfast  . Linaclotide  145 mcg Oral Daily  . lisinopril  2.5 mg Oral Daily  . LORazepam  0.5 mg Oral QHS  . metoprolol tartrate  37.5 mg Oral 3 times per day  . morphine  30 mg Oral BID  . pantoprazole  40 mg Oral BID  . potassium chloride SA  20 mEq Oral Daily  . vitamin B-12  1,000 mcg Oral Daily       PRN Medications: acetaminophen, acetaminophen, acetaminophen, albuterol, ALPRAZolam, nitroGLYCERIN, ondansetron (ZOFRAN) IV, ondansetron, polyethylene glycol   Past Medical History  Diagnosis Date  . Type 2 diabetes mellitus   . Arthritis   . COPD (chronic obstructive pulmonary disease)     Home oxygen  . Palpitations     Reports history of atrial fibrillation, although none seen recently  . Coronary atherosclerosis of native coronary artery     Nonobstructive at catheterization 2008  . GERD (gastroesophageal reflux disease)   . Irritable bowel syndrome   . Chronic anemia   . Hypothyroidism   . Hyperlipidemia   . Chronic edema   . Pneumonia   . Diabetes mellitus   . Hypertension   . Chronic pain   . Anemia 08/04/2011  . Anemia of chronic disease 08/04/2011  . Bulging lumbar disc     3  . Headache(784.0) 07/30/2013  . Sprain of neck 07/30/2013  .  Asthma   . 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 Renal insufficiency 08/07/2011  . Claustrophobia     Past Surgical History  Procedure Laterality Date  . Cholecystectomy    . Appendectomy    . Foot surgery    . Abdominal hysterectomy    . Cataract extraction w/phaco Left 09/01/2013    Procedure: CATARACT EXTRACTION PHACO AND INTRAOCULAR LENS PLACEMENT (IOC);  Surgeon: Gemma Payor, MD;  Location: [facility];  Service: Ophthalmology;  Laterality: Left;  CDE:  14.13  . Artery biopsy Left 09/22/2013    Procedure: BIOPSY TEMPORAL ARTERY;  Surgeon: Mariella Saa, MD;  Location: [facility];  Service: General;  Laterality:  Left;    Family History  Problem Relation Age of Onset  . Hypertension    . Diabetes Brother   . Migraines Brother     Social History Ms. Sheehy reports that she quit smoking about 17 years ago. Her smoking use included Cigarettes. She has a 48 pack-year smoking history. She has never used smokeless tobacco. Ms. Zumwalt reports that she does not drink alcohol.  Review of Systems Otherwise reviewed and negative except as outlined.  Physical Examination Blood pressure 147/84, pulse 98, temperature 97.6 F (36.4 C), temperature source Axillary, resp. rate 20, height 5\' 6"  (1.676 m), weight 141 lb 14.4 oz (64.365 kg), SpO2 96.00%. No intake or output data in the 24 hours ending 10/03/13 0826  Telemetry: Normal sinus rhythm.  HEENT: Conjunctiva and lids normal, oropharynx clear with moist mucosa. Neck: Supple, no elevated JVP or carotid bruits, no thyromegaly. Lungs: Clear to auscultation, nonlabored breathing at rest. Cardiac: Regular rate and rhythm, no S3 or significant systolic murmur, no pericardial rub. Abdomen: Soft, nontender, no hepatomegaly, bowel sounds present, no guarding or rebound. Extremities: No pitting edema, distal pulses 2+. Skin: Warm and dry. Musculoskeletal: No kyphosis. Neuropsychiatric: Alert and oriented x3, affect grossly appropriate.  Prior Cardiac Testing/Procedures  1. Echocardiogram: 08/21/2013 Left ventricle: The cavity size was normal. Wall thickness was increased in a pattern of moderate LVH. Systolic function was moderately reduced. The estimated ejection fraction was 40%. Some views suggest better systolic function. Diffuse hypokinesis. Probable hypokinesis of the basalinferolateral myocardium. Doppler parameters are consistent with abnormal left ventricular relaxation (grade 1 diastolic dysfunction). - Aortic valve: Mildly calcified annulus. Trileaflet. Mild regurgitation. Mean gradient: 7mm Hg (S). - Aortic root: The aortic root was mildly  dilated. There is a mobile, linear echodensity noted within the proximal aortic arch that is concerning for a dissection flap, although not seen except in parasternal long axis view. Mid to distal aortic arch does not show similar finding, therefore cannot exclude arifact. If there is any clinical concern for possibility of aortic dissection, would proceed with further imaging such as chest CTA. - Mitral valve: Calcified annulus. Mild regurgitation. - Left atrium: The atrium was mildly dilated. - Right atrium: Central venous pressure: 3mm Hg (est). - Atrial septum: No defect or patent foramen ovale was identified. - Tricuspid valve: Mild regurgitation. - Pulmonary arteries: PA peak pressure: 37mm Hg (S). - Pericardium, extracardiac: A small pericardial effusion was identified posterior to the heart.  2. NM Stress test 08/21/2013 1. Abnormal Lexiscan MPI 2. Evidence of prior inferoseptal infarct with no current myocardium at jeopardy. 3. Low left ventricular systolic function, indicates high risk study for major cardiac events. Recommend correlate LVEF and wall motion   Cardiac Cath 04/2007 IMPRESSION: The patient does not have significant coronary artery  disease to explain these stereotypical episodes of  dyspnea and  diaphoresis. They would not appear to be anginal equivalent. There is  no evidence of pulmonary hypertension. She would not benefit from  pulmonary vasodilators. She will continue to follow up with her primary  care MD in regards to her home O2 and emphysema.  Lab Results   Basic Metabolic Panel:  Recent Labs Lab 10/02/13 1257 10/03/13 0206  NA 138 137  K 4.4 4.5  CL 100 100  CO2 27 26  GLUCOSE 127* 120*  BUN 38* 33*  CREATININE 1.34* 1.17*  CALCIUM 9.3 9.3    CBC:  Recent Labs Lab 10/02/13 1257 10/03/13 0206  WBC 6.5 5.9  NEUTROABS 4.4  --   HGB 10.1* 10.3*  HCT 32.7* 32.3*  MCV 93.2 91.8  PLT 314 316    Cardiac Enzymes:  Recent  Labs Lab 10/02/13 1257 10/02/13 1950 10/03/13 0206 10/03/13 0715  TROPONINI <0.30 <0.30 <0.30 <0.30    Radiology: Ct Head Wo Contrast  10/02/2013   CLINICAL DATA:  Altered mental status  EXAM: CT HEAD WITHOUT CONTRAST  TECHNIQUE: Contiguous axial images were obtained from the base of the skull through the vertex without intravenous contrast. Study was obtained within 24 hr of patient's arrival at the emergency department.  COMPARISON:  August 18, 2013  FINDINGS: There is age related volume loss, stable. There is no demonstrable mass, hemorrhage, extra-axial fluid collection, or midline shift. There is minimal small vessel disease in the centra semiovale bilaterally. Elsewhere, gray-white compartments appear normal. There is no demonstrable acute infarct. There is a focal defect in the superior posterior left frontal bone consistent with a burr hole. Bony calvarium otherwise appears intact. The mastoid air cells are clear. Orbits appear symmetric and normal bilaterally.  IMPRESSION: Minimal periventricular small vessel disease. No intracranial mass, hemorrhage, or acute infarct. Postoperative defect left frontal bone.   Electronically Signed   By: Bretta Bang M.D.   On: 10/02/2013 15:02   Dg Chest Portable 1 View  10/02/2013   CLINICAL DATA:  Chest pain  EXAM: PORTABLE CHEST - 1 VIEW  COMPARISON:  September 22, 2013  FINDINGS: There is a degree of underlying emphysematous change. There is no edema or consolidation. Heart is mildly enlarged with normal pulmonary vascularity. No adenopathy. No bone lesions.  IMPRESSION: Mild cardiac enlargement. Underlying emphysematous change. No edema or consolidation.   Electronically Signed   By: Bretta Bang M.D.   On: 10/02/2013 13:07     ECG: NSR with T-wave inversion in the anterior leads, mild LVH.    Impression and Recommendations  1. Chest Pain: Typical and atypical features, with chronic chest pain. She states this was far more severe, with  associated diaphoresis and some shortness of breath. She thinks its related to steroids. EKG and cardiac markers argue against ACS. No evidence of CHF on CXR, only emphysematous changes.    Abnormal stress test in January 2015, but no myocardium in jeopardy. Doubt need to re-study. Can consider repeat cath at Dr. Verna Czech discretion. Continue ASA,.   2. Systolic Dysfunction:  EF of 40% on most recent echo. She remains on lisinopril, furosemide, metoprolol 37.5 Q eight hours.  BP is well controlled.   3. COPD:  Oxygen dependent. Continues to have some DOE . She is sedentary.   4. Anemia:  Hgb 10.3 this admission. Baseline for her.   5. Thyroid Disease:  On synthroid.  Signed: Bettey Mare. Kara Bishop NP [facility] 10/03/2013, 8:26 AM Co-Sign MD  Attending Note Patient  seen and discussed with NP Kara [name]. History of DM, COPD, non-obstructive CAD, HL, and chronic chest pain admitted with chest pain. She had an admission in Jan 2015 for headaches, after receiving prednisone for headaches had systemic reaction along with aflutter with RVR to 147, mild trop elevation at that time 0.33. Stress MPI Jan 2015 showed inferoseptal scar with no ischemia, decreased LVEF, and no ischemia. Echo shows LVEF 40% with probable basalinferolateral hypokinesis. Her LVEF 02/2011 was 50-55%.  Follow up event monitor showed no recurrence of aflutter, symptoms of palpitations correlated with NSR and sinus tach.  Last admission had changed to Toprol XL in the setting of systolic dysfunction however appears she continued on lopressor at home. She is on fairly limited antianginal home regimen as well as limited systolic HF regimen. She describes atypical chest pain for several years under her left breast this is reproducible, states has gotten worst since prednisone was restarted. Her palpitations had worsened as well however improved after increasing lopressor to 37.5mg  tid.    She has chronic non-specific ST/T changes  that are unchanged. Troponins are negative, there is no evidence of ACS.   Overall she has atypical chest pain that is reproducible and similar in character to her long history of chest pain, though increased severity since starting recent steroids. She has evidence of prior MI by nuclear with no active ischemia, and mild drop in her LVEF since 2012. There is no indication for inpatient cath at this time, can be considered as outpatient given her drop in LVEF with evidence of prior infarct however will defer to her primary cardiologist Dr Diona BrownerMcDowell. She needs further optimization of medical therapy, will change lopressor to Toprol XL 150mg  daily for systolic dysfunction, more potent antianginal effect, and palpitations. Further titration as outpatient. She needs to follow up with Dr Diona BrownerMcDowell or NP Kara [name] 2 weeks after discharge.     Kara RichJonathan Ethelreda Sukhu MD

## 2013-10-03 NOTE — Evaluation (Signed)
Physical Therapy Evaluation Patient Details Name: Kara [name] MRN: [id number] DOB: 1945/08/18 Today's Date: 10/03/2013 Time: 4010-2725 PT Time Calculation (min): 35 min  PT Assessment / Plan / Recommendation History of Present Illness  Pt was admitted due to chest pain.  She is known to this therapist from previous admission.  She lives with her niece and is very sedentary at home.  Clinical Impression   Pt was seen for evaluation.  She was alert and cooperative, able to follow all directions.  Pt appears to be much older than her age and is extremely anxious about her health problems.  Because of this anxiety (which is baseline) she minimizes her activity level.  Currently, she is at functional baseline.  She is able to ambulate a functional distance with a walker.  She has a walker at home and uses it as needed.  She declines HHPT (she states that the previous HHPT had to d/c tx due to her multiple cancellations secondary to her "health problems".  We will ask nursing service to ambulate her while she is here.    PT Assessment  Patent does not need any further PT services    Follow Up Recommendations  No PT follow up (declines HHPT)       Barriers to Discharge        Equipment Recommendations  None recommended by PT             Precautions / Restrictions Precautions Precautions: None Restrictions Weight Bearing Restrictions: No         Mobility  Bed Mobility Overal bed mobility: Modified Independent Transfers Overall transfer level: Modified independent Equipment used: None Ambulation/Gait Ambulation/Gait assistance: Supervision Ambulation Distance (Feet): 100 Feet Assistive device: Rolling walker (2 wheeled) Gait Pattern/deviations: WFL(Within Functional Limits) Gait velocity interpretation: Below normal speed for age/gender General Gait Details: gait with walker is stable.           PT Goals(Current goals can be found in the care plan section) Acute  Rehab PT Goals PT Goal Formulation: No goals set, d/c therapy  Visit Information  Last PT Received On: 10/03/13 History of Present Illness: Pt was admitted due to chest pain.  She is known to this therapist from previous admission.  She lives with her niece and is very sedentary at home.       Prior Functioning  Home Living Family/patient expects to be discharged to:: Private residence Living Arrangements: Other relatives Available Help at Discharge: Family;Available 24 hours/day Type of Home: House Home Access: Ramped entrance Home Layout: One level Home Equipment: Walker - 2 wheels Prior Function Level of Independence: Independent Communication Communication: No difficulties    Cognition  Cognition Arousal/Alertness: Awake/alert Behavior During Therapy: Anxious Overall Cognitive Status: Within Functional Limits for tasks assessed    Extremity/Trunk Assessment Lower Extremity Assessment Lower Extremity Assessment: Overall WFL for tasks assessed   Balance Balance Overall balance assessment: No apparent balance deficits (not formally assessed)  End of Session PT - End of Session Equipment Utilized During Treatment: Gait belt Activity Tolerance: Patient tolerated treatment well Patient left: in bed;with call bell/phone within reach;with bed alarm set Nurse Communication: Mobility status  GP Functional Assessment Tool Used: clinical judgement Functional Limitation: Mobility: Walking and moving around Mobility: Walking and Moving Around Current Status ([id number]): At least 1 percent but less than 20 percent impaired, limited or restricted Mobility: Walking and Moving Around Goal Status [phone]): At least 1 percent but less than 20 percent impaired, limited or restricted Mobility: Walking  and Moving Around Discharge Status [phone]([id number]): At least 1 percent but less than 20 percent impaired, limited or restricted   Konrad PentaBrown, Jenson Beedle L 10/03/2013, 11:14 AM

## 2013-10-04 LAB — GLUCOSE, CAPILLARY
Glucose-Capillary: 95 mg/dL (ref 70–99)
Glucose-Capillary: 123 mg/dL — ABNORMAL HIGH (ref 70–99)
Glucose-Capillary: 138 mg/dL — ABNORMAL HIGH (ref 70–99)
Glucose-Capillary: 157 mg/dL — ABNORMAL HIGH (ref 70–99)

## 2013-10-04 MED ORDER — BIOTENE DRY MOUTH MT LIQD
15.0000 mL | Freq: Two times a day (BID) | OROMUCOSAL | Status: DC
  Administered 2013-10-04 – 2013-10-06 (×3): 15 mL via OROMUCOSAL

## 2013-10-04 NOTE — Plan of Care (Signed)
Problem: Phase I Progression Outcomes Goal: Anginal pain relieved Outcome: Completed/Met Date Met:  10/04/13 10/04/13 1142 no c/o chest pain this morning. Donavan Foil, RN

## 2013-10-04 NOTE — Progress Notes (Signed)
Subjective: The patient continues to have some anterior chest pain she does have evidence of COPD and CHF with systolic dysfunction she was seen by cardiology and was noted that on her echocardiogram in January 2015 and she had an estimated ejection fraction of 40% she had previous abnormal stress test  It was noted that she does not have significant coronary artery disease or pulmonary hypertension. There is no evidence of CHF on chest x-ray  Objective: Vital signs in last 24 hours: Temp:  [97.5 F (36.4 C)-98 F (36.7 C)] 97.5 F (36.4 C) (03/14 0705) Pulse Rate:  [61-68] 65 (03/14 0705) Resp:  [16-20] 20 (03/14 0705) BP: (93-142)/(54-92) 119/68 mmHg (03/14 0705) SpO2:  [96 %-100 %] 99 % (03/14 0705) Weight:  [65.318 kg (144 lb)] 65.318 kg (144 lb) (03/14 0705) Weight change:     Intake/Output from previous day: 03/13 0701 - 03/14 0700 In: 360 [P.O.:360] Out: 450 [Urine:450] Intake/Output this shift:    Physical Exam: Patient is alert and oriented  H. EENT negative  Neck supple no JVD or thyroid abnormalities  Lungs clear to P&A  Heart regular rhythm no murmurs  Abdomen soft no palpable organs or masses  Extremities no pitting edema   Recent Labs  10/02/13 1257 10/03/13 0206  WBC 6.5 5.9  HGB 10.1* 10.3*  HCT 32.7* 32.3*  PLT 314 316   BMET  Recent Labs  10/02/13 1257 10/03/13 0206  NA 138 137  K 4.4 4.5  CL 100 100  CO2 27 26  GLUCOSE 127* 120*  BUN 38* 33*  CREATININE 1.34* 1.17*  CALCIUM 9.3 9.3    Studies/Results: Ct Head Wo Contrast  10/02/2013   CLINICAL DATA:  Altered mental status  EXAM: CT HEAD WITHOUT CONTRAST  TECHNIQUE: Contiguous axial images were obtained from the base of the skull through the vertex without intravenous contrast. Study was obtained within 24 hr of patient's arrival at the emergency department.  COMPARISON:  August 18, 2013  FINDINGS: There is age related volume loss, stable. There is no demonstrable mass, hemorrhage,  extra-axial fluid collection, or midline shift. There is minimal small vessel disease in the centra semiovale bilaterally. Elsewhere, gray-white compartments appear normal. There is no demonstrable acute infarct. There is a focal defect in the superior posterior left frontal bone consistent with a burr hole. Bony calvarium otherwise appears intact. The mastoid air cells are clear. Orbits appear symmetric and normal bilaterally.  IMPRESSION: Minimal periventricular small vessel disease. No intracranial mass, hemorrhage, or acute infarct. Postoperative defect left frontal bone.   Electronically Signed   By: Bretta Bang M.D.   On: 10/02/2013 15:02   Dg Chest Portable 1 View  10/02/2013   CLINICAL DATA:  Chest pain  EXAM: PORTABLE CHEST - 1 VIEW  COMPARISON:  September 22, 2013  FINDINGS: There is a degree of underlying emphysematous change. There is no edema or consolidation. Heart is mildly enlarged with normal pulmonary vascularity. No adenopathy. No bone lesions.  IMPRESSION: Mild cardiac enlargement. Underlying emphysematous change. No edema or consolidation.   Electronically Signed   By: Bretta Bang M.D.   On: 10/02/2013 13:07    Medications:  . aspirin EC  81 mg Oral Daily  . citalopram  20 mg Oral BID  . clotrimazole   Topical BID  . dexamethasone  12 mg Oral Daily  . enoxaparin (LOVENOX) injection  40 mg Subcutaneous Q24H  . fenofibrate  160 mg Oral QHS  . furosemide  80 mg Oral Daily  .  insulin aspart  0-9 Units Subcutaneous TID WC  . levothyroxine  100 mcg Oral QAC breakfast  . Linaclotide  145 mcg Oral Daily  . lisinopril  2.5 mg Oral Daily  . LORazepam  0.5 mg Oral QHS  . metoprolol succinate  150 mg Oral Daily  . morphine  30 mg Oral BID  . pantoprazole  40 mg Oral BID  . potassium chloride SA  20 mEq Oral Daily  . vitamin B-12  1,000 mcg Oral Daily        Assessment/Plan: 1. Chest pain atypical with associated COPD plan to continue nasal O2 and bronchodilators  2.  Temporal arteritis to continue prednisone  3. Systolic congestive heart failure with 40% ejection fraction plan to continue furosemide  3. Diabetes mellitus continue diet and diabetic medications   LOS: 2 days   Livingston Denner G 10/04/2013, 7:21 AM

## 2013-10-05 LAB — GLUCOSE, CAPILLARY
GLUCOSE-CAPILLARY: 107 mg/dL — AB (ref 70–99)
GLUCOSE-CAPILLARY: 119 mg/dL — AB (ref 70–99)
Glucose-Capillary: 167 mg/dL — ABNORMAL HIGH (ref 70–99)
Glucose-Capillary: 246 mg/dL — ABNORMAL HIGH (ref 70–99)

## 2013-10-05 NOTE — Progress Notes (Signed)
10/05/13 1705 Patient assisted up to chair today, tolerated well. Chair alarm in place for safety. Ambulates in room. Offered to assist with ambulation in hallway, patient stated would prefer to rest. Will monitor and progress activity as tolerated. Earnstine RegalAshley Maikel Neisler, RN

## 2013-10-05 NOTE — Progress Notes (Signed)
Subjective: The patient is more comfortable. She continues to have some anterior chest pain. She does have COPD and CHF with systolic dysfunction. She does not have evidence of CHF on chest x-ray. Sutures were removed from the incision   Objective: Vital signs in last 24 hours: Temp:  [97.9 F (36.6 C)-98.1 F (36.7 C)] 98 F (36.7 C) (03/15 0438) Pulse Rate:  [55-66] 55 (03/15 0438) Resp:  [20] 20 (03/15 0438) BP: (121-123)/(40-67) 123/40 mmHg (03/15 0438) SpO2:  [96 %-100 %] 100 % (03/15 0438) Weight:  [63.322 kg (139 lb 9.6 oz)] 63.322 kg (139 lb 9.6 oz) (03/15 0438) Weight change:  Last BM Date: 10/03/13  Intake/Output from previous day: 03/14 0701 - 03/15 0700 In: 600 [P.O.:600] Out: 200 [Urine:200] Intake/Output this shift:    Physical Exam: The patient is alert and oriented  HEENT negative  Neck supple no JVD or thyroid abnormalities  Lungs clear to P&A  Heart regular rhythm no murmurs  Abdomen soft no palpable organs or masses  Extremities no pitting edema   Recent Labs  10/02/13 1257 10/03/13 0206  WBC 6.5 5.9  HGB 10.1* 10.3*  HCT 32.7* 32.3*  PLT 314 316   BMET  Recent Labs  10/02/13 1257 10/03/13 0206  NA 138 137  K 4.4 4.5  CL 100 100  CO2 27 26  GLUCOSE 127* 120*  BUN 38* 33*  CREATININE 1.34* 1.17*  CALCIUM 9.3 9.3    Studies/Results: No results found.  Medications:  . antiseptic oral rinse  15 mL Mouth Rinse BID  . aspirin EC  81 mg Oral Daily  . citalopram  20 mg Oral BID  . clotrimazole   Topical BID  . dexamethasone  12 mg Oral Daily  . enoxaparin (LOVENOX) injection  40 mg Subcutaneous Q24H  . fenofibrate  160 mg Oral QHS  . furosemide  80 mg Oral Daily  . insulin aspart  0-9 Units Subcutaneous TID WC  . levothyroxine  100 mcg Oral QAC breakfast  . Linaclotide  145 mcg Oral Daily  . lisinopril  2.5 mg Oral Daily  . LORazepam  0.5 mg Oral QHS  . metoprolol succinate  150 mg Oral Daily  . morphine  30 mg Oral BID  .  pantoprazole  40 mg Oral BID  . potassium chloride SA  20 mEq Oral Daily  . vitamin B-12  1,000 mcg Oral Daily        Assessment/Plan: 1 atypical chest pain COPD plan to continue nasal O2 and bronchodilators  Temporal arteritis plan to continue prednisone  Systolic congestive heart failure with 40% ejection fraction plan continue furosemide  Diabetes mellitus plan to continue diabetic medications   LOS: 3 days   Roque Schill G 10/05/2013, 7:06 AM

## 2013-10-06 LAB — GLUCOSE, CAPILLARY
GLUCOSE-CAPILLARY: 193 mg/dL — AB (ref 70–99)
Glucose-Capillary: 117 mg/dL — ABNORMAL HIGH (ref 70–99)

## 2013-10-06 MED ORDER — METOPROLOL SUCCINATE ER 50 MG PO TB24: 50.0000 mg | ORAL_TABLET | Freq: Every day | ORAL | Status: DC

## 2013-10-06 NOTE — Discharge Summary (Signed)
Physician Discharge Summary  Kara [name] WGN:[id number] DOB: 05/21/1946 DOA: 10/02/2013  PCP: Kara ReichertMCINNIS,[name], MD  Admit date: 10/02/2013 Discharge date: 10/06/2013     Discharge Diagnoses:  1. Atypical chest pain prior MI 2. Acute on chronic respiratory failure COPD 3. Temporal arteritis 4. Systolic congestive heart failure 40% ejection fraction 5. Diabetes mellitus type 2 6. Hyperlipidemia 7. Hypothyroidism  Discharge Condition:Disposition: Stable  Diet recommendation: Low cholesterol diabetic diet  Filed Weights   10/03/13 0630 10/04/13 0705 10/05/13 0438  Weight: 64.365 kg (141 lb 14.4 oz) 65.318 kg (144 lb) 63.322 kg (139 lb 9.6 oz)    History of present illness:  The patient presented to the ED with left-sided chest pain which lasted 15-20 minutes she had chronic weakness and fatigue shortness or breath had recent treatment for temporal arteritis and had known congestive heart failure. With these issues she was admitted  Hospital Course:  The patient remained on nasal oxygen throughout the hospital course. She should remain on the following medications furosemide Zestril metoprolol when necessary nitroglycerin Synthroid) Protonix and her troponins remained normal. She was seen in consultation by cardiology they felt l that she had systolic congestive heart failure with ejection fraction 40% but there was no evidence of failure on chest x-ray EKGs with persistent negative with exception of T wave inversions in anterior leads. No further workup was felt indicated at the present time they felt that she should have Toprol XL 150 mg daily. Also it was felt that she should be seen by cardiology after 2 weeks her stitches were removed from the head [name] was consulted and recommended that the steroid be continued for an indefinite period of time her blood sugars remained stable although she continues to have some chest pain and some headache it was felt she could be  discharged . Discharge Instructions   Future Appointments Provider Department Dept Phone   10/14/2013 12:00 PM York Spanielharles K Willis, MD [facility] [phone]   11/25/2013 3:00 PM Malissa HippoNajeeb U Rehman, MD [facility] [phone]   03/05/2014 10:00 AM York Spanielharles K Willis, MD [facility] [phone]       Medication List    STOP taking these medications       polyethylene glycol packet  Commonly known as:  MIRALAX / GLYCOLAX      TAKE these medications       acetaminophen 325 MG tablet  Commonly known as:  TYLENOL  Take 650 mg by mouth every 6 (six) hours as needed for mild pain.     ALPRAZolam 1 MG tablet  Commonly known as:  XANAX  Take 1 mg by mouth 3 (three) times daily as needed for anxiety.     citalopram 20 MG tablet  Commonly known as:  CELEXA  Take 20 mg by mouth 2 (two) times daily.     clotrimazole-betamethasone cream  Commonly known as:  LOTRISONE  Apply 1 application topically 2 (two) times daily. Apply to ear     dexamethasone 4 MG tablet  Commonly known as:  DECADRON  Take 12 mg by mouth daily.     fenofibrate 160 MG tablet  Take 160 mg by mouth at bedtime.     furosemide 80 MG tablet  Commonly known as:  LASIX  Take 80 mg by mouth daily.     KLOR-CON M20 PO  Take 20 mEq by mouth daily.     levothyroxine 100 MCG tablet  Commonly known as:  SYNTHROID, LEVOTHROID  Take 100 mcg by mouth daily.     LINZESS 145 MCG Caps capsule  Generic drug:  Linaclotide  Take 145 mcg by mouth daily.     lisinopril 2.5 MG tablet  Commonly known as:  PRINIVIL,ZESTRIL  Take 1 tablet (2.5 mg total) by mouth daily.     metFORMIN 1000 MG tablet  Commonly known as:  GLUCOPHAGE  Take 1,000 mg by mouth 2 (two) times daily with a meal.     metoprolol succinate 50 MG 24 hr tablet  Commonly known as:  TOPROL-XL  Take 1 tablet (50 mg total) by mouth daily. Take with or immediately following a meal.     metoprolol  tartrate 25 MG tablet  Commonly known as:  LOPRESSOR  Take 1.5 tablets (37.5 mg total) by mouth 3 (three) times daily.     morphine 30 MG 12 hr tablet  Commonly known as:  MS CONTIN  Take 30 mg by mouth 2 (two) times daily. To treat pain associated with 3 bulging disc in back     nitroGLYCERIN 0.4 MG SL tablet  Commonly known as:  NITROSTAT  Place 0.4 mg under the tongue every 5 (five) minutes as needed for chest pain.     pantoprazole 40 MG tablet  Commonly known as:  PROTONIX  Take 40 mg by mouth 2 (two) times daily.     vitamin B-12 1000 MCG tablet  Commonly known as:  CYANOCOBALAMIN  Take 1,000 mcg by mouth daily.       Allergies  Allergen Reactions  . Prednisone     Can have in small doses  . Codeine Nausea And Vomiting and Rash    The results of significant diagnostics from this hospitalization (including imaging, microbiology, ancillary and laboratory) are listed below for reference.    Significant Diagnostic Studies: Dg Chest 2 View  09/22/2013   CLINICAL DATA:  Hypertension; preoperative evaluation  EXAM: CHEST  2 VIEW  COMPARISON:  Chest radiograph January 30, 2013 and chest CT August 22, 2013  FINDINGS: There is a degree of underlying emphysematous change. There is no edema or consolidation. The heart size is normal. The pulmonary vascularity reflects underlying emphysema. Prominence in the ascending thoracic aortic region appear stable. No adenopathy. No bone lesions.  IMPRESSION: Prominence of the ascending thoracic aorta remains without change. Underlying emphysema. No edema or consolidation.   Electronically Signed   By: Bretta Bang M.D.   On: 09/22/2013 08:41   Ct Head Wo Contrast  10/02/2013   CLINICAL DATA:  Altered mental status  EXAM: CT HEAD WITHOUT CONTRAST  TECHNIQUE: Contiguous axial images were obtained from the base of the skull through the vertex without intravenous contrast. Study was obtained within 24 hr of patient's arrival at the emergency  department.  COMPARISON:  August 18, 2013  FINDINGS: There is age related volume loss, stable. There is no demonstrable mass, hemorrhage, extra-axial fluid collection, or midline shift. There is minimal small vessel disease in the centra semiovale bilaterally. Elsewhere, gray-white compartments appear normal. There is no demonstrable acute infarct. There is a focal defect in the superior posterior left frontal bone consistent with a burr hole. Bony calvarium otherwise appears intact. The mastoid air cells are clear. Orbits appear symmetric and normal bilaterally.  IMPRESSION: Minimal periventricular small vessel disease. No intracranial mass, hemorrhage, or acute infarct. Postoperative defect left frontal bone.   Electronically Signed   By: Bretta Bang M.D.   On: 10/02/2013 15:02   US Carotid Duplex Bilateral  09/17/2013   CLINICAL DATA:  Headache  EXAM: BILATERAL CAROTID DUPLEX ULTRASOUND  TECHNIQUE: Wallace Cullens scale imaging, color Doppler and duplex ultrasound were performed of bilateral carotid and vertebral arteries in the neck.  COMPARISON:  None.  FINDINGS: Criteria: Quantification of carotid stenosis is based on velocity parameters that correlate the residual internal carotid diameter with NASCET-based stenosis levels, using the diameter of the distal internal carotid lumen as the denominator for stenosis measurement.  The following velocity measurements were obtained:  RIGHT  ICA:  129 cm/sec  CCA:  63 cm/sec  SYSTOLIC ICA/CCA RATIO:  2.04  DIASTOLIC ICA/CCA RATIO:  ECA:  95 cm/sec  LEFT  ICA:  98 cm/sec  CCA:  61 cm/sec  SYSTOLIC ICA/CCA RATIO:  1.6  DIASTOLIC ICA/CCA RATIO:  ECA:  63 cm/sec  RIGHT CAROTID ARTERY: There is focal calcified plaque in the bulb. Low resistance internal carotid Doppler pattern.  RIGHT VERTEBRAL ARTERY:  Antegrade.  Normal Doppler pattern.  LEFT CAROTID ARTERY: Mild calcified plaque in the bulb. Low resistance internal carotid Doppler pattern.  LEFT VERTEBRAL ARTERY:   Antegrade.  Normal Doppler pattern.  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  Less than 50% stenosis in the left internal carotid artery.   Electronically Signed   By: Maryclare Bean M.D.   On: 09/17/2013 14:57   Dg Chest Portable 1 View  10/02/2013   CLINICAL DATA:  Chest pain  EXAM: PORTABLE CHEST - 1 VIEW  COMPARISON:  September 22, 2013  FINDINGS: There is a degree of underlying emphysematous change. There is no edema or consolidation. Heart is mildly enlarged with normal pulmonary vascularity. No adenopathy. No bone lesions.  IMPRESSION: Mild cardiac enlargement. Underlying emphysematous change. No edema or consolidation.   Electronically Signed   By: Bretta Bang M.D.   On: 10/02/2013 13:07    Microbiology: No results found for this or any previous visit (from the past 240 hour(s)).   Labs: Basic Metabolic Panel:  Recent Labs Lab 10/02/13 1257 10/03/13 0206  NA 138 137  K 4.4 4.5  CL 100 100  CO2 27 26  GLUCOSE 127* 120*  BUN 38* 33*  CREATININE 1.34* 1.17*  CALCIUM 9.3 9.3   Liver Function Tests: No results found for this basename: AST, ALT, ALKPHOS, BILITOT, PROT, ALBUMIN,  in the last 168 hours No results found for this basename: LIPASE, AMYLASE,  in the last 168 hours No results found for this basename: AMMONIA,  in the last 168 hours CBC:  Recent Labs Lab 10/02/13 1257 10/03/13 0206  WBC 6.5 5.9  NEUTROABS 4.4  --   HGB 10.1* 10.3*  HCT 32.7* 32.3*  MCV 93.2 91.8  PLT 314 316   Cardiac Enzymes:  Recent Labs Lab 10/02/13 1257 10/02/13 1950 10/03/13 0206 10/03/13 0715  TROPONINI <0.30 <0.30 <0.30 <0.30   BNP: BNP (last 3 results)  Recent Labs  10/02/13 1257  PROBNP 4681.0*   CBG:  Recent Labs Lab 10/04/13 2212 10/05/13 0723 10/05/13 1152 10/05/13 1703 10/05/13 2117  GLUCAP 157* 107* 119* 167* 246*    Principal Problem:   Chest pain Active Problems:   COPD (chronic obstructive pulmonary disease)   Essential  hypertension, benign   Coronary atherosclerosis of native coronary artery   Hypothyroidism   Chronic diastolic heart failure   Headache(784.0)   Atrial flutter   Systolic dysfunction   Time coordinating discharge: 30 minutes  Signed:  Butch Penny, MD 10/06/2013, 6:38 AM

## 2013-10-06 NOTE — Plan of Care (Signed)
Problem: Discharge Progression Outcomes Goal: Barriers To Progression Addressed/Resolved Outcome: Completed/Met Date Met:  10/06/13 Pt is on home o2     

## 2013-10-06 NOTE — Plan of Care (Signed)
Problem: COPD GOLD Progrssion Goal: Able to Perform ADL's at Baseline Outcome: Completed/Met Date Met:  10/06/13 At baseline

## 2013-10-06 NOTE — Care Management Note (Signed)
    Page 1 of 1   10/06/2013     11:19:15 AM   CARE MANAGEMENT NOTE 10/06/2013  Patient:  Kara [name],Kara [name]   Account Number:  [id number]  Date Initiated:  10/06/2013  Documentation initiated by:  Rosemary HolmsOBSON,Saga Balthazar  Subjective/Objective Assessment:   Pt admitted from home. States her neice lives with her but works days. Does have a friend who stays with her when needed (vague). Declines HH or DME needs.     Action/Plan:   DC today, Ride to bring O2 canister for DC.   Anticipated DC Date:  10/06/2013   Anticipated DC Plan:  HOME/SELF CARE      DC Planning Services  CM consult      Choice offered to / List presented to:             Status of service:  Completed, signed off Medicare Important Message given?  YES (If response is "NO", the following Medicare IM given date fields will be blank) Date Medicare IM given:  10/06/2013 Date Additional Medicare IM given:    Discharge Disposition:  HOME/SELF CARE  Per UR Regulation:    If discussed at Long Length of Stay Meetings, dates discussed:    Comments:  10/06/13 Rosemary HolmsAmy Dorance Spink RN BSN CM

## 2013-10-06 NOTE — Plan of Care (Signed)
Problem: COPD GOLD Progrssion Goal: ABLE TO WEAN TO ROOM AIR Outcome: Adequate for Discharge At baseline uses o2 at home

## 2013-10-07 ENCOUNTER — Other Ambulatory Visit: Payer: Self-pay

## 2013-10-07 ENCOUNTER — Telehealth: Payer: Self-pay | Admitting: *Deleted

## 2013-10-07 MED ORDER — DEXAMETHASONE 4 MG PO TABS: 12.0000 mg | ORAL_TABLET | Freq: Every evening | ORAL | Status: DC

## 2013-10-07 NOTE — Telephone Encounter (Signed)
Spoke with patient,she was just discharged yesterday from [facility], Metoprolol Succinate Toprol XL 50 mg qd ordered by Dr.Branch, clarified with [facility] as this is correct drug and dose for patient.[facility] had different information from Dr.[name].Patient states she will pick up rx today as she took last dose yesterday in hospital

## 2013-10-07 NOTE — Telephone Encounter (Signed)
Pt states that the RX that was called in for metoprolol is not the correct dose

## 2013-10-08 NOTE — Telephone Encounter (Signed)
We had her on Toprol XL 150 mg daily in the hospital and stopped her lopressor. Dr Trenton FoundsMcCinnis arranged her discharge medications, my recommendation was 150mg  daily of Toprol XL. She needs to clarify with his office the difference in dose  Kara RichJonathan Branch MD

## 2013-10-08 NOTE — Telephone Encounter (Signed)
Cont'd from below. Called pharmacy and spoke with pharmacist about dose of metoprolol xl. Pharmacist told me that the 1st prescription they received for Dr. [name] was for toprol xl 150 MG 1 by mouth once daily, after pharmacy informed Dr. [name] that medication did not come in that dose he changed dose to 100 mg tablet 1 1/2 tablet by mouth once daily. Pt then called pharmacy and informed pharmacy that was the wrong dose and that she would not be picking up medication. This is when pharmacy called and [facility] the medication and this is when it was told to them that 50 MG toprol xl was the correct dose. After receiving MD note below I called pt to inform pt that Dr. [name] office needed to be the office to change this medication. I also informed pt that she is past due for her follow up appointment with Dr. Diona BrownerMcDowell. Pt agreed to schedule an appointment. I will contact [facility] office and schedule this appt for this pt and will call pt again with appt date and time. Pt is scheduled to see Dr. Diona BrownerMcDowell for May 5th at 1:20 PM. Pt agreed to appt.

## 2013-10-08 NOTE — Telephone Encounter (Signed)
Informed pt of below. Pt stated that she wanted to stay on dose the Dr. Wyline MoodBranch had her on in hospital. Pt wants to know if the 1 1/2 tablets of Metoprolol will be sufficient

## 2013-10-14 ENCOUNTER — Encounter (INDEPENDENT_AMBULATORY_CARE_PROVIDER_SITE_OTHER): Payer: Self-pay

## 2013-10-14 ENCOUNTER — Telehealth: Payer: Self-pay | Admitting: *Deleted

## 2013-10-14 ENCOUNTER — Ambulatory Visit (INDEPENDENT_AMBULATORY_CARE_PROVIDER_SITE_OTHER): Payer: Medicare Other | Admitting: Neurology

## 2013-10-14 ENCOUNTER — Encounter: Payer: Self-pay | Admitting: Neurology

## 2013-10-14 VITALS — BP 94/56 | HR 100 | Wt 142.0 lb

## 2013-10-14 DIAGNOSIS — I2510 Atherosclerotic heart disease of native coronary artery without angina pectoris: Secondary | ICD-10-CM

## 2013-10-14 DIAGNOSIS — R51 Headache: Secondary | ICD-10-CM

## 2013-10-14 DIAGNOSIS — M316 Other giant cell arteritis: Secondary | ICD-10-CM | POA: Insufficient documentation

## 2013-10-14 NOTE — Progress Notes (Signed)
Reason for visit: Temporal arteritis  Kara [name] is an 68 y.o. female  History of present illness:  Kara [name] is a 68 year old right-handed white female with a history of left temporal headaches that occurred following a fall in November 2018 striking her left head. The patient has continued to have headaches. The patient has been found to have temporal arteritis after she lost vision in her left eye following cataract surgery. The sedimentation rate was greater than 140. The patient had a temporal artery biopsy on the left which was consistent with temporal arteritis. The patient been placed on dexamethasone taking 4 mg tablets, 3 tablets daily. The patient is having some side effects on the dexamethasone with increased appetite, anxiety, chest pain. The patient is having increasing episodes of atrial fibrillation. The patient is followed through cardiology. The patient has had some improvement in the headaches, however, but the headaches continue. The patient has had no changes in vision per se in the right eye, the left eye remains blind. The patient is being followed through an ophthalmologist, she may need glasses as she is becoming farsighted.  Past Medical History  Diagnosis Date  . Type 2 diabetes mellitus   . Arthritis   . COPD (chronic obstructive pulmonary disease)     Home oxygen  . Palpitations     Reports history of atrial fibrillation, although none seen recently  . Coronary atherosclerosis of native coronary artery     Nonobstructive at catheterization 2008  . GERD (gastroesophageal reflux disease)   . Irritable bowel syndrome   . Chronic anemia   . Hypothyroidism   . Hyperlipidemia   . Chronic edema   . Pneumonia   . Diabetes mellitus   . Hypertension   . Chronic pain   . Anemia 08/04/2011  . Anemia of chronic disease 08/04/2011  . Bulging lumbar disc     3  . Headache(784.0) 07/30/2013  . Sprain of neck 07/30/2013  . Asthma   . Visual loss, left eye  09/12/2013  . Renal insufficiency 08/07/2011  . Claustrophobia   . Temporal arteritis 10/14/2013    Past Surgical History  Procedure Laterality Date  . Cholecystectomy    . Appendectomy    . Foot surgery    . Abdominal hysterectomy    . Cataract extraction w/phaco Left 09/01/2013    Procedure: CATARACT EXTRACTION PHACO AND INTRAOCULAR LENS PLACEMENT (IOC);  Surgeon: Gemma Payor, MD;  Location: [facility];  Service: Ophthalmology;  Laterality: Left;  CDE:  14.13  . Artery biopsy Left 09/22/2013    Procedure: BIOPSY TEMPORAL ARTERY;  Surgeon: Mariella Saa, MD;  Location: [facility] OR;  Service: General;  Laterality: Left;    Family History  Problem Relation Age of Onset  . Hypertension    . Diabetes Brother   . Migraines Brother     Social history:  reports that she quit smoking about 17 years ago. Her smoking use included Cigarettes. She has a 48 pack-year smoking history. She has never used smokeless tobacco. She reports that she does not drink alcohol or use illicit drugs.    Allergies  Allergen Reactions  . Prednisone     Can have in small doses  . Codeine Nausea And Vomiting and Rash    Medications:  Current Outpatient Prescriptions on File Prior to Visit  Medication Sig Dispense Refill  . acetaminophen (TYLENOL) 325 MG tablet Take 650 mg by mouth every 6 (six) hours as needed for mild pain.       [name]  ALPRAZolam (XANAX) 1 MG tablet Take 1 mg by mouth 3 (three) times daily as needed for anxiety.       . citalopram (CELEXA) 20 MG tablet Take 20 mg by mouth 2 (two) times daily.       . clotrimazole-betamethasone (LOTRISONE) cream Apply 1 application topically 2 (two) times daily. Apply to ear      . dexamethasone (DECADRON) 4 MG tablet Take 3 tablets (12 mg total) by mouth Nightly.  90 tablet  3  . fenofibrate 160 MG tablet Take 160 mg by mouth at bedtime.      . furosemide (LASIX) 80 MG tablet Take 80 mg by mouth daily.       [name] levothyroxine (SYNTHROID, LEVOTHROID) 100 MCG tablet Take  100 mcg by mouth daily.        . Linaclotide (LINZESS) 145 MCG CAPS capsule Take 145 mcg by mouth daily.      [name] lisinopril (PRINIVIL,ZESTRIL) 2.5 MG tablet Take 1 tablet (2.5 mg total) by mouth daily.  30 tablet  5  . metFORMIN (GLUCOPHAGE) 1000 MG tablet Take 1,000 mg by mouth 2 (two) times daily with a meal.       . metoprolol succinate (TOPROL-XL) 50 MG 24 hr tablet Take 1 tablet (50 mg total) by mouth daily. Take with or immediately following a meal.  30 tablet  5  . morphine (MS CONTIN) 30 MG 12 hr tablet Take 30 mg by mouth 2 (two) times daily. To treat pain associated with 3 bulging disc in back      . nitroGLYCERIN (NITROSTAT) 0.4 MG SL tablet Place 0.4 mg under the tongue every 5 (five) minutes as needed for chest pain.       . pantoprazole (PROTONIX) 40 MG tablet Take 40 mg by mouth 2 (two) times daily.      . Potassium Chloride Crys CR (KLOR-CON M20 PO) Take 20 mEq by mouth daily.       . vitamin B-12 (CYANOCOBALAMIN) 1000 MCG tablet Take 1,000 mcg by mouth daily.       No current facility-administered medications on file prior to visit.    ROS:  Out of a complete 14 system review of symptoms, the patient complains only of the following symptoms, and all other reviewed systems are negative.  Visual disturbance Headache Shortness of breath Palpitations of the heart  Blood pressure 94/56, pulse 100, weight 142 lb (64.411 kg).  Physical Exam  General: The patient is alert and cooperative at the time of the examination.  Skin: 1-2+ edema below the knees is noted bilaterally.   Neurologic Exam  Mental status: The patient is oriented x 3.  Cranial nerves: Facial symmetry is present. Speech is normal, no aphasia or dysarthria is noted. Extraocular movements are full. Visual fields are full with the right eye, blind with the left eye.  Motor: The patient has good strength in all 4 extremities.  Sensory examination: Soft touch sensation on the face, arms, and legs is  symmetric.  Coordination: The patient has good finger-nose-finger and heel-to-shin bilaterally. The patient is somewhat tremulous, with a fine action tremor with finger-nose-finger.  Gait and station: The patient has a slightly wide-based gait. Tandem gait was not attempted. Romberg is negative. No drift is seen.  Reflexes: Deep tendon reflexes are symmetric.   Assessment/Plan:  1. Temporal arthritis  2. Headache, posttraumatic  3. Blindness, OS  The patient is to remain on dexamethasone taking 12 mg daily for now. The patient will followup  in 2 or 3 months, and we will consider initiating a taper from the dexamethasone at that time. The patient will contact me if any new changes are noted with the vision. The patient is to begin taking vitamin D supplementation.  Marlan Palau. Keith Willis MD 10/14/2013 7:43 PM  [facility] [address] [place], [place] [id number]  Phone [phone] Fax [phone]

## 2013-10-14 NOTE — Telephone Encounter (Signed)
I reviewed the chart - was not aware of her recent history. I have not seen Ms. Coufal since February 2013. She was recently seen in consultation by Dr. Wyline MoodBranch - records reviewed. She was on Lopressor 37.5 mg 3 times a day in the hospital, and Dr. Wyline MoodBranch recommended converting to Toprol-XL 150 mg daily. It looks like she was actually discharged on Toprol-XL 50 mg daily per Dr. [name]. I reviewed the recent telephone notes. She did not actually have any atrial fibrillation documented recently. For now would recommend increasing Toprol-XL to 75 mg daily. She needs to have a followup visit scheduled to assess her heart rate and rhythm, and tolerance of medications.

## 2013-10-14 NOTE — Patient Instructions (Signed)
Take Viatmin D 1000 IU daily.   Temporal Arteritis Arteries are blood vessels that carry blood from the heart to all parts of the body. Temporal arteritis is a swelling (inflammation) of certain large arteries. This usually affects arteries in the head and neck area, including arteries in the area on the side of the head, between the ears and eyes (temples). The condition can be very painful. It also can cause serious problems, even blindness. Early diagnosis and treatment is very important. CAUSES  Temporal arteritis results from the body reacting to injury or infection (inflammation). This may occur when the body's immune system (which fights germs and disease) makes a mistake. It attacks its own arteries. No one knows why this happens. However, certain things (risk factors) make it more likely that a person will develop temporal arteritis. They include:  Age. Most people with temporal arteritis are older than 50. The average age is 66.  Sex. Three times more women than men develop the condition.  Race and ethnic background. Caucasians are more likely to have temporal arteritis than other races. So are people whose families came from [place]  Having polymyalgia rheumatica (PMR). This condition causes stiffness and pain in the joints of the neck, shoulders and hips. About 15% of people with PMR also have temporal arteritis. SYMPTOMS  Not everyone with temporal arteritis has the same symptoms. Some people have just one symptom. Others may have several. The most common symptom is a new headache, often in the temple region. Symptoms may show up in other parts of the body too.   Symptoms affecting the head may include:  Temporal arteries that feel hard or swollen. It may hurt when the temples are touched.  Pain when combing your hair, or when laying your head on a pillow.  Pain in the jaw when chewing.  Pain in the throat or  tongue.  Visual problems, including sudden loss of vision in one eye, or seeing double.  Symptoms in other parts of the body may include:  Fever.  Fatigue.  A dry cough.  Pain in the hips and shoulders.  Pain in the arms during exercise.  Depression.  Weight loss. DIAGNOSIS  Symptoms of temporal arteritis are similar to symptoms for other conditions. This can make it hard to tell if you have the condition. To be sure, your caregiver will ask about your symptoms and do a physical exam. Certain tests may be necessary, such as:   An exam of your temples. Often, the temporal arteries will be swollen and hard. This can be felt.  A complete blood count. This test shows how many red blood cells are in your blood. Most people with temporal arteritis do not have enough red blood cells (anemic).  Erythrocyte sedimentation (also called sed rate test). It measures inflammation in the body. Almost everyone with temporal arteritis has a high sed rate.  C reactive protein test. This also shows if there is inflammation.  Biopsy a temporal artery. This means the caregiver will take out a small piece of an artery. Then, it is checked under a microscope for inflammation. More than one biopsy may be needed. That is because inflammation can be in one part of an artery and not in others. Your caregiver may need to check more than one spot. TREATMENT  Starting treatment right away is very important. Often, you will need to see a specialist in immunologic diseases (rheumatologist). Goals of treatment include protecting your  eyesight. Once vision is gone, it might not come back. The normal treatment is medication. It usually works well and quickly. Most people start getting better in a few days. Medication options include:  Corticosteroids. These are powerful drugs that fight inflammation. These drugs are most often used to treat temporal arteritis.  Usually, a high dose is taken at first. After symptoms  improve, a smaller dose is used. The goal is to take the smallest dose possible and still control your symptoms. That is because using corticosteroids for a long time can cause problems. They can make muscles and bones weak. They can cause blood pressure to go up, and cause diabetes. Also, people often gain weight when they take corticosteroids. Corticosteroids may need to be taken for one or two years.  Several newer drugs are being tested to treat temporal arteritis. Researchers are testing to see if new drugs will work as well as corticosteroids, but cause fewer problems than them. Testing of these drugs is not yet complete.  Some specialists recommend low dose aspirin to prevent blood clots. HOME CARE INSTRUCTIONS   Take any corticosteroids that your caregiver prescribes. Follow the directions carefully.  Take any vitamins or supplements that your caregiver suggests. This may include vitamin D and calcium. They help keep your bones from becoming weak.  Keep all appointments for checkups. Your caregiver will watch for any problems from the medication. Checkups may include:  Periodic blood tests.  Bone density testing. This checks how strong or weak your bones are.  Blood pressure checks. If your blood pressure rises, you may need to take a drug to control it while you are taking corticosteroids.  Blood sugar checks. This is to be sure you are not developing diabetes. If you have diabetes, corticosteroid medications may make it worse and require increased treatment.  Exercise. First, talk with your caregiver about what would be OK for you to do. Aerobic exercise (which increases your heart rate) is usually suggested. It does not have to require a lot of energy. Walking is aerobic exercise. This type of exercise is good because it helps prevent bone loss. It also helps control your blood pressure.  Follow a healthy diet. The goal is to prevent bone damage and diabetes. Include good sources of  protein in your diet. Also, include fruits, vegetables and whole grains. Your caregiver can refer you to an expert on healthy eating (dietitian) for more advice. SEEK MEDICAL CARE IF:   The symptoms that lead to your diagnosis return.  You develop worsening fever, fatigue, headache, weight loss, or pain in your jaw.  You develop signs of infection. Infections can be worse if you are on corticosteroid medication. SEEK IMMEDIATE MEDICAL CARE IF:   Your eyesight changes.  Pain does not go away, even after taking pain medicine.  You feel pain in your chest.  Breathing is difficult.  One side of your face or body suddenly becomes weak or numb.  You develop a fever of more than 102 F (38.9 C). Document Released: 05/07/2009 Document Revised: 10/02/2011 Document Reviewed: 05/07/2009 [facility] Patient Information 2014 [employer], [place].

## 2013-10-14 NOTE — Telephone Encounter (Signed)
Pt called office stating" she has been in a-fib since this morning. Pt states her heart rate is 110 and she wants her heart rate down. This nurse spoke to Dr Diona BrownerMcDowell, he increased the Toprol XL to 75 mg daily.  I told pt to go ahead and take 25 mg Toprol XL now to equal the 75 mg for the day. Pt has an appointment Monday October 20, 2013 at 2:30pm.

## 2013-10-20 ENCOUNTER — Encounter: Payer: Self-pay | Admitting: Adult Health

## 2013-10-20 ENCOUNTER — Ambulatory Visit (INDEPENDENT_AMBULATORY_CARE_PROVIDER_SITE_OTHER): Payer: Medicare Other | Admitting: Adult Health

## 2013-10-20 VITALS — BP 101/51 | HR 78 | Ht 66.0 in | Wt 142.0 lb

## 2013-10-20 DIAGNOSIS — I5032 Chronic diastolic (congestive) heart failure: Secondary | ICD-10-CM

## 2013-10-20 DIAGNOSIS — R002 Palpitations: Secondary | ICD-10-CM

## 2013-10-20 DIAGNOSIS — I519 Heart disease, unspecified: Secondary | ICD-10-CM

## 2013-10-20 DIAGNOSIS — I2510 Atherosclerotic heart disease of native coronary artery without angina pectoris: Secondary | ICD-10-CM

## 2013-10-20 NOTE — Assessment & Plan Note (Signed)
Feels much better. No recurrence.

## 2013-10-20 NOTE — Assessment & Plan Note (Signed)
No evidence of fluid retention on this assessment today. Continue current regimen.

## 2013-10-20 NOTE — Progress Notes (Signed)
HPI: [name] is a 68 year old patient of Dr. Diona [name] we are following for ongoing assessment and management of CAD, paroxysmal atrial fibrillation, hypertension, hyperlipidemia, and CHF. She has chronic left-sided chest pain, is being treated at home for COPD with O2. The patient was seen on consultation in March of 2015 with recurrent complaints of palpitations, recurrent chest pain.  The patient had a cardiac catheterization in 2008 which did not reveal any significant coronary artery disease. The pain that she experienced was found to be atypical, that is reproducible and similar in character to a long history of chest pain although increased severity since starting steroids. Her Lopressor was changed to Toprol XL 150 mg daily for systolic dysfunction, and add more potent I anginal affect, with palpitation.  She is stable from a cardiac standpoint. Still has chest discomfort under her left breast, but no significant chest pain. She is happy with the new dose of metoprolol as this is helping her with her palpitations.      Allergies  Allergen Reactions  . Prednisone     Can have in small doses  . Codeine Nausea And Vomiting and Rash    Current Outpatient Prescriptions  Medication Sig Dispense Refill  . acetaminophen (TYLENOL) 325 MG tablet Take 650 mg by mouth every 6 (six) hours as needed for mild pain.       [name] ALPRAZolam (XANAX) 1 MG tablet Take 1 mg by mouth 3 (three) times daily as needed for anxiety.       . cholecalciferol (VITAMIN D) 1000 UNITS tablet Take 1,000 Units by mouth daily.      . citalopram (CELEXA) 20 MG tablet Take 20 mg by mouth 2 (two) times daily.       . clotrimazole-betamethasone (LOTRISONE) cream Apply 1 application topically 2 (two) times daily. Apply to ear      . dexamethasone (DECADRON) 4 MG tablet Take 3 tablets (12 mg total) by mouth Nightly.  90 tablet  3  . fenofibrate 160 MG tablet Take 160 mg by mouth at bedtime.      . furosemide (LASIX) 80 MG  tablet Take 40 mg by mouth daily.       [name] levothyroxine (SYNTHROID, LEVOTHROID) 100 MCG tablet Take 100 mcg by mouth daily.        . Linaclotide (LINZESS) 145 MCG CAPS capsule Take 145 mcg by mouth daily.      [name] lisinopril (PRINIVIL,ZESTRIL) 2.5 MG tablet Take 1 tablet (2.5 mg total) by mouth daily.  30 tablet  5  . metFORMIN (GLUCOPHAGE) 1000 MG tablet Take 1,000 mg by mouth 2 (two) times daily with a meal.       . metoprolol succinate (TOPROL-XL) 50 MG 24 hr tablet Take 75 mg by mouth daily. Take with or immediately following a meal.      . morphine (MS CONTIN) 30 MG 12 hr tablet Take 30 mg by mouth 2 (two) times daily. To treat pain associated with 3 bulging disc in back      . nitroGLYCERIN (NITROSTAT) 0.4 MG SL tablet Place 0.4 mg under the tongue every 5 (five) minutes as needed for chest pain.       . pantoprazole (PROTONIX) 40 MG tablet Take 40 mg by mouth 2 (two) times daily.      . Potassium Chloride Crys CR (KLOR-CON M20 PO) Take 20 mEq by mouth daily.       . vitamin B-12 (CYANOCOBALAMIN) 1000 MCG tablet Take 1,000 mcg by  mouth daily.      [name]. zolpidem (AMBIEN) 10 MG tablet Take 10 mg by mouth at bedtime as needed for sleep.       No current facility-administered medications for this visit.    Past Medical History  Diagnosis Date  . Type 2 diabetes mellitus   . Arthritis   . COPD (chronic obstructive pulmonary disease)     Home oxygen  . Palpitations     Reports history of atrial fibrillation, although none seen recently  . Coronary atherosclerosis of native coronary artery     Nonobstructive at catheterization 2008  . GERD (gastroesophageal reflux disease)   . Irritable bowel syndrome   . Chronic anemia   . Hypothyroidism   . Hyperlipidemia   . Chronic edema   . Pneumonia   . Diabetes mellitus   . Hypertension   . Chronic pain   . Anemia 08/04/2011  . Anemia of chronic disease 08/04/2011  . Bulging lumbar disc     3  . Headache(784.0) 07/30/2013  . Sprain of neck  07/30/2013  . Asthma   . 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 Renal insufficiency 08/07/2011  . Claustrophobia   . Temporal arteritis 10/14/2013    Past Surgical History  Procedure Laterality Date  . Cholecystectomy    . Appendectomy    . Foot surgery    . Abdominal hysterectomy    . Cataract extraction w/phaco Left 09/01/2013    Procedure: CATARACT EXTRACTION PHACO AND INTRAOCULAR LENS PLACEMENT (IOC);  Surgeon: Gemma PayorKerry Hunt, MD;  Location: [facility];  Service: Ophthalmology;  Laterality: Left;  CDE:  14.13  . Artery biopsy Left 09/22/2013    Procedure: BIOPSY TEMPORAL ARTERY;  Surgeon: Mariella SaaBenjamin T Hoxworth, MD;  Location: [facility];  Service: General;  Laterality: Left;    ROS: Review of systems complete and found to be negative unless listed above  PHYSICAL EXAM BP 101/51  Pulse 78  Ht 5\' 6"  (1.676 m)  Wt 142 lb (64.411 kg)  BMI 22.93 kg/m2  SpO2 97%  General: Well developed, well nourished, in no acute distress Head: Eyes PERRLA, No xanthomas.   Normal cephalic and atramatic  Lungs: Clear bilaterally to auscultation and percussion. Some pain with inspiration.  Heart: HRRR S1 S2, without MRG.  Pulses are 2+ & equal.            No carotid bruit. No JVD.  No abdominal bruits. No femoral bruits. Abdomen: Bowel sounds are positive, abdomen soft and non-tender without masses or                  Hernia's noted. Msk:  Back normal, slow gait. Normal strength and tone for age. Extremities: No clubbing, cyanosis or edema.  DP +1 Neuro: Alert and oriented X 3. Psych:  Flat affect, responds appropriately    ASSESSMENT AND PLAN

## 2013-10-20 NOTE — Patient Instructions (Signed)
Your physician recommends that you schedule a follow-up appointment in: 6 months with Dr McDowell You will receive a reminder letter two months in advance reminding you to call and schedule your appointment. If you don't receive this letter, please contact our office.  Your physician recommends that you continue on your current medications as directed. Please refer to the Current Medication list given to you today.   

## 2013-10-20 NOTE — Assessment & Plan Note (Signed)
EF of 40% on last evaluation in January of 2015. Will have follow up appt with Dr Diona BrownerMcDowell and decision will be made to repeat with higher dose of BB, in 6 months.

## 2013-10-20 NOTE — Assessment & Plan Note (Signed)
No further complaints of palpitations or cardiac chest pain. She will continue metoprolol dose as directed with the higher dose prescribed in the hospital. She is tolerating it well.  No changes. She will see Dr. Diona BrownerMcDowell on follow up in 6 months.

## 2013-10-20 NOTE — Progress Notes (Deleted)
Name: Kara [name]    DOB: 1945-08-05  Age: 68 y.o.  MR#: [id number]       PCP:  [name], MD      Insurance: Payor: MEDICARE / Plan: MEDICARE PART A AND B / Product Type: *No Product type* /   CC:    Chief Complaint  Patient presents with  . Coronary Artery Disease  . Atrial Fibrillation    VS Filed Vitals:   10/20/13 1450  BP: 101/51  Pulse: 78  Height: 5\' 6"  (1.676 m)  Weight: 142 lb (64.411 kg)  SpO2: 97%    Weights Current Weight  10/20/13 142 lb (64.411 kg)  10/14/13 142 lb (64.411 kg)  10/05/13 139 lb 9.6 oz (63.322 kg)    Blood Pressure  BP Readings from Last 3 Encounters:  10/20/13 101/51  10/14/13 94/56  10/05/13 130/62     Admit date:  (Not on file) Last encounter with RMR:  Visit date not found   Allergy Prednisone and Codeine  Current Outpatient Prescriptions  Medication Sig Dispense Refill  . acetaminophen (TYLENOL) 325 MG tablet Take 650 mg by mouth every 6 (six) hours as needed for mild pain.       [name] ALPRAZolam (XANAX) 1 MG tablet Take 1 mg by mouth 3 (three) times daily as needed for anxiety.       . cholecalciferol (VITAMIN D) 1000 UNITS tablet Take 1,000 Units by mouth daily.      . citalopram (CELEXA) 20 MG tablet Take 20 mg by mouth 2 (two) times daily.       . clotrimazole-betamethasone (LOTRISONE) cream Apply 1 application topically 2 (two) times daily. Apply to ear      . dexamethasone (DECADRON) 4 MG tablet Take 3 tablets (12 mg total) by mouth Nightly.  90 tablet  3  . fenofibrate 160 MG tablet Take 160 mg by mouth at bedtime.      . furosemide (LASIX) 80 MG tablet Take 40 mg by mouth daily.       [name] levothyroxine (SYNTHROID, LEVOTHROID) 100 MCG tablet Take 100 mcg by mouth daily.        . Linaclotide (LINZESS) 145 MCG CAPS capsule Take 145 mcg by mouth daily.      [name] lisinopril (PRINIVIL,ZESTRIL) 2.5 MG tablet Take 1 tablet (2.5 mg total) by mouth daily.  30 tablet  5  . metFORMIN (GLUCOPHAGE) 1000 MG tablet Take 1,000 mg by mouth  2 (two) times daily with a meal.       . metoprolol succinate (TOPROL-XL) 50 MG 24 hr tablet Take 75 mg by mouth daily. Take with or immediately following a meal.      . morphine (MS CONTIN) 30 MG 12 hr tablet Take 30 mg by mouth 2 (two) times daily. To treat pain associated with 3 bulging disc in back      . nitroGLYCERIN (NITROSTAT) 0.4 MG SL tablet Place 0.4 mg under the tongue every 5 (five) minutes as needed for chest pain.       . pantoprazole (PROTONIX) 40 MG tablet Take 40 mg by mouth 2 (two) times daily.      . Potassium Chloride Crys CR (KLOR-CON M20 PO) Take 20 mEq by mouth daily.       . vitamin B-12 (CYANOCOBALAMIN) 1000 MCG tablet Take 1,000 mcg by mouth daily.      [name] zolpidem (AMBIEN) 10 MG tablet Take 10 mg by mouth at bedtime as needed for sleep.  No current facility-administered medications for this visit.    Discontinued Meds:    Medications Discontinued During This Encounter  Medication Reason  . metoprolol succinate (TOPROL-XL) 50 MG 24 hr tablet     Patient Active Problem List   Diagnosis Date Noted  . Temporal arteritis 10/14/2013  . Chest pain 10/02/2013  . 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 Systolic dysfunction 08/22/2013  . Allergic reaction 08/20/2013  . Elevated troponin 08/20/2013  . Atrial flutter 08/20/2013  . Dehydration 08/18/2013  . Headache(784.0) 07/30/2013  . Sprain of neck 07/30/2013  . Concussion with no loss of consciousness 07/30/2013  . Loss of weight 12/02/2012  . N&V (nausea and vomiting) 10/21/2012  . GERD (gastroesophageal reflux disease) 10/21/2012  . Renal insufficiency 08/07/2011  . Anemia of chronic disease 08/04/2011  . Chronic diastolic heart failure 07/10/2011  . Lower extremity edema 04/13/2011  . COPD (chronic obstructive pulmonary disease) 04/13/2011  . Essential hypertension, benign 04/13/2011  . Coronary atherosclerosis of native coronary artery 04/13/2011  . Hypothyroidism 04/13/2011  . Palpitations 04/13/2011     LABS    Component Value Date/Time   NA 137 10/03/2013 0206   NA 138 10/02/2013 1257   NA 143 09/22/2013 0838   K 4.5 10/03/2013 0206   K 4.4 10/02/2013 1257   K 4.6 09/22/2013 0838   CL 100 10/03/2013 0206   CL 100 10/02/2013 1257   CL 98 09/22/2013 0838   CO2 26 10/03/2013 0206   CO2 27 10/02/2013 1257   CO2 36* 09/22/2013 0838   GLUCOSE 120* 10/03/2013 0206   GLUCOSE 127* 10/02/2013 1257   GLUCOSE 91 09/22/2013 0838   BUN 33* 10/03/2013 0206   BUN 38* 10/02/2013 1257   BUN 36* 09/22/2013 0838   CREATININE 1.17* 10/03/2013 0206   CREATININE 1.34* 10/02/2013 1257   CREATININE 1.20* 09/22/2013 0838   CALCIUM 9.3 10/03/2013 0206   CALCIUM 9.3 10/02/2013 1257   CALCIUM 9.8 09/22/2013 0838   GFRNONAA 47* 10/03/2013 0206   GFRNONAA 40* 10/02/2013 1257   GFRNONAA 46* 09/22/2013 0838   GFRAA 55* 10/03/2013 0206   GFRAA 46* 10/02/2013 1257   GFRAA 53* 09/22/2013 0838   CMP     Component Value Date/Time   NA 137 10/03/2013 0206   K 4.5 10/03/2013 0206   CL 100 10/03/2013 0206   CO2 26 10/03/2013 0206   GLUCOSE 120* 10/03/2013 0206   BUN 33* 10/03/2013 0206   CREATININE 1.17* 10/03/2013 0206   CALCIUM 9.3 10/03/2013 0206   PROT 6.6 08/19/2013 0544   ALBUMIN 3.0* 08/19/2013 0544   AST 13 08/19/2013 0544   ALT 5 08/19/2013 0544   ALKPHOS 57 08/19/2013 0544   BILITOT 0.5 08/19/2013 0544   GFRNONAA 47* 10/03/2013 0206   GFRAA 55* 10/03/2013 0206       Component Value Date/Time   WBC 5.9 10/03/2013 0206   WBC 6.5 10/02/2013 1257   WBC 8.7 09/22/2013 0838   HGB 10.3* 10/03/2013 0206   HGB 10.1* 10/02/2013 1257   HGB 9.6* 09/22/2013 0838   HCT 32.3* 10/03/2013 0206   HCT 32.7* 10/02/2013 1257   HCT 31.8* 09/22/2013 0838   MCV 91.8 10/03/2013 0206   MCV 93.2 10/02/2013 1257   MCV 97.2 09/22/2013 0838    Lipid Panel  No results found for this basename: chol, trig, hdl, cholhdl, vldl, ldlcalc    ABG    Component Value Date/Time   PHART 7.385 08/24/2012 1400   PCO2ART 62.5* 08/24/2012 1400  PO2ART 84.5 08/24/2012 1400   HCO3  36.6* 08/24/2012 1400   TCO2 34.9 08/24/2012 1400   O2SAT 98.9 08/24/2012 1400     Lab Results  Component Value Date   TSH 1.249 08/19/2013   BNP (last 3 results)  Recent Labs  10/02/13 1257  PROBNP 4681.0*   Cardiac Panel (last 3 results) No results found for this basename: CKTOTAL, CKMB, TROPONINI, RELINDX,  in the last 72 hours  Iron/TIBC/Ferritin    Component Value Date/Time   IRON 70 12/27/2012 1449   TIBC 386 12/27/2012 1449   FERRITIN 465* 06/09/2013 1425     EKG Orders placed during the hospital encounter of 10/02/13  . EKG 12-LEAD  . EKG 12-LEAD  . EKG     Prior Assessment and Plan Problem List as of 10/20/2013     Cardiovascular and Mediastinum   Temporal arteritis   Essential hypertension, benign   Last Assessment & Plan   09/13/2011 Office Visit Written 09/13/2011  3:46 PM by Jonelle Sidle, MD     Blood pressure well controlled today.    Coronary atherosclerosis of native coronary artery   Last Assessment & Plan   09/13/2011 Office Visit Written 09/13/2011  3:46 PM by Jonelle Sidle, MD     Nonobstructive per prior assessment, no active angina.    Chronic diastolic heart failure   Last Assessment & Plan   09/13/2011 Office Visit Written 09/13/2011  3:45 PM by Jonelle Sidle, MD     Stable weight with much improved edema on current diuretic regimen, stable creatinine and potassium. No changes made today. Followup arranged.    Atrial flutter     Respiratory   COPD (chronic obstructive pulmonary disease)   Last Assessment & Plan   07/10/2011 Office Visit Written 07/10/2011  3:46 PM by Jonelle Sidle, MD     Oxygen requiring, also likely impacts her feelings of shortness of breath.      Digestive   N&V (nausea and vomiting)   GERD (gastroesophageal reflux disease)     Endocrine   Hypothyroidism   Last Assessment & Plan   04/13/2011 Office Visit Written 04/13/2011  4:15 PM by Jonelle Sidle, MD     Need to followup TSH.      Nervous and  Auditory   Concussion with no loss of consciousness     Musculoskeletal and Integument   Sprain of neck     Genitourinary   Renal insufficiency   Last Assessment & Plan   09/13/2011 Office Visit Written 09/13/2011  3:46 PM by Jonelle Sidle, MD     Stable, most recent creatinine 1.4. Followup [facility] for next visit.      Other   Headache(784.0)   Visual loss, left eye   Lower extremity edema   Last Assessment & Plan   07/10/2011 Office Visit Written 07/10/2011  3:47 PM by Jonelle Sidle, MD     Much improved.    Palpitations   Last Assessment & Plan   05/10/2011 Office Visit Written 05/10/2011  3:34 PM by Jonelle Sidle, MD     Patient reports atrial fibrillation by history, although this has not been seen recently. Heart rate is in the 70s in sinus rhythm on examination. Continue beta blocker therapy. Will not plan to resume diltiazem at this point. Continue to observe.    Anemia of chronic disease   Loss of weight   Dehydration   Allergic reaction   Elevated troponin  Systolic dysfunction   Chest pain       Imaging: Dg Chest 2 View  09/22/2013   CLINICAL DATA:  Hypertension; preoperative evaluation  EXAM: CHEST  2 VIEW  COMPARISON:  Chest radiograph January 30, 2013 and chest CT August 22, 2013  FINDINGS: There is a degree of underlying emphysematous change. There is no edema or consolidation. The heart size is normal. The pulmonary vascularity reflects underlying emphysema. Prominence in the ascending thoracic aortic region appear stable. No adenopathy. No bone lesions.  IMPRESSION: Prominence of the ascending thoracic aorta remains without change. Underlying emphysema. No edema or consolidation.   Electronically Signed   By: Bretta BangWilliam  Woodruff M.D.   On: 09/22/2013 08:41   Ct Head Wo Contrast  10/02/2013   CLINICAL DATA:  Altered mental status  EXAM: CT HEAD WITHOUT CONTRAST  TECHNIQUE: Contiguous axial images were obtained from the base of the skull through the vertex  without intravenous contrast. Study was obtained within 24 hr of patient's arrival at the emergency department.  COMPARISON:  August 18, 2013  FINDINGS: There is age related volume loss, stable. There is no demonstrable mass, hemorrhage, extra-axial fluid collection, or midline shift. There is minimal small vessel disease in the centra semiovale bilaterally. Elsewhere, gray-white compartments appear normal. There is no demonstrable acute infarct. There is a focal defect in the superior posterior left frontal bone consistent with a burr hole. Bony calvarium otherwise appears intact. The mastoid air cells are clear. Orbits appear symmetric and normal bilaterally.  IMPRESSION: Minimal periventricular small vessel disease. No intracranial mass, hemorrhage, or acute infarct. Postoperative defect left frontal bone.   Electronically Signed   By: Bretta BangWilliam  Woodruff M.D.   On: 10/02/2013 15:02   Dg Chest Portable 1 View  10/02/2013   CLINICAL DATA:  Chest pain  EXAM: PORTABLE CHEST - 1 VIEW  COMPARISON:  September 22, 2013  FINDINGS: There is a degree of underlying emphysematous change. There is no edema or consolidation. Heart is mildly enlarged with normal pulmonary vascularity. No adenopathy. No bone lesions.  IMPRESSION: Mild cardiac enlargement. Underlying emphysematous change. No edema or consolidation.   Electronically Signed   By: Bretta BangWilliam  Woodruff M.D.   On: 10/02/2013 13:07

## 2013-10-31 ENCOUNTER — Telehealth: Payer: Self-pay | Admitting: Neurology

## 2013-10-31 NOTE — Telephone Encounter (Signed)
I called the patient. The patient is having some side effects on the Decadron. The patient is on 12 mg daily, we can cut back to 2 half tablets, taking 10 mg daily. The patient should not cut back any further. The patient has Xanax and Ambien to take at night if needed for sleep.Tylenol is okay to take.

## 2013-10-31 NOTE — Telephone Encounter (Signed)
Patient calling with several concerns--states that she is still having severe side effects while taking the steroid Decadron. Patient states that she does not have any severe pressure anymore near her left eye and she is able to touch it without being in pain. Patient states every now and then she will get a small headache that lasts a few seconds. Patient reports fuzziness when she looks at anything up close with her right eye and has some pain there--wants to know if it is normal. Patient states she is taking her Decadron every morning between 11:30 and 12:30 and wants to know if she is taking them too early in the day. Patient also reports that she is not getting enough rest and believes it to be due to the Decadron. Patient also wants to know if it is okay that she took a Tylenol one day. Patient also wants to note that she is still taking Ambien. Please call and advise patient, states that it is okay to leave a detailed message in her voicemail.

## 2013-10-31 NOTE — Telephone Encounter (Signed)
Patient calling with several concerns--states that she is still having severe side effects while taking the steroid Decadron. Patient states that she does not have any severe pressure anymore near her left eye and she is able to touch it without being in pain. Patient states every now and then she will get a small headache that lasts a few seconds. Patient reports fuzziness when she looks at anything up close with her right eye and has some pain there--wants to know if it is normal. Patient states she is taking her Decadron every morning between 11:30 and 12:30 and wants to know if she is taking them too early in the day. Patient also reports that she is not getting enough rest and believes it to be due to the Decadron. Patient also wants to know if it is okay that she took a Tylenol one day. Patient also wants to note that she is still taking Ambien. Please call and advise patient, states that it is okay to leave a detailed message in her voicemail.  °

## 2013-11-07 ENCOUNTER — Ambulatory Visit: Payer: Medicare Other | Admitting: Nurse Practitioner

## 2013-11-07 ENCOUNTER — Telehealth: Payer: Self-pay | Admitting: Neurology

## 2013-11-07 NOTE — Telephone Encounter (Signed)
Patient called to ask whether it is safe for her to have cataract surgery in her right eye (the eye she is not blind in). Patient states she discussed with her eye doctor who believes cataract surgery will help with the fuzziness in her right eye when she tries to view things up close. Patient states that she does not get any more terrible headaches, she only has ordinary headaches every now and then. Patient wants to know if it is okay to take 1 Tylenol every now and then. Patient states she is still not sleeping well and is taking Ambien. Patient also has some concerns about her steroid medication. Patient states it is ok to leave message, please call and advise.

## 2013-11-07 NOTE — Telephone Encounter (Signed)
Pt is calling stating that her eye doctor advised her to have cataract surgery on her right eye and pt wanted to know if it was safe and also has questions concerning her steroid medications. Please advise

## 2013-11-07 NOTE — Telephone Encounter (Signed)
I called patient. The patient is on relatively high doses of the Decadron, taking 10 mg daily. I would recommend delaying cataract surgery for 4-6 months. As you get down to a lower dose of the Decadron, she may have cataract surgery in the future.

## 2013-11-10 ENCOUNTER — Telehealth: Payer: Self-pay | Admitting: Neurology

## 2013-11-10 NOTE — Telephone Encounter (Addendum)
I called patient. The patient has gotten glasses, okay to wear the glasses. The patient does get an occasional headache, okay to take Tylenol for this. I discussed these issues with her.

## 2013-11-10 NOTE — Telephone Encounter (Signed)
Per EMR patient is to call you with any changes with her vision, patient was seen at Dr. Celene Skeenarter's office she was given eye glasses for distance, patient is requesting approval for a stronger Rx strength for the right eye, left eye has no Rx, patient also states that she has been having headache daily, she wants to know if it is okay for her to take tylenol. Patient is requesting a call back and states if no answer a message can be left on her voice mail.

## 2013-11-10 NOTE — Telephone Encounter (Signed)
Patient called to state that her eye doctor fitted her with glasses last week. The left side (her blind side) is a clear piece of glass with no prescription. The right side has a prescription for distance which patient states has helped but she wants it to be slightly stronger upon [name]' approval. Patient wants to know if it's okay for her to continue wearing these glasses. Patient states that the glasses do not help with her right eye fuzziness, which comes and goes. Patient also wondering if it is okay to take a Tylenol every now and then for her everyday headaches. Patient also states she will go back to taking her steroids later in the morning with her regular morning medication. Patient states that she is still taking Ambien due to having difficulty sleeping. Please call and advise patient.

## 2013-11-17 ENCOUNTER — Telehealth: Payer: Self-pay | Admitting: Adult Health

## 2013-11-17 ENCOUNTER — Other Ambulatory Visit: Payer: Self-pay

## 2013-11-17 NOTE — Telephone Encounter (Signed)
Please call patient regarding A-Fib / tgs

## 2013-11-17 NOTE — Telephone Encounter (Signed)
Clarified that she just be taking toprol XL 50 mg 1 1/2 tabs qd

## 2013-11-19 ENCOUNTER — Telehealth: Payer: Self-pay | Admitting: *Deleted

## 2013-11-19 NOTE — Telephone Encounter (Signed)
Kara [name], called pt [facility]  She stated she couldn't come at 11:00 due meds taken at that time has side effects.  Please advise.

## 2013-11-19 NOTE — Telephone Encounter (Signed)
Information was [place] to [place]

## 2013-11-19 NOTE — Telephone Encounter (Signed)
I called several times this afternoon, left messages. I will call back tomorrow.

## 2013-11-19 NOTE — Telephone Encounter (Signed)
Spoke with Kara [name] in regards to rescheduling her [facility] Her appointment has been rescheduled. She wanted [name] to know that she has been having some blurred vision in her right eye. She has not had any pain, this is not ongoing more off and on. She is just concerned due to recent blindness in left eye. Please give her a call.

## 2013-11-20 NOTE — Telephone Encounter (Signed)
I called patient. The patient is having some intermittent blurring of vision with the right eye. The patient has had variations in blood sugars going almost to 200, this could be a factor along with the cataract. We will have another sedimentation rate checked. The patient indicates that she will be seeing her primary care doctor soon, and she wants to know if this blood work can be done when she sees her doctor. If he is willing to do this, this would be okay with me.

## 2013-11-24 ENCOUNTER — Telehealth: Payer: Self-pay | Admitting: *Deleted

## 2013-11-24 NOTE — Telephone Encounter (Signed)
Patient calling wanting to know if Dr Willis was familiar with Dr Tim Martin, Opthamologist. Questioning if Dr Willis felt this doctor could help her with her eye condition. Please call and advise. Thanks   

## 2013-11-24 NOTE — Telephone Encounter (Signed)
I called patient. The patient reports some blurring of vision in the right eye, I believe that this is related to her cataracts. Dr. Theone Murdochim Martin is a very good neuro ophthalmologist, but I do not believe that he has anything to offer this patient over and above what we are doing at this time.

## 2013-11-24 NOTE — Telephone Encounter (Signed)
Patient calling wanting to know if Dr Anne HahnWillis was familiar with Dr Theone Murdochim Martin, Opthamologist. Questioning if Dr Anne HahnWillis felt this doctor could help her with her eye condition. Please call and advise. Thanks

## 2013-11-24 NOTE — Telephone Encounter (Signed)
Patient calling regarding BW order for PCP to draw serotonin hadn't been received.  She has appointment tomorrow and spoke with Dr Anne HahnWillis on Friday regarding BW.  She was informed that order would be sent over.  Thanks

## 2013-11-25 ENCOUNTER — Ambulatory Visit: Payer: Medicare Other | Admitting: Cardiology

## 2013-11-25 ENCOUNTER — Ambulatory Visit (INDEPENDENT_AMBULATORY_CARE_PROVIDER_SITE_OTHER): Payer: Medicare Other | Admitting: Internal Medicine

## 2013-11-26 ENCOUNTER — Telehealth: Payer: Self-pay | Admitting: *Deleted

## 2013-11-26 MED ORDER — METOPROLOL SUCCINATE ER 50 MG PO TB24: ORAL_TABLET | ORAL | Status: DC

## 2013-11-26 NOTE — Telephone Encounter (Signed)
PT NEEDS NEW INSTRUCTIONS CALLED IN FOR METOPROLOL, SHE TAKE 75 MG DAILY. LAST RX WAS CALLED IN FOR 50 MG ONCE A DAY

## 2013-11-26 NOTE — Telephone Encounter (Signed)
Medication sent via escribe.  

## 2013-11-28 ENCOUNTER — Other Ambulatory Visit: Payer: Self-pay | Admitting: Neurology

## 2013-11-28 ENCOUNTER — Telehealth: Payer: Self-pay | Admitting: *Deleted

## 2013-11-28 MED ORDER — ZOLPIDEM TARTRATE 10 MG PO TABS: 10.0000 mg | ORAL_TABLET | Freq: Every evening | ORAL | Status: DC | PRN

## 2013-11-28 NOTE — Telephone Encounter (Signed)
Rx signed and faxed.

## 2013-11-28 NOTE — Telephone Encounter (Signed)
Patient's caregiver calling to get a refill on Ambien - only has 1 for tonight but [facility] [phone].

## 2013-11-28 NOTE — Telephone Encounter (Signed)
All requested info sent to Ins for [employer].  Pending response.   Refill request forwarded to provider for approval.

## 2013-11-28 NOTE — Telephone Encounter (Signed)
Kara [name] with [employer] calling to get authorization for zolpidem (AMBIEN) 10 MG tablet.  Please call and advise.

## 2013-11-28 NOTE — Telephone Encounter (Signed)
Called back, they have faxed a form for completion.  I completed this form and faxed it back to them at [phone].  Ref # [id number].  They will notify both us and the patient of the outcome once a decision has been made.

## 2013-12-01 ENCOUNTER — Telehealth: Payer: Self-pay | Admitting: Neurology

## 2013-12-01 NOTE — Telephone Encounter (Signed)
Blood work done on May 6 shows a sedimentation rate of 7, TSH 4.6, hemoglobin A1c of 5.8. Hemoglobin is 8.6, white count is 9.8. Hemoglobin appears to be dropping slowly. The dexamethasone could be dropped to 8 mg daily. The patient will be going on iron supplementation and an increased thyroid medication dose.

## 2013-12-05 ENCOUNTER — Telehealth: Payer: Self-pay | Admitting: *Deleted

## 2013-12-05 NOTE — Telephone Encounter (Signed)
Thank you. I agree with keeping her on the current dose of metoprolol at 75 mg. Lisinopril can be held for a few days to see what BP is doing, but will defer to Dr. [name] as he started her on this.

## 2013-12-05 NOTE — Telephone Encounter (Signed)
Pt wants to talk with someone about cutting back on her medications (she did not know the name of the medication) she fells that since she has not been having any problems maybe she can cut back. She also needs to talk with someone about her BP.

## 2013-12-05 NOTE — Telephone Encounter (Signed)
Pt is on metoprolol succinate 75 mg. Heart rate on the 75 mg is between 60-80 BMP. Pt states can she get off of the 1/2 of pill and stay on 50 mg. I asked the pt what her heart rate was while on the 50mg , she states it went up to 100 BMP. I recommened pt to stay on 75 mg, that's where we want her heart rate at. She was fine with that, she states she will stay on it.  Also pt mentioned Dr [name] put her on Lisinopril 2.5 and her BP is running low at 94/47, BP usually runs 120/80. [facility] called [facility] back. I advised the pt to talk with Dr [name] about the Lisinopril since she already called his office.

## 2013-12-09 ENCOUNTER — Telehealth: Payer: Self-pay | Admitting: Neurology

## 2013-12-09 NOTE — Telephone Encounter (Signed)
States her right eye is having blurred vision and she wants the Dr. To call her. States she gets normal headaches and take tylenol.

## 2013-12-10 MED ORDER — ZOLPIDEM TARTRATE ER 12.5 MG PO TBCR: 12.5000 mg | EXTENDED_RELEASE_TABLET | Freq: Every evening | ORAL | Status: DC | PRN

## 2013-12-10 NOTE — Telephone Encounter (Signed)
I had a long discussion with the patient on the telephone. There appears that there really isn't anything new with her headaches or vision. The patient mainly is concerned about her ability to sleep on the dexamethasone. The patient is able to get to sleep, but she only stays asleep for about 3 hours. I will try Ambien CR. We will check another sedimentation rate on her revisit in June.

## 2013-12-18 ENCOUNTER — Telehealth: Payer: Self-pay | Admitting: Neurology

## 2013-12-18 NOTE — Telephone Encounter (Signed)
Events noted, I did not call the patient. 

## 2013-12-18 NOTE — Telephone Encounter (Signed)
Has a caregiver and is going to social services to attempt to get another one that she can afford wants the provider to know

## 2013-12-18 NOTE — Telephone Encounter (Signed)
Spoke with pt and pt stated that she just wanted to make Dr. Anne Hahn aware that she has a caregiver now, but is going to Social Service to apply for another one because she found out that she was eligible to get one for free of charge. I advise that pt that if she has any other problems, questions or concerns to call the office. Pt verbalized understanding. FYI

## 2013-12-24 ENCOUNTER — Other Ambulatory Visit (INDEPENDENT_AMBULATORY_CARE_PROVIDER_SITE_OTHER): Payer: Self-pay | Admitting: Internal Medicine

## 2013-12-25 NOTE — Telephone Encounter (Signed)
Per Dr.Rehman may fill with 5 additional refills. 

## 2013-12-29 ENCOUNTER — Telehealth: Payer: Self-pay | Admitting: Neurology

## 2013-12-29 NOTE — Telephone Encounter (Signed)
Patient called and stated her R eye is blurry, and she's having more difficulty seeing out of eye.  She uses a flashlight to help see things up close.  She has appoinmentt on 6/24 and feels she needs an earlier appointment.  She complaining of feeling very fatigued.  She stated her Blood pressure is running very low 84/48 and pulse in the 50's.  Please call and advise

## 2013-12-29 NOTE — Telephone Encounter (Signed)
I called patient. The patient is having ongoing issues with blurring of the right eye that has been present over the last 6 weeks or so. There has been no change in this. The patient is noting some occasional headaches. We will recheck a sedimentation rate, I'll have the patient worked in within the next week or 2.

## 2013-12-29 NOTE — Telephone Encounter (Signed)
Kara [name] is having difficulty with blurred vision in her right eye. She has appointment on 6/24 and wants to know if she can be seen sooner.

## 2013-12-31 ENCOUNTER — Encounter: Payer: Self-pay | Admitting: Neurology

## 2013-12-31 ENCOUNTER — Ambulatory Visit (INDEPENDENT_AMBULATORY_CARE_PROVIDER_SITE_OTHER): Payer: Medicare HMO | Admitting: Neurology

## 2013-12-31 VITALS — BP 112/51 | HR 73 | Wt 153.0 lb

## 2013-12-31 DIAGNOSIS — H5462 Unqualified visual loss, left eye, normal vision right eye: Secondary | ICD-10-CM

## 2013-12-31 DIAGNOSIS — Z5181 Encounter for therapeutic drug level monitoring: Secondary | ICD-10-CM

## 2013-12-31 DIAGNOSIS — I2510 Atherosclerotic heart disease of native coronary artery without angina pectoris: Secondary | ICD-10-CM

## 2013-12-31 DIAGNOSIS — H5460 Unqualified visual loss, one eye, unspecified: Secondary | ICD-10-CM

## 2013-12-31 DIAGNOSIS — M316 Other giant cell arteritis: Secondary | ICD-10-CM

## 2013-12-31 NOTE — Patient Instructions (Signed)
Temporal Arteritis Arteries are blood vessels that carry blood from the heart to all parts of the body. Temporal arteritis is a swelling (inflammation) of certain large arteries. This usually affects arteries in the head and neck area, including arteries in the area on the side of the head, between the ears and eyes (temples). The condition can be very painful. It also can cause serious problems, even blindness. Early diagnosis and treatment is very important. CAUSES  Temporal arteritis results from the body reacting to injury or infection (inflammation). This may occur when the body's immune system (which fights germs and disease) makes a mistake. It attacks its own arteries. No one knows why this happens. However, certain things (risk factors) make it more likely that a person will develop temporal arteritis. They include:  Age. Most people with temporal arteritis are older than 50. The average age is 70.  Sex. Three times more women than men develop the condition.  Race and ethnic background. Caucasians are more likely to have temporal arteritis than other races. So are people whose families came from [place], [place] or [place]  Having polymyalgia rheumatica (PMR). This condition causes stiffness and pain in the joints of the neck, shoulders and hips. About 15% of people with PMR also have temporal arteritis. SYMPTOMS  Not everyone with temporal arteritis has the same symptoms. Some people have just one symptom. Others may have several. The most common symptom is a new headache, often in the temple region. Symptoms may show up in other parts of the body too.   Symptoms affecting the head may include:  Temporal arteries that feel hard or swollen. It may hurt when the temples are touched.  Pain when combing your hair, or when laying your head on a pillow.  Pain in the jaw when chewing.  Pain in the throat or tongue.  Visual problems, including sudden loss of  vision in one eye, or seeing double.  Symptoms in other parts of the body may include:  Fever.  Fatigue.  A dry cough.  Pain in the hips and shoulders.  Pain in the arms during exercise.  Depression.  Weight loss. DIAGNOSIS  Symptoms of temporal arteritis are similar to symptoms for other conditions. This can make it hard to tell if you have the condition. To be sure, your caregiver will ask about your symptoms and do a physical exam. Certain tests may be necessary, such as:   An exam of your temples. Often, the temporal arteries will be swollen and hard. This can be felt.  A complete blood count. This test shows how many red blood cells are in your blood. Most people with temporal arteritis do not have enough red blood cells (anemic).  Erythrocyte sedimentation (also called sed rate test). It measures inflammation in the body. Almost everyone with temporal arteritis has a high sed rate.  C reactive protein test. This also shows if there is inflammation.  Biopsy a temporal artery. This means the caregiver will take out a small piece of an artery. Then, it is checked under a microscope for inflammation. More than one biopsy may be needed. That is because inflammation can be in one part of an artery and not in others. Your caregiver may need to check more than one spot. TREATMENT  Starting treatment right away is very important. Often, you will need to see a specialist in immunologic diseases (rheumatologist). Goals of treatment include protecting your eyesight. Once vision is gone, it might not come   back. The normal treatment is medication. It usually works well and quickly. Most people start getting better in a few days. Medication options include:  Corticosteroids. These are powerful drugs that fight inflammation. These drugs are most often used to treat temporal arteritis.  Usually, a high dose is taken at first. After symptoms improve, a smaller dose is used. The goal is to take  the smallest dose possible and still control your symptoms. That is because using corticosteroids for a long time can cause problems. They can make muscles and bones weak. They can cause blood pressure to go up, and cause diabetes. Also, people often gain weight when they take corticosteroids. Corticosteroids may need to be taken for one or two years.  Several newer drugs are being tested to treat temporal arteritis. Researchers are testing to see if new drugs will work as well as corticosteroids, but cause fewer problems than them. Testing of these drugs is not yet complete.  Some specialists recommend low dose aspirin to prevent blood clots. HOME CARE INSTRUCTIONS   Take any corticosteroids that your caregiver prescribes. Follow the directions carefully.  Take any vitamins or supplements that your caregiver suggests. This may include vitamin D and calcium. They help keep your bones from becoming weak.  Keep all appointments for checkups. Your caregiver will watch for any problems from the medication. Checkups may include:  Periodic blood tests.  Bone density testing. This checks how strong or weak your bones are.  Blood pressure checks. If your blood pressure rises, you may need to take a drug to control it while you are taking corticosteroids.  Blood sugar checks. This is to be sure you are not developing diabetes. If you have diabetes, corticosteroid medications may make it worse and require increased treatment.  Exercise. First, talk with your caregiver about what would be OK for you to do. Aerobic exercise (which increases your heart rate) is usually suggested. It does not have to require a lot of energy. Walking is aerobic exercise. This type of exercise is good because it helps prevent bone loss. It also helps control your blood pressure.  Follow a healthy diet. The goal is to prevent bone damage and diabetes. Include good sources of protein in your diet. Also, include fruits,  vegetables and whole grains. Your caregiver can refer you to an expert on healthy eating (dietitian) for more advice. SEEK MEDICAL CARE IF:   The symptoms that lead to your diagnosis return.  You develop worsening fever, fatigue, headache, weight loss, or pain in your jaw.  You develop signs of infection. Infections can be worse if you are on corticosteroid medication. SEEK IMMEDIATE MEDICAL CARE IF:   Your eyesight changes.  Pain does not go away, even after taking pain medicine.  You feel pain in your chest.  Breathing is difficult.  One side of your face or body suddenly becomes weak or numb.  You develop a fever of more than 102 F (38.9 C). Document Released: 05/07/2009 Document Revised: 10/02/2011 Document Reviewed: 05/07/2009 Exit[employer] Patient Information 2014 [employer].  

## 2013-12-31 NOTE — Progress Notes (Signed)
Reason for visit: Temporal arteritis  Kara [name] is an 68 y.o. 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 The patient is on Decadron taking 8 mg daily. She has had insomnia, peripheral edema, anxiety, and some muscle weakness on this dose of medication. The patient does have some blurring involving the right eye that is partially corrected with glasses. The patient has a cataract on the right eye that will require surgery, but this has been delayed secondary to the temporal arteritis. The patient indicates that her memory has been poor since the onset of the temporal arteritis. The patient feels weak all over. She has had some problems with hypotension, and she has been taken off of lisinopril one month ago. The patient returns to the office for further evaluation. The patient is having occasional sharp brief headaches that are mainly in the left temporal area lasting a few moments occurring 2 or 3 times a day. These are very different from the headache she was having prior to initiating steroid treatment. She continues to have some neck stiffness.  Past Medical History  Diagnosis Date  . Type 2 diabetes mellitus   . Arthritis   . COPD (chronic obstructive pulmonary disease)     Home oxygen  . Palpitations     Reports history of atrial fibrillation, although none seen recently  . Coronary atherosclerosis of native coronary artery     Nonobstructive at catheterization 2008  . GERD (gastroesophageal reflux disease)   . Irritable bowel syndrome   . Chronic anemia   . Hypothyroidism   . Hyperlipidemia   . Chronic edema   . Pneumonia   . Diabetes mellitus   . Hypertension   . Chronic pain   . Anemia 08/04/2011  . Anemia of chronic disease 08/04/2011  . Bulging lumbar disc     3  . Headache(784.0) 07/30/2013  . Sprain of neck 07/30/2013  . Asthma   . Visual  loss, left eye 09/12/2013  . Renal insufficiency 08/07/2011  . Claustrophobia   . Temporal arteritis 10/14/2013    Past Surgical History  Procedure Laterality Date  . Cholecystectomy    . Appendectomy    . Foot surgery    . Abdominal hysterectomy    . Cataract extraction w/phaco Left 09/01/2013    Procedure: CATARACT EXTRACTION PHACO AND INTRAOCULAR LENS PLACEMENT (IOC);  Surgeon: Gemma Payor, MD;  Location: [facility];  Service: Ophthalmology;  Laterality: Left;  CDE:  14.13  . Artery biopsy Left 09/22/2013    Procedure: BIOPSY TEMPORAL ARTERY;  Surgeon: Mariella Saa, MD;  Location: [facility];  Service: General;  Laterality: Left;    Family History  Problem Relation Age of Onset  . Hypertension    . Diabetes Brother   . Migraines Brother     Social history:  reports that she quit smoking about 18 years ago. Her smoking use included Cigarettes. She has a 48 pack-year smoking history. She has never used smokeless tobacco. She reports that she does not drink alcohol or use illicit drugs.    Allergies  Allergen Reactions  . Prednisone     Can have in small doses  . Codeine Nausea And Vomiting and Rash    Medications:  Current Outpatient Prescriptions on File Prior to Visit  Medication Sig Dispense Refill  . acetaminophen (TYLENOL) 325 MG tablet Take 650 mg by  mouth every 6 (six) hours as needed for mild pain.       [name] ALPRAZolam (XANAX) 1 MG tablet Take 1 mg by mouth 3 (three) times daily as needed for anxiety.       . cholecalciferol (VITAMIN D) 1000 UNITS tablet Take 1,000 Units by mouth daily.      . citalopram (CELEXA) 20 MG tablet Take 20 mg by mouth 2 (two) times daily.       . clotrimazole-betamethasone (LOTRISONE) cream Apply 1 application topically 2 (two) times daily. Apply to ear      . dexamethasone (DECADRON) 4 MG tablet Take 8 mg by mouth daily.      . fenofibrate 160 MG tablet Take 160 mg by mouth at bedtime.      . furosemide (LASIX) 80 MG tablet Take 40 mg by mouth  daily.       [name] levothyroxine (SYNTHROID, LEVOTHROID) 100 MCG tablet Take 100 mcg by mouth daily.        . Linaclotide (LINZESS) 145 MCG CAPS capsule Take 145 mcg by mouth daily.      [name] LINZESS 145 MCG CAPS capsule TAKE ONE (1) CAPSULE EACH DAY  30 capsule  5  . metFORMIN (GLUCOPHAGE) 1000 MG tablet Take 1,000 mg by mouth 2 (two) times daily with a meal.       . metoprolol succinate (TOPROL-XL) 50 MG 24 hr tablet Please take 1.5 tablet (75mg ) daily  60 tablet  6  . morphine (MS CONTIN) 30 MG 12 hr tablet Take 30 mg by mouth 2 (two) times daily. To treat pain associated with 3 bulging disc in back      . nitroGLYCERIN (NITROSTAT) 0.4 MG SL tablet Place 0.4 mg under the tongue every 5 (five) minutes as needed for chest pain.       . pantoprazole (PROTONIX) 40 MG tablet Take 40 mg by mouth 2 (two) times daily.      . Potassium Chloride Crys CR (KLOR-CON M20 PO) Take 20 mEq by mouth daily.       . vitamin B-12 (CYANOCOBALAMIN) 1000 MCG tablet Take 1,000 mcg by mouth daily.      [name] zolpidem (AMBIEN CR) 12.5 MG CR tablet Take 1 tablet (12.5 mg total) by mouth at bedtime as needed for sleep.  30 tablet  1   No current facility-administered medications on file prior to visit.    ROS:  Out of a complete 14 system review of symptoms, the patient complains only of the following symptoms, and all other reviewed systems are negative.  Blurred vision, blindness left eye Frequency of urination Headache  Blood pressure 112/51, pulse 73, weight 153 lb (69.4 kg).  Physical Exam  General: The patient is alert and cooperative at the time of the examination. The patient is on oxygen.  Skin: 2+ edema in the lower extremities below the knees are seen bilaterally.   Neurologic Exam  Mental status: The patient is oriented x 3.  Cranial nerves: Facial symmetry is present. Speech is normal, no aphasia or dysarthria is noted. Extraocular movements are full. Visual fields are full. The patient is blind in the  left eye, no light perception.  Motor: The patient has good strength in all 4 extremities, with exception that there is some 4/5 strength proximally in both legs. The patient is unable to arise from a seated position with arms crossed.  Sensory examination: Soft touch sensation is symmetric on the face, arms, and legs.  Coordination: The patient  has good finger-nose-finger and heel-to-shin bilaterally.  Gait and station: The patient has a normal gait. Tandem gait is unsteady. Romberg is negative, but is unsteady. No drift is seen.  Reflexes: Deep tendon reflexes are symmetric.   Assessment/Plan:  1. 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 The patient will have blood work done today, the sedimentation rate is unremarkable, the steroid dose will be dropped again. Hopefully within the next several months, the patient may be able to consider getting cataract surgery on the right eye. The patient will followup otherwise in 4 months.  Marlan Palau. Keith Shariyah Eland MD 12/31/2013 4:48 PM  [facility] [address] [place], [place] [id number]  Phone [phone] Fax [phone]

## 2014-01-01 ENCOUNTER — Telehealth: Payer: Self-pay | Admitting: Neurology

## 2014-01-01 LAB — CBC WITH DIFFERENTIAL
BASOS: 0 %
Basophils Absolute: 0 10*3/uL (ref 0.0–0.2)
Eos: 1 %
Eosinophils Absolute: 0.1 10*3/uL (ref 0.0–0.4)
HEMATOCRIT: 26.8 % — AB (ref 34.0–46.6)
Hemoglobin: 8.8 g/dL — ABNORMAL LOW (ref 11.1–15.9)
LYMPHS: 25 %
Lymphocytes Absolute: 2.5 10*3/uL (ref 0.7–3.1)
MCH: 31.5 pg (ref 26.6–33.0)
MCHC: 32.8 g/dL (ref 31.5–35.7)
MCV: 96 fL (ref 79–97)
MONOS ABS: 1 10*3/uL — AB (ref 0.1–0.9)
Monocytes: 10 %
NEUTROS PCT: 62 %
Neutrophils Absolute: 6.3 10*3/uL (ref 1.4–7.0)
Platelets: 269 10*3/uL (ref 150–379)
RBC: 2.79 x10E6/uL — ABNORMAL LOW (ref 3.77–5.28)
RDW: 13 % (ref 12.3–15.4)
WBC: 9.9 10*3/uL (ref 3.4–10.8)

## 2014-01-01 LAB — COMPREHENSIVE METABOLIC PANEL
A/G RATIO: 2.2 (ref 1.1–2.5)
ALT: 16 IU/L (ref 0–32)
AST: 16 IU/L (ref 0–40)
Albumin: 3.8 g/dL (ref 3.6–4.8)
Alkaline Phosphatase: 47 IU/L (ref 39–117)
BUN/Creatinine Ratio: 34 — ABNORMAL HIGH (ref 11–26)
BUN: 36 mg/dL — AB (ref 8–27)
CO2: 35 mmol/L — AB (ref 18–29)
Calcium: 9.7 mg/dL (ref 8.7–10.3)
Chloride: 99 mmol/L (ref 96–108)
Creatinine, Ser: 1.05 mg/dL — ABNORMAL HIGH (ref 0.57–1.00)
GFR calc Af Amer: 64 mL/min/{1.73_m2} (ref >59)
GFR, EST NON AFRICAN AMERICAN: 55 mL/min/{1.73_m2} — AB (ref >59)
GLOBULIN, TOTAL: 1.7 g/dL (ref 1.5–4.5)
GLUCOSE: 85 mg/dL (ref 65–99)
Potassium: 4.2 mmol/L (ref 3.5–5.2)
Sodium: 139 mmol/L (ref 134–144)
TOTAL PROTEIN: 5.5 g/dL — AB (ref 6.0–8.5)
Total Bilirubin: 0.2 mg/dL (ref 0.0–1.2)

## 2014-01-01 LAB — IMMATURE CELLS: Bands(Auto) Relative: 2 %

## 2014-01-01 LAB — SEDIMENTATION RATE: SED RATE: 3 mm/h (ref 0–40)

## 2014-01-01 MED ORDER — DEXAMETHASONE 2 MG PO TABS: 6.0000 mg | ORAL_TABLET | Freq: Every day | ORAL | Status: DC

## 2014-01-01 NOTE — Telephone Encounter (Signed)
I called patient. The sedimentation rate was 3, the patient will drop the Decadron to 6 mg daily. We will recheck in 6 weeks. The patient is developing anemia again, which has been a chronic issue for her. She occasionally requires blood transfusions. I will send the reports to her primary Dr.

## 2014-01-01 NOTE — Telephone Encounter (Signed)
I tried to call the patient, no answer. The sedimentation rate is low, we will reduce the Decadron dose to 6 mg daily for least 6 weeks. Hemoglobin is 8.8, lower than last checked, we'll need to recheck within the next 3-4 weeks. Chronic renal insufficiency is seen, stable. I'll call the patient back later.

## 2014-01-14 ENCOUNTER — Ambulatory Visit: Payer: Self-pay | Admitting: Neurology

## 2014-01-14 ENCOUNTER — Ambulatory Visit: Payer: Self-pay | Admitting: Adult Health

## 2014-01-15 ENCOUNTER — Telehealth: Payer: Self-pay | Admitting: Neurology

## 2014-01-15 NOTE — Telephone Encounter (Signed)
Spoke with patient, said that pain in left eye is coming and going but not as severe as it was during her first office visit ,right eye is not fuzzy and having normal headaches,does not know what is going on. She has increased the xtra strength tylenol today to 2 tablets and no relief.

## 2014-01-15 NOTE — Telephone Encounter (Signed)
Patient complains of having really bad headaches on left side of eye and right eye still fuzzy.  Taking steroids 1.5 and tylenol and nothing seems to help.  Also having a hard time sleeping at night.  Please call and advise.

## 2014-01-15 NOTE — Telephone Encounter (Signed)
The patient continues to have her usual symptoms of some mild visual blurring, and mild headaches. Nothing new. The patient is coming down on the Decadron, currently on 6 mg daily. We will recheck the blood work in several weeks, if the sedimentation rate remains low, we will continue to drop the dose of the steroids. I think that she will feel better on a lower dose of this medication, she is still not sleeping well at night even with the sleeping pills.

## 2014-01-21 ENCOUNTER — Telehealth: Payer: Self-pay | Admitting: Neurology

## 2014-01-21 NOTE — Telephone Encounter (Signed)
Patient's caregiver Pam calling to state that [name] wanted her to do bloodwork to check on her steroid levels, wants to know if she has to have it done here or if she can get it done at Dr. [name]' office, her PCP. Please return call and advise.

## 2014-01-22 NOTE — Telephone Encounter (Signed)
I called the patient. If the primary MD is willing to check the ESR and send it to me, that is OK.

## 2014-01-22 NOTE — Telephone Encounter (Signed)
Pt called back and wanted to know if [name] wanted her to have her lab work done in our office or can pt have it done in [place] at her PCP. I informed the pt that [name] was out of the office until next Tuesday and pt stated that it was fine [name] could just let her know something when he gets back. Please advise

## 2014-01-28 ENCOUNTER — Other Ambulatory Visit (INDEPENDENT_AMBULATORY_CARE_PROVIDER_SITE_OTHER): Payer: Self-pay | Admitting: Internal Medicine

## 2014-01-28 DIAGNOSIS — K5900 Constipation, unspecified: Secondary | ICD-10-CM

## 2014-01-28 MED ORDER — LINACLOTIDE 145 MCG PO CAPS: 145.0000 ug | ORAL_CAPSULE | Freq: Every day | ORAL | Status: DC

## 2014-01-30 ENCOUNTER — Telehealth: Payer: Self-pay | Admitting: *Deleted

## 2014-01-30 ENCOUNTER — Telehealth: Payer: Self-pay | Admitting: Neurology

## 2014-01-30 MED ORDER — TRAZODONE HCL 150 MG PO TABS: 150.0000 mg | ORAL_TABLET | Freq: Every evening | ORAL | Status: DC | PRN

## 2014-01-30 NOTE — Telephone Encounter (Signed)
I called back.  Spoke with Mardelle MatteAndy.  He wanted to verify patient is to take Trazodone.  I verified per previous note patient will take Trazodone and d/c Ambien.  They will proceed with request and contact patient when Rx is ready.

## 2014-01-30 NOTE — Telephone Encounter (Signed)
Pharmacist Mardelle Mattendy would like a call back regarding recent prescription request. Please call at [phone]

## 2014-01-30 NOTE — Telephone Encounter (Signed)
Please advise previous note. Thanks  °

## 2014-01-30 NOTE — Telephone Encounter (Signed)
I called patient. The patient is having trouble sleeping, and he does not help. The patient is on Xanax taking 1 mg 3 times daily. The patient will be taken off of Ambien, and she will be given a trial on trazodone. Hopefully within the next several weeks, the Decadron dose can be reduced again, and this may help her sleeping habits she reports some blurring of vision in the right eye associated with her cataract, she should be able to have cataract surgery sometime in August or early September.

## 2014-01-30 NOTE — Telephone Encounter (Signed)
Please advise 

## 2014-02-05 ENCOUNTER — Other Ambulatory Visit: Payer: Self-pay | Admitting: Neurology

## 2014-02-06 NOTE — Telephone Encounter (Signed)
The patient is on Xanax taking 1 mg 3 times daily. The patient will be taken off of Ambien, and she will be given a trial on trazodone.

## 2014-02-10 ENCOUNTER — Telehealth: Payer: Self-pay | Admitting: Neurology

## 2014-02-10 NOTE — Telephone Encounter (Signed)
I called patient. I left a message. It appears that she is calling to her usual issue with the right eye and with the headache. We are continuing the Decadron taper. She will call me back if there are any new significant issues that need to discuss. We can get her worked into the office quickly if needed. The patient appears to call quite frequently about the same issues.

## 2014-02-10 NOTE — Telephone Encounter (Signed)
Please advise previous note. Thanks  °

## 2014-02-10 NOTE — Telephone Encounter (Signed)
Patient called stating she has been having a headache for a month over the right eye and she would like to speak with [name] concerning this matter.

## 2014-02-14 ENCOUNTER — Other Ambulatory Visit (INDEPENDENT_AMBULATORY_CARE_PROVIDER_SITE_OTHER): Payer: Self-pay | Admitting: Internal Medicine

## 2014-02-16 NOTE — Telephone Encounter (Signed)
Per NUR may refill with 5 addtional

## 2014-02-17 ENCOUNTER — Telehealth: Payer: Self-pay | Admitting: *Deleted

## 2014-02-17 ENCOUNTER — Telehealth: Payer: Self-pay | Admitting: Neurology

## 2014-02-17 DIAGNOSIS — M316 Other giant cell arteritis: Secondary | ICD-10-CM

## 2014-02-17 NOTE — Telephone Encounter (Signed)
I called the patient. The patient is not sleeping well. She is on the 150 mg trazodone tablet daily. She is to go to 300 mg at night. The ESR is 4 we will go to 4 mg of decadron daily. Check the ESR in 6 weeks. She has a revisit scheduled in September.

## 2014-02-17 NOTE — Telephone Encounter (Signed)
Not sure if you have had a chance to review earlier message. Patient has called again:  Patient called and stated she had Sedimentation drawn at Dr. Mabeline CarasMcGinnis's office on 7/22. They were faxing over results for your review and she stated it was a 4. She's also requesting a possible change in Rx for traZODone (DESYREL) 150 MG tablet due to not sleeping well. Please call and advise

## 2014-02-17 NOTE — Telephone Encounter (Signed)
Patient called and stated she had Sedimentation drawn at Dr. Mabeline CarasMcGinnis's office on 7/22.  They were faxing over results for your review and she stated it was a 4.  She's also requesting a possible change in Rx for traZODone (DESYREL) 150 MG tablet due to not sleeping well.  Please call and advise.

## 2014-02-17 NOTE — Telephone Encounter (Signed)
Please advise patient as she is having much difficulty:  Having difficulty with the steroids. Can she cut them back? Should she be putting eye drops in both eyes? Or will this cause problems? Needs her sleeping pill changed. Please call.

## 2014-02-17 NOTE — Telephone Encounter (Signed)
I called patient. She is to come in for a sedimentation rate check, if this is in a good range, we will continue to lower the dose of the Decadron.

## 2014-02-18 ENCOUNTER — Telehealth: Payer: Self-pay | Admitting: Neurology

## 2014-02-18 MED ORDER — ZOLPIDEM TARTRATE ER 12.5 MG PO TBCR: 12.5000 mg | EXTENDED_RELEASE_TABLET | Freq: Every evening | ORAL | Status: DC | PRN

## 2014-02-18 NOTE — Telephone Encounter (Signed)
I got a message, I will discontinue the trazodone, call in a prescription for the Ambien CR.

## 2014-02-18 NOTE — Telephone Encounter (Signed)
Patient: Kara CraverCArolyn Meader 02/02/1946 DR. Willis Wants to go back to Ambien CR but to increase the dosage - she is not comfortable with the trazodone [facility]acy [facility] [phone]

## 2014-02-18 NOTE — Telephone Encounter (Signed)
Ms.Etherington would like to go back on Ambien CR with a dosage increase. She is not comfortable taking Trazodone. She would like it sent to her pharmacy.

## 2014-02-20 ENCOUNTER — Telehealth: Payer: Self-pay | Admitting: Neurology

## 2014-02-20 MED ORDER — DEXAMETHASONE 4 MG PO TABS: 4.0000 mg | ORAL_TABLET | Freq: Every day | ORAL | Status: DC

## 2014-02-20 NOTE — Telephone Encounter (Signed)
I called and spoke with patient.  She would like Dr Jannifer Franklin to know she has weakness and pain on her left side, in arm and leg.  Indicates she has had this pain since Feb or March.  States she takes Tylenol, but it does not seem to help.  She would like to know what else he suggests, and if she should schedule a sooner appt (currently scheduled on 09/24).  Says it is ok to leave a message.  Also said she prefers to have the Decadron Rx for 46m tablet once daily (rather than 231mtwo daily) and wants Rx for 90 days.   Per last phone note:  ChKathrynn DuckingMD at 02/17/2014 4:53 PM     Status: Signed        I called the patient. The patient is not sleeping well. She is on the 150 mg trazodone tablet daily. She is to go to 300 mg at night. The ESR is 4 we will go to 4 mg of decadron daily.

## 2014-02-20 NOTE — Telephone Encounter (Signed)
Pt stated dexamethasone (DECADRON) 2 MG tablet need to read 1 tablet a day.  [facility] forward to [facility].  Please call and advise anytime and may leave a message if not available.

## 2014-02-20 NOTE — Telephone Encounter (Signed)
I called patient. The patient has had some symptoms on the left side of her body with pain and perceived weakness since February or March of 2015. I'll be seeing her in the next couple months, we can address this issue than. We have recently decreased the Decadron dose, hopefully this will help some of her side effects.

## 2014-02-20 NOTE — Telephone Encounter (Signed)
Pt complains of headache per over left eye.  Opthamologist suggested to use over counter eye drops along with Restasis to keep eye lubricated, she's requesting an ok from [name].  Please call and advise anytime and may leave message on vm.  Thanks

## 2014-02-20 NOTE — Telephone Encounter (Signed)
Please refer to telephone note from earlier today. 

## 2014-02-20 NOTE — Telephone Encounter (Signed)
Pt complains of headache per over left eye.  Opthamologist suggested to use over counter eye drops along with Restasis to keep eye lubricated, she's requesting an ok from Dr. Willis.  Please call and advise anytime and may leave message on vm.  Thanks °

## 2014-02-23 ENCOUNTER — Telehealth: Payer: Self-pay | Admitting: Neurology

## 2014-02-23 DIAGNOSIS — M316 Other giant cell arteritis: Secondary | ICD-10-CM

## 2014-02-23 NOTE — Telephone Encounter (Signed)
I called patient. The patient continues to have headaches, and she says that she has some left-sided weakness, MRI of the brain was ordered in February 2015, but was never done. I will reorder this. Her last sedimentation rate was 4.

## 2014-02-23 NOTE — Telephone Encounter (Signed)
Patient calling to request a call from Dr. Willis to discuss her terrible headaches and to ask if Dr. Willis still wants her to have an MRI, patient also states that the weakness in her left side is not something new. Please return call and advise.  °

## 2014-02-23 NOTE — Telephone Encounter (Signed)
Patient calling to request a call from [name] to discuss her terrible headaches and to ask if [name] still wants her to have an MRI, patient also states that the weakness in her left side is not something new. Please return call and advise.

## 2014-02-26 NOTE — Telephone Encounter (Signed)
Noted  

## 2014-03-03 ENCOUNTER — Telehealth: Payer: Self-pay | Admitting: Neurology

## 2014-03-03 NOTE — Telephone Encounter (Signed)
Patient calling to state that she would like to speak with [name], told patient that he would be out of the office until 03/11/14 and patient states that she really needs to see him and that she is coming in on 03/11/14 to see him anyway even if she doesn't have an appointment.

## 2014-03-05 ENCOUNTER — Ambulatory Visit
Admission: RE | Admit: 2014-03-05 | Discharge: 2014-03-05 | Disposition: A | Discharge status: Home / Self Care | Payer: Medicare HMO | Source: Ambulatory Visit | Attending: Neurology | Admitting: Neurology

## 2014-03-05 ENCOUNTER — Ambulatory Visit: Payer: Medicare Other | Admitting: Neurology

## 2014-03-05 DIAGNOSIS — M316 Other giant cell arteritis: Secondary | ICD-10-CM

## 2014-03-07 ENCOUNTER — Other Ambulatory Visit: Payer: Medicare Other

## 2014-03-09 NOTE — Telephone Encounter (Signed)
Called patient back and scheduled her appointment with megan.

## 2014-03-10 ENCOUNTER — Telehealth: Payer: Self-pay | Admitting: Neurology

## 2014-03-10 NOTE — Telephone Encounter (Signed)
I called patient. MRI the brain is relatively unremarkable, very minimal small vessel changes. Nothing on the head scan explain her left-sided symptoms. She will be seen in office tomorrow, she is complaining of palpitations, I indicated that she is to contact her primary care physician regarding this symptom. She is on 4 mg daily of Decadron.    MRI brain 03/05/2014:  IMPRESSION: 1. No acute intracranial abnormality. 2. Progressed signal changes since 2007, but still mild for age, are most compatible with chronic small vessel disease.

## 2014-03-11 ENCOUNTER — Encounter: Payer: Self-pay | Admitting: Adult Health

## 2014-03-11 ENCOUNTER — Ambulatory Visit (INDEPENDENT_AMBULATORY_CARE_PROVIDER_SITE_OTHER): Payer: Medicare HMO | Admitting: Adult Health

## 2014-03-11 VITALS — BP 114/53 | HR 64 | Ht 66.0 in | Wt 165.0 lb

## 2014-03-11 DIAGNOSIS — H5460 Unqualified visual loss, one eye, unspecified: Secondary | ICD-10-CM

## 2014-03-11 DIAGNOSIS — H5462 Unqualified visual loss, left eye, normal vision right eye: Secondary | ICD-10-CM

## 2014-03-11 DIAGNOSIS — M316 Other giant cell arteritis: Secondary | ICD-10-CM

## 2014-03-11 NOTE — Patient Instructions (Signed)
Temporal Arteritis Arteries are blood vessels that carry blood from the heart to all parts of the body. Temporal arteritis is a swelling (inflammation) of certain large arteries. This usually affects arteries in the head and neck area, including arteries in the area on the side of the head, between the ears and eyes (temples). The condition can be very painful. It also can cause serious problems, even blindness. Early diagnosis and treatment is very important. CAUSES  Temporal arteritis results from the body reacting to injury or infection (inflammation). This may occur when the body's immune system (which fights germs and disease) makes a mistake. It attacks its own arteries. No one knows why this happens. However, certain things (risk factors) make it more likely that a person will develop temporal arteritis. They include:  Age. Most people with temporal arteritis are older than 50. The average age is 70.  Sex. Three times more women than men develop the condition.  Race and ethnic background. Caucasians are more likely to have temporal arteritis than other races. So are people whose families came from [place], [place] or [place]  Having polymyalgia rheumatica (PMR). This condition causes stiffness and pain in the joints of the neck, shoulders and hips. About 15% of people with PMR also have temporal arteritis. SYMPTOMS  Not everyone with temporal arteritis has the same symptoms. Some people have just one symptom. Others may have several. The most common symptom is a new headache, often in the temple region. Symptoms may show up in other parts of the body too.   Symptoms affecting the head may include:  Temporal arteries that feel hard or swollen. It may hurt when the temples are touched.  Pain when combing your hair, or when laying your head on a pillow.  Pain in the jaw when chewing.  Pain in the throat or tongue.  Visual problems, including sudden loss of  vision in one eye, or seeing double.  Symptoms in other parts of the body may include:  Fever.  Fatigue.  A dry cough.  Pain in the hips and shoulders.  Pain in the arms during exercise.  Depression.  Weight loss. DIAGNOSIS  Symptoms of temporal arteritis are similar to symptoms for other conditions. This can make it hard to tell if you have the condition. To be sure, your caregiver will ask about your symptoms and do a physical exam. Certain tests may be necessary, such as:   An exam of your temples. Often, the temporal arteries will be swollen and hard. This can be felt.  A complete blood count. This test shows how many red blood cells are in your blood. Most people with temporal arteritis do not have enough red blood cells (anemic).  Erythrocyte sedimentation (also called sed rate test). It measures inflammation in the body. Almost everyone with temporal arteritis has a high sed rate.  C reactive protein test. This also shows if there is inflammation.  Biopsy a temporal artery. This means the caregiver will take out a small piece of an artery. Then, it is checked under a microscope for inflammation. More than one biopsy may be needed. That is because inflammation can be in one part of an artery and not in others. Your caregiver may need to check more than one spot. TREATMENT  Starting treatment right away is very important. Often, you will need to see a specialist in immunologic diseases (rheumatologist). Goals of treatment include protecting your eyesight. Once vision is gone, it might not come   back. The normal treatment is medication. It usually works well and quickly. Most people start getting better in a few days. Medication options include:  Corticosteroids. These are powerful drugs that fight inflammation. These drugs are most often used to treat temporal arteritis.  Usually, a high dose is taken at first. After symptoms improve, a smaller dose is used. The goal is to take  the smallest dose possible and still control your symptoms. That is because using corticosteroids for a long time can cause problems. They can make muscles and bones weak. They can cause blood pressure to go up, and cause diabetes. Also, people often gain weight when they take corticosteroids. Corticosteroids may need to be taken for one or two years.  Several newer drugs are being tested to treat temporal arteritis. Researchers are testing to see if new drugs will work as well as corticosteroids, but cause fewer problems than them. Testing of these drugs is not yet complete.  Some specialists recommend low dose aspirin to prevent blood clots. HOME CARE INSTRUCTIONS   Take any corticosteroids that your caregiver prescribes. Follow the directions carefully.  Take any vitamins or supplements that your caregiver suggests. This may include vitamin D and calcium. They help keep your bones from becoming weak.  Keep all appointments for checkups. Your caregiver will watch for any problems from the medication. Checkups may include:  Periodic blood tests.  Bone density testing. This checks how strong or weak your bones are.  Blood pressure checks. If your blood pressure rises, you may need to take a drug to control it while you are taking corticosteroids.  Blood sugar checks. This is to be sure you are not developing diabetes. If you have diabetes, corticosteroid medications may make it worse and require increased treatment.  Exercise. First, talk with your caregiver about what would be OK for you to do. Aerobic exercise (which increases your heart rate) is usually suggested. It does not have to require a lot of energy. Walking is aerobic exercise. This type of exercise is good because it helps prevent bone loss. It also helps control your blood pressure.  Follow a healthy diet. The goal is to prevent bone damage and diabetes. Include good sources of protein in your diet. Also, include fruits,  vegetables and whole grains. Your caregiver can refer you to an expert on healthy eating (dietitian) for more advice. SEEK MEDICAL CARE IF:   The symptoms that lead to your diagnosis return.  You develop worsening fever, fatigue, headache, weight loss, or pain in your jaw.  You develop signs of infection. Infections can be worse if you are on corticosteroid medication. SEEK IMMEDIATE MEDICAL CARE IF:   Your eyesight changes.  Pain does not go away, even after taking pain medicine.  You feel pain in your chest.  Breathing is difficult.  One side of your face or body suddenly becomes weak or numb.  You develop a fever of more than 102 F (38.9 C). Document Released: 05/07/2009 Document Revised: 10/02/2011 Document Reviewed: 09/03/2013 Exit[employer] Patient Information 2015 [employer]. This information is not intended to replace advice given to you by your health care provider. Make sure you discuss any questions you have with your health care provider.  

## 2014-03-11 NOTE — Progress Notes (Signed)
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 She returns today for follow-up. She is currently taking Decadron 4 mg daily. Her last sed rate was 4. She had an MRI of the brain due to left sided symptoms. Her scan was unremarkable. Patient states that she has blurry vision on the right eye. She has a cataract and her optometrist said that if she removed it she could see better. She uses eye drops in the right eye.. She states that her headaches have improved. She states that she has a dull headache she will use Tylenol and it seems to work well for her. Patient is concerned about some of the same symptoms she's complained in the past according to her chart. She was concerned about weight gain, weakness and not being able to sleep well at night. She was recently switched from trazodone back to Ambien. She states that the Ambien initially gave her nightmares but that has improved. She still states that she does not get the best rest at night. She returns today for an evaluation.  HISTORY 12/31/13 (CW): history of temporal arteritis and subsequent blindness in the left eye. The patient is on Decadron taking 8 mg daily. She has had insomnia, peripheral edema, anxiety, and some muscle weakness on this dose of medication. The patient does have some blurring involving the right eye that is partially corrected with glasses. The patient has a cataract on the right eye that will require surgery, but this has been delayed secondary to the temporal arteritis. The patient indicates that her memory has been poor since the onset of the temporal arteritis. The patient feels weak all over. She has had some problems with hypotension, and she has been taken off of lisinopril one month ago. The patient returns to the office for further evaluation.  The patient is having occasional sharp brief headaches that are mainly in the left temporal area lasting a few moments occurring 2 or 3 times a day. These are very different from the headache she was having prior to initiating steroid treatment. She continues to have some neck stiffness.   REVIEW OF SYSTEMS: Full 14 system review of systems performed and notable only for:  Constitutional: N/A  Eyes: N/A Ear/Nose/Throat: N/A  Skin: N/A  Cardiovascular: N/A  Respiratory: N/A  Gastrointestinal: N/A  Genitourinary: N/A Hematology/Lymphatic: N/A  Endocrine: N/A Musculoskeletal:N/A  Allergy/Immunology: N/A  Neurological: N/A Psychiatric: N/A Sleep: N/A   ALLERGIES: Allergies  Allergen Reactions  . Prednisone     Can have in small doses  . Codeine Nausea And Vomiting and Rash    HOME MEDICATIONS: Outpatient Prescriptions Prior to Visit  Medication Sig Dispense Refill  . acetaminophen (TYLENOL) 325 MG tablet Take 650 mg by mouth every 6 (six) hours as needed for mild pain.       [name] ALPRAZolam (XANAX) 1 MG tablet Take 1 mg by mouth 3 (three) times daily as needed for anxiety.       . cholecalciferol (VITAMIN D) 1000 UNITS tablet Take 1,000 Units by mouth daily.      . citalopram (CELEXA) 20 MG tablet Take 20 mg by mouth 2 (two) times daily.       . clotrimazole-betamethasone (LOTRISONE) cream Apply 1 application topically 2 (two) times daily. Apply to ear      .  dexamethasone (DECADRON) 4 MG tablet Take 1 tablet (4 mg total) by mouth daily.  90 tablet  0  . fenofibrate 160 MG tablet Take 160 mg by mouth at bedtime.      . furosemide (LASIX) 80 MG tablet Take 40 mg by mouth daily.       [name]. levothyroxine (SYNTHROID, LEVOTHROID) 100 MCG tablet Take 100 mcg by mouth daily.        . Linaclotide (LINZESS) 145 MCG CAPS capsule Take 1 capsule (145 mcg total) by mouth daily.  30 capsule  5  . metFORMIN (GLUCOPHAGE) 1000 MG tablet Take 1,000 mg by mouth 2 (two) times daily with a meal.       .  metoprolol succinate (TOPROL-XL) 50 MG 24 hr tablet Please take 1.5 tablet (75mg ) daily  60 tablet  6  . morphine (MS CONTIN) 30 MG 12 hr tablet Take 30 mg by mouth 2 (two) times daily. To treat pain associated with 3 bulging disc in back      . nitroGLYCERIN (NITROSTAT) 0.4 MG SL tablet Place 0.4 mg under the tongue every 5 (five) minutes as needed for chest pain.       . pantoprazole (PROTONIX) 40 MG tablet Take 40 mg by mouth 2 (two) times daily.      . Potassium Chloride Crys CR (KLOR-CON M20 PO) Take 20 mEq by mouth daily.       . vitamin B-12 (CYANOCOBALAMIN) 1000 MCG tablet Take 1,000 mcg by mouth daily.      [name]. zolpidem (AMBIEN CR) 12.5 MG CR tablet Take 1 tablet (12.5 mg total) by mouth at bedtime as needed for sleep.  30 tablet  1  . pantoprazole (PROTONIX) 40 MG tablet TAKE ONE TABLET TWICE DAILY BEFORE MEALS  60 tablet  5   No facility-administered medications prior to visit.    PAST MEDICAL HISTORY: Past Medical History  Diagnosis Date  . Type 2 diabetes mellitus   . Arthritis   . COPD (chronic obstructive pulmonary disease)     Home oxygen  . Palpitations     Reports history of atrial fibrillation, although none seen recently  . Coronary atherosclerosis of native coronary artery     Nonobstructive at catheterization 2008  . GERD (gastroesophageal reflux disease)   . Irritable bowel syndrome   . Chronic anemia   . Hypothyroidism   . Hyperlipidemia   . Chronic edema   . Pneumonia   . Diabetes mellitus   . Hypertension   . Chronic pain   . Anemia 08/04/2011  . Anemia of chronic disease 08/04/2011  . Bulging lumbar disc     3  . Headache(784.0) 07/30/2013  . Sprain of neck 07/30/2013  . Asthma   . 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 Renal insufficiency 08/07/2011  . Claustrophobia   . Temporal arteritis 10/14/2013    PAST SURGICAL HISTORY: Past Surgical History  Procedure Laterality Date  . Cholecystectomy    . Appendectomy    . Foot surgery    . Abdominal  hysterectomy    . Cataract extraction w/phaco Left 09/01/2013    Procedure: CATARACT EXTRACTION PHACO AND INTRAOCULAR LENS PLACEMENT (IOC);  Surgeon: Gemma PayorKerry Hunt, MD;  Location: [facility];  Service: Ophthalmology;  Laterality: Left;  CDE:  14.13  . Artery biopsy Left 09/22/2013    Procedure: BIOPSY TEMPORAL ARTERY;  Surgeon: Mariella SaaBenjamin T Hoxworth, MD;  Location: [facility];  Service: General;  Laterality: Left;    FAMILY HISTORY: Family History  Problem Relation Age of Onset  . Hypertension    . Diabetes Brother   . Migraines Brother     SOCIAL HISTORY: History   Social History  . Marital Status: Divorced    Spouse Name: N/A    Number of Children: 2  . Years of Education: hs   Occupational History  . DISABLED    Social History Main Topics  . Smoking status: Former Smoker -- 1.00 packs/day for 48 years    Types: Cigarettes    Quit date: 12/28/1995  . Smokeless tobacco: Never Used  . Alcohol Use: No  . Drug Use: No  . Sexual Activity: Not Currently   Other Topics Concern  . Not on file   Social History Narrative   Patient is divorced with 2 children.   Patient is right handed.   Patient has a high school education.   Patient drinks 2 12 oz sodas daily.      PHYSICAL EXAM  Filed Vitals:   03/11/14 1114  BP: 114/53  Pulse: 64  Height: 5\' 6"  (1.676 m)  Weight: 165 lb (74.844 kg)   Body mass index is 26.64 kg/(m^2).  Generalized: Well developed, in no acute distress  Skin: Generalized bruising, 2+ pitting edema in the lower extremities.   Neurological examination  Mentation: Alert oriented to time, place, history taking. Follows all commands speech and language fluent Cranial nerve II-XII: Pupils were equal round reactive to light. Extraocular movements were full, visual field were full on confrontational test. Facial sensation and strength were normal.  Head turning and shoulder shrug  were normal and symmetric. Motor: The motor testing reveals 5 over 5 strength in the  upper extremities and 4/5 strength in the lower extremities. Good symmetric motor tone is noted throughout.  Sensory: Sensory testing is intact to soft touch on all 4 extremities. No evidence of extinction is noted.  Gait and station: Gait is normal. Tandem gait not attempted.  Reflexes: Deep tendon reflexes are symmetric and normal bilaterally.    DIAGNOSTIC DATA (LABS, IMAGING, TESTING) - I reviewed patient records, labs, notes, testing and imaging myself where available.  Lab Results  Component Value Date   WBC 9.9 12/31/2013   HGB 8.8* 12/31/2013   HCT 26.8* 12/31/2013   MCV 96 12/31/2013   PLT 269 12/31/2013      Component Value Date/Time   NA 139 12/31/2013 1010   NA 137 10/03/2013 0206   K 4.2 12/31/2013 1010   CL 99 12/31/2013 1010   CO2 35* 12/31/2013 1010   GLUCOSE 85 12/31/2013 1010   GLUCOSE 120* 10/03/2013 0206   BUN 36* 12/31/2013 1010   BUN 33* 10/03/2013 0206   CREATININE 1.05* 12/31/2013 1010   CALCIUM 9.7 12/31/2013 1010   PROT 5.5* 12/31/2013 1010   PROT 6.6 08/19/2013 0544   ALBUMIN 3.0* 08/19/2013 0544   AST 16 12/31/2013 1010   ALT 16 12/31/2013 1010   ALKPHOS 47 12/31/2013 1010   BILITOT 0.2 12/31/2013 1010   GFRNONAA 55* 12/31/2013 1010   GFRAA 64 12/31/2013 1010   No results found for this basename: CHOL, HDL, LDLCALC, LDLDIRECT, TRIG, CHOLHDL   Lab Results  Component Value Date   HGBA1C 5.8* 10/02/2013   Lab Results  Component Value Date   VITAMINB12 666 06/09/2013   Lab Results  Component Value Date   TSH 1.249 08/19/2013      ASSESSMENT AND PLAN 68 y.o. year old female  has a past medical history of Type 2  diabetes mellitus; Arthritis; COPD (chronic obstructive pulmonary disease); Palpitations; Coronary atherosclerosis of native coronary artery; GERD (gastroesophageal reflux disease); Irritable bowel syndrome; Chronic anemia; Hypothyroidism; Hyperlipidemia; Chronic edema; Pneumonia; Diabetes mellitus; Hypertension; Chronic pain; Anemia (08/04/2011); Anemia of  chronic disease (08/04/2011); Bulging lumbar disc; Headache(784.0) (07/30/2013); Sprain of neck (07/30/2013); Asthma; Visual loss, left eye (09/12/2013); Renal insufficiency (08/07/2011); Claustrophobia; and Temporal arteritis (10/14/2013). here with:  1. Temporal arteritis 2. Visual loss in the left eye   The patient is concerned about some of the same symptoms that she has discussed with Dr. Anne Hahn at previous visits as well as on the telephone. I explained today that most of her symptoms are related to the Decadron. I explained that the goal is to continue checking her sedimentation rate and as long as it remains in normal range we will continue to decrease the Decadron. The patient should have her next sedimentation rate checked around September 11. The patient would like to keep her scheduled appointment with Dr. Anne Hahn next month.   Butch Penny, MSN, NP-C 03/11/2014, 11:26 AM [facility] [address] [place], [place] [address] [phone]  Note: This document was prepared with digital dictation and possible smart phrase technology. Any transcriptional errors that result from this process are unintentional.

## 2014-03-24 ENCOUNTER — Telehealth: Payer: Self-pay | Admitting: Neurology

## 2014-03-24 NOTE — Telephone Encounter (Signed)
I called patient. The patient is having her usual complaints of blurry vision. The patient is scheduled to have a sedimentation rate around September 9. She is to come in to get this done, the orders are already in [facility], I will call her with the results.

## 2014-03-24 NOTE — Telephone Encounter (Signed)
Patient states she's experiencing swelling of Left side of face and has blurry vision out of right eye.  Using over the counter medication to keep both eyes lubricated.  She's feeling very weak and has chills.  Questioning if side effects are coming from steroids?  Please call anytime and may leave detailed message on voice mail. °

## 2014-03-24 NOTE — Telephone Encounter (Signed)
Patient states she's experiencing swelling of Left side of face and has blurry vision out of right eye.  Using over the counter medication to keep both eyes lubricated.  She's feeling very weak and has chills.  Questioning if side effects are coming from steroids?  Please call anytime and may leave detailed message on voice mail.

## 2014-03-25 ENCOUNTER — Telehealth: Payer: Self-pay | Admitting: Neurology

## 2014-03-25 NOTE — Telephone Encounter (Signed)
I called patient. The patient will be going for an epidural injection of the low back, I see no contraindications to this.

## 2014-03-25 NOTE — Telephone Encounter (Signed)
Patient scheduled to have injection Friday for bulging disk. Patient will have twilight anesthesia administered, checking to see if its ok to have anesthesia while taking steroids? Please return call anytime and may leave detailed message if not available.   

## 2014-03-25 NOTE — Telephone Encounter (Signed)
Patient scheduled to have injection Friday for bulging disk. Patient will have twilight anesthesia administered, checking to see if its ok to have anesthesia while taking steroids? Please return call anytime and may leave detailed message if not available.

## 2014-04-03 ENCOUNTER — Telehealth: Payer: Self-pay | Admitting: Neurology

## 2014-04-03 NOTE — Telephone Encounter (Signed)
I have not seen the most recent blood work for the sedimentation rate. I will call the patient when I get these results.

## 2014-04-03 NOTE — Telephone Encounter (Signed)
Patient requesting results from blood work drawn at Dr. McGinnis's office.  Should have received results from his office.  Also stated she can't tolerate Steroids and questioning what else could be done? °

## 2014-04-03 NOTE — Telephone Encounter (Signed)
Patient requesting results from blood work drawn at Dr. Mabeline Caras office.  Should have received results from his office.  Also stated she can't tolerate Steroids and questioning what else could be done?

## 2014-04-08 ENCOUNTER — Telehealth: Payer: Self-pay | Admitting: Neurology

## 2014-04-08 NOTE — Telephone Encounter (Signed)
Patient checking if we received blood work results drawn on 9/15 at Dr. Mabeline Caras office.  If so please call anytime with results and may leave detailed message if not available.

## 2014-04-08 NOTE — Telephone Encounter (Signed)
Patient was informed to call Dr.McGinnis office for her results. She can ask them to results for Dr. Anne Hahn' review. She was given our office fax rate.

## 2014-04-09 ENCOUNTER — Telehealth: Payer: Self-pay | Admitting: Neurology

## 2014-04-09 MED ORDER — DEXAMETHASONE 2 MG PO TABS: ORAL_TABLET | ORAL | Status: DC

## 2014-04-09 NOTE — Telephone Encounter (Signed)
I called the patient. The ESR is 21 (ULN 22). She can reduce the dexamethasone to 2 mg on odd days, and 4 mg on even days. The patient has had a drop in hemoglobin, currently at April 1. The patient is followed by Dr. Milana Huntsman for this, etiology of the anemia is not clear. The patient required a transfusion in February 2015. The patient will followup through this office in the near future. She reports some issues with memory, this will need to be addressed at that time.

## 2014-04-09 NOTE — Telephone Encounter (Signed)
Per previous note from Dr Anne Hahn, She can reduce the dexamethasone to 2 mg on odd days, and 4 mg on even days.  He sent the Rx with these directions.   I called back.  Spoke with Pharmacist and verified Rx.  They will call us back if anything further is needed.

## 2014-04-09 NOTE — Telephone Encounter (Signed)
Harvie Heck, Pharmacist with [facility] @ [phone] questioning e script received for dexamethasone (DECADRON) 2 MG tablet.  Patient was previously taking 4 mg and questioning different dose.  Please call and advise.

## 2014-04-16 ENCOUNTER — Encounter: Payer: Self-pay | Admitting: Neurology

## 2014-04-16 ENCOUNTER — Ambulatory Visit (INDEPENDENT_AMBULATORY_CARE_PROVIDER_SITE_OTHER): Payer: Medicare HMO | Admitting: Neurology

## 2014-04-16 VITALS — BP 100/70 | HR 64 | Wt 162.0 lb

## 2014-04-16 DIAGNOSIS — H5460 Unqualified visual loss, one eye, unspecified: Secondary | ICD-10-CM

## 2014-04-16 DIAGNOSIS — D519 Vitamin B12 deficiency anemia, unspecified: Secondary | ICD-10-CM

## 2014-04-16 DIAGNOSIS — R6889 Other general symptoms and signs: Secondary | ICD-10-CM

## 2014-04-16 DIAGNOSIS — R413 Other amnesia: Secondary | ICD-10-CM

## 2014-04-16 DIAGNOSIS — M316 Other giant cell arteritis: Secondary | ICD-10-CM

## 2014-04-16 DIAGNOSIS — D518 Other vitamin B12 deficiency anemias: Secondary | ICD-10-CM

## 2014-04-16 DIAGNOSIS — R51 Headache: Secondary | ICD-10-CM

## 2014-04-16 DIAGNOSIS — H5462 Unqualified visual loss, left eye, normal vision right eye: Secondary | ICD-10-CM

## 2014-04-16 MED ORDER — DONEPEZIL HCL 5 MG PO TABS: 5.0000 mg | ORAL_TABLET | Freq: Every day | ORAL | Status: DC

## 2014-04-16 NOTE — Patient Instructions (Signed)
Begin Aricept (donepezil) at 5 mg at night for one month. If this medication is well-tolerated, please call our office and we will call in a prescription for the 10 mg tablets. Look out for side effects that may include nausea, diarrhea, weight loss, or stomach cramps. This medication will also cause a runny nose, therefore there is no need for allergy medications for this purpose.    Temporal Arteritis Arteries are blood vessels that carry blood from the heart to all parts of the body. Temporal arteritis is a swelling (inflammation) of certain large arteries. This usually affects arteries in the head and neck area, including arteries in the area on the side of the head, between the ears and eyes (temples). The condition can be very painful. It also can cause serious problems, even blindness. Early diagnosis and treatment is very important. CAUSES  Temporal arteritis results from the body reacting to injury or infection (inflammation). This may occur when the body's immune system (which fights germs and disease) makes a mistake. It attacks its own arteries. No one knows why this happens. However, certain things (risk factors) make it more likely that a person will develop temporal arteritis. They include:  Age. Most people with temporal arteritis are older than 50. The average age is 6.  Sex. Three times more women than men develop the condition.  Race and ethnic background. Caucasians are more likely to have temporal arteritis than other races. So are people whose families came from [place]  Having polymyalgia rheumatica (PMR). This condition causes stiffness and pain in the joints of the neck, shoulders and hips. About 15% of people with PMR also have temporal arteritis. SYMPTOMS  Not everyone with temporal arteritis has the same symptoms. Some people have just one symptom. Others may have several. The most common symptom is a new headache,  often in the temple region. Symptoms may show up in other parts of the body too.   Symptoms affecting the head may include:  Temporal arteries that feel hard or swollen. It may hurt when the temples are touched.  Pain when combing your hair, or when laying your head on a pillow.  Pain in the jaw when chewing.  Pain in the throat or tongue.  Visual problems, including sudden loss of vision in one eye, or seeing double.  Symptoms in other parts of the body may include:  Fever.  Fatigue.  A dry cough.  Pain in the hips and shoulders.  Pain in the arms during exercise.  Depression.  Weight loss. DIAGNOSIS  Symptoms of temporal arteritis are similar to symptoms for other conditions. This can make it hard to tell if you have the condition. To be sure, your caregiver will ask about your symptoms and do a physical exam. Certain tests may be necessary, such as:   An exam of your temples. Often, the temporal arteries will be swollen and hard. This can be felt.  A complete blood count. This test shows how many red blood cells are in your blood. Most people with temporal arteritis do not have enough red blood cells (anemic).  Erythrocyte sedimentation (also called sed rate test). It measures inflammation in the body. Almost everyone with temporal arteritis has a high sed rate.  C reactive protein test. This also shows if there is inflammation.  Biopsy a temporal artery. This means the caregiver will take out a small piece of an artery. Then, it is checked under a microscope for inflammation.  More than one biopsy may be needed. That is because inflammation can be in one part of an artery and not in others. Your caregiver may need to check more than one spot. TREATMENT  Starting treatment right away is very important. Often, you will need to see a specialist in immunologic diseases (rheumatologist). Goals of treatment include protecting your eyesight. Once vision is gone, it might not  come back. The normal treatment is medication. It usually works well and quickly. Most people start getting better in a few days. Medication options include:  Corticosteroids. These are powerful drugs that fight inflammation. These drugs are most often used to treat temporal arteritis.  Usually, a high dose is taken at first. After symptoms improve, a smaller dose is used. The goal is to take the smallest dose possible and still control your symptoms. That is because using corticosteroids for a long time can cause problems. They can make muscles and bones weak. They can cause blood pressure to go up, and cause diabetes. Also, people often gain weight when they take corticosteroids. Corticosteroids may need to be taken for one or two years.  Several newer drugs are being tested to treat temporal arteritis. Researchers are testing to see if new drugs will work as well as corticosteroids, but cause fewer problems than them. Testing of these drugs is not yet complete.  Some specialists recommend low dose aspirin to prevent blood clots. HOME CARE INSTRUCTIONS   Take any corticosteroids that your caregiver prescribes. Follow the directions carefully.  Take any vitamins or supplements that your caregiver suggests. This may include vitamin D and calcium. They help keep your bones from becoming weak.  Keep all appointments for checkups. Your caregiver will watch for any problems from the medication. Checkups may include:  Periodic blood tests.  Bone density testing. This checks how strong or weak your bones are.  Blood pressure checks. If your blood pressure rises, you may need to take a drug to control it while you are taking corticosteroids.  Blood sugar checks. This is to be sure you are not developing diabetes. If you have diabetes, corticosteroid medications may make it worse and require increased treatment.  Exercise. First, talk with your caregiver about what would be OK for you to do. Aerobic  exercise (which increases your heart rate) is usually suggested. It does not have to require a lot of energy. Walking is aerobic exercise. This type of exercise is good because it helps prevent bone loss. It also helps control your blood pressure.  Follow a healthy diet. The goal is to prevent bone damage and diabetes. Include good sources of protein in your diet. Also, include fruits, vegetables and whole grains. Your caregiver can refer you to an expert on healthy eating (dietitian) for more advice. SEEK MEDICAL CARE IF:   The symptoms that lead to your diagnosis return.  You develop worsening fever, fatigue, headache, weight loss, or pain in your jaw.  You develop signs of infection. Infections can be worse if you are on corticosteroid medication. SEEK IMMEDIATE MEDICAL CARE IF:   Your eyesight changes.  Pain does not go away, even after taking pain medicine.  You feel pain in your chest.  Breathing is difficult.  One side of your face or body suddenly becomes weak or numb.  You develop a fever of more than 102 F (38.9 C). Document Released: 05/07/2009 Document Revised: 10/02/2011 Document Reviewed: 09/03/2013 [facility] Patient Information 2015 [place], [place]. This information is not intended to replace  advice given to you by your health care provider. Make sure you discuss any questions you have with your health care provider.  

## 2014-04-16 NOTE — Progress Notes (Signed)
Reason for visit: Temporal arteritis, memory disorder  Kara [name] is an 68 y.o. female  History of present illness:  Kara [name] is a 68 year old right-handed white female with a history of temporal arteritis. The patient lost her vision in the left eye following cataract surgery, and she has been placed on dexamethasone. The patient has done relatively well with this, and she is slowly tapering down on the medication, currently on 2 mg alternating with 4 mg every other day. The patient had a sedimentation rate recently that was in the upper limits of normal. The patient still has some left-sided headaches, and she is now complaining of some progressive issues with memory. The patient underwent MRI evaluation of the brain in August of 2015, and this showed very minimal small vessel disease. The patient is having short-term memory issues, indicating that she can remember things that happened many years ago without difficulty. She continues to have problems with insomnia. She has some difficulty with blurring of vision in the right eye, and eventually she will need cataract surgery on the right eye. She returns to this office for an evaluation. She is on oxygen at home.  Past Medical History  Diagnosis Date  . Type 2 diabetes mellitus   . Arthritis   . COPD (chronic obstructive pulmonary disease)     Home oxygen  . Palpitations     Reports history of atrial fibrillation, although none seen recently  . Coronary atherosclerosis of native coronary artery     Nonobstructive at catheterization 2008  . GERD (gastroesophageal reflux disease)   . Irritable bowel syndrome   . Chronic anemia   . Hypothyroidism   . Hyperlipidemia   . Chronic edema   . Pneumonia   . Diabetes mellitus   . Hypertension   . Chronic pain   . Anemia 08/04/2011  . Anemia of chronic disease 08/04/2011  . Bulging lumbar disc     3  . Headache(784.0) 07/30/2013  . Sprain of neck 07/30/2013  . Asthma   . Visual loss,  left eye 09/12/2013  . Renal insufficiency 08/07/2011  . Claustrophobia   . Temporal arteritis 10/14/2013  . Memory difficulty 04/16/2014    Past Surgical History  Procedure Laterality Date  . Cholecystectomy    . Appendectomy    . Foot surgery    . Abdominal hysterectomy    . Cataract extraction w/phaco Left 09/01/2013    Procedure: CATARACT EXTRACTION PHACO AND INTRAOCULAR LENS PLACEMENT (IOC);  Surgeon: Gemma Payor, MD;  Location: [facility];  Service: Ophthalmology;  Laterality: Left;  CDE:  14.13  . Artery biopsy Left 09/22/2013    Procedure: BIOPSY TEMPORAL ARTERY;  Surgeon: Mariella Saa, MD;  Location: [facility] OR;  Service: General;  Laterality: Left;    Family History  Problem Relation Age of Onset  . Hypertension    . Diabetes Brother   . Migraines Brother     Social history:  reports that she quit smoking about 18 years ago. Her smoking use included Cigarettes. She has a 48 pack-year smoking history. She has never used smokeless tobacco. She reports that she does not drink alcohol or use illicit drugs.    Allergies  Allergen Reactions  . Prednisone     Can have in small doses  . Codeine Nausea And Vomiting and Rash    Medications:  Current Outpatient Prescriptions on File Prior to Visit  Medication Sig Dispense Refill  . acetaminophen (TYLENOL) 325 MG tablet Take 650  mg by mouth every 6 (six) hours as needed for mild pain.       [name] ALPRAZolam (XANAX) 1 MG tablet Take 1 mg by mouth 3 (three) times daily as needed for anxiety.       . cholecalciferol (VITAMIN D) 1000 UNITS tablet Take 1,000 Units by mouth daily.      . citalopram (CELEXA) 20 MG tablet Take 20 mg by mouth 2 (two) times daily.       [name] dexamethasone (DECADRON) 2 MG tablet One tablet on odd days, two tablets on even days  60 tablet  3  . fenofibrate 160 MG tablet Take 160 mg by mouth at bedtime.      . furosemide (LASIX) 80 MG tablet Take 40 mg by mouth daily.       [name] levothyroxine (SYNTHROID, LEVOTHROID) 100  MCG tablet Take 100 mcg by mouth daily.        . Linaclotide (LINZESS) 145 MCG CAPS capsule Take 1 capsule (145 mcg total) by mouth daily.  30 capsule  5  . metFORMIN (GLUCOPHAGE) 1000 MG tablet Take 1,000 mg by mouth 2 (two) times daily with a meal.       . metoprolol succinate (TOPROL-XL) 50 MG 24 hr tablet Please take 1.5 tablet ( ) daily  60 tablet  6  . morphine (MS CONTIN) 30 MG 12 hr tablet Take 30 mg by mouth 2 (two) times daily. To treat pain associated with 3 bulging disc in back      . nitroGLYCERIN (NITROSTAT) 0.4 MG SL tablet Place 0.4 mg under the tongue every 5 (five) minutes as needed for chest pain.       . pantoprazole (PROTONIX) 40 MG tablet Take 40 mg by mouth 2 (two) times daily.      . Potassium Chloride Crys CR (KLOR-CON M20 PO) Take 20 mEq by mouth daily.       . vitamin B-12 (CYANOCOBALAMIN) 1000 MCG tablet Take 1,000 mcg by mouth daily.      [name] zolpidem (AMBIEN CR) 12.5 MG CR tablet Take 1 tablet (12.5 mg total) by mouth at bedtime as needed for sleep.  30 tablet  1  . clotrimazole-betamethasone (LOTRISONE) cream Apply 1 application topically 2 (two) times daily. Apply to ear       No current facility-administered medications on file prior to visit.    ROS:  Out of a complete 14 system review of symptoms, the patient complains only of the following symptoms, and all other reviewed systems are negative.  Headache Shortness of breath Memory disorder Insomnia  Blood pressure 100/70, pulse 64, weight 162 lb (73.483 kg).  Physical Exam  General: The patient is alert and cooperative at the time of the examination. Moon facies are seen.  Skin: 2+ edema of ankles is noted bilaterally.   Neurologic Exam  Mental status: The Mini-Mental status examination done today shows a total score of 24/30.  Cranial nerves: Facial symmetry is present. Speech is normal, no aphasia or dysarthria is noted. Extraocular movements are full. Visual fields are full with the right  eye., the patient has no vision in the left eye.  Motor: The patient has good strength in all 4 extremities.  Sensory examination: Soft sensation is symmetric on the face, arms, and legs.  Coordination: The patient has good finger-nose-finger and heel-to-shin bilaterally.  Gait and station: The patient has a normal gait. Tandem gait is unsteady. Romberg is negative. No drift is seen.  Reflexes: Deep tendon reflexes are  symmetric, but are depressed.   MRI brain 03/05/14:  IMPRESSION:  1. No acute intracranial abnormality.  2. Progressed signal changes since 2007, but still mild for age, are  most compatible with chronic small vessel disease.    Assessment/Plan:  1. Temporal arteritis  2. Memory disorder  3. Blindness, OS  The patient will stay on the current dose of dexamethasone, and will have a recheck on her sedimentation rate sometime within the next 2 months. The patient will be placed on low-dose Aricept. She will call me if she has problems on this medication. She will followup in about 4 months. Blood work will be done today.  Marlan Palau MD 04/16/2014 8:36 PM  [facility] [address] [place], [place] [address]  Phone [phone] Fax [phone]

## 2014-04-20 ENCOUNTER — Other Ambulatory Visit: Payer: Self-pay | Admitting: Neurology

## 2014-04-20 NOTE — Telephone Encounter (Signed)
Dr Willis is out of the office, forwarding request to W[facility] for review.   

## 2014-04-21 NOTE — Telephone Encounter (Signed)
Rx signed and faxed.

## 2014-04-24 ENCOUNTER — Telehealth: Payer: Self-pay | Admitting: Neurology

## 2014-04-24 DIAGNOSIS — R51 Headache: Principal | ICD-10-CM

## 2014-04-24 DIAGNOSIS — M316 Other giant cell arteritis: Secondary | ICD-10-CM

## 2014-04-24 DIAGNOSIS — R519 Headache, unspecified: Secondary | ICD-10-CM

## 2014-04-24 MED ORDER — GABAPENTIN 100 MG PO CAPS: 100.0000 mg | ORAL_CAPSULE | Freq: Three times a day (TID) | ORAL | Status: DC

## 2014-04-24 NOTE — Telephone Encounter (Signed)
I called the patient. The patient is begun having increasing problems with her headaches. The patient is having daily headaches. I'll start gabapentin. The patient is concerned about her memory, but she refuses to take the Aricept. We may try Namenda in the future. Given the increase in the headache, and a recent decrease in his steroid dose, I will check a another sedimentation rate.

## 2014-04-24 NOTE — Telephone Encounter (Signed)
Patient stated she's still experiencing headaches all day, very weak, and very concerned with Memory.  Please call and advise.  Call mobile # [phone].

## 2014-04-24 NOTE — Telephone Encounter (Signed)
I called patient at home, no answer. The cell phone number was busy. I will call back later.

## 2014-04-24 NOTE — Telephone Encounter (Signed)
Called patient about message that she left about her headaches and memory problems, offered patient to come in for an appointment, patient refused, patient would like Dr Anne HahnWillis to call her back about this, she states that since she has been on steroid medication that her memory and headaches have gotten worse, please advise,

## 2014-04-27 ENCOUNTER — Telehealth: Payer: Self-pay | Admitting: Neurology

## 2014-04-27 NOTE — Telephone Encounter (Signed)
See phone note

## 2014-04-27 NOTE — Telephone Encounter (Signed)
Gave patient Dr. Teofilo PodAthar's suggestion, also let her know [name] will address 04/28/14.

## 2014-04-27 NOTE — Telephone Encounter (Signed)
I called the patient. OK to try to come off of the ambien to see if the headaches improve.

## 2014-04-27 NOTE — Telephone Encounter (Signed)
Patient is calling because she has severe headaches and states a pharmacist says Ambien has a side effect of headaches. Does she need to discontinue Ambien? Please call patient.

## 2014-04-27 NOTE — Telephone Encounter (Signed)
See phone note: Kara HahnWillis patient

## 2014-04-27 NOTE — Telephone Encounter (Signed)
If she thinks HAs are [place], may need to stop it.

## 2014-04-30 ENCOUNTER — Telehealth: Payer: Self-pay | Admitting: Neurology

## 2014-04-30 NOTE — Telephone Encounter (Signed)
Patient calling to check if she can have [place], please return call and advise.

## 2014-04-30 NOTE — Telephone Encounter (Signed)
I called patient. I have no problem with her primary care physician doing the blood work if he is willing to do this. I would ask that he would send me the report of the sedimentation rate.

## 2014-04-30 NOTE — Telephone Encounter (Signed)
See phone note

## 2014-05-01 ENCOUNTER — Encounter: Payer: Self-pay | Admitting: Cardiology

## 2014-05-01 ENCOUNTER — Ambulatory Visit (INDEPENDENT_AMBULATORY_CARE_PROVIDER_SITE_OTHER): Payer: Medicare HMO | Admitting: Cardiology

## 2014-05-01 VITALS — BP 134/78 | HR 79 | Ht 66.0 in | Wt 172.0 lb

## 2014-05-01 DIAGNOSIS — I429 Cardiomyopathy, unspecified: Secondary | ICD-10-CM

## 2014-05-01 DIAGNOSIS — N289 Disorder of kidney and ureter, unspecified: Secondary | ICD-10-CM

## 2014-05-01 DIAGNOSIS — I2510 Atherosclerotic heart disease of native coronary artery without angina pectoris: Secondary | ICD-10-CM

## 2014-05-01 DIAGNOSIS — R002 Palpitations: Secondary | ICD-10-CM

## 2014-05-01 DIAGNOSIS — I10 Essential (primary) hypertension: Secondary | ICD-10-CM

## 2014-05-01 MED ORDER — METOPROLOL SUCCINATE ER 100 MG PO TB24: 100.0000 mg | ORAL_TABLET | Freq: Every day | ORAL | Status: DC

## 2014-05-01 NOTE — Assessment & Plan Note (Signed)
Increasing beta blocker, Toprol-XL 100 mg daily.

## 2014-05-01 NOTE — Assessment & Plan Note (Signed)
Creatinine improved to 0.9 by recent testing.

## 2014-05-01 NOTE — Patient Instructions (Signed)
Your physician recommends that you schedule a follow-up appointment in: 3 months     Your physician has recommended you make the following change in your medication:      INCREASE Toprol XL to 100 mg daily     Your physician has requested that you have an echocardiogram. Echocardiography is a painless test that uses sound waves to create images of your heart. It provides your doctor with information about the size and shape of your heart and how well your heart's chambers and valves are working. This procedure takes approximately one hour. There are no restrictions for this procedure.          Thank you for choosing [facility] !

## 2014-05-01 NOTE — Progress Notes (Signed)
Clinical Summary [name] is a 68 y.o.female last seen by Ms. Lawrence NP in March. Recent followup with Neurology noted. She has had vision loss, diagnosis of temporal arteritis.  From a cardiac perspective, she does report recurrent palpitations. States that she gets assistance with her medications, on metoprolol 75 mg once a day. No chest pain.  Lab work per Dr. [name] recently showed hemoglobin 8.1, platelets 172, potassium 4.6, BUN 25, creatinine 0.9, hemoglobin A1c 6.6.  Carotid Dopplers from February of this year showed 50-69% RICA stenosis, and less than 50% LICA stenosis.   Echocardiogram in January showed moderate LVH with LVEF approximately 40%, basal inferolateral hypokinesis, grade 1 diastolic dysfunction, mild left atrial enlargement, MAC with mild mitral regurgitation, PASP 37 mm of mercury, small posterior pericardial effusion, mildly dilated aortic root with calcification (no dissection by chest CTA). LexIscan Cardiolite also in January reported LVEF 33% with inferoseptal scar, no ischemia.   Allergies  Allergen Reactions  . Prednisone     Can have in small doses  . Codeine Nausea And Vomiting and Rash    Current Outpatient Prescriptions  Medication Sig Dispense Refill  . acetaminophen (TYLENOL) 325 MG tablet Take 650 mg by mouth every 6 (six) hours as needed for mild pain.       [name]. ALPRAZolam (XANAX) 1 MG tablet Take 1 mg by mouth 3 (three) times daily as needed for anxiety.       . cholecalciferol (VITAMIN D) 1000 UNITS tablet Take 1,000 Units by mouth daily.      . citalopram (CELEXA) 20 MG tablet Take 20 mg by mouth 2 (two) times daily.       . clotrimazole-betamethasone (LOTRISONE) cream Apply 1 application topically 2 (two) times daily. Apply to ear      . dexamethasone (DECADRON) 2 MG tablet One tablet on odd days, two tablets on even days  60 tablet  3  . fenofibrate 160 MG tablet Take 160 mg by mouth at bedtime.      . furosemide (LASIX) 80 MG tablet Take  40 mg by mouth daily.       [name]. gabapentin (NEURONTIN) 100 MG capsule Take 1 capsule (100 mg total) by mouth 3 (three) times daily.  90 capsule  2  . levothyroxine (SYNTHROID, LEVOTHROID) 100 MCG tablet Take 100 mcg by mouth daily.        . Linaclotide (LINZESS) 145 MCG CAPS capsule Take 1 capsule (145 mcg total) by mouth daily.  30 capsule  5  . metFORMIN (GLUCOPHAGE) 1000 MG tablet Take 1,000 mg by mouth 2 (two) times daily with a meal.       . morphine (MS CONTIN) 30 MG 12 hr tablet Take 30 mg by mouth 2 (two) times daily. To treat pain associated with 3 bulging disc in back      . nitroGLYCERIN (NITROSTAT) 0.4 MG SL tablet Place 0.4 mg under the tongue every 5 (five) minutes as needed for chest pain.       . pantoprazole (PROTONIX) 40 MG tablet Take 40 mg by mouth 2 (two) times daily.      . Potassium Chloride Crys CR (KLOR-CON M20 PO) Take 20 mEq by mouth daily.       . vitamin B-12 (CYANOCOBALAMIN) 1000 MCG tablet Take 1,000 mcg by mouth daily.      [name]. zolpidem (AMBIEN CR) 12.5 MG CR tablet TAKE ONE TABLET AT BEDTIME AS NEEDED FORSLEEP  30 tablet  1  . metoprolol succinate (  TOPROL-XL) 100 MG 24 hr tablet Take 1 tablet (100 mg total) by mouth daily. Take with or immediately following a meal.  90 tablet  3   No current facility-administered medications for this visit.    Past Medical History  Diagnosis Date  . Type 2 diabetes mellitus   . Arthritis   . COPD (chronic obstructive pulmonary disease)     Home oxygen  . Palpitations     Reports history of atrial fibrillation, although none seen recently  . Coronary atherosclerosis of native coronary artery     Nonobstructive at catheterization 2008  . GERD (gastroesophageal reflux disease)   . Irritable bowel syndrome   . Hypothyroidism   . Hyperlipidemia   . Chronic edema   . Pneumonia   . Essential hypertension   . Chronic pain   . Anemia of chronic disease   . Bulging lumbar disc     3  . Headache(784.0)   . Sprain of neck   .  Asthma   . Visual loss, left eye   . CKD (chronic kidney disease), stage II   . Claustrophobia   . Temporal arteritis   . Memory difficulty     Social History [name] reports that she quit smoking about 18 years ago. Her smoking use included Cigarettes. She has a 48 pack-year smoking history. She has never used smokeless tobacco. [name] reports that she does not drink alcohol.  Review of Systems Uses a cane to ambulate. Denies falls. Having trouble sleeping. Also intermittent abdominal pains. Other systems reviewed and negative.  Physical Examination Filed Vitals:   05/01/14 1114  BP: 134/78  Pulse: 79   Filed Weights   05/01/14 1114  Weight: 172 lb (78.019 kg)   Overweight woman, appears comfortable at rest. HEENT: Conjunctiva and lids normal, oropharynx clear. Wearing glasses. Neck: Supple, no elevated JVP left carotid bruit, no thyromegaly. Lungs: Decreased breath sounds, nonlabored breathing at rest. Cardiac: Regular rate and rhythm, no S3 soft systolic murmur, no pericardial rub. Abdomen: Soft, nontender, bowel sounds present, no guarding or rebound. Extremities: Trace edema, distal pulses 2+. Skin: Warm and dry. Musculoskeletal: No kyphosis. Neuropsychiatric: Alert and oriented x3, affect grossly appropriate.   Problem List and Plan   Coronary atherosclerosis of native coronary artery History of nonobstructive disease as of 2008, although with more recent Cardiolite study earlier in the year showing evidence of inferoseptal scar and reduced LVEF. By echocardiogram, LVEF was approximately 40% in January. I reviewed these results with her today. Plan is to followup with an echocardiogram, increase beta blocker particularly in the setting of palpitations. Will  need to optimize medications depending on her echocardiogram results.  Palpitations Increasing beta blocker, Toprol-XL 100 mg daily.  Renal insufficiency Creatinine improved to 0.9 by recent  testing.  Essential hypertension, benign Medication medications now, might consider an ACE inhibitor or ARB after review of her echocardiogram.    Jonelle SidleSamuel G. McDowell, M.D., F.A.C.C.

## 2014-05-01 NOTE — Assessment & Plan Note (Signed)
Medication medications now, might consider an ACE inhibitor or ARB after review of her echocardiogram.

## 2014-05-01 NOTE — Assessment & Plan Note (Signed)
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 By echocardiogram, LVEF was approximately 40% in January. I reviewed these results with her today. Plan is to followup with an echocardiogram, increase beta blocker particularly in the setting of palpitations. Will  need to optimize medications depending on her echocardiogram results.

## 2014-05-04 ENCOUNTER — Telehealth: Payer: Self-pay | Admitting: Neurology

## 2014-05-04 NOTE — Telephone Encounter (Signed)
Patient stated steroid medication is making her lose her memory and questioning what to to?  Please call anytime and leave detailed message on voicemail.

## 2014-05-04 NOTE — Telephone Encounter (Signed)
I called patient. I do not think that the steroids are the cause of her memory issues. The patient was placed on Aricept and she was seen in office, if she does well on the 5 mg dose, she is to contact our office at the end of this month to go up to a 10 mg tablet.

## 2014-05-05 ENCOUNTER — Ambulatory Visit: Payer: Medicare Other | Admitting: Neurology

## 2014-05-05 ENCOUNTER — Telehealth: Payer: Self-pay | Admitting: Neurology

## 2014-05-05 NOTE — Telephone Encounter (Signed)
I called patient. The sedimentation rate was 12. She will remain on the current dose of steroids, 4 mg alternating with 2 mg.

## 2014-05-08 ENCOUNTER — Ambulatory Visit (HOSPITAL_COMMUNITY)
Admission: RE | Admit: 2014-05-08 | Discharge: 2014-05-08 | Disposition: A | Discharge status: Home / Self Care | Payer: Medicare HMO | Source: Ambulatory Visit | Attending: Cardiology | Admitting: Cardiology

## 2014-05-08 DIAGNOSIS — I10 Essential (primary) hypertension: Secondary | ICD-10-CM | POA: Diagnosis not present

## 2014-05-08 DIAGNOSIS — I369 Nonrheumatic tricuspid valve disorder, unspecified: Secondary | ICD-10-CM

## 2014-05-08 DIAGNOSIS — J449 Chronic obstructive pulmonary disease, unspecified: Secondary | ICD-10-CM | POA: Diagnosis not present

## 2014-05-08 DIAGNOSIS — I429 Cardiomyopathy, unspecified: Secondary | ICD-10-CM

## 2014-05-08 DIAGNOSIS — E119 Type 2 diabetes mellitus without complications: Secondary | ICD-10-CM | POA: Diagnosis not present

## 2014-05-08 DIAGNOSIS — I2510 Atherosclerotic heart disease of native coronary artery without angina pectoris: Secondary | ICD-10-CM | POA: Diagnosis present

## 2014-05-08 DIAGNOSIS — Z87891 Personal history of nicotine dependence: Secondary | ICD-10-CM | POA: Insufficient documentation

## 2014-05-08 DIAGNOSIS — I083 Combined rheumatic disorders of mitral, aortic and tricuspid valves: Secondary | ICD-10-CM | POA: Insufficient documentation

## 2014-05-08 NOTE — Progress Notes (Signed)
  Echocardiogram 2D Echocardiogram has been performed.  Heatherly Stenner 05/08/2014, 2:59 PM

## 2014-05-11 ENCOUNTER — Telehealth: Payer: Self-pay | Admitting: Neurology

## 2014-05-11 NOTE — Telephone Encounter (Signed)
I called Dr. Cathey EndowBowen. The patient has done well on Decadron, her sedimentation rate when last checked was 12. No contraindications to having cataract surgery.

## 2014-05-11 NOTE — Telephone Encounter (Signed)
Dr. Cathey EndowBowen from [facility] calling to speak with [name] regarding patient, states that it is not urgent, please return call and advise.

## 2014-06-02 ENCOUNTER — Telehealth: Payer: Self-pay | Admitting: Neurology

## 2014-06-02 MED ORDER — DEXAMETHASONE 1 MG PO TABS: ORAL_TABLET | ORAL | Status: DC

## 2014-06-02 NOTE — Telephone Encounter (Signed)
I called the patient. The patient indicates that she continues to have headaches, but this has been an issue for her since November 2014. The last sedimentation rate 3 weeks ago was 12. We will reduce the Decadron to 3 mg alternating with 2 mg every other day. I will call in a prescription for the 1 mg Decadron tablets.

## 2014-06-02 NOTE — Telephone Encounter (Signed)
Patient calling to state that her steroids are still causing her to have "prominent headaches" and she states that she cannot tolerate it anymore, please return call and advise.

## 2014-06-10 ENCOUNTER — Encounter: Payer: Self-pay | Admitting: Neurology

## 2014-06-10 ENCOUNTER — Telehealth: Payer: Self-pay | Admitting: Neurology

## 2014-06-10 MED ORDER — TEMAZEPAM 15 MG PO CAPS: 15.0000 mg | ORAL_CAPSULE | Freq: Every evening | ORAL | Status: DC | PRN

## 2014-06-10 NOTE — Telephone Encounter (Signed)
Patient requesting change of Rx zolpidem (AMBIEN CR) 12.5 MG CR tablet due to sleeping for about 3 hours and then wide awake.  Also effecting memory, would like to try Resporil 15 mg.   Patient stated Aricept 5 mg isn't helping with Memory, questioning if dosage could be increased.  Please call and advise.

## 2014-06-10 NOTE — Telephone Encounter (Signed)
I called the patient. The patient indicates that the Ambien CR is not helpful, I'll be happy to try low-dose Restoril.

## 2014-06-16 ENCOUNTER — Telehealth: Payer: Self-pay | Admitting: Neurology

## 2014-06-16 ENCOUNTER — Encounter: Payer: Self-pay | Admitting: Neurology

## 2014-06-16 NOTE — Telephone Encounter (Signed)
Patient calling to state that she is having an injection in her back on Friday and wants to know if she's on any medication that may interfere, please return call and advise.

## 2014-06-16 NOTE — Telephone Encounter (Signed)
Left message to relay that the office doing the injection should be looking at her medications before the procedure and asked that she speak to them.  I also relayed that I would forward to [name] for his opinion.

## 2014-06-16 NOTE — Telephone Encounter (Signed)
I called the patient. The patient is seeing Dr. Ethelene Halamos, and will be getting some injections in the hip. The patient is already on steroids, the patient wants to check if there is any interaction with these injections and the dexamethasone. The dexamethasone does have systemic effect as an anti-inflammatory.

## 2014-06-22 ENCOUNTER — Ambulatory Visit (HOSPITAL_COMMUNITY)
Admission: RE | Admit: 2014-06-22 | Discharge: 2014-06-22 | Disposition: A | Discharge status: Home / Self Care | Payer: Medicare HMO | Source: Ambulatory Visit | Attending: General Surgery | Admitting: General Surgery

## 2014-06-22 ENCOUNTER — Other Ambulatory Visit (HOSPITAL_COMMUNITY): Payer: Self-pay | Admitting: General Surgery

## 2014-06-22 DIAGNOSIS — T148XXA Other injury of unspecified body region, initial encounter: Secondary | ICD-10-CM

## 2014-06-22 DIAGNOSIS — L539 Erythematous condition, unspecified: Secondary | ICD-10-CM | POA: Diagnosis present

## 2014-06-22 DIAGNOSIS — M7989 Other specified soft tissue disorders: Secondary | ICD-10-CM | POA: Insufficient documentation

## 2014-06-23 NOTE — Consult Note (Signed)
NAMNance Pew:  Thobe, Elgene              ACCOUNT NO.:  [id number]  MEDICAL RECORD NO.:  [id number]  LOCATION:  RAD                           FACILITY:  [facility]  PHYSICIAN:  Barbaraann BarthelWilliam Donesha Wallander, M.D. DATE OF BIRTH:  1945-11-25  DATE OF CONSULTATION:  06/22/2014 DATE OF DISCHARGE:  06/22/2014                                CONSULTATION   Dear Dr. [name]:  I saw Ms. Hirschhorn in my office on June 22, 2014.  As you know, she has approximately a week history of being scratched by her dog in her right pretibial area.  This has caused quite an area of open wound, swelling, and cellulitis.  It is approximately 10 x 4 cm in diameter. We debrided this sharply with a curette in the office and applied topical Silvadene to this and will follow up with Wound Care.  I also placed her on doxycycline.  I understand that you may have written that prescription for her yourself, I was unsure, but I will make sure that she is placed on antibiotics right away.  We will also get an x-ray of her right pretibial area to make sure there is no osteomyelitis or any problems of that nature.  We have made followup arrangements for wound care, and I will keep you informed of any acute surgical changes.     Barbaraann BarthelWilliam Mattox Schorr, M.D.     WB/MEDQ  D:  06/22/2014  T:  06/23/2014  Job:  [id number]

## 2014-06-30 ENCOUNTER — Other Ambulatory Visit: Payer: Self-pay | Admitting: Neurology

## 2014-06-30 ENCOUNTER — Telehealth: Payer: Self-pay | Admitting: Neurology

## 2014-06-30 NOTE — Telephone Encounter (Signed)
I called the patient. The patient indicates that she is still having side effects from the dexamethasone. She will have a sedimentation rate drawn, if this is unremarkable, we will lower the dexamethasone dose to 2 mg daily.

## 2014-06-30 NOTE — Telephone Encounter (Signed)
Patient stated side effects from steroids are getting worse.  She's questioning if dosage should be decreased or medication should be discontinued.  Requesting a return call before your last patient today.  Please call caregivers # first @ [phone] and home # second [phone].

## 2014-06-30 NOTE — Telephone Encounter (Signed)
Rx signed and faxed.

## 2014-07-13 ENCOUNTER — Ambulatory Visit (INDEPENDENT_AMBULATORY_CARE_PROVIDER_SITE_OTHER): Payer: Medicare HMO | Admitting: Internal Medicine

## 2014-07-13 ENCOUNTER — Other Ambulatory Visit: Payer: Self-pay | Admitting: Neurology

## 2014-07-13 ENCOUNTER — Encounter (INDEPENDENT_AMBULATORY_CARE_PROVIDER_SITE_OTHER): Payer: Self-pay | Admitting: Internal Medicine

## 2014-07-13 VITALS — BP 118/70 | HR 66 | Temp 99.2°F | Resp 16 | Ht 66.0 in | Wt 166.7 lb

## 2014-07-13 DIAGNOSIS — K5901 Slow transit constipation: Secondary | ICD-10-CM

## 2014-07-13 MED ORDER — DOCUSATE SODIUM 100 MG PO CAPS: 200.0000 mg | ORAL_CAPSULE | Freq: Every day | ORAL | Status: AC

## 2014-07-13 MED ORDER — LINACLOTIDE 145 MCG PO CAPS: 290.0000 ug | ORAL_CAPSULE | Freq: Every day | ORAL | Status: DC

## 2014-07-13 NOTE — Telephone Encounter (Signed)
Tried to contact patient to no avail.  Sending one refill to ensure she is not without medication over the holiday.

## 2014-07-13 NOTE — Patient Instructions (Addendum)
Continue high fiber diet. Fleet enema once or twice today. Can use Dulcolax suppository or Fleet enema every third day as needed. Stool diary as to consistency and frequency of stools until office visit in two months

## 2014-07-13 NOTE — Progress Notes (Signed)
Presenting complaint;  Follow-up for chronic constipation.   Subjective:  Patient is 68 year old Caucasian female with multiple medical problems including O2 dependent COPD who is here for scheduled visit. She was last seen in September 2014. She says medication is not working anymore and she is having hard time having bowel movement. Last BM was 4 days ago and was not good evacuation. Also complains of fullness and pain in left lower quadrant but she denies fever or chills. She has had nausea but no vomiting. Her weight is down by 3 pounds since her last visit 15 months ago. She denies melena or rectal bleeding. She believes she is getting enough fiber in her diet since she eats vegetative every day. Recently she took MiraLAX as well as Radio broadcast assistantleet Phospho-Soda and passed 2 pieces of hard stool. She lives at home. She says she is unable to strain because she gets rectal pain. She has chronic back pain. She has disc prolapse at multiple levels. Her caretaker is Pam who could not comment her today. Patient states heartburn is well controlled with therapy. Patient states she lost vision in left eye in February this year after she had cataract surgery. 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 She has some vision in right eye. She is able to read large print and watch TV. Patient's last colonoscopy was in March 2005 revealing left-sided diverticulosis and external hemorrhoids.   Current Medications: Outpatient Encounter Prescriptions as of 07/13/2014  Medication Sig  . acetaminophen (TYLENOL) 325 MG tablet Take 650 mg by mouth every 6 (six) hours as needed for mild pain.   [name]. ALPRAZolam (XANAX) 1 MG tablet Take 1 mg by mouth 3 (three) times daily as needed for anxiety.   . cholecalciferol (VITAMIN D) 1000 UNITS tablet Take 1,000 Units by mouth daily.  . citalopram (CELEXA) 20 MG tablet Take 20 mg by mouth 2 (two) times daily.   . clotrimazole-betamethasone (LOTRISONE) cream Apply 1 application  topically 2 (two) times daily. Apply to ear  . dexamethasone (DECADRON) 1 MG tablet Take 3 tablets alternating with 2 tablets every other day  . fenofibrate 160 MG tablet Take 160 mg by mouth at bedtime.  . furosemide (LASIX) 80 MG tablet Take 40 mg by mouth daily.   [name]. gabapentin (NEURONTIN) 100 MG capsule Take 1 capsule (100 mg total) by mouth 3 (three) times daily.  [name]. levothyroxine (SYNTHROID, LEVOTHROID) 100 MCG tablet Take 100 mcg by mouth daily.    . Linaclotide (LINZESS) 145 MCG CAPS capsule Take 1 capsule (145 mcg total) by mouth daily.  . metFORMIN (GLUCOPHAGE) 1000 MG tablet Take 1,000 mg by mouth 2 (two) times daily with a meal.   . metoprolol succinate (TOPROL-XL) 100 MG 24 hr tablet Take 1 tablet (100 mg total) by mouth daily. Take with or immediately following a meal.  . morphine (MS CONTIN) 30 MG 12 hr tablet Take 30 mg by mouth 2 (two) times daily. To treat pain associated with 3 bulging disc in back  . pantoprazole (PROTONIX) 40 MG tablet Take 40 mg by mouth 2 (two) times daily.  . Potassium Chloride Crys CR (KLOR-CON M20 PO) Take 20 mEq by mouth daily.   . temazepam (RESTORIL) 15 MG capsule TAKE 1 CAPSULE AT BEDTIME AS NEEDED FOR SLEEP  . nitroGLYCERIN (NITROSTAT) 0.4 MG SL tablet Place 0.4 mg under the tongue every 5 (five) minutes as needed for chest pain.   . vitamin B-12 (CYANOCOBALAMIN) 1000 MCG tablet Take 1,000 mcg by  mouth daily.     Objective: Blood pressure 118/70, pulse 66, temperature 99.2 F (37.3 C), temperature source Oral, resp. rate 16, height 5\' 6"  (1.676 m), weight 166 lb 11.2 oz (75.615 kg). Patient is alert and in no acute distress. She appears to be chronically ill. Conjunctiva is pink. Sclera is nonicteric Oropharyngeal mucosa is normal. No neck masses or thyromegaly noted. Cardiac exam with regular rhythm normal S1 and S2. No murmur or gallop noted. Breast sounds are diminished over both lungs posteriorly. Abdomen is full. Bowel sounds are  hyperactive. On palpation abdomen is soft with mild tenderness and LLQ with fullness. No organomegaly or masses. Rectal examination reveals soft impaction which was further broken up digitally. Stool is guaiac negative.  No LE edema or clubbing noted.    Assessment:  #1. Chronic constipation felt to be secondary to medications. Linzess at current dose is not working. He does not have alarm symptoms and stool is guaiac negative. Last colonoscopy was in March 2005. She does not have alarm symptoms. It may be difficult if not impossible to prep her for colonoscopy.   Plan:  Increase Linzess to 290 mcg by mouth every morning. 2 week supply given along with new prescription. Continue high fiber diet. Colace 200 mg by mouth daily at bedtime. Fleet enema once or twice today. Use Dulcolax suppository or Fleet's enema every third day as needed. Patient advised to keep stool diary until office visit in 2 months.

## 2014-07-14 ENCOUNTER — Emergency Department (HOSPITAL_COMMUNITY)
Admission: EM | Admit: 2014-07-14 | Discharge: 2014-07-14 | Disposition: A | Discharge status: Home / Self Care | Payer: Medicare HMO | Attending: Emergency Medicine | Admitting: Emergency Medicine

## 2014-07-14 ENCOUNTER — Emergency Department (HOSPITAL_COMMUNITY): Payer: Medicare HMO

## 2014-07-14 ENCOUNTER — Encounter (HOSPITAL_COMMUNITY): Payer: Self-pay | Admitting: Emergency Medicine

## 2014-07-14 ENCOUNTER — Telehealth (INDEPENDENT_AMBULATORY_CARE_PROVIDER_SITE_OTHER): Payer: Self-pay | Admitting: *Deleted

## 2014-07-14 ENCOUNTER — Telehealth: Payer: Self-pay | Admitting: Neurology

## 2014-07-14 DIAGNOSIS — Z9049 Acquired absence of other specified parts of digestive tract: Secondary | ICD-10-CM | POA: Insufficient documentation

## 2014-07-14 DIAGNOSIS — J449 Chronic obstructive pulmonary disease, unspecified: Secondary | ICD-10-CM | POA: Insufficient documentation

## 2014-07-14 DIAGNOSIS — Z87891 Personal history of nicotine dependence: Secondary | ICD-10-CM | POA: Insufficient documentation

## 2014-07-14 DIAGNOSIS — K219 Gastro-esophageal reflux disease without esophagitis: Secondary | ICD-10-CM | POA: Insufficient documentation

## 2014-07-14 DIAGNOSIS — Z7952 Long term (current) use of systemic steroids: Secondary | ICD-10-CM | POA: Diagnosis not present

## 2014-07-14 DIAGNOSIS — Z8701 Personal history of pneumonia (recurrent): Secondary | ICD-10-CM | POA: Diagnosis not present

## 2014-07-14 DIAGNOSIS — E785 Hyperlipidemia, unspecified: Secondary | ICD-10-CM | POA: Insufficient documentation

## 2014-07-14 DIAGNOSIS — M1990 Unspecified osteoarthritis, unspecified site: Secondary | ICD-10-CM | POA: Insufficient documentation

## 2014-07-14 DIAGNOSIS — Z8659 Personal history of other mental and behavioral disorders: Secondary | ICD-10-CM | POA: Diagnosis not present

## 2014-07-14 DIAGNOSIS — Z87828 Personal history of other (healed) physical injury and trauma: Secondary | ICD-10-CM | POA: Insufficient documentation

## 2014-07-14 DIAGNOSIS — G8929 Other chronic pain: Secondary | ICD-10-CM | POA: Insufficient documentation

## 2014-07-14 DIAGNOSIS — E119 Type 2 diabetes mellitus without complications: Secondary | ICD-10-CM | POA: Insufficient documentation

## 2014-07-14 DIAGNOSIS — I129 Hypertensive chronic kidney disease with stage 1 through stage 4 chronic kidney disease, or unspecified chronic kidney disease: Secondary | ICD-10-CM | POA: Insufficient documentation

## 2014-07-14 DIAGNOSIS — Z8739 Personal history of other diseases of the musculoskeletal system and connective tissue: Secondary | ICD-10-CM | POA: Insufficient documentation

## 2014-07-14 DIAGNOSIS — Z79899 Other long term (current) drug therapy: Secondary | ICD-10-CM | POA: Insufficient documentation

## 2014-07-14 DIAGNOSIS — E039 Hypothyroidism, unspecified: Secondary | ICD-10-CM | POA: Diagnosis not present

## 2014-07-14 DIAGNOSIS — N182 Chronic kidney disease, stage 2 (mild): Secondary | ICD-10-CM | POA: Diagnosis not present

## 2014-07-14 DIAGNOSIS — Z862 Personal history of diseases of the blood and blood-forming organs and certain disorders involving the immune mechanism: Secondary | ICD-10-CM | POA: Insufficient documentation

## 2014-07-14 DIAGNOSIS — R195 Other fecal abnormalities: Secondary | ICD-10-CM

## 2014-07-14 DIAGNOSIS — R109 Unspecified abdominal pain: Secondary | ICD-10-CM | POA: Diagnosis present

## 2014-07-14 DIAGNOSIS — I2510 Atherosclerotic heart disease of native coronary artery without angina pectoris: Secondary | ICD-10-CM | POA: Diagnosis not present

## 2014-07-14 DIAGNOSIS — K6289 Other specified diseases of anus and rectum: Secondary | ICD-10-CM | POA: Diagnosis not present

## 2014-07-14 LAB — CBC WITH DIFFERENTIAL/PLATELET
Basophils Absolute: 0 10*3/uL (ref 0.0–0.1)
Basophils Relative: 0 % (ref 0–1)
Eosinophils Absolute: 0.1 10*3/uL (ref 0.0–0.7)
Eosinophils Relative: 1 % (ref 0–5)
HEMATOCRIT: 28.4 % — AB (ref 36.0–46.0)
HEMOGLOBIN: 8.9 g/dL — AB (ref 12.0–15.0)
LYMPHS ABS: 1.9 10*3/uL (ref 0.7–4.0)
Lymphocytes Relative: 18 % (ref 12–46)
MCH: 31.1 pg (ref 26.0–34.0)
MCHC: 31.3 g/dL (ref 30.0–36.0)
MCV: 99.3 fL (ref 78.0–100.0)
MONOS PCT: 10 % (ref 3–12)
Monocytes Absolute: 1 10*3/uL (ref 0.1–1.0)
NEUTROS ABS: 7.7 10*3/uL (ref 1.7–7.7)
NEUTROS PCT: 71 % (ref 43–77)
Platelets: 192 10*3/uL (ref 150–400)
RBC: 2.86 MIL/uL — AB (ref 3.87–5.11)
RDW: 13.5 % (ref 11.5–15.5)
WBC: 10.7 10*3/uL — ABNORMAL HIGH (ref 4.0–10.5)

## 2014-07-14 LAB — COMPREHENSIVE METABOLIC PANEL
ALBUMIN: 3.3 g/dL — AB (ref 3.5–5.2)
ALT: 14 U/L (ref 0–35)
ANION GAP: 9 (ref 5–15)
AST: 22 U/L (ref 0–37)
Alkaline Phosphatase: 36 U/L — ABNORMAL LOW (ref 39–117)
BUN: 40 mg/dL — AB (ref 6–23)
CO2: 35 mmol/L — ABNORMAL HIGH (ref 19–32)
CREATININE: 1.21 mg/dL — AB (ref 0.50–1.10)
Calcium: 8.7 mg/dL (ref 8.4–10.5)
Chloride: 93 mEq/L — ABNORMAL LOW (ref 96–112)
GFR calc Af Amer: 52 mL/min — ABNORMAL LOW (ref >90)
GFR calc non Af Amer: 45 mL/min — ABNORMAL LOW (ref >90)
Glucose, Bld: 99 mg/dL (ref 70–99)
Potassium: 3.8 mmol/L (ref 3.5–5.1)
Sodium: 137 mmol/L (ref 135–145)
TOTAL PROTEIN: 6.1 g/dL (ref 6.0–8.3)
Total Bilirubin: 0.7 mg/dL (ref 0.3–1.2)

## 2014-07-14 LAB — LIPASE, BLOOD: LIPASE: 19 U/L (ref 11–59)

## 2014-07-14 MED ORDER — IOHEXOL 300 MG/ML  SOLN
80.0000 mL | Freq: Once | INTRAMUSCULAR | Status: AC | PRN
  Administered 2014-07-14: 80 mL via INTRAVENOUS

## 2014-07-14 MED ORDER — TRAMADOL HCL 50 MG PO TABS
50.0000 mg | ORAL_TABLET | Freq: Once | ORAL | Status: AC
  Administered 2014-07-14: 50 mg via ORAL
  Filled 2014-07-14: qty 1

## 2014-07-14 MED ORDER — TRAMADOL HCL 50 MG PO TABS: 50.0000 mg | ORAL_TABLET | Freq: Four times a day (QID) | ORAL | Status: DC | PRN

## 2014-07-14 MED ORDER — IOHEXOL 300 MG/ML  SOLN
50.0000 mL | Freq: Once | INTRAMUSCULAR | Status: AC | PRN
  Administered 2014-07-14: 50 mL via ORAL

## 2014-07-14 MED ORDER — SODIUM CHLORIDE 0.9 % IV SOLN: INTRAVENOUS | Status: DC

## 2014-07-14 MED ORDER — ONDANSETRON HCL 4 MG/2ML IJ SOLN
4.0000 mg | Freq: Once | INTRAMUSCULAR | Status: AC
  Administered 2014-07-14: 4 mg via INTRAVENOUS
  Filled 2014-07-14: qty 2

## 2014-07-14 MED ORDER — SODIUM CHLORIDE 0.9 % IV BOLUS (SEPSIS)
250.0000 mL | Freq: Once | INTRAVENOUS | Status: AC
Start: 2014-07-14 — End: 2014-07-14
  Administered 2014-07-14: 250 mL via INTRAVENOUS

## 2014-07-14 NOTE — Telephone Encounter (Signed)
Left message that we have not received lab work from Dr. [name] MansMcGinnis office, and asked her to have them refax labs.  Dr. Clarisa KindredWillis's phone note from 06-30-14 says that if her Sedimentation rate is unremarkable he will lower steroid dose.  [facility].

## 2014-07-14 NOTE — ED Notes (Signed)
Pt seen by Dr. Gerilyn Nestleamon yesterday, has had episodes of diaarhea followed by constipation. Pt states she has been unable to eat or drink since yesterday. Pt reports having diarrhea if she eats but then has "blockages."

## 2014-07-14 NOTE — Telephone Encounter (Signed)
A fax should be here from Dr. [name] regarding lab work.  Pt wants to know if she can decrease steroids due to lab work.  Please call and advise.  You may leave a message if there is no answer.

## 2014-07-14 NOTE — Telephone Encounter (Signed)
No labs yet. Advise to continue current prednisone until Monday when [name] returns. -VRP

## 2014-07-14 NOTE — Telephone Encounter (Signed)
Left message that she is to continue the current dose of steroids until we receive labs and will get back to her when [name] returns on Monday.

## 2014-07-14 NOTE — Telephone Encounter (Signed)
She tells me she is constipated.  She took a fleets enema today and supp last night. She is nauseated. She hasn't been able to eat or drink. She is drinking some diet mountain dew now. She feels dehydrated. She says she feels a hard stool in her rectum. She is having some diarrhea (oozing). It sounds like she has an impaction. She's something is not right. She says her abdomen is swollen.  She says she is very weak.

## 2014-07-14 NOTE — Discharge Instructions (Signed)
CT scan shows no evidence of constipation. Shows evidence of inflammation in the rectal area. Take pain medicine as directed. Get GI medicine to call in the morning for further follow-up based on the CT findings. Return for any new or worse symptom. It would be okay to continue the Colace.

## 2014-07-14 NOTE — Telephone Encounter (Signed)
I have advised her to go to the ED. I will call the ED. I spoke with Francena Hanlyhresa Waddell in the ED and she will tell Darel HongJudy the charge nurse. Patient has an impaction

## 2014-07-14 NOTE — Telephone Encounter (Signed)
Has already been addressed by Ms. Setzer. Patient was advised to go to emergency room.

## 2014-07-14 NOTE — Telephone Encounter (Signed)
Kara [name] is having trouble with her bowles. She is messing on herself before she realizes it. Having nausea, has not eaten or drank today and hard feeling of something that needs to come out but will not. When over all instructions you gave her. Kara [name] said she used an enema today and took a pill this morning but could not tell me which one. Her return phone number is [phone].

## 2014-07-14 NOTE — ED Provider Notes (Addendum)
CSN: [id number]     Arrival date & time 07/14/14  1738 History   First MD Initiated Contact with Patient 07/14/14 1929     Chief Complaint  Patient presents with  . Abdominal Pain     (Consider location/radiation/quality/duration/timing/severity/associated sxs/prior Treatment) Patient is a 68 y.o. female presenting with abdominal pain. The history is provided by the patient.  Abdominal Pain Associated symptoms: nausea and shortness of breath   Associated symptoms: no chest pain, no dysuria and no fever    patient followed by Dr.Rehman from GI medicine. Patient's been feeling constipated for several days. Had rectal exam done by him in the office yesterday. Felt that there was a little bit of an impaction. He broke that up but not read did not remove any stool. Sent home with Colace and enemas. Patient with some small bowel movements but still does not feel any relief. Patient feels like she's constipated and cannot get the stool out. Associated with some mild discomfort but most discomfort is in the rectal area. Some mild nausea no vomiting. Patient has some mild shortness of breath is baseline. She is normally on 3 L of oxygen at all times. Patient is also on Aricept. In addition past medical history is significant for as stated the COPD and irritable bowel.  Past Medical History  Diagnosis Date  . Type 2 diabetes mellitus   . Arthritis   . COPD (chronic obstructive pulmonary disease)     Home oxygen  . Palpitations     Reports history of atrial fibrillation, although none seen recently  . Coronary atherosclerosis of native coronary artery     Nonobstructive at catheterization 2008  . GERD (gastroesophageal reflux disease)   . Irritable bowel syndrome   . Hypothyroidism   . Hyperlipidemia   . Chronic edema   . Pneumonia   . Essential hypertension   . Chronic pain   . Anemia of chronic disease   . Bulging lumbar disc     3  . Headache(784.0)   . Sprain of neck   . Asthma   .  Visual loss, left eye   . CKD (chronic kidney disease), stage II   . Claustrophobia   . Temporal arteritis   . Memory difficulty    Past Surgical History  Procedure Laterality Date  . Cholecystectomy    . Appendectomy    . Foot surgery    . Abdominal hysterectomy    . Cataract extraction w/phaco Left 09/01/2013    Procedure: CATARACT EXTRACTION PHACO AND INTRAOCULAR LENS PLACEMENT (IOC);  Surgeon: Gemma Payor, MD;  Location: [facility];  Service: Ophthalmology;  Laterality: Left;  CDE:  14.13  . Artery biopsy Left 09/22/2013    Procedure: BIOPSY TEMPORAL ARTERY;  Surgeon: Mariella Saa, MD;  Location: [facility];  Service: General;  Laterality: Left;   Family History  Problem Relation Age of Onset  . Hypertension    . Diabetes Brother   . Migraines Brother    History  Substance Use Topics  . Smoking status: Former Smoker -- 1.00 packs/day for 48 years    Types: Cigarettes    Quit date: 12/28/1995  . Smokeless tobacco: Never Used  . Alcohol Use: No   OB History    Gravida Para Term Preterm AB TAB SAB Ectopic Multiple Living   1 1 1             Review of Systems  Constitutional: Negative for fever.  HENT: Negative for congestion.   Eyes:  Positive for visual disturbance. Negative for redness.       Patient has visual loss in the left eye.  Respiratory: Positive for shortness of breath.   Cardiovascular: Negative for chest pain.  Gastrointestinal: Positive for nausea and abdominal pain. Negative for blood in stool.       Constipation  Genitourinary: Negative for dysuria.  Musculoskeletal: Negative for back pain.  Skin: Negative for rash.  Neurological: Negative for headaches.  Hematological: Does not bruise/bleed easily.  Psychiatric/Behavioral: Negative for confusion.      Allergies  Prednisone and Codeine  Home Medications   Prior to Admission medications   Medication Sig Start Date End Date Taking? Authorizing Provider  acetaminophen (TYLENOL) 325 MG tablet Take  650 mg by mouth every 6 (six) hours as needed for mild pain.     Historical Provider, MD  ALPRAZolam Prudy Feeler) 1 MG tablet Take 1 mg by mouth 3 (three) times daily as needed for anxiety.     Historical Provider, MD  cholecalciferol (VITAMIN D) 1000 UNITS tablet Take 1,000 Units by mouth daily.    Historical Provider, MD  citalopram (CELEXA) 20 MG tablet Take 20 mg by mouth 2 (two) times daily.     Historical Provider, MD  clotrimazole-betamethasone (LOTRISONE) cream Apply 1 application topically 2 (two) times daily. Apply to ear    Historical Provider, MD  dexamethasone (DECADRON) 1 MG tablet Take 3 tablets alternating with 2 tablets every other day 06/02/14   York Spaniel, MD  docusate sodium (COLACE) 100 MG capsule Take 2 capsules (200 mg total) by mouth daily. 07/13/14   Malissa Hippo, MD  donepezil (ARICEPT) 5 MG tablet TAKE ONE TABLET BY MOUTH EVERY NIGHT AT BEDTIME 07/13/14   York Spaniel, MD  fenofibrate 160 MG tablet Take 160 mg by mouth at bedtime.    Historical Provider, MD  furosemide (LASIX) 80 MG tablet Take 40 mg by mouth daily.     Historical Provider, MD  gabapentin (NEURONTIN) 100 MG capsule Take 1 capsule (100 mg total) by mouth 3 (three) times daily. 04/24/14   York Spaniel, MD  levothyroxine (SYNTHROID, LEVOTHROID) 100 MCG tablet Take 100 mcg by mouth daily.      Historical Provider, MD  Linaclotide Karlene Einstein) 145 MCG CAPS capsule Take 2 capsules (290 mcg total) by mouth daily. 07/13/14   Malissa Hippo, MD  metFORMIN (GLUCOPHAGE) 1000 MG tablet Take 1,000 mg by mouth 2 (two) times daily with a meal.     Historical Provider, MD  metoprolol succinate (TOPROL-XL) 100 MG 24 hr tablet Take 1 tablet (100 mg total) by mouth daily. Take with or immediately following a meal. 05/01/14   Jonelle Sidle, MD  morphine (MS CONTIN) 30 MG 12 hr tablet Take 30 mg by mouth 2 (two) times daily. To treat pain associated with 3 bulging disc in back    Historical Provider, MD   nitroGLYCERIN (NITROSTAT) 0.4 MG SL tablet Place 0.4 mg under the tongue every 5 (five) minutes as needed for chest pain.     Historical Provider, MD  pantoprazole (PROTONIX) 40 MG tablet Take 40 mg by mouth 2 (two) times daily.    Historical Provider, MD  Potassium Chloride Crys CR (KLOR-CON M20 PO) Take 20 mEq by mouth daily.     Historical Provider, MD  temazepam (RESTORIL) 15 MG capsule TAKE 1 CAPSULE AT BEDTIME AS NEEDED FOR SLEEP 06/30/14   York Spaniel, MD  traMADol (ULTRAM) 50 MG tablet Take 1  tablet (50 mg total) by mouth every 6 (six) hours as needed. 07/14/14   Vanetta MuldersScott Lucian Baswell, MD  vitamin B-12 (CYANOCOBALAMIN) 1000 MCG tablet Take 1,000 mcg by mouth daily.    Historical Provider, MD   BP 114/46 mmHg  Pulse 73  Temp(Src) 98.7 F (37.1 C) (Oral)  Resp 18  Ht 5' 6.5" (1.689 m)  Wt 166 lb 11.2 oz (75.615 kg)  BMI 26.51 kg/m2  SpO2 99% Physical Exam  Constitutional: She is oriented to person, place, and time. She appears well-developed and well-nourished. No distress.  HENT:  Head: Normocephalic and atraumatic.  Mouth/Throat: Oropharynx is clear and moist.  Eyes: Conjunctivae and EOM are normal.  Pre-existing visual loss left eye.  Neck: Normal range of motion.  Cardiovascular: Normal rate, regular rhythm and normal heart sounds.   No murmur heard. Pulmonary/Chest: Effort normal and breath sounds normal. No respiratory distress.  Abdominal: Soft. Bowel sounds are normal. There is no tenderness.  Genitourinary: Guaiac positive stool.  On rectal examination no external hemorrhoids no prolapsed internal hemorrhoids. No fissure. No tenderness perianal area. On rectal exam rectal vault is the void of any stool or some liquid brown stool there. No gross blood was heme positive.  Musculoskeletal: Normal range of motion.  Neurological: She is alert and oriented to person, place, and time. No cranial nerve deficit. She exhibits normal muscle tone. Coordination normal.  Skin: Skin  is warm. No rash noted.  Nursing note and vitals reviewed.   ED Course  Procedures (including critical care time) Labs Review Labs Reviewed  COMPREHENSIVE METABOLIC PANEL - Abnormal; Notable for the following:    Chloride 93 (*)    CO2 35 (*)    BUN 40 (*)    Creatinine, Ser 1.21 (*)    Albumin 3.3 (*)    Alkaline Phosphatase 36 (*)    GFR calc non Af Amer 45 (*)    GFR calc Af Amer 52 (*)    All other components within normal limits  CBC WITH DIFFERENTIAL - Abnormal; Notable for the following:    WBC 10.7 (*)    RBC 2.86 (*)    Hemoglobin 8.9 (*)    HCT 28.4 (*)    All other components within normal limits  LIPASE, BLOOD   Results for orders placed or performed during the hospital encounter of 07/14/14  Comprehensive metabolic panel  Result Value Ref Range   Sodium 137 135 - 145 mmol/L   Potassium 3.8 3.5 - 5.1 mmol/L   Chloride 93 (L) 96 - 112 mEq/L   CO2 35 (H) 19 - 32 mmol/L   Glucose, Bld 99 70 - 99 mg/dL   BUN 40 (H) 6 - 23 mg/dL   Creatinine, Ser 4.091.21 (H) 0.50 - 1.10 mg/dL   Calcium 8.7 8.4 - 81.110.5 mg/dL   Total Protein 6.1 6.0 - 8.3 g/dL   Albumin 3.3 (L) 3.5 - 5.2 g/dL   AST 22 0 - 37 U/L   ALT 14 0 - 35 U/L   Alkaline Phosphatase 36 (L) 39 - 117 U/L   Total Bilirubin 0.7 0.3 - 1.2 mg/dL   GFR calc non Af Amer 45 (L) >90 mL/min   GFR calc Af Amer 52 (L) >90 mL/min   Anion gap 9 5 - 15  Lipase, blood  Result Value Ref Range   Lipase 19 11 - 59 U/L  CBC with Differential  Result Value Ref Range   WBC 10.7 (H) 4.0 - 10.5 K/uL  RBC 2.86 (L) 3.87 - 5.11 MIL/uL   Hemoglobin 8.9 (L) 12.0 - 15.0 g/dL   HCT 09.628.4 (L) 04.536.0 - 40.946.0 %   MCV 99.3 78.0 - 100.0 fL   MCH 31.1 26.0 - 34.0 pg   MCHC 31.3 30.0 - 36.0 g/dL   RDW 81.113.5 91.411.5 - 78.215.5 %   Platelets 192 150 - 400 K/uL   Neutrophils Relative % 71 43 - 77 %   Neutro Abs 7.7 1.7 - 7.7 K/uL   Lymphocytes Relative 18 12 - 46 %   Lymphs Abs 1.9 0.7 - 4.0 K/uL   Monocytes Relative 10 3 - 12 %   Monocytes  Absolute 1.0 0.1 - 1.0 K/uL   Eosinophils Relative 1 0 - 5 %   Eosinophils Absolute 0.1 0.0 - 0.7 K/uL   Basophils Relative 0 0 - 1 %   Basophils Absolute 0.0 0.0 - 0.1 K/uL    Imaging Review Ct Abdomen Pelvis W Contrast  07/14/2014   CLINICAL DATA:  Acute onset of diarrhea and sensation of constipation. Unable to eat or drink. Initial encounter.  EXAM: CT ABDOMEN AND PELVIS WITH CONTRAST  TECHNIQUE: Multidetector CT imaging of the abdomen and pelvis was performed using the standard protocol following bolus administration of intravenous contrast.  CONTRAST:  50mL OMNIPAQUE IOHEXOL 300 MG/ML SOLN, 80mL OMNIPAQUE IOHEXOL 300 MG/ML SOLN  COMPARISON:  CT of the abdomen and pelvis performed 12/05/2012, and PET/CT performed 01/03/2013  FINDINGS: The visualized lung bases are clear.  The liver and spleen are unremarkable in appearance. The patient is status post cholecystectomy, with clips noted at the gallbladder fossa. The pancreas and adrenal glands are unremarkable.  There is worsening atrophy of the right kidney. No definite hydronephrosis is seen. A tiny right renal cyst is seen. No renal or ureteral stones are identified. Mild nonspecific right-sided perinephric stranding is noted.  No free fluid is identified. The small bowel is unremarkable in appearance. The stomach is within normal limits. No acute vascular abnormalities are seen. Scattered calcification is seen along the abdominal aorta and its branches.  Note is again made of retroperitoneal soft tissue density surrounding the aortic bifurcation, right common iliac vessels and IVC; this appears stable in size from 2014. 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 Given stability from 2014, malignancy is considered unlikely. Mild intramural thrombus is again noted along the mid abdominal aorta.  The appendix is not definitely seen; there is no evidence for appendicitis. The colon is partially  filled with stool. Scattered diverticulosis is noted along the proximal sigmoid colon, without evidence of diverticulitis.  Note is made of focal soft tissue inflammation and diffuse wall thickening at the level of the rectum, most compatible with proctitis. Associated presacral stranding is seen. This may explain the patient's symptoms.  The bladder is mildly distended and grossly unremarkable. The patient is status post hysterectomy. No suspicious adnexal masses are seen. No inguinal lymphadenopathy is seen.  No acute osseous abnormalities are identified. Vacuum phenomenon is noted at L4-L5.  IMPRESSION: 1. Focal soft tissue inflammation and diffuse wall thickening at the rectum, compatible with acute proctitis. Associated presacral stranding seen. This likely explains the patient's symptoms. After treatment of the acute process, would perform follow-up sigmoidoscopy to exclude an underlying mass. 2. Stable appearance to retroperitoneal soft tissue density surrounding the aortic bifurcation, right common iliac vessels and IVC. 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 3. Scattered calcification along the abdominal aorta and its branches, with mild intramural thrombus at the mid abdominal aorta. No evidence of luminal narrowing. 4. Worsening right renal atrophy noted. Tiny right renal cyst seen. No evidence of recurrence of hydronephrosis. 5. Scattered diverticulosis noted along the proximal sigmoid colon, without evidence of diverticulitis.   Electronically Signed   By: Roanna Raider M.D.   On: 07/14/2014 21:58     EKG Interpretation None      MDM   Final diagnoses:  Rectal pain  Heme positive stool  Proctitis    Patient clinically concerned about having constipation. Patient followed by Dr. Dionicia Abler seen in his office yesterday. Rectal exam was done then he thought that she had a bit of an impaction he broke up some of the stool. She has been  given Colace and enemas without the relief. But she has had some small bowel movements.  On rectal exam here today there is no stool in the vault. There is some brown liquid stool heme positive on guaiac. No mass. No impaction. Based on this CT scan abdomen and pelvis was ordered to evaluate further. Labs without significant abnormalities. Mild leukocytosis. Patient has had a persistent anemia with hemoglobins in the 8 range. Does have GI medicine for follow-up. Patient's abdomen is soft and nontender to palpation. No other rectal abnormalities noted on rectal exam.  CT scan consistent with proctitis. No evidence of any significant constipation. Will treat with mild pain medicine have patient follow-up with her GI medicine doctor in the morning.  Vanetta Mulders, MD 07/14/14 1610  Vanetta Mulders, MD 07/14/14 [id number]  Vanetta Mulders, MD 07/14/14 [id number]  Vanetta Mulders, MD 07/14/14 2212

## 2014-07-20 ENCOUNTER — Telehealth: Payer: Self-pay | Admitting: Neurology

## 2014-07-20 NOTE — Telephone Encounter (Signed)
I called the patient. The sedimentation rate has gone up to 30, upper limits of normal for the lab is 22. She will stay on the current dose of dexamethasone, she is not to reduce the dose.

## 2014-07-20 NOTE — Telephone Encounter (Signed)
Spoke to Dr. Mabeline CarasMcginnis's office and they will relay message to nurse to fax patient's lab results over.

## 2014-07-20 NOTE — Telephone Encounter (Signed)
I called the patient. The patient indicates that she has had blood work done, I have not received the results of this. We will call the office of Dr. [name] MansMcGinnis today. I will call the patient once I have the blood work results.

## 2014-07-22 NOTE — Telephone Encounter (Signed)
Blood work received and forwarded to doctor to advise.

## 2014-07-26 IMAGING — US US CAROTID DUPLEX BILAT
1 series · 13 of 24 positions shown · non-contrast
Comparison: None.

CLINICAL DATA: Headache

EXAM:
BILATERAL CAROTID DUPLEX ULTRASOUND
TECHNIQUE: Gray scale imaging, color Doppler and duplex ultrasound were
performed of bilateral carotid and vertebral arteries in the neck.

[Series 1: us carotid duplex bilat · 0.06mm/px · 13 of 76 slices shown]
[im 1/76]
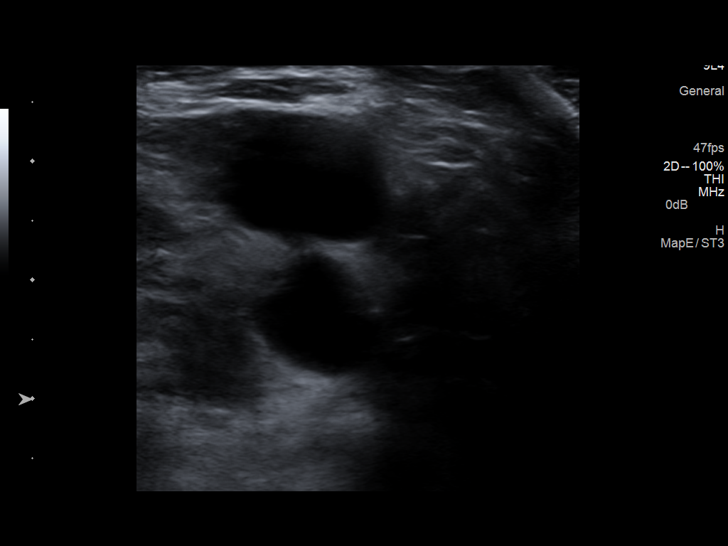
[im 7/76]
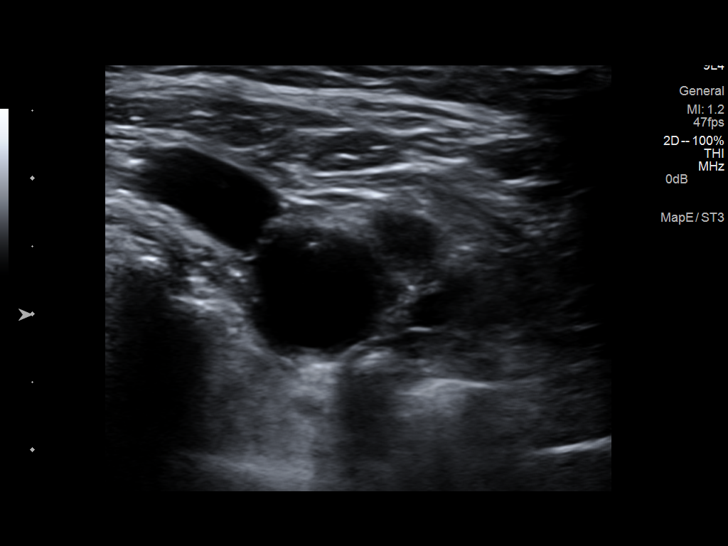
[im 14/76]
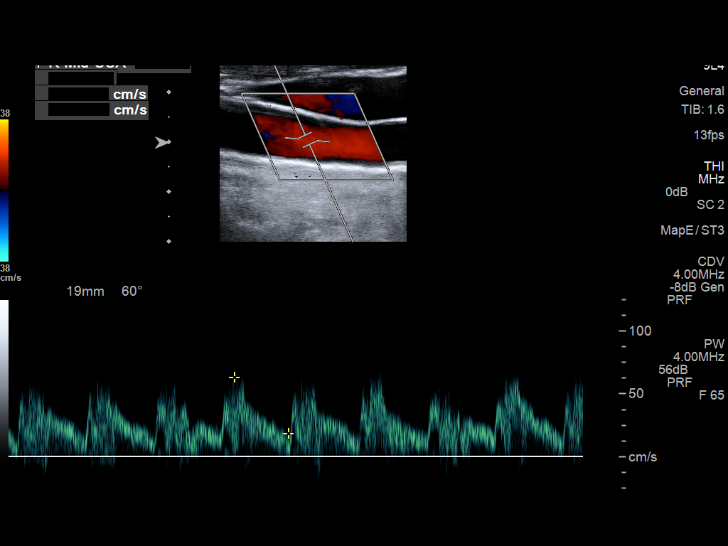
[im 20/76]
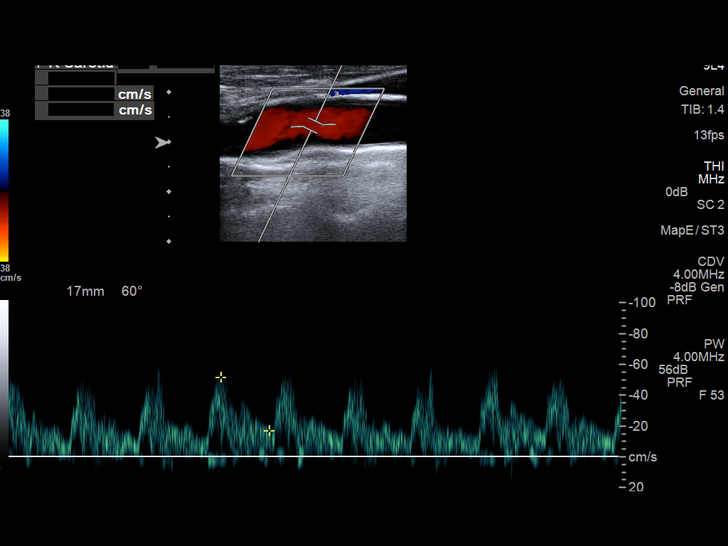
[im 27/76]
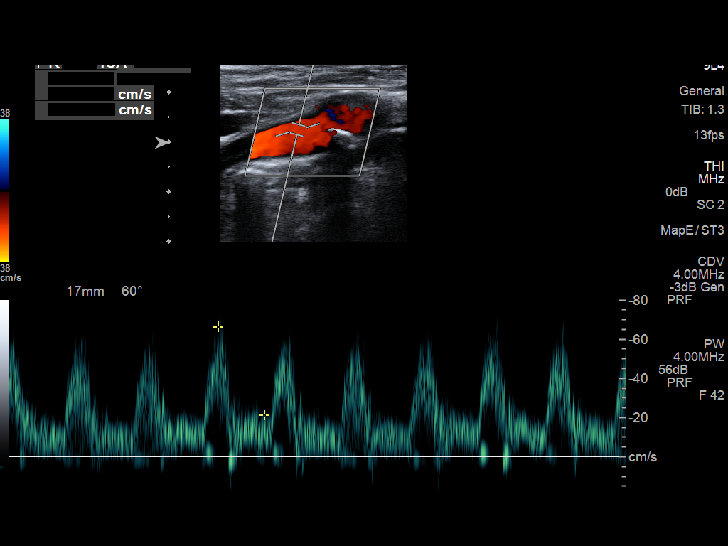
[im 33/76]
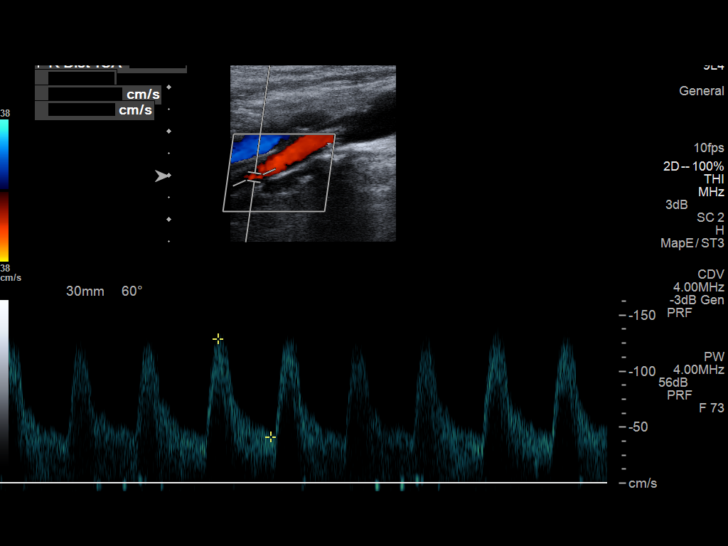
[im 40/76]
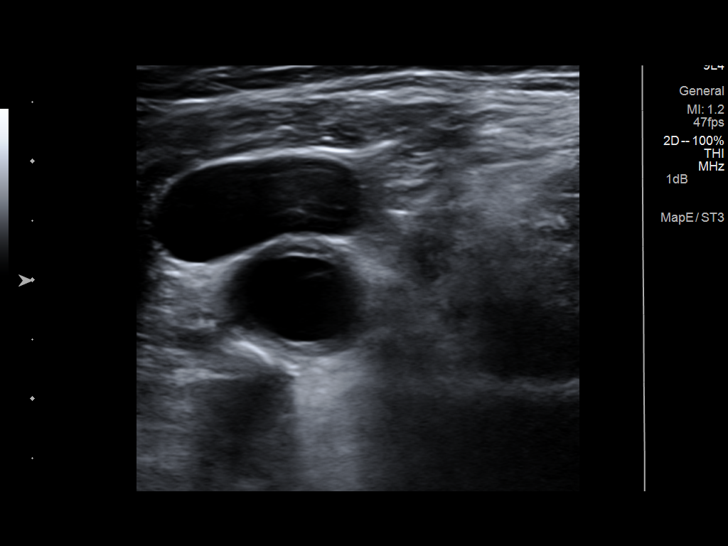
[im 43/76]
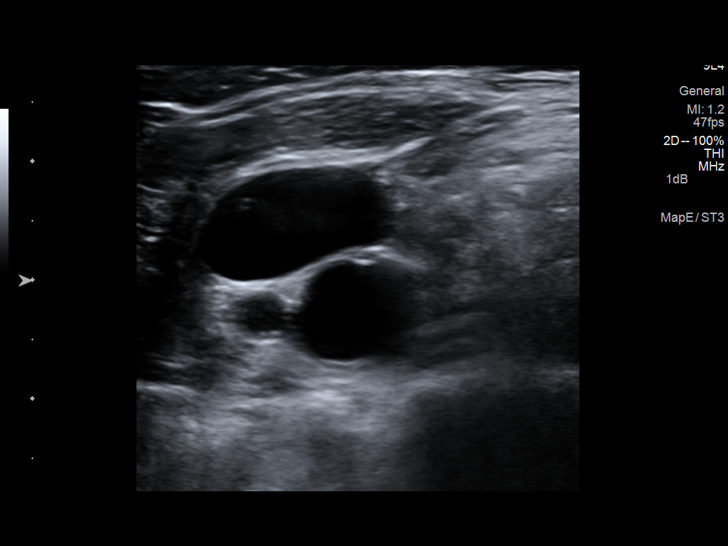
[im 49/76]
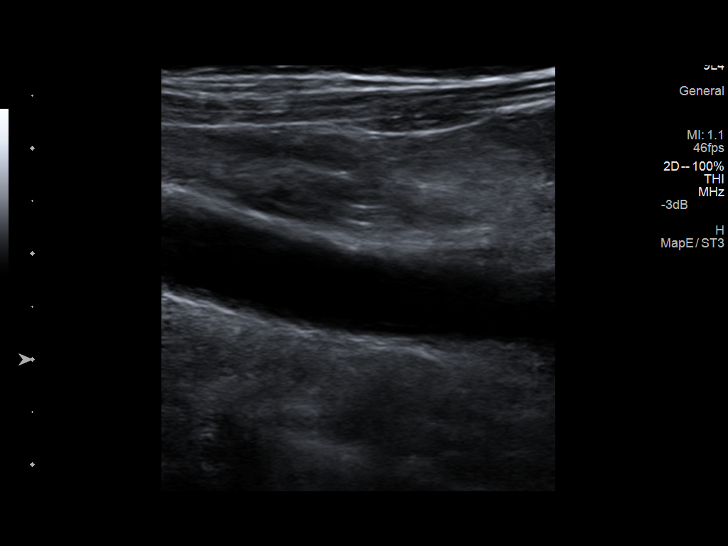
[im 56/76]
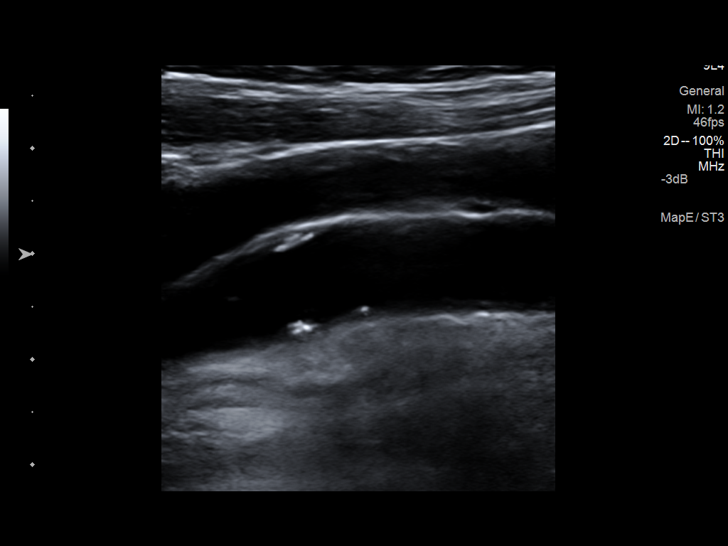
[im 62/76]
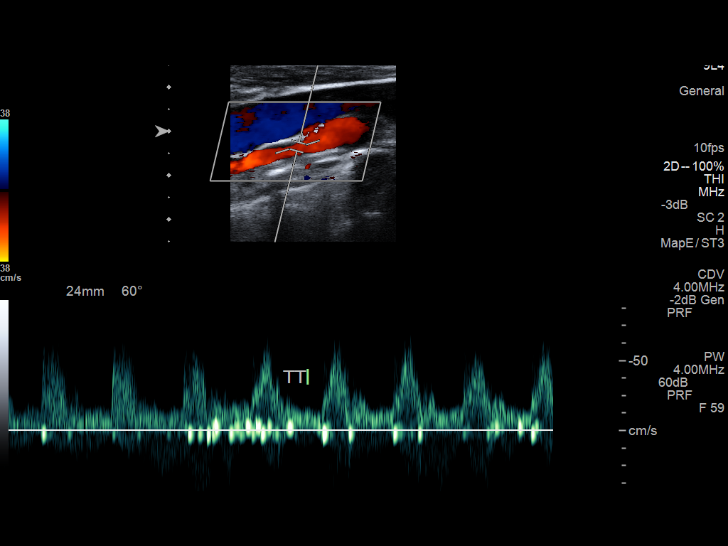
[im 69/76]
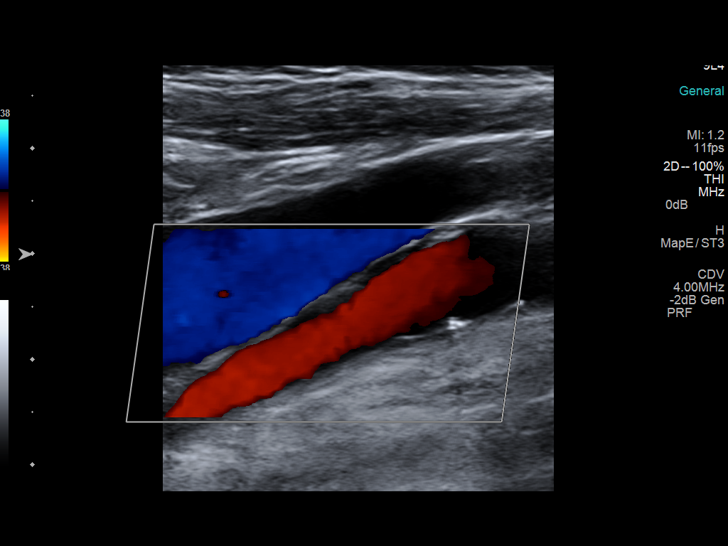
[im 76/76]
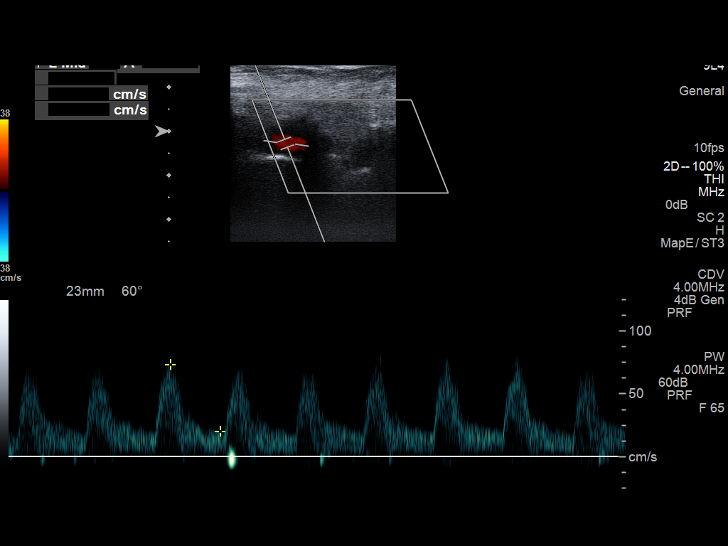

[13 of 24 positions shown; findings below may reference images not displayed]

FINDINGS: Criteria: Quantification of carotid stenosis is based on velocity
parameters that correlate the residual internal carotid diameter
with NASCET-based stenosis levels, using the diameter of the distal
internal carotid lumen as the denominator for stenosis measurement.

The following velocity measurements were obtained:

RIGHT

ICA:  129 cm/sec

CCA:  63 cm/sec

SYSTOLIC ICA/CCA RATIO:

DIASTOLIC ICA/CCA RATIO:

ECA:  95 cm/sec

LEFT

ICA:  98 cm/sec

CCA:  61 cm/sec

SYSTOLIC ICA/CCA RATIO:

DIASTOLIC ICA/CCA RATIO:

ECA:  63 cm/sec

RIGHT CAROTID ARTERY: There is focal calcified plaque in the bulb.
Low resistance internal carotid Doppler pattern.

RIGHT VERTEBRAL ARTERY:  Antegrade.  Normal Doppler pattern.

LEFT CAROTID ARTERY: Mild calcified plaque in the bulb. Low
resistance internal carotid Doppler pattern.

LEFT VERTEBRAL ARTERY:  Antegrade.  Normal Doppler pattern.
IMPRESSION: 50-69% stenosis in the right internal carotid artery.

Less than 50% stenosis in the left internal carotid artery.

## 2014-07-27 ENCOUNTER — Telehealth (INDEPENDENT_AMBULATORY_CARE_PROVIDER_SITE_OTHER): Payer: Self-pay | Admitting: *Deleted

## 2014-07-27 NOTE — Telephone Encounter (Signed)
Her last BM was 3 days ago,. Try Ducolax supp. (She did not want to try a enema)

## 2014-07-27 NOTE — Telephone Encounter (Signed)
Addeline would like something called in to help her go to the bathroom. She is having to strain to hard to even go. The return phone number is [phone] and uses [employer].

## 2014-07-31 ENCOUNTER — Encounter: Payer: Self-pay | Admitting: Neurology

## 2014-08-06 ENCOUNTER — Telehealth: Payer: Self-pay | Admitting: Neurology

## 2014-08-06 MED ORDER — ZOLPIDEM TARTRATE 10 MG PO TABS: 10.0000 mg | ORAL_TABLET | Freq: Every evening | ORAL | Status: AC | PRN

## 2014-08-06 NOTE — Telephone Encounter (Signed)
Patient [facility] Rx for Amb[facility], due to having problems sleeping.  Exhausted when she goes to sleep and awaken every morning at 4 am for several hours before she return back to sleep.  Please call and advise.

## 2014-08-06 NOTE — Telephone Encounter (Signed)
I called the patient. The patient believes that the Ambien works better than the Restoril. I will call in a prescription for the Ambien.

## 2014-08-06 NOTE — Telephone Encounter (Signed)
I called back.  The patient says she is taking Temazepam at bedtime and it does help her fall asleep, but she seems to be waking up around 3am each morning.  She would like to know if the Rx could be changed back to Ambien.  Please advise.  Thank you.

## 2014-08-07 ENCOUNTER — Encounter: Payer: Self-pay | Admitting: Adult Health

## 2014-08-07 ENCOUNTER — Ambulatory Visit (INDEPENDENT_AMBULATORY_CARE_PROVIDER_SITE_OTHER): Payer: Medicare HMO | Admitting: Adult Health

## 2014-08-07 VITALS — BP 126/84 | HR 64 | Ht 66.0 in | Wt 172.0 lb

## 2014-08-07 DIAGNOSIS — I5032 Chronic diastolic (congestive) heart failure: Secondary | ICD-10-CM

## 2014-08-07 DIAGNOSIS — R002 Palpitations: Secondary | ICD-10-CM

## 2014-08-07 NOTE — Patient Instructions (Signed)
Your physician recommends that you schedule a follow-up appointment in: 3 months with Ms.Lawrence NP, or Dr.McDowell       Your physician recommends that you continue on your current medications as directed. Please refer to the Current Medication list given to you today.     AVOID caffeine    Thank you for choosing [facility] !

## 2014-08-07 NOTE — Assessment & Plan Note (Signed)
I was not completed any new cardiac testing.  Can potentially repeat echocardiogram on followup visit.  His other comorbidities.  I do not know that she would be a candidate for interventional therapy at this time.

## 2014-08-07 NOTE — Progress Notes (Signed)
HPI: Kara [name] is a 69 year old female patient of Dr. Diona Browner, that we follow for ongoing assessment and management of recurrent palpitations.  She was last seen by Dr. Diona Browner in October 2015, with this note, demonstrating echocardiogram in January 2015 with LVEF of 40%, a sodium for allowing, hypokinesis, grade 1 diastolic dysfunction, mild left atrial enlargement, MAC, with mild mitral regurg.  Chest small posterior pericardial effusion, mildly dilated.  Aortic root with calcification, but no dissection by chest CTA,.     LexiScan Cardiolite also in January 2015 reported LVEF of 33% with inferior septal scar, and no ischemia.  She comes today at her request secondary to atrial fibrillation.    The patient comes today with complaints of frequent palpitations.  She states there have been mostly at night, where breast and have awakened her at times, lasting anywhere from 15-20 minutes.  The patient will fill in at rest during the day as well, but noticed it most at night.  She is concerned about this and wondering if her medications need to be adjusted.   Her most recent labs were completed in December of 2015, her potassium was normal at 4.5.  Creatinine 1.21.  She is not found to be significantly anemic.  Hemoglobin 10.3.  She is on a good bit of steroids in the setting of chronic COPD with oxygen dependence.         Allergies  Allergen Reactions  . Prednisone     Can have in small doses  . Codeine Nausea And Vomiting and Rash    Current Outpatient Prescriptions  Medication Sig Dispense Refill  . acetaminophen (TYLENOL) 325 MG tablet Take 650 mg by mouth every 6 (six) hours as needed for mild pain.     [name] ALPRAZolam (XANAX) 1 MG tablet Take 1 mg by mouth 3 (three) times daily as needed for anxiety.     . cholecalciferol (VITAMIN D) 1000 UNITS tablet Take 1,000 Units by mouth daily.    . citalopram (CELEXA) 20 MG tablet Take 20 mg by mouth 2 (two) times daily.     .  clotrimazole-betamethasone (LOTRISONE) cream Apply 1 application topically 2 (two) times daily. Apply to ear    . dexamethasone (DECADRON) 1 MG tablet Take 3 tablets alternating with 2 tablets every other day 90 tablet 3  . docusate sodium (COLACE) 100 MG capsule Take 2 capsules (200 mg total) by mouth daily. 10 capsule 0  . donepezil (ARICEPT) 5 MG tablet TAKE ONE TABLET BY MOUTH EVERY NIGHT AT BEDTIME 30 tablet 0  . fenofibrate 160 MG tablet Take 160 mg by mouth at bedtime.    . furosemide (LASIX) 80 MG tablet Take 40 mg by mouth daily.     [name] gabapentin (NEURONTIN) 100 MG capsule Take 1 capsule (100 mg total) by mouth 3 (three) times daily. 90 capsule 2  . levothyroxine (SYNTHROID, LEVOTHROID) 100 MCG tablet Take 100 mcg by mouth daily.      . Linaclotide (LINZESS) 145 MCG CAPS capsule Take 2 capsules (290 mcg total) by mouth daily. 30 capsule 5  . metFORMIN (GLUCOPHAGE) 1000 MG tablet Take 1,000 mg by mouth 2 (two) times daily with a meal.     . metoprolol succinate (TOPROL-XL) 100 MG 24 hr tablet Take 1 tablet (100 mg total) by mouth daily. Take with or immediately following a meal. 90 tablet 3  . morphine (MS CONTIN) 30 MG 12 hr tablet Take 30 mg by mouth 2 (two) times daily. To  treat pain associated with 3 bulging disc in back    . nitroGLYCERIN (NITROSTAT) 0.4 MG SL tablet Place 0.4 mg under the tongue every 5 (five) minutes as needed for chest pain.     . pantoprazole (PROTONIX) 40 MG tablet Take 40 mg by mouth 2 (two) times daily.    . Potassium Chloride Crys CR (KLOR-CON M20 PO) Take 20 mEq by mouth daily.     . traMADol (ULTRAM) 50 MG tablet Take 1 tablet (50 mg total) by mouth every 6 (six) hours as needed. 20 tablet 0  . vitamin B-12 (CYANOCOBALAMIN) 1000 MCG tablet Take 1,000 mcg by mouth daily.    [name]. zolpidem (AMBIEN) 10 MG tablet Take 1 tablet (10 mg total) by mouth at bedtime as needed for sleep. 30 tablet 2   No current facility-administered medications for this visit.    Past  Medical History  Diagnosis Date  . Type 2 diabetes mellitus   . Arthritis   . COPD (chronic obstructive pulmonary disease)     Home oxygen  . Palpitations     Reports history of atrial fibrillation, although none seen recently  . Coronary atherosclerosis of native coronary artery     Nonobstructive at catheterization 2008  . GERD (gastroesophageal reflux disease)   . Irritable bowel syndrome   . Hypothyroidism   . Hyperlipidemia   . Chronic edema   . Pneumonia   . Essential hypertension   . Chronic pain   . Anemia of chronic disease   . Bulging lumbar disc     3  . Headache(784.0)   . Sprain of neck   . Asthma   . Visual loss, left eye   . CKD (chronic kidney disease), stage II   . Claustrophobia   . Temporal arteritis   . Memory difficulty     Past Surgical History  Procedure Laterality Date  . Cholecystectomy    . Appendectomy    . Foot surgery    . Abdominal hysterectomy    . Cataract extraction w/phaco Left 09/01/2013    Procedure: CATARACT EXTRACTION PHACO AND INTRAOCULAR LENS PLACEMENT (IOC);  Surgeon: Gemma PayorKerry Hunt, MD;  Location: [facility];  Service: Ophthalmology;  Laterality: Left;  CDE:  14.13  . Artery biopsy Left 09/22/2013    Procedure: BIOPSY TEMPORAL ARTERY;  Surgeon: Mariella SaaBenjamin T Hoxworth, MD;  Location: [facility];  Service: General;  Laterality: Left;    ROS: Complete review of systems performed and found to be negative unless outlined above  PHYSICAL EXAM BP 126/84 mmHg  Pulse 64  Ht 5\' 6"  (1.676 m)  Wt 172 lb (78.019 kg)  BMI 27.77 kg/m2  SpO2 99% General: Well developed, well nourished, in no acute distress, [place] oxygen. Head: Eyes PERRLA, No xanthomas.   Normal cephalic and atramatic  Lungs:Bilateral crackles in the bases, with no wheezes noted.  She is wearing oxygen. Heart: HRRR S1 S2, heart rate 64 beats per minute, without MRG.  Pulses are 2+ & equal.            No carotid bruit. No JVD.  No abdominal bruits. No femoral bruits. Abdomen: Bowel  sounds are positive, abdomen soft and non-tender without masses or                  Hernia's noted. Msk:  Back normal, normal gait. Normal strength and tone for age. Extremities: No clubbing, cyanosis or edema.  DP +1 Neuro: Alert and oriented X 3. Psych:  Good affect, responds appropriately  EKG:  EKG demonstrates normal sinus rhythm, rate of 62 beats per minute  ASSESSMENT AND PLAN

## 2014-08-07 NOTE — Assessment & Plan Note (Signed)
She states palpitations arebecoming worse.  After going over her medications.  It is noted that she is on by mouth steroids as well as inhalers, which can be contributing to symptoms.  I have also discussed with her her caffeine intake.  She is drinking 2-3 cans of diet [facility] a day.  I have discussed with her the need to go caffeine free, but to do this slowly, as she has gotten use to high amounts of caffeine during the day.  That in conjunction with steroids can cause a ventricular irritability. I answered multiple questions about this with the patient.  She is willing to begin to cutdown.

## 2014-08-07 NOTE — Progress Notes (Deleted)
Name: Kara ShirtsCarolyn W Littlepage    DOB: 09/08/1945  Age: 69 y.o.  MR#: [id number]       PCP:  Alice ReichertMCINNIS,ANGUS G, MD      Insurance: Payor: [employer] MEDICARE / Plan: [employer] MEDICARE HMO/PPO / Product Type: *No Product type* /   CC:    Chief Complaint  Patient presents with  . Palpitations  . Hypertension    VS Filed Vitals:   08/07/14 1425  BP: 126/84  Pulse: 64  Height: 5\' 6"  (1.676 m)  Weight: 172 lb (78.019 kg)  SpO2: 99%    Weights Current Weight  08/07/14 172 lb (78.019 kg)  07/14/14 166 lb 11.2 oz (75.615 kg)  07/13/14 166 lb 11.2 oz (75.615 kg)    Blood Pressure  BP Readings from Last 3 Encounters:  08/07/14 126/84  07/14/14 114/46  07/13/14 118/70     Admit date:  (Not on file) Last encounter with RMR:  Visit date not found   Allergy Prednisone and Codeine  Current Outpatient Prescriptions  Medication Sig Dispense Refill  . acetaminophen (TYLENOL) 325 MG tablet Take 650 mg by mouth every 6 (six) hours as needed for mild pain.     [name]. ALPRAZolam (XANAX) 1 MG tablet Take 1 mg by mouth 3 (three) times daily as needed for anxiety.     . cholecalciferol (VITAMIN D) 1000 UNITS tablet Take 1,000 Units by mouth daily.    . citalopram (CELEXA) 20 MG tablet Take 20 mg by mouth 2 (two) times daily.     . clotrimazole-betamethasone (LOTRISONE) cream Apply 1 application topically 2 (two) times daily. Apply to ear    . dexamethasone (DECADRON) 1 MG tablet Take 3 tablets alternating with 2 tablets every other day 90 tablet 3  . docusate sodium (COLACE) 100 MG capsule Take 2 capsules (200 mg total) by mouth daily. 10 capsule 0  . donepezil (ARICEPT) 5 MG tablet TAKE ONE TABLET BY MOUTH EVERY NIGHT AT BEDTIME 30 tablet 0  . fenofibrate 160 MG tablet Take 160 mg by mouth at bedtime.    . furosemide (LASIX) 80 MG tablet Take 40 mg by mouth daily.     [name]. gabapentin (NEURONTIN) 100 MG capsule Take 1 capsule (100 mg total) by mouth 3 (three) times daily. 90 capsule 2  . levothyroxine (SYNTHROID,  LEVOTHROID) 100 MCG tablet Take 100 mcg by mouth daily.      . Linaclotide (LINZESS) 145 MCG CAPS capsule Take 2 capsules (290 mcg total) by mouth daily. 30 capsule 5  . metFORMIN (GLUCOPHAGE) 1000 MG tablet Take 1,000 mg by mouth 2 (two) times daily with a meal.     . metoprolol succinate (TOPROL-XL) 100 MG 24 hr tablet Take 1 tablet (100 mg total) by mouth daily. Take with or immediately following a meal. 90 tablet 3  . morphine (MS CONTIN) 30 MG 12 hr tablet Take 30 mg by mouth 2 (two) times daily. To treat pain associated with 3 bulging disc in back    . nitroGLYCERIN (NITROSTAT) 0.4 MG SL tablet Place 0.4 mg under the tongue every 5 (five) minutes as needed for chest pain.     . pantoprazole (PROTONIX) 40 MG tablet Take 40 mg by mouth 2 (two) times daily.    . Potassium Chloride Crys CR (KLOR-CON M20 PO) Take 20 mEq by mouth daily.     . traMADol (ULTRAM) 50 MG tablet Take 1 tablet (50 mg total) by mouth every 6 (six) hours as needed. 20 tablet 0  .  vitamin B-12 (CYANOCOBALAMIN) 1000 MCG tablet Take 1,000 mcg by mouth daily.    [name] zolpidem (AMBIEN) 10 MG tablet Take 1 tablet (10 mg total) by mouth at bedtime as needed for sleep. 30 tablet 2   No current facility-administered medications for this visit.    Discontinued Meds:   There are no discontinued medications.  Patient Active Problem List   Diagnosis Date Noted  . Memory difficulty 04/16/2014  . Temporal arteritis 10/14/2013  . 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 Systolic dysfunction 08/22/2013  . Headache(784.0) 07/30/2013  . GERD (gastroesophageal reflux disease) 10/21/2012  . Renal insufficiency 08/07/2011  . Anemia of chronic disease 08/04/2011  . Chronic diastolic heart failure 07/10/2011  . Lower extremity edema 04/13/2011  . COPD (chronic obstructive pulmonary disease) 04/13/2011  . Essential hypertension, benign 04/13/2011  . Coronary atherosclerosis of native coronary artery 04/13/2011  . Hypothyroidism 04/13/2011   . Palpitations 04/13/2011    LABS    Component Value Date/Time   NA 137 07/14/2014 2019   NA 139 12/31/2013 1010   NA 137 10/03/2013 0206   NA 138 10/02/2013 1257   K 3.8 07/14/2014 2019   K 4.2 12/31/2013 1010   K 4.5 10/03/2013 0206   CL 93* 07/14/2014 2019   CL 99 12/31/2013 1010   CL 100 10/03/2013 0206   CO2 35* 07/14/2014 2019   CO2 35* 12/31/2013 1010   CO2 26 10/03/2013 0206   GLUCOSE 99 07/14/2014 2019   GLUCOSE 85 12/31/2013 1010   GLUCOSE 120* 10/03/2013 0206   GLUCOSE 127* 10/02/2013 1257   BUN 40* 07/14/2014 2019   BUN 36* 12/31/2013 1010   BUN 33* 10/03/2013 0206   BUN 38* 10/02/2013 1257   CREATININE 1.21* 07/14/2014 2019   CREATININE 1.05* 12/31/2013 1010   CREATININE 1.17* 10/03/2013 0206   CALCIUM 8.7 07/14/2014 2019   CALCIUM 9.7 12/31/2013 1010   CALCIUM 9.3 10/03/2013 0206   GFRNONAA 45* 07/14/2014 2019   GFRNONAA 55* 12/31/2013 1010   GFRNONAA 47* 10/03/2013 0206   GFRAA 52* 07/14/2014 2019   GFRAA 64 12/31/2013 1010   GFRAA 55* 10/03/2013 0206   CMP     Component Value Date/Time   NA 137 07/14/2014 2019   NA 139 12/31/2013 1010   K 3.8 07/14/2014 2019   CL 93* 07/14/2014 2019   CO2 35* 07/14/2014 2019   GLUCOSE 99 07/14/2014 2019   GLUCOSE 85 12/31/2013 1010   BUN 40* 07/14/2014 2019   BUN 36* 12/31/2013 1010   CREATININE 1.21* 07/14/2014 2019   CALCIUM 8.7 07/14/2014 2019   PROT 6.1 07/14/2014 2019   PROT 5.5* 12/31/2013 1010   ALBUMIN 3.3* 07/14/2014 2019   AST 22 07/14/2014 2019   ALT 14 07/14/2014 2019   ALKPHOS 36* 07/14/2014 2019   BILITOT 0.7 07/14/2014 2019   GFRNONAA 45* 07/14/2014 2019   GFRAA 52* 07/14/2014 2019       Component Value Date/Time   WBC 10.7* 07/14/2014 2019   WBC 9.9 12/31/2013 1010   WBC 5.9 10/03/2013 0206   WBC 6.5 10/02/2013 1257   HGB 8.9* 07/14/2014 2019   HGB 8.8* 12/31/2013 1010   HGB 10.3* 10/03/2013 0206   HCT 28.4* 07/14/2014 2019   HCT 26.8* 12/31/2013 1010   HCT 32.3* 10/03/2013  0206   MCV 99.3 07/14/2014 2019   MCV 96 12/31/2013 1010   MCV 91.8 10/03/2013 0206    Lipid Panel  No results found for: CHOL, TRIG, HDL, CHOLHDL, VLDL,  LDLCALC, LDLDIRECT  ABG    Component Value Date/Time   PHART 7.385 08/24/2012 1400   PCO2ART 62.5* 08/24/2012 1400   PO2ART 84.5 08/24/2012 1400   HCO3 36.6* 08/24/2012 1400   TCO2 34.9 08/24/2012 1400   O2SAT 98.9 08/24/2012 1400     Lab Results  Component Value Date   TSH 1.249 08/19/2013   BNP (last 3 results)  Recent Labs  10/02/13 1257  PROBNP 4681.0*   Cardiac Panel (last 3 results) No results for input(s): CKTOTAL, CKMB, TROPONINI, RELINDX in the last 72 hours.  Iron/TIBC/Ferritin/ %Sat    Component Value Date/Time   IRON 70 12/27/2012 1449   TIBC 386 12/27/2012 1449   FERRITIN 465* 06/09/2013 1425   IRONPCTSAT 18* 12/27/2012 1449     EKG Orders placed or performed in visit on 08/07/14  . EKG 12-Lead     Prior Assessment and Plan Problem List as of 08/07/2014      Cardiovascular and Mediastinum   Temporal arteritis   Essential hypertension, benign   Last Assessment & Plan 05/01/2014 Office Visit Written 05/01/2014 11:53 AM by Jonelle Sidle, MD    Medication medications now, might consider an ACE inhibitor or ARB after review of her echocardiogram.      Coronary atherosclerosis of native coronary artery   Last Assessment & Plan 05/01/2014 Office Visit Written 05/01/2014 11:52 AM by Jonelle Sidle, MD    History of nonobstructive disease as of 2008, although with more recent Cardiolite study earlier in the year showing evidence of inferoseptal scar and reduced LVEF. By echocardiogram, LVEF was approximately 40% in January. I reviewed these results with her today. Plan is to followup with an echocardiogram, increase beta blocker particularly in the setting of palpitations. Will  need to optimize medications depending on her echocardiogram results.      Chronic diastolic heart failure   Last  Assessment & Plan 10/20/2013 Office Visit Written 10/20/2013  3:18 PM by Jodelle Gross, NP    No evidence of fluid retention on this assessment today. Continue current regimen.        Respiratory   COPD (chronic obstructive pulmonary disease)   Last Assessment & Plan 07/10/2011 Office Visit Written 07/10/2011  3:46 PM by Jonelle Sidle, MD    Oxygen requiring, also likely impacts her feelings of shortness of breath.        Digestive   GERD (gastroesophageal reflux disease)     Endocrine   Hypothyroidism   Last Assessment & Plan 04/13/2011 Office Visit Written 04/13/2011  4:15 PM by Jonelle Sidle, MD    Need to followup TSH.        Genitourinary   Renal insufficiency   Last Assessment & Plan 05/01/2014 Office Visit Written 05/01/2014 11:53 AM by Jonelle Sidle, MD    Creatinine improved to 0.9 by recent testing.        Other   Headache(784.0)   Visual loss, left eye   Memory difficulty   Lower extremity edema   Last Assessment & Plan 07/10/2011 Office Visit Written 07/10/2011  3:47 PM by Jonelle Sidle, MD    Much improved.      Palpitations   Last Assessment & Plan 05/01/2014 Office Visit Written 05/01/2014 11:52 AM by Jonelle Sidle, MD    Increasing beta blocker, Toprol-XL 100 mg daily.      Anemia of chronic disease   Systolic dysfunction   Last Assessment & Plan 10/20/2013 Office Visit Written 10/20/2013  3:16 PM by Jodelle Gross, NP    EF of 40% on last evaluation in January of 2015. Will have follow up appt with Dr Diona Browner and decision will be made to repeat with higher dose of BB, in 6 months.          Imaging: Ct Abdomen Pelvis W Contrast  07/14/2014   CLINICAL DATA:  Acute onset of diarrhea and sensation of constipation. Unable to eat or drink. Initial encounter.  EXAM: CT ABDOMEN AND PELVIS WITH CONTRAST  TECHNIQUE: Multidetector CT imaging of the abdomen and pelvis was performed using the standard protocol following bolus  administration of intravenous contrast.  CONTRAST:  50mL OMNIPAQUE IOHEXOL 300 MG/ML SOLN, 80mL OMNIPAQUE IOHEXOL 300 MG/ML SOLN  COMPARISON:  CT of the abdomen and pelvis performed 12/05/2012, and PET/CT performed 01/03/2013  FINDINGS: The visualized lung bases are clear.  The liver and spleen are unremarkable in appearance. The patient is status post cholecystectomy, with clips noted at the gallbladder fossa. The pancreas and adrenal glands are unremarkable.  There is worsening atrophy of the right kidney. No definite hydronephrosis is seen. A tiny right renal cyst is seen. No renal or ureteral stones are identified. Mild nonspecific right-sided perinephric stranding is noted.  No free fluid is identified. The small bowel is unremarkable in appearance. The stomach is within normal limits. No acute vascular abnormalities are seen. Scattered calcification is seen along the abdominal aorta and its branches.  Note is again made of retroperitoneal soft tissue density surrounding the aortic bifurcation, right common iliac vessels and IVC; this appears stable in size from 2014. 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 Given stability from 2014, malignancy is considered unlikely. Mild intramural thrombus is again noted along the mid abdominal aorta.  The appendix is not definitely seen; there is no evidence for appendicitis. The colon is partially filled with stool. Scattered diverticulosis is noted along the proximal sigmoid colon, without evidence of diverticulitis.  Note is made of focal soft tissue inflammation and diffuse wall thickening at the level of the rectum, most compatible with proctitis. Associated presacral stranding is seen. This may explain the patient's symptoms.  The bladder is mildly distended and grossly unremarkable. The patient is status post hysterectomy. No suspicious adnexal masses are seen. No inguinal lymphadenopathy is seen.  No acute  osseous abnormalities are identified. Vacuum phenomenon is noted at L4-L5.  IMPRESSION: 1. Focal soft tissue inflammation and diffuse wall thickening at the rectum, compatible with acute proctitis. Associated presacral stranding seen. This likely explains the patient's symptoms. After treatment of the acute process, would perform follow-up sigmoidoscopy to exclude an underlying mass. 2. Stable appearance to retroperitoneal soft tissue density surrounding the aortic bifurcation, right common iliac vessels and IVC. 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 3. Scattered calcification along the abdominal aorta and its branches, with mild intramural thrombus at the mid abdominal aorta. No evidence of luminal narrowing. 4. Worsening right renal atrophy noted. Tiny right renal cyst seen. No evidence of recurrence of hydronephrosis. 5. Scattered diverticulosis noted along the proximal sigmoid colon, without evidence of diverticulitis.   Electronically Signed   By: Roanna Raider M.D.   On: 07/14/2014 21:58

## 2014-08-10 ENCOUNTER — Telehealth (INDEPENDENT_AMBULATORY_CARE_PROVIDER_SITE_OTHER): Payer: Self-pay | Admitting: *Deleted

## 2014-08-10 NOTE — Telephone Encounter (Signed)
Kara JonesCarolyn would like to know if there is something else Dr. Karilyn Cotaehman can give her other than Murelax and a pill (can not remember the name). She is also taking 2 Linzess 145 every morning and would like to get it changed to one pill daily. Her return phone number is [phone].

## 2014-08-12 NOTE — Telephone Encounter (Signed)
Reviewing Dr.Rehman's office note for 07/13/14. Patient is taking 2 Colace Capsules daily , Linzess 145 mcg twice daily. She was advised to continue a high fiber diet. Fleet Enema once or twice a day. Can use a Dulcoax Suppository or a Fleet Enema every 3 rd day. At the time of her OV exam showed soft stool in rectum but impaction that required digital to loosen.  This morning patient sent a man to office, to ask that we give her something for impaction. Per Dorene Arerri Setzer, NP-C the patient should go to ED for further evaluation . The gentleman was made aware.

## 2014-08-13 NOTE — Telephone Encounter (Signed)
Kara JonesCarolyn said the [employer] Technology[employer]ulcoax Suppository make her sick and hurts to use. She also has not had any Colace Capsules and [facility] doesn't have a Rx for this. Would like for Tammy to call this in.

## 2014-08-13 NOTE — Telephone Encounter (Signed)
Colace 100 mg - Take 2 by mouth daily - 1 month [facility]. They will call patient when ready.

## 2014-08-18 ENCOUNTER — Other Ambulatory Visit (HOSPITAL_COMMUNITY): Payer: Self-pay | Admitting: Family Medicine

## 2014-08-18 ENCOUNTER — Emergency Department (HOSPITAL_COMMUNITY)
Admission: EM | Admit: 2014-08-18 | Discharge: 2014-08-18 | Disposition: A | Discharge status: Home / Self Care | Payer: Medicare HMO | Attending: Emergency Medicine | Admitting: Emergency Medicine

## 2014-08-18 ENCOUNTER — Emergency Department (HOSPITAL_COMMUNITY): Payer: Medicare HMO

## 2014-08-18 ENCOUNTER — Encounter (HOSPITAL_COMMUNITY): Payer: Self-pay

## 2014-08-18 DIAGNOSIS — Z87891 Personal history of nicotine dependence: Secondary | ICD-10-CM | POA: Insufficient documentation

## 2014-08-18 DIAGNOSIS — S4991XA Unspecified injury of right shoulder and upper arm, initial encounter: Secondary | ICD-10-CM | POA: Diagnosis present

## 2014-08-18 DIAGNOSIS — G8929 Other chronic pain: Secondary | ICD-10-CM | POA: Insufficient documentation

## 2014-08-18 DIAGNOSIS — Z862 Personal history of diseases of the blood and blood-forming organs and certain disorders involving the immune mechanism: Secondary | ICD-10-CM | POA: Insufficient documentation

## 2014-08-18 DIAGNOSIS — Y9289 Other specified places as the place of occurrence of the external cause: Secondary | ICD-10-CM | POA: Diagnosis not present

## 2014-08-18 DIAGNOSIS — Z79899 Other long term (current) drug therapy: Secondary | ICD-10-CM | POA: Insufficient documentation

## 2014-08-18 DIAGNOSIS — E119 Type 2 diabetes mellitus without complications: Secondary | ICD-10-CM | POA: Insufficient documentation

## 2014-08-18 DIAGNOSIS — I129 Hypertensive chronic kidney disease with stage 1 through stage 4 chronic kidney disease, or unspecified chronic kidney disease: Secondary | ICD-10-CM | POA: Insufficient documentation

## 2014-08-18 DIAGNOSIS — R2242 Localized swelling, mass and lump, left lower limb: Secondary | ICD-10-CM | POA: Insufficient documentation

## 2014-08-18 DIAGNOSIS — Z8701 Personal history of pneumonia (recurrent): Secondary | ICD-10-CM | POA: Diagnosis not present

## 2014-08-18 DIAGNOSIS — S8012XA Contusion of left lower leg, initial encounter: Secondary | ICD-10-CM | POA: Diagnosis not present

## 2014-08-18 DIAGNOSIS — R609 Edema, unspecified: Secondary | ICD-10-CM

## 2014-08-18 DIAGNOSIS — Y9389 Activity, other specified: Secondary | ICD-10-CM | POA: Diagnosis not present

## 2014-08-18 DIAGNOSIS — N182 Chronic kidney disease, stage 2 (mild): Secondary | ICD-10-CM | POA: Insufficient documentation

## 2014-08-18 DIAGNOSIS — M79605 Pain in left leg: Principal | ICD-10-CM

## 2014-08-18 DIAGNOSIS — R52 Pain, unspecified: Secondary | ICD-10-CM

## 2014-08-18 DIAGNOSIS — Y998 Other external cause status: Secondary | ICD-10-CM | POA: Insufficient documentation

## 2014-08-18 DIAGNOSIS — W08XXXA Fall from other furniture, initial encounter: Secondary | ICD-10-CM | POA: Insufficient documentation

## 2014-08-18 DIAGNOSIS — S40011A Contusion of right shoulder, initial encounter: Secondary | ICD-10-CM | POA: Diagnosis not present

## 2014-08-18 DIAGNOSIS — E039 Hypothyroidism, unspecified: Secondary | ICD-10-CM | POA: Insufficient documentation

## 2014-08-18 DIAGNOSIS — J441 Chronic obstructive pulmonary disease with (acute) exacerbation: Secondary | ICD-10-CM | POA: Insufficient documentation

## 2014-08-18 DIAGNOSIS — M79604 Pain in right leg: Secondary | ICD-10-CM

## 2014-08-18 DIAGNOSIS — W102XXA Fall (on)(from) incline, initial encounter: Secondary | ICD-10-CM

## 2014-08-18 MED ORDER — HYDROCODONE-ACETAMINOPHEN 5-325 MG PO TABS: 1.0000 | ORAL_TABLET | Freq: Four times a day (QID) | ORAL | Status: DC | PRN

## 2014-08-18 NOTE — ED Notes (Signed)
Pt states she tripped over her feet and fell onto her porch. Complain of pain in right shoulder and left leg

## 2014-08-18 NOTE — Discharge Instructions (Signed)
Follow up tomorrow to get your leg study.    Follow up with your family md later this week.

## 2014-08-18 NOTE — ED Provider Notes (Signed)
CSN: [id number]     Arrival date & time 08/18/14  1555 History  This chart was scribed for Benny Lennert, MD by Luisa Dago, ED Scribe. This patient was seen in room APA12/APA12 and the patient's care was started at 4:03 PM.    Chief Complaint  Patient presents with  . Fall   Patient is a 69 y.o. female presenting with fall. The history is provided by the patient and medical records. No language interpreter was used.  Fall This is a new problem. The current episode started less than 1 hour ago. The problem occurs rarely. The problem has not changed since onset.Pertinent negatives include no chest pain, no abdominal pain and no headaches. Nothing aggravates the symptoms. Nothing relieves the symptoms. She has tried nothing for the symptoms.   HPI Comments: Kara [name] is a 69 y.o. female with a PMhx of COPD, DM, and Arthritis listed below was brought to the Emergency Department by EMS complaining of a fall that occurred 30 minutes ago. Pt states that she tripped and fell onto her porch face first. She is now complaining of associated right shoulder and left leg pain. Pt states that she was seen by her PCP prior to the fall for swelling to her left leg. Her PCP scheduled her for a doppler tomorrow. Denies any LOC. Pt denies fever, neck pain, sore throat, visual disturbance, CP, cough, SOB, abdominal pain, nausea, emesis, diarrhea, urinary symptoms, back pain, HA, weakness, numbness and rash as associated symptoms.    Past Medical History  Diagnosis Date  . Type 2 diabetes mellitus   . Arthritis   . COPD (chronic obstructive pulmonary disease)     Home oxygen  . Palpitations     Reports history of atrial fibrillation, although none seen recently  . Coronary atherosclerosis of native coronary artery     Nonobstructive at catheterization 2008  . GERD (gastroesophageal reflux disease)   . Irritable bowel syndrome   . Hypothyroidism   . Hyperlipidemia   . Chronic edema   . Pneumonia    . Essential hypertension   . Chronic pain   . Anemia of chronic disease   . Bulging lumbar disc     3  . Headache(784.0)   . Sprain of neck   . Asthma   . Visual loss, left eye   . CKD (chronic kidney disease), stage II   . Claustrophobia   . Temporal arteritis   . Memory difficulty    Past Surgical History  Procedure Laterality Date  . Cholecystectomy    . Appendectomy    . Foot surgery    . Abdominal hysterectomy    . Cataract extraction w/phaco Left 09/01/2013    Procedure: CATARACT EXTRACTION PHACO AND INTRAOCULAR LENS PLACEMENT (IOC);  Surgeon: Gemma Payor, MD;  Location: [facility];  Service: Ophthalmology;  Laterality: Left;  CDE:  14.13  . Artery biopsy Left 09/22/2013    Procedure: BIOPSY TEMPORAL ARTERY;  Surgeon: Mariella Saa, MD;  Location: [facility];  Service: General;  Laterality: Left;   Family History  Problem Relation Age of Onset  . Hypertension    . Diabetes Brother   . Migraines Brother    History  Substance Use Topics  . Smoking status: Former Smoker -- 1.00 packs/day for 48 years    Types: Cigarettes    Start date: 05/26/1959    Quit date: 12/28/1995  . Smokeless tobacco: Never Used  . Alcohol Use: No   OB History  Gravida Para Term Preterm AB TAB SAB Ectopic Multiple Living   1 1 1             Review of Systems  Constitutional: Negative for appetite change and fatigue.  HENT: Negative for congestion, ear discharge and sinus pressure.   Eyes: Negative for discharge.  Respiratory: Negative for cough.   Cardiovascular: Positive for leg swelling. Negative for chest pain.  Gastrointestinal: Negative for abdominal pain and diarrhea.  Genitourinary: Negative for frequency and hematuria.  Musculoskeletal: Positive for arthralgias. Negative for back pain.  Skin: Negative for rash.  Neurological: Negative for seizures and headaches.  Psychiatric/Behavioral: Negative for hallucinations.      Allergies  Prednisone and Codeine  Home Medications    Prior to Admission medications   Medication Sig Start Date End Date Taking? Authorizing Provider  acetaminophen (TYLENOL) 325 MG tablet Take 650 mg by mouth every 6 (six) hours as needed for mild pain.     Historical Provider, MD  ALPRAZolam Prudy Feeler(XANAX) 1 MG tablet Take 1 mg by mouth 3 (three) times daily as needed for anxiety.     Historical Provider, MD  cholecalciferol (VITAMIN D) 1000 UNITS tablet Take 1,000 Units by mouth daily.    Historical Provider, MD  citalopram (CELEXA) 20 MG tablet Take 20 mg by mouth 2 (two) times daily.     Historical Provider, MD  clotrimazole-betamethasone (LOTRISONE) cream Apply 1 application topically 2 (two) times daily. Apply to ear    Historical Provider, MD  dexamethasone (DECADRON) 1 MG tablet Take 3 tablets alternating with 2 tablets every other day 06/02/14   York Spanielharles K Willis, MD  docusate sodium (COLACE) 100 MG capsule Take 2 capsules (200 mg total) by mouth daily. 07/13/14   Malissa HippoNajeeb U Rehman, MD  donepezil (ARICEPT) 5 MG tablet TAKE ONE TABLET BY MOUTH EVERY NIGHT AT BEDTIME 07/13/14   York Spanielharles K Willis, MD  fenofibrate 160 MG tablet Take 160 mg by mouth at bedtime.    Historical Provider, MD  furosemide (LASIX) 80 MG tablet Take 40 mg by mouth daily.     Historical Provider, MD  gabapentin (NEURONTIN) 100 MG capsule Take 1 capsule (100 mg total) by mouth 3 (three) times daily. 04/24/14   York Spanielharles K Willis, MD  levothyroxine (SYNTHROID, LEVOTHROID) 100 MCG tablet Take 100 mcg by mouth daily.      Historical Provider, MD  Linaclotide Karlene Einstein(LINZESS) 145 MCG CAPS capsule Take 2 capsules (290 mcg total) by mouth daily. 07/13/14   Malissa HippoNajeeb U Rehman, MD  metFORMIN (GLUCOPHAGE) 1000 MG tablet Take 1,000 mg by mouth 2 (two) times daily with a meal.     Historical Provider, MD  metoprolol succinate (TOPROL-XL) 100 MG 24 hr tablet Take 1 tablet (100 mg total) by mouth daily. Take with or immediately following a meal. 05/01/14   Jonelle SidleSamuel G McDowell, MD  morphine (MS CONTIN) 30 MG  12 hr tablet Take 30 mg by mouth 2 (two) times daily. To treat pain associated with 3 bulging disc in back    Historical Provider, MD  nitroGLYCERIN (NITROSTAT) 0.4 MG SL tablet Place 0.4 mg under the tongue every 5 (five) minutes as needed for chest pain.     Historical Provider, MD  pantoprazole (PROTONIX) 40 MG tablet Take 40 mg by mouth 2 (two) times daily.    Historical Provider, MD  Potassium Chloride Crys CR (KLOR-CON M20 PO) Take 20 mEq by mouth daily.     Historical Provider, MD  traMADol (ULTRAM) 50 MG tablet Take  1 tablet (50 mg total) by mouth every 6 (six) hours as needed. 07/14/14   Vanetta Mulders, MD  vitamin B-12 (CYANOCOBALAMIN) 1000 MCG tablet Take 1,000 mcg by mouth daily.    Historical Provider, MD  zolpidem (AMBIEN) 10 MG tablet Take 1 tablet (10 mg total) by mouth at bedtime as needed for sleep. 08/06/14 09/05/14  York Spaniel, MD   BP 129/41 mmHg  Pulse 67  Temp(Src) 98.6 F (37 C) (Oral)  Resp 20  Ht  (1.676 m)  Wt 166 lb (75.297 kg)  BMI 26.81 kg/m2  SpO2 100%   Physical Exam  Constitutional: She is oriented to person, place, and time. She appears well-developed.  HENT:  Head: Normocephalic.  Eyes: Conjunctivae and EOM are normal. No scleral icterus.  Neck: Neck supple. No thyromegaly present.  Cardiovascular: Normal rate and regular rhythm.  Exam reveals no gallop and no friction rub.   No murmur heard. Pulmonary/Chest: Effort normal. No stridor. She has wheezes (bilaterally). She has no rales. She exhibits no tenderness.  Abdominal: She exhibits no distension. There is no tenderness. There is no rebound.  Musculoskeletal: Normal range of motion. She exhibits no edema.  Mild tenderness to right shoulder. 2+ pitting edema to both lower extremities. Bruising noted to right tibia.   Lymphadenopathy:    She has no cervical adenopathy.  Neurological: She is oriented to person, place, and time. She exhibits normal muscle tone. Coordination normal.  Skin:  No rash noted. No erythema.  Psychiatric: She has a normal mood and affect. Her behavior is normal.  Nursing note and vitals reviewed.   ED Course  Procedures (including critical care time)  DIAGNOSTIC STUDIES: Oxygen Saturation is 100% on RA, normal by my interpretation.    COORDINATION OF CARE: 4:09 PM- Will order doppler. Pt advised of plan for treatment and pt agrees.  Labs Review Labs Reviewed - No data to display  Imaging Review No results found.   EKG Interpretation None      MDM   Final diagnoses:  None    Contusion left shoulder and right lower leg.   tx with vicodin and follow up with pcp   The chart was scribed for me under my direct supervision.  I personally performed the history, physical, and medical decision making and all procedures in the evaluation of this patient.Benny Lennert, MD 08/18/14 [phone]

## 2014-08-19 ENCOUNTER — Ambulatory Visit (HOSPITAL_COMMUNITY): Admission: RE | Admit: 2014-08-19 | Payer: Medicare HMO | Source: Ambulatory Visit

## 2014-08-19 ENCOUNTER — Ambulatory Visit (HOSPITAL_COMMUNITY): Payer: Medicare HMO

## 2014-08-19 ENCOUNTER — Telehealth (INDEPENDENT_AMBULATORY_CARE_PROVIDER_SITE_OTHER): Payer: Self-pay | Admitting: *Deleted

## 2014-08-19 NOTE — Telephone Encounter (Signed)
Kara [name] is needing a Rx for Linzess called to [facility]. She is currently taking Linzess 145 twice every morning. She is requesting the Linzess 290 to be called in so she will not have to take 2 pills at once. The return phone number is [phone].

## 2014-08-19 NOTE — Telephone Encounter (Signed)
A prescription for Linzess 290 mcg ,take 1 by mouth daily #30 with 5 refills was [facility]. Patient has other meds to pick up so Pharmacy says that they will have this ready to pick up with the other.

## 2014-08-20 ENCOUNTER — Telehealth: Payer: Self-pay | Admitting: *Deleted

## 2014-08-20 ENCOUNTER — Other Ambulatory Visit: Payer: Self-pay | Admitting: Neurology

## 2014-08-20 MED ORDER — DONEPEZIL HCL 10 MG PO TABS: 10.0000 mg | ORAL_TABLET | Freq: Every day | ORAL | Status: DC

## 2014-08-20 NOTE — Telephone Encounter (Signed)
Patient is calling wanting an increase on Aricept 5 mg. Patient is scheduled to come in on Tuesday 08-25-14 to be seen. Please advise.

## 2014-08-20 NOTE — Telephone Encounter (Signed)
I called patient. She is doing well on the 5 mg dose of Aricept, we will go to the 10 mg dose. She is to follow-up on February 2.

## 2014-08-21 ENCOUNTER — Ambulatory Visit (HOSPITAL_COMMUNITY)
Admission: RE | Admit: 2014-08-21 | Discharge: 2014-08-21 | Disposition: A | Discharge status: Home / Self Care | Payer: Medicare HMO | Source: Ambulatory Visit | Attending: Family Medicine | Admitting: Family Medicine

## 2014-08-21 DIAGNOSIS — M79605 Pain in left leg: Principal | ICD-10-CM

## 2014-08-21 DIAGNOSIS — R609 Edema, unspecified: Secondary | ICD-10-CM | POA: Diagnosis not present

## 2014-08-21 DIAGNOSIS — M79604 Pain in right leg: Secondary | ICD-10-CM | POA: Diagnosis not present

## 2014-08-22 ENCOUNTER — Emergency Department (HOSPITAL_COMMUNITY)
Admission: EM | Admit: 2014-08-22 | Discharge: 2014-08-22 | Disposition: A | Discharge status: Home / Self Care | Payer: Medicare HMO | Attending: Emergency Medicine | Admitting: Emergency Medicine

## 2014-08-22 ENCOUNTER — Encounter (HOSPITAL_COMMUNITY): Payer: Self-pay | Admitting: Emergency Medicine

## 2014-08-22 DIAGNOSIS — K219 Gastro-esophageal reflux disease without esophagitis: Secondary | ICD-10-CM | POA: Insufficient documentation

## 2014-08-22 DIAGNOSIS — Z862 Personal history of diseases of the blood and blood-forming organs and certain disorders involving the immune mechanism: Secondary | ICD-10-CM | POA: Insufficient documentation

## 2014-08-22 DIAGNOSIS — S81811A Laceration without foreign body, right lower leg, initial encounter: Secondary | ICD-10-CM

## 2014-08-22 DIAGNOSIS — M1990 Unspecified osteoarthritis, unspecified site: Secondary | ICD-10-CM | POA: Insufficient documentation

## 2014-08-22 DIAGNOSIS — I129 Hypertensive chronic kidney disease with stage 1 through stage 4 chronic kidney disease, or unspecified chronic kidney disease: Secondary | ICD-10-CM | POA: Insufficient documentation

## 2014-08-22 DIAGNOSIS — Z8659 Personal history of other mental and behavioral disorders: Secondary | ICD-10-CM | POA: Diagnosis not present

## 2014-08-22 DIAGNOSIS — N182 Chronic kidney disease, stage 2 (mild): Secondary | ICD-10-CM | POA: Diagnosis not present

## 2014-08-22 DIAGNOSIS — W208XXA Other cause of strike by thrown, projected or falling object, initial encounter: Secondary | ICD-10-CM | POA: Diagnosis not present

## 2014-08-22 DIAGNOSIS — I2510 Atherosclerotic heart disease of native coronary artery without angina pectoris: Secondary | ICD-10-CM | POA: Insufficient documentation

## 2014-08-22 DIAGNOSIS — Z8701 Personal history of pneumonia (recurrent): Secondary | ICD-10-CM | POA: Diagnosis not present

## 2014-08-22 DIAGNOSIS — Y9389 Activity, other specified: Secondary | ICD-10-CM | POA: Insufficient documentation

## 2014-08-22 DIAGNOSIS — Y998 Other external cause status: Secondary | ICD-10-CM | POA: Insufficient documentation

## 2014-08-22 DIAGNOSIS — Z87891 Personal history of nicotine dependence: Secondary | ICD-10-CM | POA: Insufficient documentation

## 2014-08-22 DIAGNOSIS — E119 Type 2 diabetes mellitus without complications: Secondary | ICD-10-CM | POA: Insufficient documentation

## 2014-08-22 DIAGNOSIS — E039 Hypothyroidism, unspecified: Secondary | ICD-10-CM | POA: Diagnosis not present

## 2014-08-22 DIAGNOSIS — G8929 Other chronic pain: Secondary | ICD-10-CM | POA: Diagnosis not present

## 2014-08-22 DIAGNOSIS — Z79899 Other long term (current) drug therapy: Secondary | ICD-10-CM | POA: Diagnosis not present

## 2014-08-22 DIAGNOSIS — J449 Chronic obstructive pulmonary disease, unspecified: Secondary | ICD-10-CM | POA: Diagnosis not present

## 2014-08-22 DIAGNOSIS — Y9289 Other specified places as the place of occurrence of the external cause: Secondary | ICD-10-CM | POA: Insufficient documentation

## 2014-08-22 DIAGNOSIS — Z8739 Personal history of other diseases of the musculoskeletal system and connective tissue: Secondary | ICD-10-CM | POA: Insufficient documentation

## 2014-08-22 DIAGNOSIS — S8991XA Unspecified injury of right lower leg, initial encounter: Secondary | ICD-10-CM | POA: Diagnosis present

## 2014-08-22 MED ORDER — LIDOCAINE HCL (PF) 1 % IJ SOLN
5.0000 mL | Freq: Once | INTRAMUSCULAR | Status: DC
  Filled 2014-08-22: qty 5

## 2014-08-22 MED ORDER — ALPRAZOLAM 0.5 MG PO TABS
1.0000 mg | ORAL_TABLET | Freq: Once | ORAL | Status: AC
  Administered 2014-08-22: 1 mg via ORAL
  Filled 2014-08-22: qty 2

## 2014-08-22 MED ORDER — LIDOCAINE-EPINEPHRINE-TETRACAINE (LET) SOLUTION
3.0000 mL | Freq: Once | NASAL | Status: AC
  Administered 2014-08-22: 3 mL via TOPICAL
  Filled 2014-08-22: qty 3

## 2014-08-22 MED ORDER — LIDOCAINE-EPINEPHRINE-TETRACAINE (LET) SOLUTION
NASAL | Status: AC
  Filled 2014-08-22: qty 3

## 2014-08-22 NOTE — Discharge Instructions (Signed)
Keep the wound dry and dressing in place for 1-2 days. Then can remove and put on a fresh dressing. Would recommend treating it with the some Neosporin type ointment before putting the new dressing on. Staple removal in 10 days. This can be done by your primary care doctor or you can return here. Return earlier for any development of infection.

## 2014-08-22 NOTE — ED Notes (Signed)
6 staples placed, patient tolerated well. Waiting for Dr Deretha EmoryZackowski to assess.

## 2014-08-22 NOTE — ED Notes (Addendum)
Pt reports was getting out of the car today and oxygen tank fell on right ankle. Large,deep laceration noted. No active bleeding noted. +3 pitting edema noted to BLE. Skin weeping from laceration site. Pt denies being on any blood thinners.

## 2014-08-22 NOTE — ED Provider Notes (Addendum)
CSN: [id number]     Arrival date & time 08/22/14  1447 History   First MD Initiated Contact with Patient 08/22/14 1501     Chief Complaint  Patient presents with  . Extremity Laceration     (Consider location/radiation/quality/duration/timing/severity/associated sxs/prior Treatment) The history is provided by the patient and the EMS personnel.   patient brought in by EMS. Patient with laceration to her right leg from oxygen bottle. No other injuries. Patient did not fall. Patient has a long-standing history of bilateral leg edema. Also has a history of a COPD she uses the oxygen. Patient tetanus is up-to-date. In the area is 6 out of 10.  Past Medical History  Diagnosis Date  . Type 2 diabetes mellitus   . Arthritis   . COPD (chronic obstructive pulmonary disease)     Home oxygen  . Palpitations     Reports history of atrial fibrillation, although none seen recently  . Coronary atherosclerosis of native coronary artery     Nonobstructive at catheterization 2008  . GERD (gastroesophageal reflux disease)   . Irritable bowel syndrome   . Hypothyroidism   . Hyperlipidemia   . Chronic edema   . Pneumonia   . Essential hypertension   . Chronic pain   . Anemia of chronic disease   . Bulging lumbar disc     3  . Headache(784.0)   . Sprain of neck   . Asthma   . Visual loss, left eye   . CKD (chronic kidney disease), stage II   . Claustrophobia   . Temporal arteritis   . Memory difficulty    Past Surgical History  Procedure Laterality Date  . Cholecystectomy    . Appendectomy    . Foot surgery    . Abdominal hysterectomy    . Cataract extraction w/phaco Left 09/01/2013    Procedure: CATARACT EXTRACTION PHACO AND INTRAOCULAR LENS PLACEMENT (IOC);  Surgeon: Gemma Payor, MD;  Location: [facility];  Service: Ophthalmology;  Laterality: Left;  CDE:  14.13  . Artery biopsy Left 09/22/2013    Procedure: BIOPSY TEMPORAL ARTERY;  Surgeon: Mariella Saa, MD;  Location: [facility];  Service:  General;  Laterality: Left;   Family History  Problem Relation Age of Onset  . Hypertension    . Diabetes Brother   . Migraines Brother    History  Substance Use Topics  . Smoking status: Former Smoker -- 1.00 packs/day for 48 years    Types: Cigarettes    Start date: 05/26/1959    Quit date: 12/28/1995  . Smokeless tobacco: Never Used  . Alcohol Use: No   OB History    Gravida Para Term Preterm AB TAB SAB Ectopic Multiple Living   Review of Systems  Constitutional: Negative for fever.  HENT: Negative for congestion.   Eyes: Negative for redness.  Respiratory: Negative for shortness of breath.   Cardiovascular: Negative for chest pain.  Gastrointestinal: Negative for nausea, vomiting and abdominal pain.  Genitourinary: Negative for dysuria.  Musculoskeletal: Negative for back pain.  Skin: Negative for rash.  Neurological: Negative for headaches.  Hematological: Does not bruise/bleed easily.  Psychiatric/Behavioral: Negative for confusion.      Allergies  Prednisone and Codeine  Home Medications   Prior to Admission medications   Medication Sig Start Date End Date Taking? Authorizing Provider  dexamethasone (DECADRON) 1 MG tablet Take 3 tablets alternating with 2 tablets every other  day 06/02/14  Yes York Spaniel, MD  docusate sodium (COLACE) 100 MG capsule Take 2 capsules (200 mg total) by mouth daily. Patient taking differently: Take 200 mg by mouth daily as needed for mild constipation.  07/13/14  Yes Malissa Hippo, MD  donepezil (ARICEPT) 10 MG tablet Take 1 tablet (10 mg total) by mouth at bedtime. 08/20/14  Yes York Spaniel, MD  acetaminophen (TYLENOL) 325 MG tablet Take 650 mg by mouth every 6 (six) hours as needed for mild pain.     Historical Provider, MD  ALPRAZolam Prudy Feeler) 1 MG tablet Take 1 mg by mouth 3 (three) times daily as needed for anxiety.     Historical Provider, MD  cholecalciferol (VITAMIN D) 1000 UNITS tablet Take  1,000 Units by mouth daily.    Historical Provider, MD  citalopram (CELEXA) 20 MG tablet Take 20 mg by mouth 2 (two) times daily.     Historical Provider, MD  clotrimazole-betamethasone (LOTRISONE) cream Apply 1 application topically 2 (two) times daily. Apply to ear    Historical Provider, MD  fenofibrate 160 MG tablet Take 160 mg by mouth at bedtime.    Historical Provider, MD  furosemide (LASIX) 80 MG tablet Take 40 mg by mouth daily.     Historical Provider, MD  gabapentin (NEURONTIN) 100 MG capsule Take 1 capsule (100 mg total) by mouth 3 (three) times daily. 04/24/14   York Spaniel, MD  HYDROcodone-acetaminophen (NORCO/VICODIN) 5-325 MG per tablet Take 1 tablet by mouth every 6 (six) hours as needed. 08/18/14   Benny Lennert, MD  levothyroxine (SYNTHROID, LEVOTHROID) 100 MCG tablet Take 100 mcg by mouth daily.      Historical Provider, MD  Linaclotide Karlene Einstein) 145 MCG CAPS capsule Take 2 capsules (290 mcg total) by mouth daily. 07/13/14   Malissa Hippo, MD  metFORMIN (GLUCOPHAGE) 1000 MG tablet Take 1,000 mg by mouth 2 (two) times daily with a meal.     Historical Provider, MD  metoprolol succinate (TOPROL-XL) 100 MG 24 hr tablet Take 1 tablet (100 mg total) by mouth daily. Take with or immediately following a meal. 05/01/14   Jonelle Sidle, MD  morphine (MS CONTIN) 30 MG 12 hr tablet Take 30 mg by mouth 2 (two) times daily. To treat pain associated with 3 bulging disc in back    Historical Provider, MD  nitroGLYCERIN (NITROSTAT) 0.4 MG SL tablet Place 0.4 mg under the tongue every 5 (five) minutes as needed for chest pain.     Historical Provider, MD  pantoprazole (PROTONIX) 40 MG tablet Take 40 mg by mouth 2 (two) times daily.    Historical Provider, MD  Potassium Chloride Crys CR (KLOR-CON M20 PO) Take 20 mEq by mouth daily.     Historical Provider, MD  traMADol (ULTRAM) 50 MG tablet Take 1 tablet (50 mg total) by mouth every 6 (six) hours as needed. 07/14/14   Vanetta Mulders, MD   vitamin B-12 (CYANOCOBALAMIN) 1000 MCG tablet Take 1,000 mcg by mouth daily.    Historical Provider, MD  zolpidem (AMBIEN) 10 MG tablet Take 1 tablet (10 mg total) by mouth at bedtime as needed for sleep. 08/06/14 09/05/14  York Spaniel, MD   BP 113/91 mmHg  Pulse 63  Temp(Src) 99 F (37.2 C) (Oral)  Resp 16  Ht  (1.676 m)  Wt 166 lb (75.297 kg)  BMI 26.81 kg/m2  SpO2 100% Physical Exam  Constitutional: She is oriented to person, place, and time.  She appears well-developed and well-nourished. No distress.  HENT:  Head: Normocephalic and atraumatic.  Mouth/Throat: Oropharynx is clear and moist.  Eyes: Conjunctivae and EOM are normal. Pupils are equal, round, and reactive to light.  Neck: Normal range of motion. Neck supple.  Cardiovascular: Normal rate, regular rhythm and normal heart sounds.   Pulmonary/Chest: Effort normal and breath sounds normal.  Abdominal: Bowel sounds are normal. There is no tenderness.  Musculoskeletal: She exhibits edema.  Bilateral leg edema with some bruising left greater than right. Right leg with a V-shaped avulsion laceration to the lower part of the right leg each arm of the laceration measures about 3-4 cm. No significant bleeding no evidence of any significant deep space tissue injury.  Neurological: She is alert and oriented to person, place, and time. No cranial nerve deficit. She exhibits normal muscle tone. Coordination normal.  Patient is legally blind in both eyes.  Nursing note and vitals reviewed.   ED Course  Procedures (including critical care time) Labs Review Labs Reviewed - No data to display  Imaging Review [place] Venous Img Lower Unilateral Right  08/21/2014   CLINICAL DATA:  Right leg pain and swelling. History of recent fall. History of smoking. Evaluate for DVT.  EXAM: RIGHT LOWER EXTREMITY VENOUS DOPPLER ULTRASOUND  TECHNIQUE: Gray-scale sonography with graded compression, as well as color Doppler and duplex ultrasound were  performed to evaluate the lower extremity deep venous systems from the level of the common femoral vein and including the common femoral, femoral, profunda femoral, popliteal and calf veins including the posterior tibial, peroneal and gastrocnemius veins when visible. The superficial great saphenous vein was also interrogated. Spectral Doppler was utilized to evaluate flow at rest and with distal augmentation maneuvers in the common femoral, femoral and popliteal veins.  COMPARISON:  None.  FINDINGS: Contralateral Common Femoral Vein: Respiratory phasicity is normal and symmetric with the symptomatic side. No evidence of thrombus. Normal compressibility.  Common Femoral Vein: No evidence of thrombus. Normal compressibility, respiratory phasicity and response to augmentation.  Saphenofemoral Junction: No evidence of thrombus. Normal compressibility and flow on color Doppler imaging.  Profunda Femoral Vein: No evidence of thrombus. Normal compressibility and flow on color Doppler imaging.  Femoral Vein: No evidence of thrombus. Normal compressibility, respiratory phasicity and response to augmentation.  Popliteal Vein: No evidence of thrombus. Normal compressibility, respiratory phasicity and response to augmentation.  Calf Veins: No evidence of thrombus. Normal compressibility and flow on color Doppler imaging.  Superficial Great Saphenous Vein: No evidence of thrombus. Normal compressibility and flow on color Doppler imaging.  Venous Reflux:  None.  Other Findings:  None.  IMPRESSION: No evidence of DVT within the right lower extremity.   Electronically Signed   By: Simonne ComeJohn  Watts M.D.   On: 08/21/2014 13:21   [place] Arterial Seg Single  08/21/2014   CLINICAL DATA:  69 year old female with a history of leg pain and swelling.  Cardiovascular risk factors include tobacco use.  EXAM: NONINVASIVE PHYSIOLOGIC VASCULAR STUDY OF BILATERAL LOWER EXTREMITIES  TECHNIQUE: Evaluation of both lower extremities was performed at rest,  including calculation of ankle-brachial indices, and segmental Doppler examination.  COMPARISON:  CT 01/03/2013  FINDINGS: Right:  Resting ankle brachial index:  0.95  Doppler: Doppler examination of the posterior tibial and dorsalis pedis arteries. Triphasic waveform of the posterior tibial artery with biphasic waveform of the dorsalis pedis.  Left:  Resting ankle brachial index: 1.10  Doppler: Doppler examination of the tibial vessels on the left demonstrates  a biphasic waveform of both the posterior tibial and dorsalis pedis arteries.  IMPRESSION: Resting ankle brachial index demonstrates no evidence of arterial occlusive disease of the bilateral lower extremities.  Doppler examination of the bilateral tibial vessels demonstrates evidence of right-sided infrapopliteal disease, worst in the dorsalis pedis.  On the left, the abnormal waveform of the posterior tibial and dorsalis pedis may represent arterial disease isolated to the tibial vessels, or alternatively within any segment of the more proximal arteries.  Signed,  Yvone Neu. Loreta Ave, DO  [facility]  [facility]   Electronically Signed   By: Gilmer Mor D.O.   On: 08/21/2014 14:18     EKG Interpretation None      MDM   Final diagnoses:  Laceration of leg, right, initial encounter    Medical screening examination/treatment/procedure(s) were conducted as a shared visit with non-physician practitioner(s) and myself.  I personally evaluated the patient during the encounter.   This is a shared visit with the nurse practitioner who actually did the wound repair.  Patient with a laceration to her right lower leg from her oxygen bottle it slipped and cut it in a V-shaped avulsion. Each arm of the laceration measuring 3-4 cm in length. No significant deep skin based tissue injury. Patient does have edema to that leg. It was closed with surgical staples and the wound seems to be brought together nicely.  Patient will have staples removed in 10 days. We'll keep wound dry for 1-2 days with the dressing in place and then apply a new fresh dressing.   EKG Interpretation None         Vanetta Mulders, MD 08/22/14 1725  Vanetta Mulders, MD 08/22/14 1726

## 2014-08-22 NOTE — ED Provider Notes (Signed)
[name] is a 69 y.o. female with a laceration to the right lower leg s/p injury.  THIS IS A SHARED VISIT WITH DR. ZACKOWSKI PROCEDURE: LACERATION REPAIR Performed by: Bow Buntyn Authorized by: Teyla Skidgel Consent: Verbal consent obtained. Risks and benefits: risks, benefits and alternatives were discussed Consent given by: patient Patient identity confirmed: provided demographic data Prepped and Draped in normal sterile fashion  Wound explored  Laceration Location: right lower leg  Laceration Length: V shaped 7.5 cm  No Foreign Bodies seen or palpated  Anesthesia:LET  Irrigation method: syringe Amount of cleaning: standard  Skin closure: staples x 6  Patient tolerance: Patient tolerated the procedure well with no immediate complications.   [address]Hope Mount PleasantM Raeya Merritts, [place]NP 08/22/14 1648  Vanetta MuldersScott Zackowski, MD 08/22/14 1726

## 2014-08-25 ENCOUNTER — Other Ambulatory Visit (HOSPITAL_COMMUNITY): Payer: Medicare HMO

## 2014-08-25 ENCOUNTER — Encounter: Payer: Self-pay | Admitting: Neurology

## 2014-08-25 ENCOUNTER — Ambulatory Visit (INDEPENDENT_AMBULATORY_CARE_PROVIDER_SITE_OTHER): Payer: Medicare HMO | Admitting: Neurology

## 2014-08-25 VITALS — BP 95/48 | HR 56 | Ht 66.0 in | Wt 172.0 lb

## 2014-08-25 DIAGNOSIS — H5462 Unqualified visual loss, left eye, normal vision right eye: Secondary | ICD-10-CM

## 2014-08-25 DIAGNOSIS — M316 Other giant cell arteritis: Secondary | ICD-10-CM

## 2014-08-25 DIAGNOSIS — R413 Other amnesia: Secondary | ICD-10-CM | POA: Diagnosis not present

## 2014-08-25 DIAGNOSIS — G441 Vascular headache, not elsewhere classified: Secondary | ICD-10-CM | POA: Diagnosis not present

## 2014-08-25 MED ORDER — GABAPENTIN 100 MG PO CAPS: 200.0000 mg | ORAL_CAPSULE | Freq: Three times a day (TID) | ORAL | Status: DC

## 2014-08-25 NOTE — Patient Instructions (Addendum)
Gabapentin will be increased taking 200 mg 3 times daily, watch out for increased drowsiness.   Headaches, Frequently Asked Questions MIGRAINE HEADACHES Q: What is migraine? What causes it? How can I treat it? A: Generally, migraine headaches begin as a dull ache. Then they develop into a constant, throbbing, and pulsating pain. You may experience pain at the temples. You may experience pain at the front or back of one or both sides of the head. The pain is usually accompanied by a combination of:  Nausea.  Vomiting.  Sensitivity to light and noise. Some people (about 15%) experience an aura (see below) before an attack. The cause of migraine is believed to be chemical reactions in the brain. Treatment for migraine may include over-the-counter or prescription medications. It may also include self-help techniques. These include relaxation training and biofeedback.  Q: What is an aura? A: About 15% of people with migraine get an "aura". This is a sign of neurological symptoms that occur before a migraine headache. You may see wavy or jagged lines, dots, or flashing lights. You might experience tunnel vision or blind spots in one or both eyes. The aura can include visual or auditory hallucinations (something imagined). It may include disruptions in smell (such as strange odors), taste or touch. Other symptoms include:  Numbness.  A "pins and needles" sensation.  Difficulty in recalling or speaking the correct word. These neurological events may last as long as 60 minutes. These symptoms will fade as the headache begins. Q: What is a trigger? A: Certain physical or environmental factors can lead to or "trigger" a migraine. These include:  Foods.  Hormonal changes.  Weather.  Stress. It is important to remember that triggers are different for everyone. To help prevent migraine attacks, you need to figure out which triggers affect you. Keep a headache diary. This is a good way to track  triggers. The diary will help you talk to your healthcare professional about your condition. Q: Does weather affect migraines? A: Bright sunshine, hot, humid conditions, and drastic changes in barometric pressure may lead to, or "trigger," a migraine attack in some people. But studies have shown that weather does not act as a trigger for everyone with migraines. Q: What is the link between migraine and hormones? A: Hormones start and regulate many of your body's functions. Hormones keep your body in balance within a constantly changing environment. The levels of hormones in your body are unbalanced at times. Examples are during menstruation, pregnancy, or menopause. That can lead to a migraine attack. In fact, about three quarters of all women with migraine report that their attacks are related to the menstrual cycle.  Q: Is there an increased risk of stroke for migraine sufferers? A: The likelihood of a migraine attack causing a stroke is very remote. That is not to say that migraine sufferers cannot have a stroke associated with their migraines. In persons under age 13, the most common associated factor for stroke is migraine headache. But over the course of a person's normal life span, the occurrence of migraine headache may actually be associated with a reduced risk of dying from cerebrovascular disease due to stroke.  Q: What are acute medications for migraine? A: Acute medications are used to treat the pain of the headache after it has started. Examples over-the-counter medications, NSAIDs, ergots, and triptans.  Q: What are the triptans? A: Triptans are the newest class of abortive medications. They are specifically targeted to treat migraine. Triptans are vasoconstrictors. They  moderate some chemical reactions in the brain. The triptans work on receptors in your brain. Triptans help to restore the balance of a neurotransmitter called serotonin. Fluctuations in levels of serotonin are thought to be  a main cause of migraine.  Q: Are over-the-counter medications for migraine effective? A: Over-the-counter, or "OTC," medications may be effective in relieving mild to moderate pain and associated symptoms of migraine. But you should see your caregiver before beginning any treatment regimen for migraine.  Q: What are preventive medications for migraine? A: Preventive medications for migraine are sometimes referred to as "prophylactic" treatments. They are used to reduce the frequency, severity, and length of migraine attacks. Examples of preventive medications include antiepileptic medications, antidepressants, beta-blockers, calcium channel blockers, and NSAIDs (nonsteroidal anti-inflammatory drugs). Q: Why are anticonvulsants used to treat migraine? A: During the past few years, there has been an increased interest in antiepileptic drugs for the prevention of migraine. They are sometimes referred to as "anticonvulsants". Both epilepsy and migraine may be caused by similar reactions in the brain.  Q: Why are antidepressants used to treat migraine? A: Antidepressants are typically used to treat people with depression. They may reduce migraine frequency by regulating chemical levels, such as serotonin, in the brain.  Q: What alternative therapies are used to treat migraine? A: The term "alternative therapies" is often used to describe treatments considered outside the scope of conventional Western medicine. Examples of alternative therapy include acupuncture, acupressure, and yoga. Another common alternative treatment is herbal therapy. Some herbs are believed to relieve headache pain. Always discuss alternative therapies with your caregiver before proceeding. Some herbal products contain arsenic and other toxins. TENSION HEADACHES Q: What is a tension-type headache? What causes it? How can I treat it? A: Tension-type headaches occur randomly. They are often the result of temporary stress, anxiety,  fatigue, or anger. Symptoms include soreness in your temples, a tightening band-like sensation around your head (a "vice-like" ache). Symptoms can also include a pulling feeling, pressure sensations, and contracting head and neck muscles. The headache begins in your forehead, temples, or the back of your head and neck. Treatment for tension-type headache may include over-the-counter or prescription medications. Treatment may also include self-help techniques such as relaxation training and biofeedback. CLUSTER HEADACHES Q: What is a cluster headache? What causes it? How can I treat it? A: Cluster headache gets its name because the attacks come in groups. The pain arrives with little, if any, warning. It is usually on one side of the head. A tearing or bloodshot eye and a runny nose on the same side of the headache may also accompany the pain. Cluster headaches are believed to be caused by chemical reactions in the brain. They have been described as the most severe and intense of any headache type. Treatment for cluster headache includes prescription medication and oxygen. SINUS HEADACHES Q: What is a sinus headache? What causes it? How can I treat it? A: When a cavity in the bones of the face and skull (a sinus) becomes inflamed, the inflammation will cause localized pain. This condition is usually the result of an allergic reaction, a tumor, or an infection. If your headache is caused by a sinus blockage, such as an infection, you will probably have a fever. An x-ray will confirm a sinus blockage. Your caregiver's treatment might include antibiotics for the infection, as well as antihistamines or decongestants.  REBOUND HEADACHES Q: What is a rebound headache? What causes it? How can I treat it? A: A  pattern of taking acute headache medications too often can lead to a condition known as "rebound headache." A pattern of taking too much headache medication includes taking it more than 2 days per week or in  excessive amounts. That means more than the label or a caregiver advises. With rebound headaches, your medications not only stop relieving pain, they actually begin to cause headaches. Doctors treat rebound headache by tapering the medication that is being overused. Sometimes your caregiver will gradually substitute a different type of treatment or medication. Stopping may be a challenge. Regularly overusing a medication increases the potential for serious side effects. Consult a caregiver if you regularly use headache medications more than 2 days per week or more than the label advises. ADDITIONAL QUESTIONS AND ANSWERS Q: What is biofeedback? A: Biofeedback is a self-help treatment. Biofeedback uses special equipment to monitor your body's involuntary physical responses. Biofeedback monitors:  Breathing.  Pulse.  Heart rate.  Temperature.  Muscle tension.  Brain activity. Biofeedback helps you refine and perfect your relaxation exercises. You learn to control the physical responses that are related to stress. Once the technique has been mastered, you do not need the equipment any more. Q: Are headaches hereditary? A: Four out of five (80%) of people that suffer report a family history of migraine. Scientists are not sure if this is genetic or a family predisposition. Despite the uncertainty, a child has a 50% chance of having migraine if one parent suffers. The child has a 75% chance if both parents suffer.  Q: Can children get headaches? A: By the time they reach high school, most young people have experienced some type of headache. Many safe and effective approaches or medications can prevent a headache from occurring or stop it after it has begun.  Q: What type of doctor should I see to diagnose and treat my headache? A: Start with your primary caregiver. Discuss his or her experience and approach to headaches. Discuss methods of classification, diagnosis, and treatment. Your caregiver may  decide to recommend you to a headache specialist, depending upon your symptoms or other physical conditions. Having diabetes, allergies, etc., may require a more comprehensive and inclusive approach to your headache. The National Headache Foundation will provide, upon request, a list of [facility] physician members in your state. Document Released: 09/30/2003 Document Revised: 10/02/2011 Document Reviewed: 03/09/2008 [facility] Patient Information 2015 [employer] [place]. This information is not intended to replace advice given to you by your health care provider. Make sure you discuss any questions you have with your health care provider.

## 2014-08-25 NOTE — Progress Notes (Signed)
Reason for visit: Temporal arteritis  Kara [name] is an 69 y.o. female  History of present illness:  Kara [name] is a 69 year old right-handed white female with temporal arteritis, and blindness in the left eye. The patient has had cataract surgery done on the right side, but she indicates that the vision is still not very good in that eye. The patient is on Decadron, and she recently had a sedimentation rate of 30, she was maintained on her current dose of Decadron taking 3 mg alternating with 2 mg every other day. The patient is still complaining of daily headaches, this is a chronic issue since a fall with head injury that occurred in November 2014. The patient is taking up to 6 Tylenol tablets daily. She is not sleeping well, she takes Ambien at night. She does have a memory issue, and she is on Aricept for this. She has a lot of peripheral edema, and she has some difficulty with walking, she has not had any recent falls. She returns to the office today for an evaluation.   Past Medical History  Diagnosis Date  . Type 2 diabetes mellitus   . Arthritis   . COPD (chronic obstructive pulmonary disease)     Home oxygen  . Palpitations     Reports history of atrial fibrillation, although none seen recently  . Coronary atherosclerosis of native coronary artery     Nonobstructive at catheterization 2008  . GERD (gastroesophageal reflux disease)   . Irritable bowel syndrome   . Hypothyroidism   . Hyperlipidemia   . Chronic edema   . Pneumonia   . Essential hypertension   . Chronic pain   . Anemia of chronic disease   . Bulging lumbar disc     3  . Headache(784.0)   . Sprain of neck   . Asthma   . Visual loss, left eye   . CKD (chronic kidney disease), stage II   . Claustrophobia   . Temporal arteritis   . Memory difficulty     Past Surgical History  Procedure Laterality Date  . Cholecystectomy    . Appendectomy    . Foot surgery    . Abdominal hysterectomy    .  Cataract extraction w/phaco Left 09/01/2013    Procedure: CATARACT EXTRACTION PHACO AND INTRAOCULAR LENS PLACEMENT (IOC);  Surgeon: Gemma PayorKerry Hunt, MD;  Location: [facility];  Service: Ophthalmology;  Laterality: Left;  CDE:  14.13  . Artery biopsy Left 09/22/2013    Procedure: BIOPSY TEMPORAL ARTERY;  Surgeon: Mariella SaaBenjamin T Hoxworth, MD;  Location: [facility];  Service: General;  Laterality: Left;    Family History  Problem Relation Age of Onset  . Hypertension    . Diabetes Brother   . Migraines Brother     Social history:  reports that she quit smoking about 18 years ago. Her smoking use included Cigarettes. She started smoking about 55 years ago. She has a 48 pack-year smoking history. She has never used smokeless tobacco. She reports that she does not drink alcohol or use illicit drugs.    Allergies  Allergen Reactions  . Prednisone Other (See Comments)    Can have in small doses  . Codeine Nausea And Vomiting and Rash    Medications:  Current Outpatient Prescriptions on File Prior to Visit  Medication Sig Dispense Refill  . acetaminophen (TYLENOL) 325 MG tablet Take 650 mg by mouth every 6 (six) hours as needed for mild pain.     .[name]  ALPRAZolam (XANAX) 1 MG tablet Take 1 mg by mouth 3 (three) times daily as needed for anxiety.     . cholecalciferol (VITAMIN D) 1000 UNITS tablet Take 1,000 Units by mouth daily.    . citalopram (CELEXA) 20 MG tablet Take 20 mg by mouth 2 (two) times daily.     . clotrimazole-betamethasone (LOTRISONE) cream Apply 1 application topically 2 (two) times daily. Apply to ear    . dexamethasone (DECADRON) 1 MG tablet Take 3 tablets alternating with 2 tablets every other day 90 tablet 3  . docusate sodium (COLACE) 100 MG capsule Take 2 capsules (200 mg total) by mouth daily. (Patient taking differently: Take 200 mg by mouth daily as needed for mild constipation. ) 10 capsule 0  . donepezil (ARICEPT) 10 MG tablet Take 1 tablet (10 mg total) by mouth at bedtime. 30 tablet 5  .  fenofibrate 160 MG tablet Take 160 mg by mouth at bedtime.    . furosemide (LASIX) 80 MG tablet Take 40 mg by mouth daily.     [name] HYDROcodone-acetaminophen (NORCO/VICODIN) 5-325 MG per tablet Take 1 tablet by mouth every 6 (six) hours as needed. 20 tablet 0  . levothyroxine (SYNTHROID, LEVOTHROID) 100 MCG tablet Take 100 mcg by mouth daily.      . Linaclotide (LINZESS) 145 MCG CAPS capsule Take 2 capsules (290 mcg total) by mouth daily. 30 capsule 5  . metFORMIN (GLUCOPHAGE) 1000 MG tablet Take 1,000 mg by mouth 2 (two) times daily with a meal.     . metoprolol succinate (TOPROL-XL) 100 MG 24 hr tablet Take 1 tablet (100 mg total) by mouth daily. Take with or immediately following a meal. 90 tablet 3  . morphine (MS CONTIN) 30 MG 12 hr tablet Take 30 mg by mouth 2 (two) times daily. To treat pain associated with 3 bulging disc in back    . nitroGLYCERIN (NITROSTAT) 0.4 MG SL tablet Place 0.4 mg under the tongue every 5 (five) minutes as needed for chest pain.     . pantoprazole (PROTONIX) 40 MG tablet Take 40 mg by mouth 2 (two) times daily.    . Potassium Chloride Crys CR (KLOR-CON M20 PO) Take 20 mEq by mouth daily.     . traMADol (ULTRAM) 50 MG tablet Take 1 tablet (50 mg total) by mouth every 6 (six) hours as needed. 20 tablet 0  . vitamin B-12 (CYANOCOBALAMIN) 1000 MCG tablet Take 1,000 mcg by mouth daily.    [name] zolpidem (AMBIEN) 10 MG tablet Take 1 tablet (10 mg total) by mouth at bedtime as needed for sleep. 30 tablet 2   No current facility-administered medications on file prior to visit.    ROS:  Out of a complete 14 system review of symptoms, the patient complains only of the following symptoms, and all other reviewed systems are negative.  Headache Leg weakness  Blood pressure 95/48, pulse 56, height  (1.676 m), weight 172 lb (78.019 kg).  Physical Exam  General: The patient is alert and cooperative at the time of the examination.  Skin: 3+ edema below the knees is noted  bilaterally.   Neurologic Exam  Mental status: The Mini-Mental Status Examination done today shows a total score 28/30. Animal fluency test score was 7.  Cranial nerves: Facial symmetry is present. Speech is normal, no aphasia or dysarthria is noted. Extraocular movements are full. Visual fields are full with the right eye, the patient has light perception only on the left. Pupils are  equal, round, and reactive to light. Discs are flat bilaterally.  Motor: The patient has good strength in all 4 extremities.  Sensory examination: Soft touch sensation is symmetric on the face, arms, and legs.  Coordination: The patient has good finger-nose-finger and heel-to-shin bilaterally.  Gait and station: The patient has a slightly wide-based, slightly unsteady gait. Tandem gait was not attempted. Romberg is negative, but is unsteady, the patient will lean backwards. No drift is seen.  Reflexes: Deep tendon reflexes are symmetric.   Assessment/Plan:  1. Temporal arteritis  2. Memory disturbance  3. Headache  4. Gait disturbance  5. Peripheral edema  Blood work will be done today to check the sedimentation rate. The Decadron dosing hopefully can be reduced again. The gabapentin will be increased taking 200 mg 3 times daily. The patient will follow-up in 4 months. She will contact me if any new issues arise. The patient is on Aricept taking 10 mg at night.   Marlan Palau MD 08/25/2014 6:58 PM  [facility] [address] [place], [place] [address]  Phone [phone] Fax [phone]

## 2014-08-26 ENCOUNTER — Telehealth: Payer: Self-pay | Admitting: Neurology

## 2014-08-26 LAB — SEDIMENTATION RATE: SED RATE: 11 mm/h (ref 0–40)

## 2014-08-26 NOTE — Telephone Encounter (Signed)
I called patient. The sedimentation rate was 11, the patient can drop the dose of the Decadron. I records indicate that she was taking 2 mg alternating with 3 mg every other day. She indicates that she was only taking 2 mg daily. We will drop the dose to a 1 mg daily dose. We will recheck the sedimentation rate in about 3 months.

## 2014-08-31 ENCOUNTER — Telehealth: Payer: Self-pay

## 2014-08-31 NOTE — Telephone Encounter (Signed)
Patient calling for a refill zolpidem (AMBIEN) 10 MG tablet . Patient states she only has two pills left . Patient thinks her care taker is taking her RX. Patient requesting a early refill.

## 2014-08-31 NOTE — Telephone Encounter (Signed)
I called the patient. We are unable to get early refills, the bottle has 2 more refills on her, should be due within the next 3 or 4 days.

## 2014-09-01 ENCOUNTER — Ambulatory Visit (HOSPITAL_COMMUNITY)
Admission: RE | Admit: 2014-09-01 | Discharge: 2014-09-01 | Disposition: A | Discharge status: Home / Self Care | Payer: Medicare HMO | Source: Ambulatory Visit | Attending: Family Medicine | Admitting: Family Medicine

## 2014-09-01 ENCOUNTER — Other Ambulatory Visit (HOSPITAL_COMMUNITY): Payer: Self-pay | Admitting: Family Medicine

## 2014-09-01 DIAGNOSIS — J9811 Atelectasis: Secondary | ICD-10-CM | POA: Insufficient documentation

## 2014-09-01 DIAGNOSIS — M549 Dorsalgia, unspecified: Secondary | ICD-10-CM | POA: Diagnosis not present

## 2014-09-01 DIAGNOSIS — R05 Cough: Secondary | ICD-10-CM | POA: Diagnosis not present

## 2014-09-01 DIAGNOSIS — R0600 Dyspnea, unspecified: Secondary | ICD-10-CM

## 2014-09-01 DIAGNOSIS — I517 Cardiomegaly: Secondary | ICD-10-CM | POA: Diagnosis not present

## 2014-09-01 DIAGNOSIS — R079 Chest pain, unspecified: Secondary | ICD-10-CM | POA: Diagnosis not present

## 2014-09-10 ENCOUNTER — Ambulatory Visit: Payer: Medicare HMO | Admitting: Adult Health

## 2014-09-15 ENCOUNTER — Telehealth: Payer: Self-pay | Admitting: Neurology

## 2014-09-15 NOTE — Telephone Encounter (Signed)
I called the patient, left a message. The dexamethasone dose has been reduced. The patient is on Ambien which could result in nightmares. We may have to go up slightly on the dexamethasone dosing.

## 2014-09-15 NOTE — Telephone Encounter (Signed)
Patient is calling in regard to Rx Dexamethasone 1 mg. She is having nightmares, headaches, and decrease appetite.  Please call.

## 2014-09-16 ENCOUNTER — Encounter: Payer: Self-pay | Admitting: *Deleted

## 2014-09-16 ENCOUNTER — Ambulatory Visit: Payer: Medicare HMO | Admitting: Physician Assistant

## 2014-09-16 MED ORDER — SUVOREXANT 20 MG PO TABS: 20.0000 mg | ORAL_TABLET | Freq: Every evening | ORAL | Status: DC | PRN

## 2014-09-16 NOTE — Telephone Encounter (Signed)
I called the patient. The patient is having some nightmares, could be related to the Ambien or to the Aricept. I will change her to Belsomra at night for sleep. She will contact me if she continues to have issues.

## 2014-09-18 ENCOUNTER — Telehealth: Payer: Self-pay | Admitting: Neurology

## 2014-09-18 NOTE — Telephone Encounter (Signed)
I called patient. I got a reminder to call her to get the carotid Doppler study set up to evaluate her for the right carotid stenosis that was found on Doppler study one year ago. If she is okay with having the study done, she is to contact our office and let us know, I will get the study set up.

## 2014-09-21 ENCOUNTER — Telehealth: Payer: Self-pay | Admitting: Neurology

## 2014-09-21 NOTE — Telephone Encounter (Signed)
I called back.  They asked that we fax clinical notes to them at [phone] to determine if they will grant an exception for this drug.  All info has been sent.  Ref Case # [id number]

## 2014-09-21 NOTE — Telephone Encounter (Signed)
[facility] @ [phone], stated Rx Suvorexant (BELSOMRA) 20 MG TABS not located on formulary list.  Questioning if patient could take alternative Rx.  Please call and advise.

## 2014-09-22 ENCOUNTER — Telehealth: Payer: Self-pay

## 2014-09-22 NOTE — Telephone Encounter (Signed)
Kara [name] has approved the request for coverage on Belsomra effective until 07/24/2015 Ref Case # [id number]  Ref ID # [id number]

## 2014-09-24 ENCOUNTER — Other Ambulatory Visit: Payer: Self-pay | Admitting: Neurology

## 2014-09-24 ENCOUNTER — Ambulatory Visit (INDEPENDENT_AMBULATORY_CARE_PROVIDER_SITE_OTHER): Payer: Medicare HMO | Admitting: Cardiology

## 2014-09-24 ENCOUNTER — Encounter: Payer: Self-pay | Admitting: Cardiology

## 2014-09-24 ENCOUNTER — Ambulatory Visit: Payer: Medicare HMO | Admitting: Cardiology

## 2014-09-24 VITALS — BP 108/58 | HR 62 | Ht 66.0 in | Wt 169.0 lb

## 2014-09-24 DIAGNOSIS — I2510 Atherosclerotic heart disease of native coronary artery without angina pectoris: Secondary | ICD-10-CM

## 2014-09-24 DIAGNOSIS — I429 Cardiomyopathy, unspecified: Secondary | ICD-10-CM

## 2014-09-24 DIAGNOSIS — R0789 Other chest pain: Secondary | ICD-10-CM

## 2014-09-24 DIAGNOSIS — R002 Palpitations: Secondary | ICD-10-CM

## 2014-09-24 DIAGNOSIS — H5462 Unqualified visual loss, left eye, normal vision right eye: Secondary | ICD-10-CM

## 2014-09-24 NOTE — Patient Instructions (Signed)
Your physician wants you to follow-up in: 6 months with Dr.McDowell You will receive a reminder letter in the mail two months in advance. If you don't receive a letter, please call our office to schedule the follow-up appointment.     Your physician recommends that you continue on your current medications as directed. Please refer to the Current Medication list given to you today.     Thank you for choosing [facility] !        

## 2014-09-24 NOTE — Progress Notes (Signed)
Cardiology Office Note  Date: 09/24/2014   ID: Kara ShirtsCarolyn W Vansickle, DOB 03/13/1946, MRN [id number]  PCP: Alice ReichertMCINNIS,ANGUS G, MD  Primary Cardiologist: Nona DellSamuel Amaad Byers, MD   Chief Complaint  Patient presents with  . Cardiomyopathy  . Palpitations    History of Present Illness: Kara [name] is a 69 y.o. female last seen by Ms. Lawrence NP in January. At that time she was complaining of increased palpitations, noted to be on steroids, inhalers, and having increased caffeine intake at that point. She comes in today for a routine visit. She has a variety of different complaints including trouble with anxiety and dissatisfaction with her current "nerve medicines," vague pleuritic left-sided chest pain that would "get better if I could just reach in and pull it out," intermittent palpitations without any obvious precipitant. She continues on chronic oxygen with history of COPD.  I reviewed her medications, she remains on Toprol-XL, heart rate and blood pressure are normal today.  Her last echocardiogram from October 2015 showed improvement in LVEF to approximately 50%. PASP was 46 mmHg at that time.  She lives at home, reports that she has assistance with her medicines from a friend who happens to be her landlord. She does not exercise or get out very much by her account.   Past Medical History  Diagnosis Date  . Type 2 diabetes mellitus   . Arthritis   . COPD (chronic obstructive pulmonary disease)     Home oxygen  . Palpitations     Reports history of atrial fibrillation, although none seen recently  . Coronary atherosclerosis of native coronary artery     Nonobstructive at catheterization 2008  . GERD (gastroesophageal reflux disease)   . Irritable bowel syndrome   . Hypothyroidism   . Hyperlipidemia   . Chronic edema   . Pneumonia   . Essential hypertension   . Chronic pain   . Anemia of chronic disease   . Bulging lumbar disc     3  . Headache(784.0)   . Sprain of neck     . Asthma   . Visual loss, left eye   . CKD (chronic kidney disease), stage II   . Claustrophobia   . Temporal arteritis   . Memory difficulty     Past Surgical History  Procedure Laterality Date  . Cholecystectomy    . Appendectomy    . Foot surgery    . Abdominal hysterectomy    . Cataract extraction w/phaco Left 09/01/2013    Procedure: CATARACT EXTRACTION PHACO AND INTRAOCULAR LENS PLACEMENT (IOC);  Surgeon: Gemma PayorKerry Hunt, MD;  Location: [facility];  Service: Ophthalmology;  Laterality: Left;  CDE:  14.13  . Artery biopsy Left 09/22/2013    Procedure: BIOPSY TEMPORAL ARTERY;  Surgeon: Mariella SaaBenjamin T Hoxworth, MD;  Location: [facility];  Service: General;  Laterality: Left;    Current Outpatient Prescriptions  Medication Sig Dispense Refill  . acetaminophen (TYLENOL) 325 MG tablet Take 650 mg by mouth every 6 (six) hours as needed for mild pain.     [name]. ALPRAZolam (XANAX) 1 MG tablet Take 1 mg by mouth 3 (three) times daily as needed for anxiety.     . citalopram (CELEXA) 20 MG tablet Take 20 mg by mouth 2 (two) times daily.     . clotrimazole-betamethasone (LOTRISONE) cream Apply 1 application topically 2 (two) times daily. Apply to ear    . dexamethasone (DECADRON) 1 MG tablet Take 3 tablets alternating with 2 tablets every other day (Patient  taking differently: Take 1 mg by mouth daily with breakfast. ) 90 tablet 3  . docusate sodium (COLACE) 100 MG capsule Take 2 capsules (200 mg total) by mouth daily. (Patient taking differently: Take 200 mg by mouth daily as needed for mild constipation. ) 10 capsule 0  . donepezil (ARICEPT) 10 MG tablet Take 1 tablet (10 mg total) by mouth at bedtime. 30 tablet 5  . fenofibrate 160 MG tablet Take 160 mg by mouth at bedtime.    . furosemide (LASIX) 80 MG tablet Take 40 mg by mouth daily.     [name] gabapentin (NEURONTIN) 100 MG capsule Take 2 capsules (200 mg total) by mouth 3 (three) times daily. 180 capsule 3  . HYDROcodone-acetaminophen (NORCO/VICODIN) 5-325 MG per  tablet Take 1 tablet by mouth every 6 (six) hours as needed. 20 tablet 0  . levothyroxine (SYNTHROID, LEVOTHROID) 100 MCG tablet Take 100 mcg by mouth daily.      . Linaclotide (LINZESS) 145 MCG CAPS capsule Take 2 capsules (290 mcg total) by mouth daily. 30 capsule 5  . metFORMIN (GLUCOPHAGE) 1000 MG tablet Take 1,000 mg by mouth 2 (two) times daily with a meal.     . metoprolol succinate (TOPROL-XL) 100 MG 24 hr tablet Take 1 tablet (100 mg total) by mouth daily. Take with or immediately following a meal. 90 tablet 3  . morphine (MS CONTIN) 30 MG 12 hr tablet Take 30 mg by mouth 2 (two) times daily. To treat pain associated with 3 bulging disc in back    . nitroGLYCERIN (NITROSTAT) 0.4 MG SL tablet Place 0.4 mg under the tongue every 5 (five) minutes as needed for chest pain.     . pantoprazole (PROTONIX) 40 MG tablet Take 40 mg by mouth 2 (two) times daily.    . Potassium Chloride Crys CR (KLOR-CON M20 PO) Take 20 mEq by mouth daily.     . Suvorexant (BELSOMRA) 20 MG TABS Take 20 mg by mouth at bedtime as needed. 30 tablet 1  . traMADol (ULTRAM) 50 MG tablet Take 1 tablet (50 mg total) by mouth every 6 (six) hours as needed. 20 tablet 0   No current facility-administered medications for this visit.    Allergies:  Prednisone and Codeine   Social History: The patient  reports that she quit smoking about 18 years ago. Her smoking use included Cigarettes. She started smoking about 55 years ago. She has a 48 pack-year smoking history. She has never used smokeless tobacco. She reports that she does not drink alcohol or use illicit drugs.    ROS:  Please see the history of present illness. Otherwise, complete review of systems is positive for none.  All other systems are reviewed and negative.    Physical Exam: VS:  BP 108/58 mmHg  Pulse 62  Ht  (1.676 m)  Wt 169 lb (76.658 kg)  BMI 27.29 kg/m2  SpO2 99%, BMI Body mass index is 27.29 kg/(m^2).  Wt Readings from Last 3 Encounters:    09/24/14 169 lb (76.658 kg)  08/25/14 172 lb (78.019 kg)  08/22/14 166 lb (75.297 kg)     Overweight chronically ill-appearing woman, appears comfortable at rest. Wearing oxygen. HEENT: Conjunctiva and lids normal, oropharynx clear. Neck: Supple, no elevated JVP left carotid bruit, no thyromegaly. Lungs: Decreased breath sounds, nonlabored breathing at rest. Cardiac: Regular rate and rhythm, no S3 soft systolic murmur, no pericardial rub. Abdomen: Soft, nontender, bowel sounds present, no guarding or rebound. Extremities: Trace edema,  distal pulses 2+. Skin: Warm and dry. Musculoskeletal: No kyphosis. Neuropsychiatric: Alert and oriented x3, affect grossly appropriate.   ECG: ECG is not ordered today.  Recent Labwork: 10/02/2013: Pro B Natriuretic peptide (BNP) 4681.0* 07/14/2014: ALT 14; AST 22; BUN 40*; Creatinine 1.21*; Hemoglobin 8.9*; Platelets 192; Potassium 3.8; Sodium 137   Other Studies Reviewed Today:  Echocardiogram 05/08/2014:  Study Conclusions  - Procedure narrative: Transthoracic echocardiography. Image quality was suboptimal. The study was technically difficult, as a result of poor sound wave transmission and body habitus. - Left ventricle: Left ventricular systolic function appeared to be low normal, estimated EF 50%. The cavity size was mildly dilated. Wall thickness was normal. Doppler parameters are consistent with abnormal left ventricular relaxation (grade 1 diastolic dysfunction). Mildly elevated left atrial pressure. - Aortic valve: There was mild regurgitation. - Mitral valve: Mildly thickened leaflets . There was mild eccentric regurgitation. - Left atrium: The atrium was mildly dilated. - Right atrium: The atrium was mildly dilated. - Tricuspid valve: There was mild-moderate regurgitation. - Pulmonic valve: There was mild regurgitation. - Pulmonary arteries: PA peak pressure: 46 mm Hg (S). Moderately elevated pulmonary  pressures. - Systemic veins: Mildly dilated IVC with normal respiratory variation. Estimated CVP 8 mmHg.   ASSESSMENT AND PLAN:  1. Atypical chest pain, possibly related to her chronic lung disease. His is been more a long-term recurrent problem based on discussion with her today.  2. Chronic history of recurring palpitations without definitive arrhythmia based on most recent cardiac monitoring and ECGs. She in fact mentioned that she was having palpitations as I was listening to her heart today, which at the time was regular in the 60s. We will continue beta blocker therapy.  3. History of cardiomyopathy, LVEF 50% by most recent echocardiogram.  4. History of nonobstructive CAD. Not convinced that patient's chronic chest pain symptoms are anginal.   Current medicines are reviewed at length with the patient today.  The patient has concerns regarding medicines. She states that she is not satisfied with her "nerve medicines" mentioning Xanax specifically. I asked her to address this with her primary care provider Dr. Renard Matter.   Disposition: FU with me in 6 months.   Signed, Jonelle Sidle, MD, [facility] 09/24/2014 10:13 AM    [facility] at [facility] [address], [place], [place] [address] Phone: [phone]; Fax: [phone]

## 2014-09-29 ENCOUNTER — Ambulatory Visit (INDEPENDENT_AMBULATORY_CARE_PROVIDER_SITE_OTHER): Payer: Medicare HMO | Admitting: Internal Medicine

## 2014-10-07 ENCOUNTER — Other Ambulatory Visit: Payer: Medicare HMO

## 2014-10-07 ENCOUNTER — Telehealth: Payer: Self-pay | Admitting: Neurology

## 2014-10-07 NOTE — Telephone Encounter (Signed)
Pt called to cancel appointment she has a bad cold and would like to reschedule for another day, but wants to keep the same time. Please call back and if no answer please leave a detailed message.

## 2014-10-07 NOTE — Telephone Encounter (Signed)
Called patient about rescheduling her carotid doppler appointment.  She was not available.  Will call back tomorrow.

## 2014-10-12 NOTE — Telephone Encounter (Signed)
Patient returning call to reschedule Carotid doppler, please call cell # [phone] (caregiver's cell).  Would prefer appt around 10:00 or 11:00 am.  Please call and advise.

## 2014-10-13 ENCOUNTER — Telehealth: Payer: Self-pay | Admitting: Radiology

## 2014-10-13 NOTE — Telephone Encounter (Signed)
S/W pt. And scheduled Carotid doppelr for 10/21/14 @ 10:30, arrive 10:15.  Patient to bring her own portable tank for O2.

## 2014-10-21 ENCOUNTER — Ambulatory Visit (INDEPENDENT_AMBULATORY_CARE_PROVIDER_SITE_OTHER): Payer: Medicare HMO

## 2014-10-21 DIAGNOSIS — H5462 Unqualified visual loss, left eye, normal vision right eye: Secondary | ICD-10-CM | POA: Diagnosis not present

## 2014-10-21 DIAGNOSIS — I2510 Atherosclerotic heart disease of native coronary artery without angina pectoris: Secondary | ICD-10-CM | POA: Diagnosis not present

## 2014-10-27 ENCOUNTER — Telehealth: Payer: Self-pay | Admitting: Neurology

## 2014-10-27 NOTE — Telephone Encounter (Signed)
I called patient. A carotid Doppler study done one year ago showed 50-69% stenosis of the right internal carotid artery. This current study suggests no stenosis on either side.

## 2014-10-29 ENCOUNTER — Other Ambulatory Visit (HOSPITAL_COMMUNITY): Payer: Self-pay | Admitting: Pulmonary Disease

## 2014-10-29 ENCOUNTER — Ambulatory Visit (HOSPITAL_COMMUNITY)
Admission: RE | Admit: 2014-10-29 | Discharge: 2014-10-29 | Disposition: A | Discharge status: Home / Self Care | Payer: Medicare HMO | Source: Ambulatory Visit | Attending: Pulmonary Disease | Admitting: Pulmonary Disease

## 2014-10-29 DIAGNOSIS — E785 Hyperlipidemia, unspecified: Secondary | ICD-10-CM | POA: Insufficient documentation

## 2014-10-29 DIAGNOSIS — R0602 Shortness of breath: Secondary | ICD-10-CM | POA: Diagnosis not present

## 2014-10-29 DIAGNOSIS — E119 Type 2 diabetes mellitus without complications: Secondary | ICD-10-CM | POA: Insufficient documentation

## 2014-10-29 DIAGNOSIS — J449 Chronic obstructive pulmonary disease, unspecified: Secondary | ICD-10-CM | POA: Insufficient documentation

## 2014-10-29 DIAGNOSIS — N182 Chronic kidney disease, stage 2 (mild): Secondary | ICD-10-CM | POA: Diagnosis not present

## 2014-10-29 DIAGNOSIS — I129 Hypertensive chronic kidney disease with stage 1 through stage 4 chronic kidney disease, or unspecified chronic kidney disease: Secondary | ICD-10-CM | POA: Insufficient documentation

## 2014-10-29 DIAGNOSIS — J45909 Unspecified asthma, uncomplicated: Secondary | ICD-10-CM | POA: Insufficient documentation

## 2014-10-30 ENCOUNTER — Other Ambulatory Visit (INDEPENDENT_AMBULATORY_CARE_PROVIDER_SITE_OTHER): Payer: Self-pay | Admitting: Internal Medicine

## 2014-11-24 ENCOUNTER — Telehealth: Payer: Self-pay | Admitting: Neurology

## 2014-11-24 DIAGNOSIS — Z5181 Encounter for therapeutic drug level monitoring: Secondary | ICD-10-CM

## 2014-11-24 DIAGNOSIS — M316 Other giant cell arteritis: Secondary | ICD-10-CM

## 2014-11-24 MED ORDER — DEXAMETHASONE 2 MG PO TABS: 2.0000 mg | ORAL_TABLET | Freq: Two times a day (BID) | ORAL | Status: DC

## 2014-11-24 MED ORDER — DEXAMETHASONE 0.5 MG PO TABS: ORAL_TABLET | ORAL | Status: DC

## 2014-11-24 NOTE — Telephone Encounter (Signed)
I called patient. The patient is to come in and get a sedimentation rate drawn at her convenience. I will put the order in.

## 2014-11-24 NOTE — Telephone Encounter (Signed)
A recent sedimentation rate was drawn on 11/10/2014. CBC shows a hemoglobin of 10.7, sedimentation rate was 17. Creatinine is 1.24 BUN is oriented to, GFR is 43. Potassium was 3.3.  I called patient. The patient has been taking 1 mg of Decadron daily. We will reduce the dose to 0.5 mg alternating with 1 mg every other day.

## 2014-11-25 ENCOUNTER — Telehealth: Payer: Self-pay | Admitting: Neurology

## 2014-11-25 NOTE — Telephone Encounter (Signed)
Pam from [facility] called wanting to get clarification for  dexamethasone (DECADRON) 0.5 MG tablet and dexamethasone (DECADRON) 2 MG.  Pam can be reached @ [phone]

## 2014-11-25 NOTE — Telephone Encounter (Signed)
Per note yesterday: The patient has been taking 1 mg of Decadron daily. We will reduce the dose to 0.5 mg alternating with 1 mg every other day.  I called back. Spoke with Kandis FantasiaPam.  Relayed Dr Anne HahnWillis' message.  She has noted the patient file.

## 2014-11-26 ENCOUNTER — Telehealth (INDEPENDENT_AMBULATORY_CARE_PROVIDER_SITE_OTHER): Payer: Self-pay | Admitting: *Deleted

## 2014-11-26 NOTE — Telephone Encounter (Signed)
Patient would like to precede with Flex Sigmoidoscopy verses coming to office for an appointment. She ask that Ann call her 11/27/14.  Patient states that she is real constipated. She started Colace today and is using Miralax as prescribed.The Linzess 145 mcg is not working. She has ask about a stronger dose of the Linzess.

## 2014-11-27 ENCOUNTER — Telehealth (INDEPENDENT_AMBULATORY_CARE_PROVIDER_SITE_OTHER): Payer: Self-pay | Admitting: *Deleted

## 2014-11-27 ENCOUNTER — Other Ambulatory Visit (INDEPENDENT_AMBULATORY_CARE_PROVIDER_SITE_OTHER): Payer: Self-pay | Admitting: *Deleted

## 2014-11-27 DIAGNOSIS — K5909 Other constipation: Secondary | ICD-10-CM

## 2014-11-27 NOTE — Telephone Encounter (Signed)
Patient aware.

## 2014-11-27 NOTE — Telephone Encounter (Signed)
Had patient scheduled on 12/04/14 for flex sig and once I gave her instructions for procedure she told me to cancel it, she was too sick to do that and she would get back with us. I advised patient that Dr Mariel SleetNeijstrom had called Dr Karilyn Cotaehman personally and she needed to call Dr Mariel SleetNeijstrom and let him know she canceled -- I called Dr Thornton PapasNeijstrom's office and left message nurses voicemail

## 2014-11-27 NOTE — Telephone Encounter (Signed)
Flex Sig sch'd 12/04/14 245 (145) -- on 5/12 @ 7 pm do enema, NPO after midnight and on 5/13 @ 8 am & 11 am do enema, left message @ [phone] for patient to call me so I can give date, time & instructions

## 2014-11-28 NOTE — Telephone Encounter (Signed)
Please let Dr. Tally DueNeijtrom's office know of the plan

## 2014-12-02 ENCOUNTER — Other Ambulatory Visit: Payer: Self-pay | Admitting: Neurology

## 2014-12-03 NOTE — Telephone Encounter (Signed)
Rx has been signed and faxed  

## 2014-12-04 ENCOUNTER — Ambulatory Visit: Admit: 2014-12-04 | Payer: Self-pay | Admitting: Internal Medicine

## 2014-12-04 SURGERY — SIGMOIDOSCOPY, FLEXIBLE
Anesthesia: Moderate Sedation

## 2014-12-09 ENCOUNTER — Ambulatory Visit (INDEPENDENT_AMBULATORY_CARE_PROVIDER_SITE_OTHER): Payer: Medicare HMO | Admitting: Internal Medicine

## 2014-12-11 ENCOUNTER — Ambulatory Visit (INDEPENDENT_AMBULATORY_CARE_PROVIDER_SITE_OTHER): Payer: Medicare HMO | Admitting: Internal Medicine

## 2014-12-17 ENCOUNTER — Encounter (INDEPENDENT_AMBULATORY_CARE_PROVIDER_SITE_OTHER): Payer: Self-pay | Admitting: Internal Medicine

## 2014-12-17 ENCOUNTER — Encounter (INDEPENDENT_AMBULATORY_CARE_PROVIDER_SITE_OTHER): Payer: Self-pay | Admitting: *Deleted

## 2014-12-17 ENCOUNTER — Ambulatory Visit (INDEPENDENT_AMBULATORY_CARE_PROVIDER_SITE_OTHER): Payer: Medicare HMO | Admitting: Internal Medicine

## 2014-12-17 ENCOUNTER — Other Ambulatory Visit (INDEPENDENT_AMBULATORY_CARE_PROVIDER_SITE_OTHER): Payer: Self-pay | Admitting: *Deleted

## 2014-12-17 VITALS — BP 100/60 | HR 64 | Temp 98.5°F | Ht 66.5 in | Wt 168.0 lb

## 2014-12-17 DIAGNOSIS — R110 Nausea: Secondary | ICD-10-CM

## 2014-12-17 DIAGNOSIS — R935 Abnormal findings on diagnostic imaging of other abdominal regions, including retroperitoneum: Secondary | ICD-10-CM

## 2014-12-17 LAB — COMPREHENSIVE METABOLIC PANEL
AST: 15 U/L (ref 0–37)
Albumin: 3.3 g/dL — ABNORMAL LOW (ref 3.5–5.2)
Alkaline Phosphatase: 43 U/L (ref 39–117)
BUN: 20 mg/dL (ref 6–23)
CO2: 35 mEq/L — ABNORMAL HIGH (ref 19–32)
Calcium: 9 mg/dL (ref 8.4–10.5)
Chloride: 104 mEq/L (ref 96–112)
Creat: 0.93 mg/dL (ref 0.50–1.10)
Glucose, Bld: 113 mg/dL — ABNORMAL HIGH (ref 70–99)
Potassium: 4.5 mEq/L (ref 3.5–5.3)
Sodium: 143 mEq/L (ref 135–145)
Total Bilirubin: 0.3 mg/dL (ref 0.2–1.2)
Total Protein: 5.4 g/dL — ABNORMAL LOW (ref 6.0–8.3)

## 2014-12-17 LAB — CBC WITH DIFFERENTIAL/PLATELET
Basophils Absolute: 0 10*3/uL (ref 0.0–0.1)
Basophils Relative: 0 % (ref 0–1)
EOS ABS: 0.1 10*3/uL (ref 0.0–0.7)
EOS PCT: 2 % (ref 0–5)
HEMATOCRIT: 24 % — AB (ref 36.0–46.0)
Hemoglobin: 7.3 g/dL — ABNORMAL LOW (ref 12.0–15.0)
Lymphocytes Relative: 29 % (ref 12–46)
Lymphs Abs: 1.2 10*3/uL (ref 0.7–4.0)
MCH: 28.4 pg (ref 26.0–34.0)
MCHC: 30.4 g/dL (ref 30.0–36.0)
MCV: 93.4 fL (ref 78.0–100.0)
MONO ABS: 0.3 10*3/uL (ref 0.1–1.0)
MPV: 10 fL (ref 8.6–12.4)
Monocytes Relative: 7 % (ref 3–12)
Neutro Abs: 2.5 10*3/uL (ref 1.7–7.7)
Neutrophils Relative %: 62 % (ref 43–77)
Platelets: 175 10*3/uL (ref 150–400)
RBC: 2.57 MIL/uL — ABNORMAL LOW (ref 3.87–5.11)
RDW: 16.7 % — AB (ref 11.5–15.5)
WBC: 4.1 10*3/uL (ref 4.0–10.5)

## 2014-12-17 NOTE — Patient Instructions (Signed)
Flex. Sigmoidoscopy.The risks and benefits such as perforation, bleeding, and infection were reviewed with the patient and is agreeable.

## 2014-12-17 NOTE — Progress Notes (Addendum)
Subjective:    Patient ID: Kara [name], female    DOB: 08-May-1946, 69 y.o.   MRN: [id number]  HPI presents today with c/o nauseated on her stomach x 2 months. There is on nausea. Hx of chronic nausea.  Appetite is okay. She has gained two pounds since her last visit. Usually has some nausea about 3 times a day.  She eats what she can. Has some epigastric pain. No acid reflux. Acid reflux controlled with Protonix. She usually has a BM every 3 days. No melena or BRRB.  NO NSAIDs. Underwent a CT in December 2015 for acute onset of diarrhea and sensation of constipation. Unable to eat or drink. Follow up sigmoidoscopy advised   On 02 24 hrs.    Patient's last colonoscopy was in March 2005 revealing left-sided diverticulosis and external hemorrhoids.  07/07/2014 CT abdomen/pelvis:  Acute onset of diarrhea and sensation of constipation. Unable to eat or drink.   IMPRESSION: 1. Focal soft tissue inflammation and diffuse wall thickening at the rectum, compatible with acute proctitis. Associated presacral stranding seen. This likely explains the patient's symptoms. After treatment of the acute process, would perform follow-up sigmoidoscopy to exclude an underlying mass. 2. Stable appearance to retroperitoneal soft tissue density surrounding the aortic bifurcation, right common iliac vessels and IVC. 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 3. Scattered calcification along the abdominal aorta and its branches, with mild intramural thrombus at the mid abdominal aorta. No evidence of luminal narrowing. 4. Worsening right renal atrophy noted. Tiny right renal cyst seen. No evidence of recurrence of hydronephrosis. 5. Scattered diverticulosis noted along the proximal sigmoid colon, without evidence of diverticulitis.  CBC    Component Value Date/Time   WBC 10.7* 07/14/2014 2019   WBC 9.9 12/31/2013 1010   RBC 2.86*  07/14/2014 2019   RBC 2.79* 12/31/2013 1010   RBC 2.78* 06/09/2013 1425   HGB 8.9* 07/14/2014 2019   HCT 28.4* 07/14/2014 2019   PLT 192 07/14/2014 2019   MCV 99.3 07/14/2014 2019   MCH 31.1 07/14/2014 2019   MCH 31.5 12/31/2013 1010   MCHC 31.3 07/14/2014 2019   MCHC 32.8 12/31/2013 1010   RDW 13.5 07/14/2014 2019   RDW 13.0 12/31/2013 1010   LYMPHSABS 1.9 07/14/2014 2019   LYMPHSABS 2.5 12/31/2013 1010   MONOABS 1.0 07/14/2014 2019   EOSABS 0.1 07/14/2014 2019   EOSABS 0.1 12/31/2013 1010   BASOSABS 0.0 07/14/2014 2019   BASOSABS 0.0 12/31/2013 1010       Review of Systems Divorced. Two children in good health.     Past Medical History  Diagnosis Date  . Type 2 diabetes mellitus   . Arthritis   . COPD (chronic obstructive pulmonary disease)     Home oxygen  . Palpitations     Reports history of atrial fibrillation, although none seen recently  . Coronary atherosclerosis of native coronary artery     Nonobstructive at catheterization 2008  . GERD (gastroesophageal reflux disease)   . Irritable bowel syndrome   . Hypothyroidism   . Hyperlipidemia   . Chronic edema   . Pneumonia   . Essential hypertension   . Chronic pain   . Anemia of chronic disease   . Bulging lumbar disc     3  . Headache(784.0)   . Sprain of neck   . Asthma   . Visual loss, left eye   . CKD (chronic kidney disease), stage II   .  Claustrophobia   . Temporal arteritis   . Memory difficulty     Past Surgical History  Procedure Laterality Date  . Cholecystectomy    . Appendectomy    . Foot surgery    . Abdominal hysterectomy    . Cataract extraction w/phaco Left 09/01/2013    Procedure: CATARACT EXTRACTION PHACO AND INTRAOCULAR LENS PLACEMENT (IOC);  Surgeon: Gemma Payor, MD;  Location: [facility];  Service: Ophthalmology;  Laterality: Left;  CDE:  14.13  . Artery biopsy Left 09/22/2013    Procedure: BIOPSY TEMPORAL ARTERY;  Surgeon: Mariella Saa, MD;  Location: [facility];  Service:  General;  Laterality: Left;    Allergies  Allergen Reactions  . Prednisone Other (See Comments)    Can have in small doses  . Codeine Nausea And Vomiting and Rash    Current Outpatient Prescriptions on File Prior to Visit  Medication Sig Dispense Refill  . acetaminophen (TYLENOL) 325 MG tablet Take 650 mg by mouth every 6 (six) hours as needed for mild pain.     [name] ALPRAZolam (XANAX) 1 MG tablet Take 1 mg by mouth 3 (three) times daily as needed for anxiety.     . BELSOMRA 20 MG TABS TAKE ONE TABLET BY MOUTH AT BEDTIME AS NEEDED 30 tablet 3  . citalopram (CELEXA) 20 MG tablet Take 20 mg by mouth 2 (two) times daily.     . clotrimazole-betamethasone (LOTRISONE) cream Apply 1 application topically 2 (two) times daily. Apply to ear    . dexamethasone (DECADRON) 0.5 MG tablet 1 tablet alternating with 2 tablets every other day (Patient taking differently: 1/2 tab then a whole every other day) 60 tablet 3  . docusate sodium (COLACE) 100 MG capsule Take 2 capsules (200 mg total) by mouth daily. (Patient taking differently: Take 200 mg by mouth daily as needed for mild constipation. ) 10 capsule 0  . donepezil (ARICEPT) 10 MG tablet Take 1 tablet (10 mg total) by mouth at bedtime. 30 tablet 5  . fenofibrate 160 MG tablet Take 160 mg by mouth at bedtime.    . furosemide (LASIX) 80 MG tablet Take 40 mg by mouth daily.     [name] gabapentin (NEURONTIN) 100 MG capsule Take 2 capsules (200 mg total) by mouth 3 (three) times daily. 180 capsule 3  . levothyroxine (SYNTHROID, LEVOTHROID) 100 MCG tablet Take 100 mcg by mouth daily.      . Linaclotide (LINZESS) 145 MCG CAPS capsule Take 2 capsules (290 mcg total) by mouth daily. 30 capsule 5  . metFORMIN (GLUCOPHAGE) 1000 MG tablet Take 1,000 mg by mouth 2 (two) times daily with a meal.     . metoprolol succinate (TOPROL-XL) 100 MG 24 hr tablet Take 1 tablet (100 mg total) by mouth daily. Take with or immediately following a meal. 90 tablet 3  . morphine (MS  CONTIN) 30 MG 12 hr tablet Take 30 mg by mouth 2 (two) times daily. To treat pain associated with 3 bulging disc in back    . nitroGLYCERIN (NITROSTAT) 0.4 MG SL tablet Place 0.4 mg under the tongue every 5 (five) minutes as needed for chest pain.     . pantoprazole (PROTONIX) 40 MG tablet Take 40 mg by mouth 2 (two) times daily.    . polyethylene glycol powder (GLYCOLAX/MIRALAX) powder DISSOLVE 17 GRAMS IN 8 OUNCES OF WATER 2TIMES A DAY 1054 g 2  . Potassium Chloride Crys CR (KLOR-CON M20 PO) Take 20 mEq by mouth daily.     [name]  dexamethasone (DECADRON) 2 MG tablet Take 1 tablet (2 mg total) by mouth 2 (two) times daily with a meal. (Patient not taking: Reported on 12/17/2014) 24 tablet 0   No current facility-administered medications on file prior to visit.        Objective:   Physical Exam Blood pressure 100/60, pulse 64, temperature 98.5 F (36.9 C), height 5' 6.5" (1.689 m), weight 168 lb (76.204 kg).  Alert and oriented. Skin warm and dry. Oral mucosa is moist.   . Sclera anicteric, conjunctivae is pink. Thyroid not enlarged. No cervical lymphadenopathy. Lungs clear. Heart regular rate and rhythm.  Abdomen is soft. Bowel sounds are positive. No hepatomegaly. No abdominal masses felt. LL abdominal  tenderness.  1+ edema to lower extremities.  ( Patient has 40% vision).      Assessment & Plan:  Chronic nausea. ? Etiology. Will get a CBC and CMET. Hx of same.  Abnormal CT:   Will reschedule a Flex sigmoid.

## 2014-12-18 ENCOUNTER — Ambulatory Visit (HOSPITAL_COMMUNITY)
Admission: RE | Admit: 2014-12-18 | Discharge: 2014-12-18 | Disposition: A | Discharge status: Home / Self Care | Payer: Medicare HMO | Source: Ambulatory Visit | Attending: Internal Medicine | Admitting: Internal Medicine

## 2014-12-18 ENCOUNTER — Encounter (HOSPITAL_COMMUNITY): Payer: Self-pay

## 2014-12-18 DIAGNOSIS — D638 Anemia in other chronic diseases classified elsewhere: Secondary | ICD-10-CM | POA: Insufficient documentation

## 2014-12-18 LAB — HEMOGLOBIN AND HEMATOCRIT, BLOOD
HEMATOCRIT: 25 % — AB (ref 36.0–46.0)
HEMOGLOBIN: 7.4 g/dL — AB (ref 12.0–15.0)

## 2014-12-18 NOTE — Progress Notes (Signed)
Results for Lanice ShirtsMEADOR, Raylynn W (MRN [id number]) as of 12/18/2014 15:21 Coming 12/22/2014 for 2 units PC   Ref. Range 12/18/2014 14:30  Hemoglobin Latest Ref Range: 12.0-15.0 g/dL 7.4 (L)  HCT Latest Ref Range: 36.0-46.0 % 25.0 (L)

## 2014-12-18 NOTE — Discharge Instructions (Signed)

## 2014-12-22 ENCOUNTER — Telehealth (INDEPENDENT_AMBULATORY_CARE_PROVIDER_SITE_OTHER): Payer: Self-pay | Admitting: *Deleted

## 2014-12-22 ENCOUNTER — Encounter (HOSPITAL_COMMUNITY): Admission: RE | Admit: 2014-12-22 | Payer: Medicare HMO | Source: Ambulatory Visit

## 2014-12-22 NOTE — Telephone Encounter (Signed)
[name] LM asking to speak with Dr. Karilyn Cotaehman if possible. Returned the call and patient said she was wanting to talk with him about her stomach problems, lots of nausea. Eber JonesCarolyn said she would just speak with him in the am at the hospital.  The return phone number is [phone].

## 2014-12-22 NOTE — Telephone Encounter (Signed)
Forwarded to Dr.Rehman for review. 

## 2014-12-23 ENCOUNTER — Encounter (HOSPITAL_COMMUNITY)
Admission: RE | Admit: 2014-12-23 | Discharge: 2014-12-23 | Disposition: A | Discharge status: Home / Self Care | Payer: Medicare HMO | Source: Ambulatory Visit | Attending: Internal Medicine | Admitting: Internal Medicine

## 2014-12-23 DIAGNOSIS — D638 Anemia in other chronic diseases classified elsewhere: Secondary | ICD-10-CM | POA: Diagnosis not present

## 2014-12-23 LAB — HEMOGLOBIN AND HEMATOCRIT, BLOOD
HCT: 31.4 % — ABNORMAL LOW (ref 36.0–46.0)
Hemoglobin: 9.7 g/dL — ABNORMAL LOW (ref 12.0–15.0)

## 2014-12-23 LAB — PREPARE RBC (CROSSMATCH)

## 2014-12-23 MED ORDER — ACETAMINOPHEN 325 MG PO TABS
ORAL_TABLET | ORAL | Status: AC
  Filled 2014-12-23: qty 2

## 2014-12-23 MED ORDER — SODIUM CHLORIDE 0.9 % IV SOLN: Freq: Once | INTRAVENOUS | Status: DC

## 2014-12-23 MED ORDER — DIPHENHYDRAMINE HCL 25 MG PO CAPS
25.0000 mg | ORAL_CAPSULE | Freq: Once | ORAL | Status: AC
  Administered 2014-12-23: 25 mg via ORAL

## 2014-12-23 MED ORDER — DIPHENHYDRAMINE HCL 25 MG PO CAPS
ORAL_CAPSULE | ORAL | Status: AC
  Filled 2014-12-23: qty 1

## 2014-12-23 MED ORDER — ACETAMINOPHEN 325 MG PO TABS: 650.0000 mg | ORAL_TABLET | Freq: Once | ORAL | Status: DC

## 2014-12-23 NOTE — Progress Notes (Signed)
Did not give patient tylenol because she had taken 2 500 mg tylenol at home prior to coming to hospital.

## 2014-12-23 NOTE — Telephone Encounter (Signed)
Patient was called yesterday and there was no answer

## 2014-12-24 LAB — TYPE AND SCREEN
ABO/RH(D): O NEG
Antibody Screen: NEGATIVE
UNIT DIVISION: 0
UNIT DIVISION: 0

## 2014-12-24 NOTE — Progress Notes (Signed)
Results for Kara [name], [name] (MRN [id number]) as of 12/24/2014 08:25 post transfusion H&H from 12/23/2014  Ref. Range 12/23/2014 13:45  Hemoglobin Latest Ref Range: 12.0-15.0 g/dL 9.7 (L)  HCT Latest Ref Range: 36.0-46.0 % 31.4 (L)

## 2014-12-25 ENCOUNTER — Encounter (HOSPITAL_COMMUNITY): Admission: RE | Payer: Self-pay | Source: Ambulatory Visit

## 2014-12-25 ENCOUNTER — Ambulatory Visit (HOSPITAL_COMMUNITY): Admission: RE | Admit: 2014-12-25 | Payer: Medicare HMO | Source: Ambulatory Visit | Admitting: Internal Medicine

## 2014-12-25 ENCOUNTER — Ambulatory Visit: Payer: Medicare HMO | Admitting: Neurology

## 2014-12-25 SURGERY — SIGMOIDOSCOPY, FLEXIBLE
Anesthesia: Moderate Sedation

## 2014-12-31 ENCOUNTER — Encounter: Payer: Medicare HMO | Admitting: Adult Health

## 2014-12-31 ENCOUNTER — Ambulatory Visit: Payer: Medicare HMO | Admitting: Adult Health

## 2014-12-31 NOTE — Progress Notes (Signed)
Cardiology Office Note   ERROR  

## 2015-01-04 ENCOUNTER — Telehealth: Payer: Self-pay | Admitting: Neurology

## 2015-01-04 ENCOUNTER — Ambulatory Visit: Payer: Medicare HMO | Admitting: Neurology

## 2015-01-04 NOTE — Telephone Encounter (Signed)
The patient calls today indicating that she has had some increased problems with headache on the lower dose of Decadron. Unfortunately, she did not show up for her revisit appointment today. We will try to get this appointment rescheduled.

## 2015-01-04 NOTE — Telephone Encounter (Addendum)
Patient is calling in and states that Rx Dexamethasone 1 mg taken 1/2 tablet 1 day then 1 tablet next day is a reduced dosage for her. Is this a reason for the patient to be having terrible headaches.  She states she was unable to come in for her appointment this morning because she was so sick.  Please call.

## 2015-01-04 NOTE — Telephone Encounter (Signed)
I called the patient. She stated she started having terrible headaches once she cut her Decadron dosage in half. She stated she is going to lay down and try to sleep and to please leave a message if she is not able to answer.

## 2015-01-04 NOTE — Telephone Encounter (Signed)
This patient canceled same day of appointment.

## 2015-01-04 NOTE — Telephone Encounter (Signed)
I called the patient. No answer and no voicemail set up.

## 2015-01-05 ENCOUNTER — Emergency Department (HOSPITAL_COMMUNITY)
Admission: EM | Admit: 2015-01-05 | Discharge: 2015-01-05 | Disposition: A | Discharge status: Home / Self Care | Payer: Medicare HMO | Attending: Emergency Medicine | Admitting: Emergency Medicine

## 2015-01-05 ENCOUNTER — Emergency Department (HOSPITAL_COMMUNITY): Payer: Medicare HMO

## 2015-01-05 ENCOUNTER — Encounter (HOSPITAL_COMMUNITY): Payer: Self-pay | Admitting: Emergency Medicine

## 2015-01-05 DIAGNOSIS — Z79899 Other long term (current) drug therapy: Secondary | ICD-10-CM | POA: Insufficient documentation

## 2015-01-05 DIAGNOSIS — R51 Headache: Secondary | ICD-10-CM | POA: Insufficient documentation

## 2015-01-05 DIAGNOSIS — J449 Chronic obstructive pulmonary disease, unspecified: Secondary | ICD-10-CM | POA: Insufficient documentation

## 2015-01-05 DIAGNOSIS — N182 Chronic kidney disease, stage 2 (mild): Secondary | ICD-10-CM | POA: Insufficient documentation

## 2015-01-05 DIAGNOSIS — K219 Gastro-esophageal reflux disease without esophagitis: Secondary | ICD-10-CM | POA: Diagnosis not present

## 2015-01-05 DIAGNOSIS — H5462 Unqualified visual loss, left eye, normal vision right eye: Secondary | ICD-10-CM | POA: Diagnosis not present

## 2015-01-05 DIAGNOSIS — R0789 Other chest pain: Secondary | ICD-10-CM | POA: Diagnosis not present

## 2015-01-05 DIAGNOSIS — Z87891 Personal history of nicotine dependence: Secondary | ICD-10-CM | POA: Insufficient documentation

## 2015-01-05 DIAGNOSIS — R600 Localized edema: Secondary | ICD-10-CM | POA: Diagnosis not present

## 2015-01-05 DIAGNOSIS — I2510 Atherosclerotic heart disease of native coronary artery without angina pectoris: Secondary | ICD-10-CM | POA: Diagnosis not present

## 2015-01-05 DIAGNOSIS — Z8701 Personal history of pneumonia (recurrent): Secondary | ICD-10-CM | POA: Diagnosis not present

## 2015-01-05 DIAGNOSIS — D631 Anemia in chronic kidney disease: Secondary | ICD-10-CM | POA: Diagnosis not present

## 2015-01-05 DIAGNOSIS — E119 Type 2 diabetes mellitus without complications: Secondary | ICD-10-CM | POA: Diagnosis not present

## 2015-01-05 DIAGNOSIS — M1990 Unspecified osteoarthritis, unspecified site: Secondary | ICD-10-CM | POA: Insufficient documentation

## 2015-01-05 DIAGNOSIS — R519 Headache, unspecified: Secondary | ICD-10-CM

## 2015-01-05 DIAGNOSIS — Z9981 Dependence on supplemental oxygen: Secondary | ICD-10-CM | POA: Insufficient documentation

## 2015-01-05 DIAGNOSIS — E039 Hypothyroidism, unspecified: Secondary | ICD-10-CM | POA: Diagnosis not present

## 2015-01-05 DIAGNOSIS — R079 Chest pain, unspecified: Secondary | ICD-10-CM | POA: Diagnosis present

## 2015-01-05 DIAGNOSIS — I129 Hypertensive chronic kidney disease with stage 1 through stage 4 chronic kidney disease, or unspecified chronic kidney disease: Secondary | ICD-10-CM | POA: Insufficient documentation

## 2015-01-05 DIAGNOSIS — E785 Hyperlipidemia, unspecified: Secondary | ICD-10-CM | POA: Diagnosis not present

## 2015-01-05 LAB — COMPREHENSIVE METABOLIC PANEL
ALK PHOS: 45 U/L (ref 38–126)
ALT: 9 U/L — ABNORMAL LOW (ref 14–54)
AST: 17 U/L (ref 15–41)
Albumin: 3.5 g/dL (ref 3.5–5.0)
Anion gap: 7 (ref 5–15)
BUN: 21 mg/dL — ABNORMAL HIGH (ref 6–20)
CALCIUM: 8.9 mg/dL (ref 8.9–10.3)
CO2: 34 mmol/L — AB (ref 22–32)
Chloride: 101 mmol/L (ref 101–111)
Creatinine, Ser: 0.99 mg/dL (ref 0.44–1.00)
GFR calc non Af Amer: 57 mL/min — ABNORMAL LOW (ref >60)
GLUCOSE: 106 mg/dL — AB (ref 65–99)
Potassium: 5 mmol/L (ref 3.5–5.1)
Sodium: 142 mmol/L (ref 135–145)
Total Bilirubin: 0.5 mg/dL (ref 0.3–1.2)
Total Protein: 6.1 g/dL — ABNORMAL LOW (ref 6.5–8.1)

## 2015-01-05 LAB — CBC WITH DIFFERENTIAL/PLATELET
BASOS PCT: 0 % (ref 0–1)
Basophils Absolute: 0 10*3/uL (ref 0.0–0.1)
EOS ABS: 0 10*3/uL (ref 0.0–0.7)
Eosinophils Relative: 1 % (ref 0–5)
HCT: 32.1 % — ABNORMAL LOW (ref 36.0–46.0)
Hemoglobin: 9.6 g/dL — ABNORMAL LOW (ref 12.0–15.0)
Lymphocytes Relative: 20 % (ref 12–46)
Lymphs Abs: 1 10*3/uL (ref 0.7–4.0)
MCH: 30 pg (ref 26.0–34.0)
MCHC: 29.9 g/dL — AB (ref 30.0–36.0)
MCV: 100.3 fL — ABNORMAL HIGH (ref 78.0–100.0)
MONOS PCT: 6 % (ref 3–12)
Monocytes Absolute: 0.3 10*3/uL (ref 0.1–1.0)
NEUTROS ABS: 3.5 10*3/uL (ref 1.7–7.7)
Neutrophils Relative %: 73 % (ref 43–77)
PLATELETS: 165 10*3/uL (ref 150–400)
RBC: 3.2 MIL/uL — ABNORMAL LOW (ref 3.87–5.11)
RDW: 14.7 % (ref 11.5–15.5)
WBC: 4.8 10*3/uL (ref 4.0–10.5)

## 2015-01-05 LAB — TROPONIN I: Troponin I: <0.03 ng/mL (ref <0.031)

## 2015-01-05 LAB — BRAIN NATRIURETIC PEPTIDE: B NATRIURETIC PEPTIDE 5: 1473 pg/mL — AB (ref 0.0–100.0)

## 2015-01-05 LAB — D-DIMER, QUANTITATIVE (NOT AT ARMC): D-Dimer, Quant: <0.27 ug/mL-FEU (ref 0.00–0.48)

## 2015-01-05 MED ORDER — ACETAMINOPHEN 325 MG PO TABS
ORAL_TABLET | ORAL | Status: AC
  Filled 2015-01-05: qty 2

## 2015-01-05 MED ORDER — NITROGLYCERIN 2 % TD OINT
1.0000 [in_us] | TOPICAL_OINTMENT | Freq: Once | TRANSDERMAL | Status: DC
  Filled 2015-01-05: qty 1

## 2015-01-05 MED ORDER — ASPIRIN 81 MG PO CHEW
324.0000 mg | CHEWABLE_TABLET | Freq: Once | ORAL | Status: DC
  Filled 2015-01-05: qty 4

## 2015-01-05 MED ORDER — ACETAMINOPHEN 325 MG PO TABS
650.0000 mg | ORAL_TABLET | Freq: Once | ORAL | Status: AC
  Administered 2015-01-05: 650 mg via ORAL

## 2015-01-05 NOTE — Discharge Instructions (Signed)
Follow up with Dr Diona Browner this week. You have an appointment in 2 days, you can call the office today to see if he will see you sooner. Follow up with Dr Anne Hahn for your chronic headaches and your dexamethasone medication for your temporal arteritis.  Chest Pain (Nonspecific) It is often hard to give a diagnosis for the cause of chest pain. There is always a chance that your pain could be related to something serious, such as a heart attack or a blood clot in the lungs. You need to follow up with your doctor. HOME CARE  If antibiotic medicine was given, take it as directed by your doctor. Finish the medicine even if you start to feel better.  For the next few days, avoid activities that bring on chest pain. Continue physical activities as told by your doctor.  Do not use any tobacco products. This includes cigarettes, chewing tobacco, and e-cigarettes.  Avoid drinking alcohol.  Only take medicine as told by your doctor.  Follow your doctor's suggestions for more testing if your chest pain does not go away.  Keep all doctor visits you made. GET HELP IF:  Your chest pain does not go away, even after treatment.  You have a rash with blisters on your chest.  You have a fever. GET HELP RIGHT AWAY IF:   You have more pain or pain that spreads to your arm, neck, jaw, back, or belly (abdomen).  You have shortness of breath.  You cough more than usual or cough up blood.  You have very bad back or belly pain.  You feel sick to your stomach (nauseous) or throw up (vomit).  You have very bad weakness.  You pass out (faint).  You have chills. This is an emergency. Do not wait to see if the problems will go away. Call your local emergency services (911 in [place] Do not drive yourself to the hospital. MAKE SURE YOU:   Understand these instructions.  Will watch your condition.  Will get help right away if you are not doing well or get worse. Document Released: 12/27/2007 Document  Revised: 07/15/2013 Document Reviewed: 12/27/2007 [facility] Patient Information 2015 [place], [place]. This information is not intended to replace advice given to you by your health care provider. Make sure you discuss any questions you have with your health care provider.

## 2015-01-05 NOTE — Telephone Encounter (Signed)
I called the patient and left a voicemail.

## 2015-01-05 NOTE — ED Notes (Signed)
Pt refused to take meds ordered by the EDP; EDP made aware and stated to make a note

## 2015-01-05 NOTE — ED Provider Notes (Signed)
CSN: [id number]     Arrival date & time 01/05/15  0348 History   First MD Initiated Contact with Patient 01/05/15 0353     Chief Complaint  Patient presents with  . Chest Pain     (Consider location/radiation/quality/duration/timing/severity/associated sxs/prior Treatment) HPI patient reports she had chest pain a couple years ago when she was having a flareup of her congestive heart failure. She was recently seen by Dr. Diona Browner about 2 weeks ago. She states she's been getting chest pain off and on for the past 2 weeks. She is unable to specify how long it lasts or where it is located. Last night according to her husband about 9:00 she started complaining of chest pain that has been there constantly. She states the pain started underneath her left breast and then started going to the center of her chest and then over on the right side and it radiates back and forth between her right lower and left lower chest. She also states the pain radiates up into both shoulders. She states it made her feel short of breath and she broke out in a sweat. She also had nausea without vomiting. Reports she's had increased swelling of her legs over the past 3-4 weeks and her right leg is more swollen than the left which they state is typical after an old injury from her dog.  PCP Dr Renard Matter Cardiology Dr Diona Browner, has an appt in 2 days  Past Medical History  Diagnosis Date  . Type 2 diabetes mellitus   . Arthritis   . COPD (chronic obstructive pulmonary disease)     Home oxygen  . Palpitations     Reports history of atrial fibrillation, although none seen recently  . Coronary atherosclerosis of native coronary artery     Nonobstructive at catheterization 2008  . GERD (gastroesophageal reflux disease)   . Irritable bowel syndrome   . Hypothyroidism   . Hyperlipidemia   . Chronic edema   . Pneumonia   . Essential hypertension   . Chronic pain   . Anemia of chronic disease   . Bulging lumbar disc     3    . Headache(784.0)   . Sprain of neck   . Asthma   . Visual loss, left eye   . CKD (chronic kidney disease), stage II   . Claustrophobia   . Temporal arteritis   . Memory difficulty    Past Surgical History  Procedure Laterality Date  . Cholecystectomy    . Appendectomy    . Foot surgery    . Abdominal hysterectomy    . Cataract extraction w/phaco Left 09/01/2013    Procedure: CATARACT EXTRACTION PHACO AND INTRAOCULAR LENS PLACEMENT (IOC);  Surgeon: Gemma Payor, MD;  Location: [facility];  Service: Ophthalmology;  Laterality: Left;  CDE:  14.13  . Artery biopsy Left 09/22/2013    Procedure: BIOPSY TEMPORAL ARTERY;  Surgeon: Mariella Saa, MD;  Location: [facility] OR;  Service: General;  Laterality: Left;   Family History  Problem Relation Age of Onset  . Hypertension    . Diabetes Brother   . Migraines Brother    History  Substance Use Topics  . Smoking status: Former Smoker -- 1.00 packs/day for 48 years    Types: Cigarettes    Start date: 05/26/1959    Quit date: 12/28/1995  . Smokeless tobacco: Never Used  . Alcohol Use: No   Patient wears oxygen 3 L/m nasal cannula 24/7 Patient lives at home Patient lives with  spouse   OB History    Gravida Para Term Preterm AB TAB SAB Ectopic Multiple Living   1 1 1             Review of Systems  All other systems reviewed and are negative.     Allergies  Prednisone and Codeine  Home Medications   Prior to Admission medications   Medication Sig Start Date End Date Taking? Authorizing Provider  ALPRAZolam Prudy Feeler) 1 MG tablet Take 1 mg by mouth 3 (three) times daily as needed for anxiety.    Yes Historical Provider, MD  BELSOMRA 20 MG TABS TAKE ONE TABLET BY MOUTH AT BEDTIME AS NEEDED 12/02/14  Yes York Spaniel, MD  citalopram (CELEXA) 20 MG tablet Take 20 mg by mouth 2 (two) times daily.    Yes Historical Provider, MD  clotrimazole-betamethasone (LOTRISONE) cream Apply 1 application topically 2 (two) times daily. Apply to ear    Yes Historical Provider, MD  dexamethasone (DECADRON) 0.5 MG tablet 1 tablet alternating with 2 tablets every other day Patient taking differently: 1/2 tab then a whole every other day 11/24/14  Yes York Spaniel, MD  dexamethasone (DECADRON) 1 MG tablet Take 1 mg by mouth every other day. And half tablet on all other days.   Yes Historical Provider, MD  docusate sodium (COLACE) 100 MG capsule Take 2 capsules (200 mg total) by mouth daily. Patient taking differently: Take 200 mg by mouth daily as needed for mild constipation.  07/13/14  Yes Malissa Hippo, MD  donepezil (ARICEPT) 10 MG tablet Take 1 tablet (10 mg total) by mouth at bedtime. 08/20/14  Yes York Spaniel, MD  fenofibrate 160 MG tablet Take 160 mg by mouth at bedtime.   Yes Historical Provider, MD  folic acid (FOLVITE) 1 MG tablet Take 1 mg by mouth. 10/13/14 10/13/15 Yes Historical Provider, MD  furosemide (LASIX) 80 MG tablet Take 40 mg by mouth daily.    Yes Historical Provider, MD  levothyroxine (SYNTHROID, LEVOTHROID) 100 MCG tablet Take 100 mcg by mouth daily.     Yes Historical Provider, MD  metFORMIN (GLUCOPHAGE) 1000 MG tablet Take 1,000 mg by mouth 2 (two) times daily with a meal.    Yes Historical Provider, MD  metoprolol succinate (TOPROL-XL) 100 MG 24 hr tablet Take 1 tablet (100 mg total) by mouth daily. Take with or immediately following a meal. 05/01/14  Yes Jonelle Sidle, MD  morphine (MS CONTIN) 30 MG 12 hr tablet Take 30 mg by mouth 2 (two) times daily. To treat pain associated with 3 bulging disc in back   Yes Historical Provider, MD  pantoprazole (PROTONIX) 40 MG tablet Take 40 mg by mouth 2 (two) times daily.   Yes Historical Provider, MD  Potassium Chloride Crys CR (KLOR-CON M20 PO) Take 20 mEq by mouth daily.    Yes Historical Provider, MD  spironolactone (ALDACTONE) 25 MG tablet Take 25 mg by mouth daily.   Yes Historical Provider, MD  acetaminophen (TYLENOL) 500 MG tablet Take 1,000 mg by mouth 3 (three)  times daily.    Historical Provider, MD  acetaminophen (TYLENOL) 500 MG tablet Take 1,000 mg by mouth 3 (three) times daily.    Historical Provider, MD  gabapentin (NEURONTIN) 100 MG capsule Take 2 capsules (200 mg total) by mouth 3 (three) times daily. 08/25/14   York Spaniel, MD  Linaclotide [facility]) 145 MCG CAPS capsule Take 2 capsules (290 mcg total) by mouth daily. 07/13/14  Malissa Hippo, MD  nitroGLYCERIN (NITROSTAT) 0.4 MG SL tablet Place 0.4 mg under the tongue every 5 (five) minutes as needed for chest pain.     Historical Provider, MD  ondansetron (ZOFRAN) 4 MG tablet Take 4 mg by mouth.    Historical Provider, MD  polyethylene glycol powder (GLYCOLAX/MIRALAX) powder 2 (two) times daily. 10/30/14   Historical Provider, MD   BP 168/55 mmHg  Pulse 61  Temp(Src) 98.3 F (36.8 C) (Oral)  Resp 16  SpO2 100%  Vital signs normal   Physical Exam  Constitutional: She is oriented to person, place, and time. She appears well-developed and well-nourished.  Non-toxic appearance. She does not appear ill. No distress.  HENT:  Head: Normocephalic and atraumatic.  Right Ear: External ear normal.  Left Ear: External ear normal.  Nose: Nose normal. No mucosal edema or rhinorrhea.  Mouth/Throat: Oropharynx is clear and moist and mucous membranes are normal. No dental abscesses or uvula swelling.  Eyes: Conjunctivae and EOM are normal. Pupils are equal, round, and reactive to light.  Neck: Normal range of motion and full passive range of motion without pain. Neck supple.  Cardiovascular: Normal rate, regular rhythm and normal heart sounds.  Exam reveals no gallop and no friction rub.   No murmur heard. Pulmonary/Chest: Effort normal. No respiratory distress. She has decreased breath sounds. She has no wheezes. She has no rhonchi. She has no rales. She exhibits no tenderness and no crepitus.    Patient has diffuse diminished breath sounds without wheezing. She has very minimal air movement.    Abdominal: Soft. Normal appearance and bowel sounds are normal. She exhibits no distension. There is no tenderness. There is no rebound and no guarding.  Musculoskeletal: Normal range of motion. She exhibits edema. She exhibits no tenderness.  Patient has diffuse edema of both legs however the right is worse than the left, see photo  Neurological: She is alert and oriented to person, place, and time. She has normal strength. No cranial nerve deficit.  Skin: Skin is warm, dry and intact. No rash noted. No erythema. No pallor.  Psychiatric: She has a normal mood and affect. Her speech is normal and behavior is normal. Her mood appears not anxious.  Nursing note and vitals reviewed.     ED Course  Procedures (including critical care time)  Medications  nitroGLYCERIN (NITROGLYN) 2 % ointment 1 inch (1 inch Topical Not Given 01/05/15 0448)  aspirin chewable tablet 324 mg (324 mg Oral Not Given 01/05/15 0448)  acetaminophen (TYLENOL) tablet 650 mg (650 mg Oral Given 01/05/15 0513)    Patient refused nitroglycerin and aspirin. She states both give her headache.  Review of Dr. Clarisa Kindred notes, her neurologist, shows patient had temporal arteritis and was on dexamethasone. That has been tapered off in the past few months because her sedimentation rate had normalized. He reports she has chronic headaches since a head injury in 2014 and she continues to complain of a lot of headaches. He describes 3+ pitting edema of her lower extremities in February.  Patient's labs show no evidence of acute MI, screen for PE/DVT is negative, and chest x-ray does not show pneumonia. Patient was given her test results. She has an appointment in 2 days with her cardiologist. She is advised to call his office today and see if he needs to see her sooner. Patient is being discharged from the ED. She refused most medication offered. Her pulse ox is 100% on her 3 L nasal cannula  oxygen. She was not given any extra diuretic  because her chest x-ray did not show any excess fluid.  Labs Review Results for orders placed or performed during the hospital encounter of 01/05/15  Comprehensive metabolic panel  Result Value Ref Range   Sodium 142 135 - 145 mmol/L   Potassium 5.0 3.5 - 5.1 mmol/L   Chloride 101 101 - 111 mmol/L   CO2 34 (H) 22 - 32 mmol/L   Glucose, Bld 106 (H) 65 - 99 mg/dL   BUN 21 (H) 6 - 20 mg/dL   Creatinine, Ser 1.61 0.44 - 1.00 mg/dL   Calcium 8.9 8.9 - 09.6 mg/dL   Total Protein 6.1 (L) 6.5 - 8.1 g/dL   Albumin 3.5 3.5 - 5.0 g/dL   AST 17 15 - 41 U/L   ALT 9 (L) 14 - 54 U/L   Alkaline Phosphatase 45 38 - 126 U/L   Total Bilirubin 0.5 0.3 - 1.2 mg/dL   GFR calc non Af Amer 57 (L) >60 mL/min   GFR calc Af Amer >60 >60 mL/min   Anion gap 7 5 - 15  Brain natriuretic peptide  Result Value Ref Range   B Natriuretic Peptide 1473.0 (H) 0.0 - 100.0 pg/mL  Troponin I  Result Value Ref Range   Troponin I <0.03 <0.031 ng/mL  CBC with Differential  Result Value Ref Range   WBC 4.8 4.0 - 10.5 K/uL   RBC 3.20 (L) 3.87 - 5.11 MIL/uL   Hemoglobin 9.6 (L) 12.0 - 15.0 g/dL   HCT 04.5 (L) 40.9 - 81.1 %   MCV 100.3 (H) 78.0 - 100.0 fL   MCH 30.0 26.0 - 34.0 pg   MCHC 29.9 (L) 30.0 - 36.0 g/dL   RDW 91.4 78.2 - 95.6 %   Platelets 165 150 - 400 K/uL   Neutrophils Relative % 73 43 - 77 %   Neutro Abs 3.5 1.7 - 7.7 K/uL   Lymphocytes Relative 20 12 - 46 %   Lymphs Abs 1.0 0.7 - 4.0 K/uL   Monocytes Relative 6 3 - 12 %   Monocytes Absolute 0.3 0.1 - 1.0 K/uL   Eosinophils Relative 1 0 - 5 %   Eosinophils Absolute 0.0 0.0 - 0.7 K/uL   Basophils Relative 0 0 - 1 %   Basophils Absolute 0.0 0.0 - 0.1 K/uL  D-dimer, quantitative (not at [facility])  Result Value Ref Range   D-Dimer, Quant <0.27 0.00 - 0.48 ug/mL-FEU   Laboratory interpretation all normal except except for anemia, elevation of BNP without baseline   Imaging Review Dg Chest Port 1 View  01/05/2015   CLINICAL DATA:  Central chest pain  since yesterday around 1600 hours.  EXAM: PORTABLE CHEST - 1 VIEW  COMPARISON:  10/29/2014  FINDINGS: Cardiac enlargement. No vascular congestion. Probable emphysematous changes in the upper lungs. No focal airspace disease or consolidation. No blunting of costophrenic angles. No pneumothorax. Tortuous aorta. No change since previous study.  IMPRESSION: Cardiac enlargement. Emphysematous changes. No evidence of active pulmonary disease.   Electronically Signed   By: Burman Nieves M.D.   On: 01/05/2015 04:57     EKG Interpretation   Date/Time:  Tuesday January 05 2015 03:57:39 EDT Ventricular Rate:  61 PR Interval:  174 QRS Duration: 84 QT Interval:  432 QTC Calculation: 435 R Axis:   71 Text Interpretation:  Sinus rhythm Minimal voltage criteria for left  ventricular hypertrophy Since last tracing 02 Oct 2013 T wave inversion no  longer evident in Anterior leads Confirmed by Lyndal Alamillo  MD-I, Nasim Garofano ([id number]) on  01/05/2015 4:02:30 AM      MDM   Final diagnoses:  Atypical chest pain  Chronic intractable headache, unspecified headache type    Plan discharge  Devoria Albe, MD, Concha Pyo, MD 01/05/15 [id number]

## 2015-01-05 NOTE — ED Notes (Signed)
Pt c/o chest pain since yesterday around 1600

## 2015-01-05 NOTE — ED Notes (Signed)
Pt c/o headache, EDP made aware and orders for tylenol given

## 2015-01-06 ENCOUNTER — Telehealth: Payer: Self-pay | Admitting: Neurology

## 2015-01-06 NOTE — Telephone Encounter (Signed)
Patient called and requested to speak with the nurse regarding her headaches. She would like to know if she can move her 6/30 appt to an earlier time. Please call and advise.

## 2015-01-06 NOTE — Telephone Encounter (Signed)
I called the patient. Her headaches are the same. Appointment r/s to 6/22.

## 2015-01-07 ENCOUNTER — Ambulatory Visit (INDEPENDENT_AMBULATORY_CARE_PROVIDER_SITE_OTHER): Payer: Medicare HMO | Admitting: Adult Health

## 2015-01-07 ENCOUNTER — Encounter: Payer: Self-pay | Admitting: Adult Health

## 2015-01-07 VITALS — BP 138/64 | HR 56 | Ht 66.0 in | Wt 168.0 lb

## 2015-01-07 DIAGNOSIS — I5032 Chronic diastolic (congestive) heart failure: Secondary | ICD-10-CM | POA: Diagnosis not present

## 2015-01-07 DIAGNOSIS — I2510 Atherosclerotic heart disease of native coronary artery without angina pectoris: Secondary | ICD-10-CM

## 2015-01-07 DIAGNOSIS — I10 Essential (primary) hypertension: Secondary | ICD-10-CM | POA: Diagnosis not present

## 2015-01-07 NOTE — Progress Notes (Deleted)
Name: Kara [name]    DOB: 05/05/1946  Age: 69 y.o.  MR#: [id number]       PCP:  [name], MD      Insurance: Payor: Monia Pouch MEDICARE / Plan: [employer] MEDICARE HMO/PPO / Product Type: *No Product type* /   CC:    Chief Complaint  Patient presents with  . Cardiomyopathy  . Palpitations    VS Filed Vitals:   01/07/15 1522  BP: 138/64  Pulse: 56  Height: 5\' 6"  (1.676 m)  Weight: 168 lb (76.204 kg)  SpO2: 96%    Weights Current Weight  01/07/15 168 lb (76.204 kg)  12/18/14 168 lb (76.204 kg)  12/17/14 168 lb (76.204 kg)    Blood Pressure  BP Readings from Last 3 Encounters:  01/07/15 138/64  01/05/15 164/57  12/23/14 158/49     Admit date:  (Not on file) Last encounter with RMR:  08/07/2014   Allergy Prednisone and Codeine  Current Outpatient Prescriptions  Medication Sig Dispense Refill  . acetaminophen (TYLENOL) 500 MG tablet Take 1,000 mg by mouth 3 (three) times daily.    [name] ALPRAZolam (XANAX) 1 MG tablet Take 1 mg by mouth 3 (three) times daily as needed for anxiety.     . BELSOMRA 20 MG TABS TAKE ONE TABLET BY MOUTH AT BEDTIME AS NEEDED 30 tablet 3  . citalopram (CELEXA) 20 MG tablet Take 20 mg by mouth 2 (two) times daily.     [name] dexamethasone (DECADRON) 1 MG tablet Take 1 mg by mouth every other day. And half tablet on all other days.    [name] docusate sodium (COLACE) 100 MG capsule Take 2 capsules (200 mg total) by mouth daily. (Patient taking differently: Take 200 mg by mouth daily as needed for mild constipation. ) 10 capsule 0  . donepezil (ARICEPT) 10 MG tablet Take 1 tablet (10 mg total) by mouth at bedtime. 30 tablet 5  . fenofibrate 160 MG tablet Take 160 mg by mouth at bedtime.    . folic acid (FOLVITE) 1 MG tablet Take 1 mg by mouth.    . furosemide (LASIX) 80 MG tablet Take 40 mg by mouth daily.     [name] gabapentin (NEURONTIN) 100 MG capsule Take 2 capsules (200 mg total) by mouth 3 (three) times daily. 180 capsule 3  . levothyroxine (SYNTHROID,  LEVOTHROID) 100 MCG tablet Take 100 mcg by mouth daily.      . Linaclotide (LINZESS) 145 MCG CAPS capsule Take 2 capsules (290 mcg total) by mouth daily. 30 capsule 5  . metFORMIN (GLUCOPHAGE) 1000 MG tablet Take 1,000 mg by mouth 2 (two) times daily with a meal.     . metoprolol succinate (TOPROL-XL) 100 MG 24 hr tablet Take 1 tablet (100 mg total) by mouth daily. Take with or immediately following a meal. 90 tablet 3  . morphine (MS CONTIN) 30 MG 12 hr tablet Take 30 mg by mouth 2 (two) times daily. To treat pain associated with 3 bulging disc in back    . nitroGLYCERIN (NITROSTAT) 0.4 MG SL tablet Place 0.4 mg under the tongue every 5 (five) minutes as needed for chest pain.     [name] ondansetron (ZOFRAN) 4 MG tablet Take 4 mg by mouth.    . pantoprazole (PROTONIX) 40 MG tablet Take 40 mg by mouth 2 (two) times daily.    . polyethylene glycol powder (GLYCOLAX/MIRALAX) powder 2 (two) times daily.    . Potassium Chloride Crys CR (KLOR-CON M20 PO) Take  20 mEq by mouth daily.     [name] spironolactone (ALDACTONE) 25 MG tablet Take 25 mg by mouth daily.    . clotrimazole-betamethasone (LOTRISONE) cream Apply 1 application topically 2 (two) times daily. Apply to ear     No current facility-administered medications for this visit.    Discontinued Meds:    Medications Discontinued During This Encounter  Medication Reason  . acetaminophen (TYLENOL) 500 MG tablet Error  . dexamethasone (DECADRON) 0.5 MG tablet Error    Patient Active Problem List   Diagnosis Date Noted  . Memory difficulty 04/16/2014  . Temporal arteritis 10/14/2013  . 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 Systolic dysfunction 08/22/2013  . Headache 07/30/2013  . GERD (gastroesophageal reflux disease) 10/21/2012  . Renal insufficiency 08/07/2011  . Anemia of chronic disease 08/04/2011  . Chronic diastolic heart failure 07/10/2011  . Lower extremity edema 04/13/2011  . COPD (chronic obstructive pulmonary disease) 04/13/2011  .  Essential hypertension, benign 04/13/2011  . Coronary atherosclerosis of native coronary artery 04/13/2011  . Hypothyroidism 04/13/2011  . Palpitations 04/13/2011    LABS    Component Value Date/Time   NA 142 01/05/2015 0432   NA 143 12/17/2014 1103   NA 137 07/14/2014 2019   NA 139 12/31/2013 1010   K 5.0 01/05/2015 0432   K 4.5 12/17/2014 1103   K 3.8 07/14/2014 2019   CL 101 01/05/2015 0432   CL 104 12/17/2014 1103   CL 93* 07/14/2014 2019   CO2 34* 01/05/2015 0432   CO2 35* 12/17/2014 1103   CO2 35* 07/14/2014 2019   GLUCOSE 106* 01/05/2015 0432   GLUCOSE 113* 12/17/2014 1103   GLUCOSE 99 07/14/2014 2019   GLUCOSE 85 12/31/2013 1010   BUN 21* 01/05/2015 0432   BUN 20 12/17/2014 1103   BUN 40* 07/14/2014 2019   BUN 36* 12/31/2013 1010   CREATININE 0.99 01/05/2015 0432   CREATININE 0.93 12/17/2014 1103   CREATININE 1.21* 07/14/2014 2019   CREATININE 1.05* 12/31/2013 1010   CALCIUM 8.9 01/05/2015 0432   CALCIUM 9.0 12/17/2014 1103   CALCIUM 8.7 07/14/2014 2019   GFRNONAA 57* 01/05/2015 0432   GFRNONAA 45* 07/14/2014 2019   GFRNONAA 55* 12/31/2013 1010   GFRAA >60 01/05/2015 0432   GFRAA 52* 07/14/2014 2019   GFRAA 64 12/31/2013 1010   CMP     Component Value Date/Time   NA 142 01/05/2015 0432   NA 139 12/31/2013 1010   K 5.0 01/05/2015 0432   CL 101 01/05/2015 0432   CO2 34* 01/05/2015 0432   GLUCOSE 106* 01/05/2015 0432   GLUCOSE 85 12/31/2013 1010   BUN 21* 01/05/2015 0432   BUN 36* 12/31/2013 1010   CREATININE 0.99 01/05/2015 0432   CREATININE 0.93 12/17/2014 1103   CALCIUM 8.9 01/05/2015 0432   PROT 6.1* 01/05/2015 0432   PROT 5.5* 12/31/2013 1010   ALBUMIN 3.5 01/05/2015 0432   AST 17 01/05/2015 0432   ALT 9* 01/05/2015 0432   ALKPHOS 45 01/05/2015 0432   BILITOT 0.5 01/05/2015 0432   GFRNONAA 57* 01/05/2015 0432   GFRAA >60 01/05/2015 0432       Component Value Date/Time   WBC 4.8 01/05/2015 0432   WBC 4.1 12/17/2014 1103   WBC 10.7*  07/14/2014 2019   WBC 9.9 12/31/2013 1010   HGB 9.6* 01/05/2015 0432   HGB 9.7* 12/23/2014 1345   HGB 7.4* 12/18/2014 1430   HCT 32.1* 01/05/2015 0432   HCT 31.4* 12/23/2014 1345  HCT 25.0* 12/18/2014 1430   MCV 100.3* 01/05/2015 0432   MCV 93.4 12/17/2014 1103   MCV 99.3 07/14/2014 2019    Lipid Panel  No results found for: CHOL, TRIG, HDL, CHOLHDL, VLDL, LDLCALC, LDLDIRECT  ABG    Component Value Date/Time   PHART 7.385 08/24/2012 1400   PCO2ART 62.5* 08/24/2012 1400   PO2ART 84.5 08/24/2012 1400   HCO3 36.6* 08/24/2012 1400   TCO2 34.9 08/24/2012 1400   O2SAT 98.9 08/24/2012 1400     Lab Results  Component Value Date   TSH 1.249 08/19/2013   BNP (last 3 results)  Recent Labs  01/05/15 0432  BNP 1473.0*    ProBNP (last 3 results) No results for input(s): PROBNP in the last 8760 hours.  Cardiac Panel (last 3 results)  Recent Labs  01/05/15 0432  TROPONINI <0.03    Iron/TIBC/Ferritin/ %Sat    Component Value Date/Time   IRON 70 12/27/2012 1449   TIBC 386 12/27/2012 1449   FERRITIN 465* 06/09/2013 1425   IRONPCTSAT 18* 12/27/2012 1449     EKG Orders placed or performed during the hospital encounter of 01/05/15  . EKG 12-Lead  . EKG 12-Lead  . EKG     Prior Assessment and Plan Problem List as of 01/07/2015      Cardiovascular and Mediastinum   Temporal arteritis   Essential hypertension, benign   Last Assessment & Plan 05/01/2014 Office Visit Written 05/01/2014 11:53 AM by Jonelle Sidle, MD    Medication medications now, might consider an ACE inhibitor or ARB after review of her echocardiogram.      Coronary atherosclerosis of native coronary artery   Last Assessment & Plan 08/07/2014 Office Visit Written 08/07/2014  3:11 PM by Jodelle Gross, NP    I was not completed any new cardiac testing.  Can potentially repeat echocardiogram on followup visit.  His other comorbidities.  I do not know that she would be a candidate for interventional  therapy at this time.      Chronic diastolic heart failure   Last Assessment & Plan 10/20/2013 Office Visit Written 10/20/2013  3:18 PM by Jodelle Gross, NP    No evidence of fluid retention on this assessment today. Continue current regimen.        Respiratory   COPD (chronic obstructive pulmonary disease)   Last Assessment & Plan 07/10/2011 Office Visit Written 07/10/2011  3:46 PM by Jonelle Sidle, MD    Oxygen requiring, also likely impacts her feelings of shortness of breath.        Digestive   GERD (gastroesophageal reflux disease)     Endocrine   Hypothyroidism   Last Assessment & Plan 04/13/2011 Office Visit Written 04/13/2011  4:15 PM by Jonelle Sidle, MD    Need to followup TSH.        Genitourinary   Renal insufficiency   Last Assessment & Plan 05/01/2014 Office Visit Written 05/01/2014 11:53 AM by Jonelle Sidle, MD    Creatinine improved to 0.9 by recent testing.        Other   Headache   Visual loss, left eye   Memory difficulty   Lower extremity edema   Last Assessment & Plan 07/10/2011 Office Visit Written 07/10/2011  3:47 PM by Jonelle Sidle, MD    Much improved.      Palpitations   Last Assessment & Plan 08/07/2014 Office Visit Written 08/07/2014  3:10 PM by Jodelle Gross, NP  She states palpitations arebecoming worse.  After going over her medications.  It is noted that she is on by mouth steroids as well as inhalers, which can be contributing to symptoms.  I have also discussed with her her caffeine intake.  She is drinking 2-3 cans of diet [facility] a day.  I have discussed with her the need to go caffeine free, but to do this slowly, as she has gotten use to high amounts of caffeine during the day.  That in conjunction with steroids can cause a ventricular irritability. I answered multiple questions about this with the patient.  She is willing to begin to cutdown.      Anemia of chronic disease   Systolic dysfunction   Last  Assessment & Plan 10/20/2013 Office Visit Written 10/20/2013  3:16 PM by Jodelle Gross, NP    EF of 40% on last evaluation in January of 2015. Will have follow up appt with Dr Diona Browner and decision will be made to repeat with higher dose of BB, in 6 months.          Imaging: Dg Chest Port 1 View  01/05/2015   CLINICAL DATA:  Central chest pain since yesterday around 1600 hours.  EXAM: PORTABLE CHEST - 1 VIEW  COMPARISON:  10/29/2014  FINDINGS: Cardiac enlargement. No vascular congestion. Probable emphysematous changes in the upper lungs. No focal airspace disease or consolidation. No blunting of costophrenic angles. No pneumothorax. Tortuous aorta. No change since previous study.  IMPRESSION: Cardiac enlargement. Emphysematous changes. No evidence of active pulmonary disease.   Electronically Signed   By: Burman Nieves M.D.   On: 01/05/2015 04:57

## 2015-01-07 NOTE — Progress Notes (Signed)
Cardiology Office Note   Date:  01/07/2015   ID:  Kara [name], DOB 10/23/1945, MRN [id number]  PCP:  [name], MD  Cardiologist:  [place]/ Joni Reining, NP   Chief Complaint  Patient presents with  . Cardiomyopathy  . Palpitations      History of Present Illness: Kara [name] is a 69 y.o. 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  She was last seen in the office by Dr. Diona Browner in March of 2016.  She was seen in the emergency room on 01/05/1999 and 16 in the setting of chest pain, on and off for 2 weeks.  She also noted some increased swelling in her legs.  The patient refused, nitroglycerin, and aspirin, stating they give her a headache.  She was rule out for myocardial infarction and DBT.  Chest x-ray did not show pneumonia or CHF.  She was advised to continue your current medication regimen.  She refused, most medications that were offered to her.  She remained on O2 at 3 L.  And was to followup with [place] post ER visit.  She comes today with multiple somatic complaints include chronic chest pain, headache, weakness,. She goes into detail about her blindness.the patient states she is not taking medication as directed as she adjusts her medications on her own.  The medications we have on our list do not match, which she is taking, but she cannot come in, which ones they are.  She states she has a "friend" who prepares her medicines for her.  She is not take diuretics as directed.  She states she was to be taking 4-80 mg tablets of Lasix. She also states that she is not taking her metoprolol as directed. The patient has several changes and septic during this conversation with confusion.  She is oxygen dependent.  She states she thinks she is on too much medication.   Past Medical  History  Diagnosis Date  . Type 2 diabetes mellitus   . Arthritis   . COPD (chronic obstructive pulmonary disease)     Home oxygen  . Palpitations     Reports history of atrial fibrillation, although none seen recently  . Coronary atherosclerosis of native coronary artery     Nonobstructive at catheterization 2008  . GERD (gastroesophageal reflux disease)   . Irritable bowel syndrome   . Hypothyroidism   . Hyperlipidemia   . Chronic edema   . Pneumonia   . Essential hypertension   . Chronic pain   . Anemia of chronic disease   . Bulging lumbar disc     3  . Headache(784.0)   . Sprain of neck   . Asthma   . Visual loss, left eye   . CKD (chronic kidney disease), stage II   . Claustrophobia   . Temporal arteritis   . Memory difficulty     Past Surgical History  Procedure Laterality Date  . Cholecystectomy    . Appendectomy    . Foot surgery    . Abdominal hysterectomy    . Cataract extraction w/phaco Left 09/01/2013    Procedure: CATARACT EXTRACTION PHACO AND INTRAOCULAR LENS PLACEMENT (IOC);  Surgeon: Gemma Payor, MD;  Location: [facility];  Service: Ophthalmology;  Laterality: Left;  CDE:  14.13  . Artery biopsy Left 09/22/2013    Procedure:  BIOPSY TEMPORAL ARTERY;  Surgeon: Mariella Saa, MD;  Location: [facility] OR;  Service: General;  Laterality: Left;     Current Outpatient Prescriptions  Medication Sig Dispense Refill  . acetaminophen (TYLENOL) 500 MG tablet Take 1,000 mg by mouth 3 (three) times daily.    [name] ALPRAZolam (XANAX) 1 MG tablet Take 1 mg by mouth 3 (three) times daily as needed for anxiety.     . BELSOMRA 20 MG TABS TAKE ONE TABLET BY MOUTH AT BEDTIME AS NEEDED 30 tablet 3  . citalopram (CELEXA) 20 MG tablet Take 20 mg by mouth 2 (two) times daily.     [name] dexamethasone (DECADRON) 1 MG tablet Take 1 mg by mouth every other day. And half tablet on all other days.    [name] docusate sodium (COLACE) 100 MG capsule Take 2 capsules (200 mg total) by mouth daily. (Patient  taking differently: Take 200 mg by mouth daily as needed for mild constipation. ) 10 capsule 0  . donepezil (ARICEPT) 10 MG tablet Take 1 tablet (10 mg total) by mouth at bedtime. 30 tablet 5  . fenofibrate 160 MG tablet Take 160 mg by mouth at bedtime.    . folic acid (FOLVITE) 1 MG tablet Take 1 mg by mouth.    . furosemide (LASIX) 80 MG tablet Take 40 mg by mouth daily.     [name] gabapentin (NEURONTIN) 100 MG capsule Take 2 capsules (200 mg total) by mouth 3 (three) times daily. 180 capsule 3  . levothyroxine (SYNTHROID, LEVOTHROID) 100 MCG tablet Take 100 mcg by mouth daily.      . Linaclotide (LINZESS) 145 MCG CAPS capsule Take 2 capsules (290 mcg total) by mouth daily. 30 capsule 5  . metFORMIN (GLUCOPHAGE) 1000 MG tablet Take 1,000 mg by mouth 2 (two) times daily with a meal.     . metoprolol succinate (TOPROL-XL) 100 MG 24 hr tablet Take 1 tablet (100 mg total) by mouth daily. Take with or immediately following a meal. 90 tablet 3  . morphine (MS CONTIN) 30 MG 12 hr tablet Take 30 mg by mouth 2 (two) times daily. To treat pain associated with 3 bulging disc in back    . nitroGLYCERIN (NITROSTAT) 0.4 MG SL tablet Place 0.4 mg under the tongue every 5 (five) minutes as needed for chest pain.     [name] ondansetron (ZOFRAN) 4 MG tablet Take 4 mg by mouth.    . pantoprazole (PROTONIX) 40 MG tablet Take 40 mg by mouth 2 (two) times daily.    . polyethylene glycol powder (GLYCOLAX/MIRALAX) powder 2 (two) times daily.    . Potassium Chloride Crys CR (KLOR-CON M20 PO) Take 20 mEq by mouth daily.     [name] spironolactone (ALDACTONE) 25 MG tablet Take 25 mg by mouth daily.    . clotrimazole-betamethasone (LOTRISONE) cream Apply 1 application topically 2 (two) times daily. Apply to ear     No current facility-administered medications for this visit.    Allergies:   Prednisone and Codeine    Social History:  The patient  reports that she quit smoking about 19 years ago. Her smoking use included Cigarettes.  She started smoking about 55 years ago. She has a 48 pack-year smoking history. She has never used smokeless tobacco. She reports that she does not drink alcohol or use illicit drugs.   Family History:  The patient's family history includes Diabetes in her brother; Hypertension in an other family member; Migraines in her brother.  ROS: .   All other systems are reviewed and negative.Unless otherwise mentioned in H&P above.   PHYSICAL EXAM: VS:  BP 138/64 mmHg  Pulse 56  Ht 5\' 6"  (1.676 m)  Wt 168 lb (76.204 kg)  BMI 27.13 kg/m2  SpO2 96% , BMI Body mass index is 27.13 kg/(m^2). GEN: Well nourished, well developed, in no acute distress HEENT: normal Neck: no JVD, carotid bruits, or masses Cardiac: RRR; no murmurs, rubs, or gallops,no edema  Respiratory:  clear to auscultation bilaterally, normal work of breathing. Wearing oxygen 2 L. GI: soft, nontender, nondistended, + BS MS: no deformity or atrophy Skin: warm and dry, no rash, pale. Neuro:  Strength and sensation are intact Psych: euthymic mood, full affect   Recent Labs: 01/05/2015: ALT 9*; B Natriuretic Peptide 1473.0*; BUN 21*; Creatinine, Ser 0.99; Hemoglobin 9.6*; Platelets 165; Potassium 5.0; Sodium 142    Lipid Panel No results found for: CHOL, TRIG, HDL, CHOLHDL, VLDL, LDLCALC, LDLDIRECT    Wt Readings from Last 3 Encounters:  01/07/15 168 lb (76.204 kg)  12/18/14 168 lb (76.204 kg)  12/17/14 168 lb (76.204 kg)      Other studies Reviewed: Additional studies/ records that were reviewed today include: recent hospitalization.    ASSESSMENT AND PLAN:  1.  Chronic chest pain: I do not believe this to be cardiac in etiology.  She does have a history of CAD, but was nonobstructive per cath in 2008.  The patient describes appears more musculoskeletal.  I will not make any changes on her medication time certain what she is taking.  She is going to have her "friend" bring her medications by tomorrow for [place] to review.   Most recent echocardiogram completed in October 2015 demonstrated LVEF of 50% with grade 1 diastolic dysfunction.  2. Chronic diastolic CHF:I am not certain, diuretic.  She is taking.  She changes her story frequently autonomic any changes on her medication regimen time certain which doses she is actually taking.  I have reviewed her most recent lab work from the ER, dated 01/05/2015.  Her sodium was 142, potassium 3.8.  Creatinine 0.99.  I doubt she is overdosing.  Weight is stable.  3. Hypertension:blood pressure is currently well-controlled.  She states she is not taking metoprolol as directed.  Heart rate is bradycardic I will review her medications.  Once her friend brings them so that we can make changes if necessary.   Current medicines are reviewed at length with the patient today.    Labs/ tests ordered today include: noneNo orders of the defined types were placed in this encounter.     Disposition:   FU with 2 weeks to one month Signed, Joni Reining, NP  01/07/2015 3:50 PM     [facility] [id number] [place], [place] [address] Phone: [phone]; Fax: [phone]

## 2015-01-07 NOTE — Patient Instructions (Signed)
Your physician recommends that you schedule a follow-up appointment in: TBD  Please bring your medications in tomorrow to be reviewed.   THE DATE OF YOU CATARACTS SURGERY WAS FEBRUARY 9 TH OF 2015.   Thank you for choosing [facility]!

## 2015-01-13 ENCOUNTER — Ambulatory Visit: Payer: Medicare HMO | Admitting: Neurology

## 2015-01-17 ENCOUNTER — Encounter (HOSPITAL_COMMUNITY): Payer: Self-pay | Admitting: Emergency Medicine

## 2015-01-17 ENCOUNTER — Emergency Department (HOSPITAL_COMMUNITY): Payer: Medicare HMO

## 2015-01-17 ENCOUNTER — Emergency Department (HOSPITAL_COMMUNITY)
Admission: EM | Admit: 2015-01-17 | Discharge: 2015-01-17 | Disposition: A | Discharge status: Home / Self Care | Payer: Medicare HMO | Attending: Emergency Medicine | Admitting: Emergency Medicine

## 2015-01-17 DIAGNOSIS — M25551 Pain in right hip: Secondary | ICD-10-CM | POA: Diagnosis present

## 2015-01-17 DIAGNOSIS — Z79899 Other long term (current) drug therapy: Secondary | ICD-10-CM | POA: Insufficient documentation

## 2015-01-17 DIAGNOSIS — Z862 Personal history of diseases of the blood and blood-forming organs and certain disorders involving the immune mechanism: Secondary | ICD-10-CM | POA: Diagnosis not present

## 2015-01-17 DIAGNOSIS — E119 Type 2 diabetes mellitus without complications: Secondary | ICD-10-CM | POA: Diagnosis not present

## 2015-01-17 DIAGNOSIS — Z87891 Personal history of nicotine dependence: Secondary | ICD-10-CM | POA: Insufficient documentation

## 2015-01-17 DIAGNOSIS — E785 Hyperlipidemia, unspecified: Secondary | ICD-10-CM | POA: Diagnosis not present

## 2015-01-17 DIAGNOSIS — N182 Chronic kidney disease, stage 2 (mild): Secondary | ICD-10-CM | POA: Diagnosis not present

## 2015-01-17 DIAGNOSIS — K589 Irritable bowel syndrome without diarrhea: Secondary | ICD-10-CM | POA: Insufficient documentation

## 2015-01-17 DIAGNOSIS — F419 Anxiety disorder, unspecified: Secondary | ICD-10-CM | POA: Diagnosis not present

## 2015-01-17 DIAGNOSIS — M1990 Unspecified osteoarthritis, unspecified site: Secondary | ICD-10-CM | POA: Insufficient documentation

## 2015-01-17 DIAGNOSIS — I252 Old myocardial infarction: Secondary | ICD-10-CM | POA: Insufficient documentation

## 2015-01-17 DIAGNOSIS — H5462 Unqualified visual loss, left eye, normal vision right eye: Secondary | ICD-10-CM | POA: Insufficient documentation

## 2015-01-17 DIAGNOSIS — J449 Chronic obstructive pulmonary disease, unspecified: Secondary | ICD-10-CM | POA: Diagnosis not present

## 2015-01-17 DIAGNOSIS — I2510 Atherosclerotic heart disease of native coronary artery without angina pectoris: Secondary | ICD-10-CM | POA: Diagnosis not present

## 2015-01-17 DIAGNOSIS — Z7901 Long term (current) use of anticoagulants: Secondary | ICD-10-CM | POA: Diagnosis not present

## 2015-01-17 DIAGNOSIS — E039 Hypothyroidism, unspecified: Secondary | ICD-10-CM | POA: Insufficient documentation

## 2015-01-17 DIAGNOSIS — G8929 Other chronic pain: Secondary | ICD-10-CM | POA: Insufficient documentation

## 2015-01-17 DIAGNOSIS — Z87828 Personal history of other (healed) physical injury and trauma: Secondary | ICD-10-CM | POA: Insufficient documentation

## 2015-01-17 DIAGNOSIS — Z8701 Personal history of pneumonia (recurrent): Secondary | ICD-10-CM | POA: Diagnosis not present

## 2015-01-17 DIAGNOSIS — Z7982 Long term (current) use of aspirin: Secondary | ICD-10-CM | POA: Insufficient documentation

## 2015-01-17 DIAGNOSIS — M25559 Pain in unspecified hip: Secondary | ICD-10-CM

## 2015-01-17 DIAGNOSIS — K219 Gastro-esophageal reflux disease without esophagitis: Secondary | ICD-10-CM | POA: Diagnosis not present

## 2015-01-17 DIAGNOSIS — I129 Hypertensive chronic kidney disease with stage 1 through stage 4 chronic kidney disease, or unspecified chronic kidney disease: Secondary | ICD-10-CM | POA: Insufficient documentation

## 2015-01-17 LAB — TROPONIN I: Troponin I: 0.08 ng/mL — ABNORMAL HIGH (ref <0.031)

## 2015-01-17 LAB — CBC WITH DIFFERENTIAL/PLATELET
BASOS ABS: 0 10*3/uL (ref 0.0–0.1)
Basophils Relative: 0 % (ref 0–1)
Eosinophils Absolute: 0.1 10*3/uL (ref 0.0–0.7)
Eosinophils Relative: 1 % (ref 0–5)
HCT: 32.4 % — ABNORMAL LOW (ref 36.0–46.0)
HEMOGLOBIN: 10 g/dL — AB (ref 12.0–15.0)
LYMPHS ABS: 2.8 10*3/uL (ref 0.7–4.0)
Lymphocytes Relative: 27 % (ref 12–46)
MCH: 29.9 pg (ref 26.0–34.0)
MCHC: 30.9 g/dL (ref 30.0–36.0)
MCV: 96.7 fL (ref 78.0–100.0)
Monocytes Absolute: 0.9 10*3/uL (ref 0.1–1.0)
Monocytes Relative: 8 % (ref 3–12)
NEUTROS ABS: 6.5 10*3/uL (ref 1.7–7.7)
NEUTROS PCT: 64 % (ref 43–77)
Platelets: 262 10*3/uL (ref 150–400)
RBC: 3.35 MIL/uL — ABNORMAL LOW (ref 3.87–5.11)
RDW: 14.9 % (ref 11.5–15.5)
WBC: 10.3 10*3/uL (ref 4.0–10.5)

## 2015-01-17 LAB — BASIC METABOLIC PANEL
ANION GAP: 10 (ref 5–15)
BUN: 34 mg/dL — AB (ref 6–20)
CO2: 28 mmol/L (ref 22–32)
CREATININE: 1.42 mg/dL — AB (ref 0.44–1.00)
Calcium: 9.3 mg/dL (ref 8.9–10.3)
Chloride: 98 mmol/L — ABNORMAL LOW (ref 101–111)
GFR calc Af Amer: 43 mL/min — ABNORMAL LOW (ref >60)
GFR, EST NON AFRICAN AMERICAN: 37 mL/min — AB (ref >60)
Glucose, Bld: 113 mg/dL — ABNORMAL HIGH (ref 65–99)
Potassium: 5.2 mmol/L — ABNORMAL HIGH (ref 3.5–5.1)
SODIUM: 136 mmol/L (ref 135–145)

## 2015-01-17 MED ORDER — ALPRAZOLAM 0.5 MG PO TABS
1.0000 mg | ORAL_TABLET | Freq: Once | ORAL | Status: AC
  Administered 2015-01-17: 1 mg via ORAL
  Filled 2015-01-17: qty 2

## 2015-01-17 MED ORDER — MORPHINE SULFATE 4 MG/ML IJ SOLN
8.0000 mg | Freq: Once | INTRAMUSCULAR | Status: AC
  Administered 2015-01-17: 8 mg via INTRAMUSCULAR
  Filled 2015-01-17: qty 2

## 2015-01-17 MED ORDER — ONDANSETRON 4 MG PO TBDP
4.0000 mg | ORAL_TABLET | Freq: Once | ORAL | Status: AC
  Administered 2015-01-17: 4 mg via ORAL
  Filled 2015-01-17: qty 1

## 2015-01-17 NOTE — ED Provider Notes (Signed)
CSN: [id number]     Arrival date & time 01/17/15  1843 History   First MD Initiated Contact with Patient 01/17/15 1854     Chief Complaint  Patient presents with  . Withdrawal      HPI  Patient position evaluation essentially stating that she feels she is under medicated. Significant history of within the last week she was seen at [facility]. Intubated for respiratory failure. [facility] to [facility]. Had a non-ST elevation my O cardial infarction with troponin elevated at 3.42.  Is discharged with lower doses of her Xanax, and changed from MS Contin OxyContin as it was felt that her respiratory failure is multifactorial and possibly due to overmedication. She is scheduled to go to rehabilitation facility tomorrow. His charge plan was for her to stay at [facility] and be directly discharged in the morning. Patient wanted to go home". She was discharged on 0.25 Xanax 3 times a day rather than her usual 1 mg. An Akin from 59 a day of MS Contin, to 30 a day of OxyContin. She complains of feeling shaky and weak and worsening of her chronic right hip pain.  Past Medical History  Diagnosis Date  . Type 2 diabetes mellitus   . Arthritis   . COPD (chronic obstructive pulmonary disease)     Home oxygen  . Palpitations     Reports history of atrial fibrillation, although none seen recently  . Coronary atherosclerosis of native coronary artery     Nonobstructive at catheterization 2008  . GERD (gastroesophageal reflux disease)   . Irritable bowel syndrome   . Hypothyroidism   . Hyperlipidemia   . Chronic edema   . Pneumonia   . Essential hypertension   . Chronic pain   . Anemia of chronic disease   . Bulging lumbar disc     3  . Headache(784.0)   . Sprain of neck   . Asthma   . Visual loss, left eye   . CKD (chronic kidney disease), stage II   . Claustrophobia   . Temporal arteritis   . Memory difficulty   . Myocardial infarction    Past Surgical History   Procedure Laterality Date  . Cholecystectomy    . Appendectomy    . Foot surgery    . Abdominal hysterectomy    . Cataract extraction w/phaco Left 09/01/2013    Procedure: CATARACT EXTRACTION PHACO AND INTRAOCULAR LENS PLACEMENT (IOC);  Surgeon: Gemma Payor, MD;  Location: [facility];  Service: Ophthalmology;  Laterality: Left;  CDE:  14.13  . Artery biopsy Left 09/22/2013    Procedure: BIOPSY TEMPORAL ARTERY;  Surgeon: Mariella Saa, MD;  Location: [facility] OR;  Service: General;  Laterality: Left;   Family History  Problem Relation Age of Onset  . Hypertension    . Diabetes Brother   . Migraines Brother    History  Substance Use Topics  . Smoking status: Former Smoker -- 1.00 packs/day for 48 years    Types: Cigarettes    Start date: 05/26/1959    Quit date: 12/28/1995  . Smokeless tobacco: Never Used  . Alcohol Use: No   OB History    Gravida Para Term Preterm AB TAB SAB Ectopic Multiple Living   1 1 1             Review of Systems  Constitutional: Negative for fever, chills, diaphoresis, appetite change and fatigue.  HENT: Negative for mouth sores, sore throat and trouble swallowing.   Eyes:  Negative for visual disturbance.  Respiratory: Negative for cough, chest tightness, shortness of breath and wheezing.   Cardiovascular: Negative for chest pain.  Gastrointestinal: Negative for nausea, vomiting, abdominal pain, diarrhea and abdominal distention.  Endocrine: Negative for polydipsia, polyphagia and polyuria.  Genitourinary: Negative for dysuria, frequency and hematuria.  Musculoskeletal: Negative for gait problem.       Right hip pain. No reported injury.  Skin: Negative for color change, pallor and rash.  Neurological: Negative for dizziness, syncope, light-headedness and headaches.  Hematological: Does not bruise/bleed easily.  Psychiatric/Behavioral: Negative for behavioral problems and confusion. The patient is nervous/anxious.       Allergies  Prednisone and  Codeine  Home Medications   Prior to Admission medications   Medication Sig Start Date End Date Taking? Authorizing Provider  acetaminophen (TYLENOL) 500 MG tablet Take 1,000 mg by mouth every 4 (four) hours.    Yes Historical Provider, MD  ALPRAZolam (XANAX) 0.25 MG tablet Take 0.25 mg by mouth 3 (three) times daily.   Yes Historical Provider, MD  aspirin EC 81 MG tablet Take 81 mg by mouth daily.   Yes Historical Provider, MD  atorvastatin (LIPITOR) 80 MG tablet Take 80 mg by mouth daily.   Yes Historical Provider, MD  BELSOMRA 20 MG TABS TAKE ONE TABLET BY MOUTH AT BEDTIME AS NEEDED Patient taking differently: TAKE ONE TABLET BY MOUTH AT BEDTIME 12/02/14  Yes York Spaniel, MD  clopidogrel (PLAVIX) 75 MG tablet Take 75 mg by mouth daily.   Yes Historical Provider, MD  dexamethasone (DECADRON) 1 MG tablet Take 0.5-1 mg by mouth every other day. And half tablet on all other days.   Yes Historical Provider, MD  donepezil (ARICEPT) 10 MG tablet Take 1 tablet (10 mg total) by mouth at bedtime. 08/20/14  Yes York Spaniel, MD  enalapril (VASOTEC) 2.5 MG tablet Take 2.5 mg by mouth daily.   Yes Historical Provider, MD  fenofibrate 160 MG tablet Take 160 mg by mouth at bedtime.   Yes Historical Provider, MD  furosemide (LASIX) 20 MG tablet Take 20 mg by mouth daily.   Yes Historical Provider, MD  levothyroxine (SYNTHROID, LEVOTHROID) 100 MCG tablet Take 100 mcg by mouth daily.     Yes Historical Provider, MD  metoprolol tartrate (LOPRESSOR) 25 MG tablet Take 12.5 mg by mouth 2 (two) times daily.   Yes Historical Provider, MD  OxyCODONE (OXYCONTIN) 10 mg T12A 12 hr tablet Take 10 mg by mouth every 12 (twelve) hours.   Yes Historical Provider, MD  pantoprazole (PROTONIX) 40 MG tablet Take 40 mg by mouth 2 (two) times daily.   Yes Historical Provider, MD  Potassium Chloride Crys CR (KLOR-CON M20 PO) Take 20 mEq by mouth 2 (two) times daily.    Yes Historical Provider, MD  ALPRAZolam Prudy Feeler) 1 MG  tablet Take 1 mg by mouth 3 (three) times daily as needed for anxiety.     Historical Provider, MD  docusate sodium (COLACE) 100 MG capsule Take 2 capsules (200 mg total) by mouth daily. Patient taking differently: Take 200 mg by mouth daily as needed for mild constipation.  07/13/14   Malissa Hippo, MD  gabapentin (NEURONTIN) 100 MG capsule Take 2 capsules (200 mg total) by mouth 3 (three) times daily. 08/25/14   York Spaniel, MD  Linaclotide [facility]) 145 MCG CAPS capsule Take 2 capsules (290 mcg total) by mouth daily. 07/13/14   Malissa Hippo, MD  metoprolol succinate (TOPROL-XL) 100 MG  24 hr tablet Take 1 tablet (100 mg total) by mouth daily. Take with or immediately following a meal. 05/01/14   Jonelle Sidle, MD  nitroGLYCERIN (NITROSTAT) 0.4 MG SL tablet Place 0.4 mg under the tongue every 5 (five) minutes as needed for chest pain.     Historical Provider, MD   BP 111/47 mmHg  Pulse 67  Temp(Src) 98.8 F (37.1 C) (Oral)  Resp 11  Ht 5' 6.5" (1.689 m)  Wt 168 lb (76.204 kg)  BMI 26.71 kg/m2  SpO2 100% Physical Exam  Constitutional: She is oriented to person, place, and time. She appears well-developed and well-nourished. No distress.  HENT:  Head: Normocephalic.  Eyes: Conjunctivae are normal. Pupils are equal, round, and reactive to light. No scleral icterus.  Neck: Normal range of motion. Neck supple. No thyromegaly present.  Cardiovascular: Normal rate and regular rhythm.  Exam reveals no gallop and no friction rub.   No murmur heard. Pulmonary/Chest: Effort normal and breath sounds normal. No respiratory distress. She has no wheezes. She has no rales.  Abdominal: Soft. Bowel sounds are normal. She exhibits no distension. There is no tenderness. There is no rebound.  Musculoskeletal: Normal range of motion.       Right hip: She exhibits tenderness.  Tenderness over the right trochanteric bursa.  Neurological: She is alert and oriented to person, place, and time.  Skin:  Skin is warm and dry. No rash noted.  Psychiatric: Her behavior is normal. Her mood appears anxious. Her speech is rapid and/or pressured.    ED Course  Procedures (including critical care time) Labs Review Labs Reviewed  CBC WITH DIFFERENTIAL/PLATELET - Abnormal; Notable for the following:    RBC 3.35 (*)    Hemoglobin 10.0 (*)    HCT 32.4 (*)    All other components within normal limits  BASIC METABOLIC PANEL - Abnormal; Notable for the following:    Potassium 5.2 (*)    Chloride 98 (*)    Glucose, Bld 113 (*)    BUN 34 (*)    Creatinine, Ser 1.42 (*)    GFR calc non Af Amer 37 (*)    GFR calc Af Amer 43 (*)    All other components within normal limits  TROPONIN I - Abnormal; Notable for the following:    Troponin I 0.08 (*)    All other components within normal limits    Imaging Review Dg Hip Unilat With Pelvis 2-3 Views Right  01/17/2015   CLINICAL DATA:  Right hip pain, subacute right hip pain with no injury  EXAM: RIGHT HIP (WITH PELVIS) 2-3 VIEWS  COMPARISON:  None.  FINDINGS: Moderate bilateral hip arthritis. No fracture or dislocation. Pelvic bones intact. Inferior left pubic ramus not imaged.  IMPRESSION: No acute findings   Electronically Signed   By: Esperanza Heir M.D.   On: 01/17/2015 21:17     EKG Interpretation   Date/Time:  Sunday January 17 2015 19:42:39 EDT Ventricular Rate:  68 PR Interval:  161 QRS Duration: 88 QT Interval:  484 QTC Calculation: 515 R Axis:   62 Text Interpretation:  Sinus rhythm Abnormal T, probable ischemia, anterior  leads Prolonged QT interval Confirmed by Fayrene Fearing  MD, Ahlivia Salahuddin ([id number]) on  01/17/2015 7:47:50 PM      MDM   Final diagnoses:  Hip pain    Patient's EKG is unchanged versus her discharge EKG from [facility]. Her troponin had been elevated at [facility] to 3.42 and is now 0.08.  Her x-rays are hip show no acute abnormalities. Was given 1 by mouth Xanax. An 8 of IM morphine. Her symptoms are much improved she is much  more calm. Her exam of her hip is consistent with trochanteric bursitis. Plan is home. Continue current medications. Rehabilitation facility tomorrow as scheduled.    [name], MD 01/17/15 2154

## 2015-01-17 NOTE — Discharge Instructions (Signed)
Continue your medications at their current dosage.  Kidney with your plans to go to the rehabilitation facility tomorrow as coordinated by Deretha Emory at your discharge.

## 2015-01-17 NOTE — ED Notes (Signed)
Pt states she has not had any medications and that she left [facility] yesterday. She states that they were weaning her off her IV medications. She states she has not been able to sleep.

## 2015-01-17 NOTE — ED Notes (Signed)
Pt alert & oriented x4, stable gait. Patient given discharge instructions, paperwork & prescription(s). Patient  instructed to stop at the registration desk to finish any additional paperwork. Patient  verbalized understanding. Pt left department in wheelchair w/ no further questions. 

## 2015-01-17 NOTE — ED Notes (Signed)
Pt d/c from [facility] last night states she was on strong medications there and feels like she is going thru withdrawals. Pt reports nightmares, feeling shaky, nausea, and weak. Pt is suppose to go to [facility] for rehab starting tomorrow for a recent MI.

## 2015-01-19 ENCOUNTER — Ambulatory Visit (INDEPENDENT_AMBULATORY_CARE_PROVIDER_SITE_OTHER): Payer: Medicare HMO | Admitting: Internal Medicine

## 2015-01-21 ENCOUNTER — Ambulatory Visit: Payer: Medicare HMO | Admitting: Neurology

## 2015-01-26 ENCOUNTER — Telehealth (INDEPENDENT_AMBULATORY_CARE_PROVIDER_SITE_OTHER): Payer: Self-pay | Admitting: *Deleted

## 2015-01-26 ENCOUNTER — Ambulatory Visit (INDEPENDENT_AMBULATORY_CARE_PROVIDER_SITE_OTHER): Payer: Medicare HMO | Admitting: Adult Health

## 2015-01-26 ENCOUNTER — Encounter: Payer: Self-pay | Admitting: Adult Health

## 2015-01-26 VITALS — BP 118/68 | HR 74 | Ht 66.0 in | Wt 149.0 lb

## 2015-01-26 DIAGNOSIS — F1920 Other psychoactive substance dependence, uncomplicated: Secondary | ICD-10-CM | POA: Diagnosis not present

## 2015-01-26 DIAGNOSIS — I10 Essential (primary) hypertension: Secondary | ICD-10-CM

## 2015-01-26 DIAGNOSIS — I255 Ischemic cardiomyopathy: Secondary | ICD-10-CM | POA: Diagnosis not present

## 2015-01-26 DIAGNOSIS — F1120 Opioid dependence, uncomplicated: Secondary | ICD-10-CM

## 2015-01-26 NOTE — Progress Notes (Signed)
Cardiology Office Note   Date:  01/26/2015   ID:  Kara [name], DOB 04/25/1946, MRN [id number]  PCP:  Alice ReichertMCINNIS,ANGUS G, MD  Cardiologist:  [facility]/ Joni ReiningKathryn Pate Aylward, NP   Chief Complaint  Patient presents with  . Cardiomyopathy      History of Present Illness: Kara [name] is a 69 y.o. female who presents for ongoing assessment and management of NICM, palpitations, with hx of COPD, diabetes, and anxiety. She was last seen in the office after being in the ER for complaints of chest pain and ruled out for cardiac etiology. She was non-adherent to medical regimen and had multiple somatic complaints. She has [facility] in the interim and was for respiratory failure and [facility] for NSTEMI Troponin 3.86.Kara [name]. She was also treated for pneumonia.  She was discharged on lower doses of oxycontin and Xanax. She was seen in ER for narcotic withdrawal on 01/17/2015. She has been seen by PCP Dr. [name]. Kara [name].She was due to go to rehab facility.   She comes today with continued somatic complaints. Thinks she is on too much morphine. She Has episodes of tachy palpitations but they do not last very l ong, and are usually associated with anxiety. She wants help getting off of morphine. She refused IP Rehab.   Past Medical History  Diagnosis Date  . Type 2 diabetes mellitus   . Arthritis   . COPD (chronic obstructive pulmonary disease)     Home oxygen  . Palpitations     Reports history of atrial fibrillation, although none seen recently  . Coronary atherosclerosis of native coronary artery     Nonobstructive at catheterization 2008  . GERD (gastroesophageal reflux disease)   . Irritable bowel syndrome   . Hypothyroidism   . Hyperlipidemia   . Chronic edema   . Pneumonia   . Essential hypertension   . Chronic pain   . Anemia of chronic disease   . Bulging lumbar disc     3  . Headache(784.0)   . Sprain of neck   . Asthma   . Visual loss, left eye   . CKD  (chronic kidney disease), stage II   . Claustrophobia   . Temporal arteritis   . Memory difficulty   . Myocardial infarction     Past Surgical History  Procedure Laterality Date  . Cholecystectomy    . Appendectomy    . Foot surgery    . Abdominal hysterectomy    . Cataract extraction w/phaco Left 09/01/2013    Procedure: CATARACT EXTRACTION PHACO AND INTRAOCULAR LENS PLACEMENT (IOC);  Surgeon: Gemma PayorKerry Hunt, MD;  Location: [facility] ORS;  Service: Ophthalmology;  Laterality: Left;  CDE:  14.13  . Artery biopsy Left 09/22/2013    Procedure: BIOPSY TEMPORAL ARTERY;  Surgeon: Mariella SaaBenjamin T Hoxworth, MD;  Location: [facility] OR;  Service: General;  Laterality: Left;     Current Outpatient Prescriptions  Medication Sig Dispense Refill  . acetaminophen (TYLENOL) 500 MG tablet Take 1,000 mg by mouth every 4 (four) hours.     . ALPRAZolam (XANAX) 0.25 MG tablet Take 0.25 mg by mouth 3 (three) times daily.    Kara [name]. ALPRAZolam (XANAX) 1 MG tablet Take 1 mg by mouth 3 (three) times daily as needed for anxiety.     Kara [name]. aspirin EC 81 MG tablet Take 81 mg by mouth daily.    Kara [name]. atorvastatin (LIPITOR) 80 MG tablet Take 80 mg by mouth daily.    Kara [name]. BELSOMRA  20 MG TABS TAKE ONE TABLET BY MOUTH AT BEDTIME AS NEEDED (Patient taking differently: TAKE ONE TABLET BY MOUTH AT BEDTIME) 30 tablet 3  . clopidogrel (PLAVIX) 75 MG tablet Take 75 mg by mouth daily.    Kara [name] dexamethasone (DECADRON) 1 MG tablet Take 0.5-1 mg by mouth every other day. And half tablet on all other days.    Kara [name] docusate sodium (COLACE) 100 MG capsule Take 2 capsules (200 mg total) by mouth daily. (Patient taking differently: Take 200 mg by mouth daily as needed for mild constipation. ) 10 capsule 0  . donepezil (ARICEPT) 10 MG tablet Take 1 tablet (10 mg total) by mouth at bedtime. 30 tablet 5  . enalapril (VASOTEC) 2.5 MG tablet Take 2.5 mg by mouth daily.    . fenofibrate 160 MG tablet Take 160 mg by mouth at bedtime.    . furosemide (LASIX) 20 MG tablet Take 20 mg by  mouth daily.    Kara [name] gabapentin (NEURONTIN) 100 MG capsule Take 2 capsules (200 mg total) by mouth 3 (three) times daily. 180 capsule 3  . levothyroxine (SYNTHROID, LEVOTHROID) 100 MCG tablet Take 100 mcg by mouth daily.      . Linaclotide (LINZESS) 145 MCG CAPS capsule Take 2 capsules (290 mcg total) by mouth daily. 30 capsule 5  . metoprolol succinate (TOPROL-XL) 100 MG 24 hr tablet Take 1 tablet (100 mg total) by mouth daily. Take with or immediately following a meal. 90 tablet 3  . metoprolol tartrate (LOPRESSOR) 25 MG tablet Take 12.5 mg by mouth 2 (two) times daily.    . nitroGLYCERIN (NITROSTAT) 0.4 MG SL tablet Place 0.4 mg under the tongue every 5 (five) minutes as needed for chest pain.     . OxyCODONE (OXYCONTIN) 10 mg T12A 12 hr tablet Take 10 mg by mouth every 12 (twelve) hours.    . pantoprazole (PROTONIX) 40 MG tablet Take 40 mg by mouth 2 (two) times daily.    . Potassium Chloride Crys CR (KLOR-CON M20 PO) Take 20 mEq by mouth 2 (two) times daily.      No current facility-administered medications for this visit.    Allergies:   Prednisone and Codeine    Social History:  The patient  reports that she quit smoking about 19 years ago. Her smoking use included Cigarettes. She started smoking about 55 years ago. She has a 48 pack-year smoking history. She has never used smokeless tobacco. She reports that she does not drink alcohol or use illicit drugs.   Family History:  The patient's family history includes Diabetes in her brother; Hypertension in an other family member; Migraines in her brother.    ROS: .   All other systems are reviewed and negative.Unless otherwise mentioned in H&P above.   PHYSICAL EXAM: VS:  There were no vitals taken for this visit. , BMI There is no weight on file to calculate BMI. GEN: Well nourished, well developed, in no acute distress HEENT: normal Neck: no JVD, carotid bruits, or masses Cardiac: RRR; no murmurs, rubs, or gallops,no edema   Respiratory:  Bilateral wheezes, wearing oxygen via [place]  GI: soft, nontender, nondistended, + BS MS: no deformity or atrophyMultiple bruises on her arm and legs with healed lacerations.  Skin: warm and dry, no rash Neuro:  Strength and sensation are intact Psych: euthymic mood, full affect  Recent Labs: 01/05/2015: ALT 9*; B Natriuretic Peptide 1473.0* 01/17/2015: BUN 34*; Creatinine, Ser 1.42*; Hemoglobin 10.0*; Platelets 262; Potassium 5.2*; Sodium 136  Lipid Panel No results found for: CHOL, TRIG, HDL, CHOLHDL, VLDL, LDLCALC, LDLDIRECT    Wt Readings from Last 3 Encounters:  01/17/15 168 lb (76.204 kg)  01/07/15 168 lb (76.204 kg)  12/18/14 168 lb (76.204 kg)      Other studies Reviewed: 1. Echocardiogram  [facility] from Care Everywhere PROCEDURE  Study Quality: Technically difficult. A injection of Optison contrast agent   was performed to improve image quality. Contrast approved by: Dr. Kirby Crigler.   O2 Saturation is 94.  -  SUMMARY  There is mild concentric left ventricular hypertrophy.  The left ventricular size is normal.    Left ventricular systolic function is mildly reduced.  LV ejection fraction = 45-50%.  There is basal LV inferior wall hypokinesis  There is mid LV inferior wall hypokinesis  Unable to fully assess LV regional wall motion  The right ventricle is normal in size and function.  There is mild aortic regurgitation.  There is mild to moderate mitral regurgitation.  There is trivial pericardial effusion.  Mildly dilated ascending aorta.  There is no comparison study available.  -  FINDINGS:    LEFT VENTRICLE  There is mild concentric left ventricular hypertrophy. The left ventricular   size is normal. Left ventricular systolic function is mildly reduced. LV   ejection fraction = 45-50%. Left ventricular filling pattern is   indeterminate. There is basal LV inferior wall hypokinesis. There is mid LV    inferior wall hypokinesis. Unable to fully assess LV regional wall motion.  -    RIGHT VENTRICLE  The right ventricle is normal in size and function.    LEFT ATRIUM  The left atrium is borderline dilated.    RIGHT ATRIUM    Right atrium not well visualized.  -  AORTIC VALVE  There is aortic valve sclerosis. There is mild aortic regurgitation.  -  MITRAL VALVE  There is mild mitral annular calcification. There is mild to moderate mitral   regurgitation.  -  TRICUSPID VALVE  Structurally normal tricuspid valve. There is trace tricuspid regurgitation.   Adequate jet of tricuspid regurgitation was not detected, so RSVP (right   ventricular systolic pressure) could not be estimated.  -  PULMONIC VALVE  The pulmonic valve is not well visualized.  -  ARTERIES  The aortic root is normal size. Mildly dilated ascending aorta.  -  VENOUS  Pulmonary venous flow pattern not well visualized. IVC size was mildly   dilated.  -  EFFUSION  There is trivial pericardial effusion.  -  -  MMode/2D Measurements & Calculations  IVSd: 1.3 cm  LVIDd: 3.9 cm  LVPWd: 1.3 cm  LVIDs: 3.0 cm  LA dim: 3.6 cm  Ao root: 3.3 cm  EDV(MOD-sp4): 91.7 ml  ESV(MOD-sp4): 39.4 ml  LVOT diam: 2.0 cm  LVOT area: 3.1 cm2  SV(MOD-sp4): 52.3 ml  SI(MOD-sp4): 27.9 ml/m2  LA area A4: 20.7 cm2  LA length (vol): 5.6 cm  Doppler Measurements & Calculations  SV(LVOT): 31.0 ml  Ao max PG: 4.1 mmHg  Ao mean PG: 2.0 mmHg  Ao V2 VTI: 14.5 cm  AVA (VTI): 2.1 cm2  LV V1 VTI: 10.0 cm  AS Dimensionless Index (VTI): 0.69  AVAi(VTI) cm^2/m^2: 1.1 cm2  SV index(LVOT): 16.6 ml/m2     Reading Physician:  [place], [id number] 01/09/2015 01:32 PM   ASSESSMENT AND PLAN:  1.NSTEMI: Related to respiratory failure in the setting of pneumonia and COPD. No diagnostic cardiac cath was  completed on review of Care Everywhere archive. She was  treated with anbx and Oxygen support. Sent home on lower doses of Xanax and oxycodone. She had cardiac cath in 2008 that was non-obstructive. Most recent EF per [facility] 45%-50%. Will continue ASA, Plavix, metoprolol, and ACE. She will continue lasix.   2. Narcotic and Antianxiety medication addiction: She is asking for help on weaning from these medications but refuses IP rehab. I have advised to see PCP for this as we are not prescribing these medications. I have suggested that she cut back on her pain medications to one tab in am and 1.2 tb in pm. I have strongly encouraged her to see her PCP for assistance with this.  3.Hypertension: BP is well controlled. No changes in her medications at this time.    Current medicines are reviewed at length with the patient today.    Labs/ tests ordered today include: None No orders of the defined types were placed in this encounter.     Disposition:   FU with 56 months. Signed, Joni Reining, NP  01/26/2015 7:33 AM    [facility] [address], [place], [place] [address] Phone: [phone]; Fax: [phone]

## 2015-01-26 NOTE — Patient Instructions (Addendum)
Your physician wants you to follow-up in: 6 months with Dr. Diona BrownerMcDowell. You will receive a reminder letter in the mail two months in advance. If you don't receive a letter, please call our office to schedule the follow-up appointment.   Your physician has recommended you make the following change in your medication:   Please Decrease Morphine to 1 TABLET IN THE MORNING AND 1/2 TABLET IN EVENING  Please have Dr. [name] manage your Morphine   Thank you for choosing [facility]!

## 2015-01-26 NOTE — Telephone Encounter (Signed)
Requesting an appt with NUR --she recently had a MI and wants to schedule this appt.  If she doesn't answer phone leave on machine.

## 2015-01-26 NOTE — Progress Notes (Deleted)
Name: Lanice ShirtsCarolyn W Wiltsey    DOB: 11/06/1945  Age: 69 y.o.  MR#: [id number]       PCP:  Alice ReichertMCINNIS,ANGUS G, MD      Insurance: Payor: Monia [employer]ouch[employer] MEDICARE / Plan: [employer] MEDICARE HMO/PPO / Product Type: *No Product type* /   CC:    Chief Complaint  Patient presents with  . Cardiomyopathy    VS Filed Vitals:   01/26/15 1513  BP: 118/68  Pulse: 74  Height: 5\' 6"  (1.676 m)  Weight: 149 lb (67.586 kg)  SpO2: 99%    Weights Current Weight  01/26/15 149 lb (67.586 kg)  01/17/15 168 lb (76.204 kg)  01/07/15 168 lb (76.204 kg)    Blood Pressure  BP Readings from Last 3 Encounters:  01/26/15 118/68  01/17/15 125/44  01/07/15 138/64     Admit date:  (Not on file) Last encounter with RMR:  01/07/2015   Allergy Prednisone and Codeine  Current Outpatient Prescriptions  Medication Sig Dispense Refill  . acetaminophen (TYLENOL) 500 MG tablet Take 1,000 mg by mouth every 4 (four) hours.     . ALPRAZolam (XANAX) 0.25 MG tablet Take 0.25 mg by mouth 3 (three) times daily.    [name]. aspirin EC 81 MG tablet Take 81 mg by mouth daily.    [name]. atorvastatin (LIPITOR) 80 MG tablet Take 80 mg by mouth daily.    . clopidogrel (PLAVIX) 75 MG tablet Take 75 mg by mouth daily.    [name]. dexamethasone (DECADRON) 1 MG tablet Take 0.5-1 mg by mouth every other day. And half tablet on all other days.    [name]. docusate sodium (COLACE) 100 MG capsule Take 2 capsules (200 mg total) by mouth daily. (Patient taking differently: Take 200 mg by mouth daily as needed for mild constipation. ) 10 capsule 0  . donepezil (ARICEPT) 10 MG tablet Take 1 tablet (10 mg total) by mouth at bedtime. 30 tablet 5  . enalapril (VASOTEC) 2.5 MG tablet Take 2.5 mg by mouth daily.    . furosemide (LASIX) 20 MG tablet Take 20 mg by mouth. 2 tablets daily    . levothyroxine (SYNTHROID, LEVOTHROID) 100 MCG tablet Take 100 mcg by mouth daily.      . Linaclotide (LINZESS) 145 MCG CAPS capsule Take 2 capsules (290 mcg total) by mouth daily. 30 capsule 5  .  metFORMIN (GLUCOPHAGE) 1000 MG tablet Take 1,000 mg by mouth 2 (two) times daily with a meal.    . metoprolol tartrate (LOPRESSOR) 25 MG tablet Take 12.5 mg by mouth 2 (two) times daily.    [name]. morphine (MSIR) 30 MG tablet Take 30 mg by mouth 2 (two) times daily.    . nitroGLYCERIN (NITROSTAT) 0.4 MG SL tablet Place 0.4 mg under the tongue every 5 (five) minutes as needed for chest pain.     . pantoprazole (PROTONIX) 40 MG tablet Take 40 mg by mouth 2 (two) times daily.    . Potassium Chloride Crys CR (KLOR-CON M20 PO) Take 20 mEq by mouth 2 (two) times daily.      No current facility-administered medications for this visit.    Discontinued Meds:    Medications Discontinued During This Encounter  Medication Reason  . ALPRAZolam (XANAX) 1 MG tablet Error  . furosemide (LASIX) 20 MG tablet Error  . metoprolol succinate (TOPROL-XL) 100 MG 24 hr tablet Error  . BELSOMRA 20 MG TABS Error  . fenofibrate 160 MG tablet Error  . gabapentin (NEURONTIN) 100 MG capsule Error  .  OxyCODONE (OXYCONTIN) 10 mg T12A 12 hr tablet Error    Patient Active Problem List   Diagnosis Date Noted  . Memory difficulty 04/16/2014  . Temporal arteritis 10/14/2013  . 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 Systolic dysfunction 08/22/2013  . Headache 07/30/2013  . GERD (gastroesophageal reflux disease) 10/21/2012  . Renal insufficiency 08/07/2011  . Anemia of chronic disease 08/04/2011  . Chronic diastolic heart failure 07/10/2011  . Lower extremity edema 04/13/2011  . COPD (chronic obstructive pulmonary disease) 04/13/2011  . Essential hypertension, benign 04/13/2011  . Coronary atherosclerosis of native coronary artery 04/13/2011  . Hypothyroidism 04/13/2011  . Palpitations 04/13/2011    LABS    Component Value Date/Time   NA 136 01/17/2015 1954   NA 142 01/05/2015 0432   NA 143 12/17/2014 1103   NA 139 12/31/2013 1010   K 5.2* 01/17/2015 1954   K 5.0 01/05/2015 0432   K 4.5 12/17/2014 1103   CL 98*  01/17/2015 1954   CL 101 01/05/2015 0432   CL 104 12/17/2014 1103   CO2 28 01/17/2015 1954   CO2 34* 01/05/2015 0432   CO2 35* 12/17/2014 1103   GLUCOSE 113* 01/17/2015 1954   GLUCOSE 106* 01/05/2015 0432   GLUCOSE 113* 12/17/2014 1103   GLUCOSE 85 12/31/2013 1010   BUN 34* 01/17/2015 1954   BUN 21* 01/05/2015 0432   BUN 20 12/17/2014 1103   BUN 36* 12/31/2013 1010   CREATININE 1.42* 01/17/2015 1954   CREATININE 0.99 01/05/2015 0432   CREATININE 0.93 12/17/2014 1103   CREATININE 1.21* 07/14/2014 2019   CALCIUM 9.3 01/17/2015 1954   CALCIUM 8.9 01/05/2015 0432   CALCIUM 9.0 12/17/2014 1103   GFRNONAA 37* 01/17/2015 1954   GFRNONAA 57* 01/05/2015 0432   GFRNONAA 45* 07/14/2014 2019   GFRAA 43* 01/17/2015 1954   GFRAA >60 01/05/2015 0432   GFRAA 52* 07/14/2014 2019   CMP     Component Value Date/Time   NA 136 01/17/2015 1954   NA 139 12/31/2013 1010   K 5.2* 01/17/2015 1954   CL 98* 01/17/2015 1954   CO2 28 01/17/2015 1954   GLUCOSE 113* 01/17/2015 1954   GLUCOSE 85 12/31/2013 1010   BUN 34* 01/17/2015 1954   BUN 36* 12/31/2013 1010   CREATININE 1.42* 01/17/2015 1954   CREATININE 0.93 12/17/2014 1103   CALCIUM 9.3 01/17/2015 1954   PROT 6.1* 01/05/2015 0432   PROT 5.5* 12/31/2013 1010   ALBUMIN 3.5 01/05/2015 0432   AST 17 01/05/2015 0432   ALT 9* 01/05/2015 0432   ALKPHOS 45 01/05/2015 0432   BILITOT 0.5 01/05/2015 0432   GFRNONAA 37* 01/17/2015 1954   GFRAA 43* 01/17/2015 1954       Component Value Date/Time   WBC 10.3 01/17/2015 1954   WBC 4.8 01/05/2015 0432   WBC 4.1 12/17/2014 1103   WBC 9.9 12/31/2013 1010   HGB 10.0* 01/17/2015 1954   HGB 9.6* 01/05/2015 0432   HGB 9.7* 12/23/2014 1345   HCT 32.4* 01/17/2015 1954   HCT 32.1* 01/05/2015 0432   HCT 31.4* 12/23/2014 1345   MCV 96.7 01/17/2015 1954   MCV 100.3* 01/05/2015 0432   MCV 93.4 12/17/2014 1103    Lipid Panel  No results found for: CHOL, TRIG, HDL, CHOLHDL, VLDL, LDLCALC,  LDLDIRECT  ABG    Component Value Date/Time   PHART 7.385 08/24/2012 1400   PCO2ART 62.5* 08/24/2012 1400   PO2ART 84.5 08/24/2012 1400   HCO3 36.6* 08/24/2012 1400  TCO2 34.9 08/24/2012 1400   O2SAT 98.9 08/24/2012 1400     Lab Results  Component Value Date   TSH 1.249 08/19/2013   BNP (last 3 results)  Recent Labs  01/05/15 0432  BNP 1473.0*    ProBNP (last 3 results) No results for input(s): PROBNP in the last 8760 hours.  Cardiac Panel (last 3 results) No results for input(s): CKTOTAL, CKMB, TROPONINI, RELINDX in the last 72 hours.  Iron/TIBC/Ferritin/ %Sat    Component Value Date/Time   IRON 70 12/27/2012 1449   TIBC 386 12/27/2012 1449   FERRITIN 465* 06/09/2013 1425   IRONPCTSAT 18* 12/27/2012 1449     EKG Orders placed or performed during the hospital encounter of 01/17/15  . ED EKG  . ED EKG  . EKG 12-Lead  . EKG 12-Lead  . EKG     Prior Assessment and Plan Problem List as of 01/26/2015      Cardiovascular and Mediastinum   Temporal arteritis   Essential hypertension, benign   Last Assessment & Plan 05/01/2014 Office Visit Written 05/01/2014 11:53 AM by Jonelle Sidle, MD    Medication medications now, might consider an ACE inhibitor or ARB after review of her echocardiogram.      Coronary atherosclerosis of native coronary artery   Last Assessment & Plan 08/07/2014 Office Visit Written 08/07/2014  3:11 PM by Jodelle Gross, NP    I was not completed any new cardiac testing.  Can potentially repeat echocardiogram on followup visit.  His other comorbidities.  I do not know that she would be a candidate for interventional therapy at this time.      Chronic diastolic heart failure   Last Assessment & Plan 10/20/2013 Office Visit Written 10/20/2013  3:18 PM by Jodelle Gross, NP    No evidence of fluid retention on this assessment today. Continue current regimen.        Respiratory   COPD (chronic obstructive pulmonary disease)   Last  Assessment & Plan 07/10/2011 Office Visit Written 07/10/2011  3:46 PM by Jonelle Sidle, MD    Oxygen requiring, also likely impacts her feelings of shortness of breath.        Digestive   GERD (gastroesophageal reflux disease)     Endocrine   Hypothyroidism   Last Assessment & Plan 04/13/2011 Office Visit Written 04/13/2011  4:15 PM by Jonelle Sidle, MD    Need to followup TSH.        Genitourinary   Renal insufficiency   Last Assessment & Plan 05/01/2014 Office Visit Written 05/01/2014 11:53 AM by Jonelle Sidle, MD    Creatinine improved to 0.9 by recent testing.        Other   Headache   Visual loss, left eye   Memory difficulty   Lower extremity edema   Last Assessment & Plan 07/10/2011 Office Visit Written 07/10/2011  3:47 PM by Jonelle Sidle, MD    Much improved.      Palpitations   Last Assessment & Plan 08/07/2014 Office Visit Written 08/07/2014  3:10 PM by Jodelle Gross, NP    She states palpitations arebecoming worse.  After going over her medications.  It is noted that she is on by mouth steroids as well as inhalers, which can be contributing to symptoms.  I have also discussed with her her caffeine intake.  She is drinking 2-3 cans of diet [facility] a day.  I have discussed with her the need to  go caffeine free, but to do this slowly, as she has gotten use to high amounts of caffeine during the day.  That in conjunction with steroids can cause a ventricular irritability. I answered multiple questions about this with the patient.  She is willing to begin to cutdown.      Anemia of chronic disease   Systolic dysfunction   Last Assessment & Plan 10/20/2013 Office Visit Written 10/20/2013  3:16 PM by Jodelle Gross, NP    EF of 40% on last evaluation in January of 2015. Will have follow up appt with Dr Diona Browner and decision will be made to repeat with higher dose of BB, in 6 months.          Imaging: Dg Chest Port 1 View  01/05/2015   CLINICAL  DATA:  Central chest pain since yesterday around 1600 hours.  EXAM: PORTABLE CHEST - 1 VIEW  COMPARISON:  10/29/2014  FINDINGS: Cardiac enlargement. No vascular congestion. Probable emphysematous changes in the upper lungs. No focal airspace disease or consolidation. No blunting of costophrenic angles. No pneumothorax. Tortuous aorta. No change since previous study.  IMPRESSION: Cardiac enlargement. Emphysematous changes. No evidence of active pulmonary disease.   Electronically Signed   By: Burman Nieves M.D.   On: 01/05/2015 04:57   Dg Hip Unilat With Pelvis 2-3 Views Right  01/17/2015   CLINICAL DATA:  Right hip pain, subacute right hip pain with no injury  EXAM: RIGHT HIP (WITH PELVIS) 2-3 VIEWS  COMPARISON:  None.  FINDINGS: Moderate bilateral hip arthritis. No fracture or dislocation. Pelvic bones intact. Inferior left pubic ramus not imaged.  IMPRESSION: No acute findings   Electronically Signed   By: Esperanza Heir M.D.   On: 01/17/2015 21:17

## 2015-01-27 ENCOUNTER — Telehealth: Payer: Self-pay | Admitting: Neurology

## 2015-01-27 NOTE — Telephone Encounter (Signed)
I called the patient. No answer and unable to leave voicemail.

## 2015-01-27 NOTE — Telephone Encounter (Signed)
Apt has been scheduled for 02/09/15 with Dorene Arerri Setzer, NP and a 4 month f/u with Dr. Karilyn Cotaehman on 06/08/15.

## 2015-01-27 NOTE — Telephone Encounter (Signed)
Patient called and would like to know if she can have her sleep medication increased because she is having trouble sleeping. Please call and advise.

## 2015-01-28 NOTE — Telephone Encounter (Signed)
I called patient. She [facility] missed a revisit [facility]. The patient will need another revisit through this office. She reports some problems with insomnia. She has taken Bell's summer at the 20 mg dose which initially seemed to help, but no longer is effective. I will have her add alprazolam 0.25 mg tablets, 2 tablets at night with the Belsomra. The patient will follow-up in the next several weeks through our office.

## 2015-01-28 NOTE — Telephone Encounter (Signed)
I called the patient. She is having a hard time sleeping. She states she takes Belsomra 20 mg nightly (this is not showing up on her medication list-looks like it was discontinued, maybe on accident). She would like to know if she can take more or try a different sleep aid?

## 2015-01-28 NOTE — Telephone Encounter (Signed)
Appointment scheduled 7/27.

## 2015-02-05 ENCOUNTER — Other Ambulatory Visit: Payer: Self-pay | Admitting: Adult Health

## 2015-02-05 MED ORDER — ENALAPRIL MALEATE 2.5 MG PO TABS: 2.5000 mg | ORAL_TABLET | Freq: Every day | ORAL | Status: DC

## 2015-02-05 MED ORDER — ATORVASTATIN CALCIUM 80 MG PO TABS: 80.0000 mg | ORAL_TABLET | Freq: Every day | ORAL | Status: AC

## 2015-02-05 MED ORDER — METOPROLOL SUCCINATE ER 100 MG PO TB24: 100.0000 mg | ORAL_TABLET | Freq: Every day | ORAL | Status: DC

## 2015-02-05 MED ORDER — CLOPIDOGREL BISULFATE 75 MG PO TABS: 75.0000 mg | ORAL_TABLET | Freq: Every day | ORAL | Status: AC

## 2015-02-05 MED ORDER — NITROGLYCERIN 0.4 MG SL SUBL: 0.4000 mg | SUBLINGUAL_TABLET | SUBLINGUAL | Status: AC | PRN

## 2015-02-05 NOTE — Telephone Encounter (Signed)
[facility] requested refills

## 2015-02-05 NOTE — Telephone Encounter (Signed)
Please call pharmacy regarding instructions on patient's medications / tg

## 2015-02-09 ENCOUNTER — Telehealth: Payer: Self-pay | Admitting: Neurology

## 2015-02-09 ENCOUNTER — Ambulatory Visit (INDEPENDENT_AMBULATORY_CARE_PROVIDER_SITE_OTHER): Payer: Medicare HMO | Admitting: Internal Medicine

## 2015-02-09 MED ORDER — TRAZODONE HCL 50 MG PO TABS: 50.0000 mg | ORAL_TABLET | Freq: Every day | ORAL | Status: DC

## 2015-02-09 NOTE — Telephone Encounter (Addendum)
Patient is again calling about her Rx Belsomra 20 mg.  She states she is having trouble sleeping. She sleeps for only 2 to 3 hrs per night. Please call @ [phone] or [phone].

## 2015-02-09 NOTE — Telephone Encounter (Signed)
I called patient. She apparently has not been sleeping well. We will stop the Belsomra, I will give her prescription for trazodone. She is on Aricept at night, sometimes this may result in insomnia issues.

## 2015-02-09 NOTE — Telephone Encounter (Signed)
I called the patient. She is still not sleeping well. She states that she is taking 20 mg of Belsomra and 1 mg of Xanax daily at bedtime. Per [name]' note (7/7), she was to take 0.5 mg of xanax. I tried to clarify this with her and she stated that his instructions were to take 1 mg of Xanax and her tablets are 1 mg tablets. She would like something else to help her sleep.

## 2015-02-17 ENCOUNTER — Ambulatory Visit (INDEPENDENT_AMBULATORY_CARE_PROVIDER_SITE_OTHER): Payer: Medicare HMO | Admitting: Neurology

## 2015-02-17 ENCOUNTER — Encounter: Payer: Self-pay | Admitting: Neurology

## 2015-02-17 VITALS — BP 103/49 | HR 62 | Ht 66.0 in | Wt 133.2 lb

## 2015-02-17 DIAGNOSIS — H5462 Unqualified visual loss, left eye, normal vision right eye: Secondary | ICD-10-CM | POA: Diagnosis not present

## 2015-02-17 DIAGNOSIS — G441 Vascular headache, not elsewhere classified: Secondary | ICD-10-CM

## 2015-02-17 DIAGNOSIS — R413 Other amnesia: Secondary | ICD-10-CM

## 2015-02-17 DIAGNOSIS — M316 Other giant cell arteritis: Secondary | ICD-10-CM | POA: Diagnosis not present

## 2015-02-17 DIAGNOSIS — F5104 Psychophysiologic insomnia: Secondary | ICD-10-CM

## 2015-02-17 DIAGNOSIS — G4700 Insomnia, unspecified: Secondary | ICD-10-CM | POA: Diagnosis not present

## 2015-02-17 MED ORDER — DONEPEZIL HCL 5 MG PO TABS: 5.0000 mg | ORAL_TABLET | Freq: Every day | ORAL | Status: AC

## 2015-02-17 MED ORDER — TRAZODONE HCL 100 MG PO TABS: 100.0000 mg | ORAL_TABLET | Freq: Every day | ORAL | Status: DC

## 2015-02-17 MED ORDER — DEXAMETHASONE 0.5 MG PO TABS: 0.5000 mg | ORAL_TABLET | Freq: Every day | ORAL | Status: AC

## 2015-02-17 NOTE — Progress Notes (Signed)
Reason for visit: Temporal arteritis  Kara [name] is an 69 y.o. 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 The patient has been on a gradually tapering Decadron dose, currently at 0.5 mg daily. She is tolerating the dose better, she continues to have chronic issues with insomnia. She takes alprazolam 1 mg 3 times daily, she is on trazodone taking 50 mg at night without much benefit. The patient has been on Ambien, and she was given a prescription for Belsomra, but she could not afford the medication. The patient also complains of ongoing daily headaches. The patient has had a myocardial infarction, with a workup done at [facility] since last seen. She has recovered from this. She remains on chronic daily oxygen therapy. She has ongoing issues with memory, she apparently was taken off of the Aricept during the hospitalization for the myocardial infarction, possibly due to bradycardia. She returns for further evaluation.  Past Medical History  Diagnosis Date  . Type 2 diabetes mellitus   . Arthritis   . COPD (chronic obstructive pulmonary disease)     Home oxygen  . Palpitations     Reports history of atrial fibrillation, although none seen recently  . Coronary atherosclerosis of native coronary artery     Nonobstructive at catheterization 2008  . GERD (gastroesophageal reflux disease)   . Irritable bowel syndrome   . Hypothyroidism   . Hyperlipidemia   . Chronic edema   . Pneumonia   . Essential hypertension   . Chronic pain   . Anemia of chronic disease   . Bulging lumbar disc     3  . Headache(784.0)   . Sprain of neck   . Asthma   . Visual loss, left eye   . CKD (chronic kidney disease), stage II   . Claustrophobia   . Temporal arteritis   . Memory difficulty   . Myocardial infarction   . Chronic insomnia 02/17/2015    Past Surgical History    Procedure Laterality Date  . Cholecystectomy    . Appendectomy    . Foot surgery    . Abdominal hysterectomy    . Cataract extraction w/phaco Left 09/01/2013    Procedure: CATARACT EXTRACTION PHACO AND INTRAOCULAR LENS PLACEMENT (IOC);  Surgeon: Gemma Payor, MD;  Location: [facility];  Service: Ophthalmology;  Laterality: Left;  CDE:  14.13  . Artery biopsy Left 09/22/2013    Procedure: BIOPSY TEMPORAL ARTERY;  Surgeon: Mariella Saa, MD;  Location: [facility] OR;  Service: General;  Laterality: Left;    Family History  Problem Relation Age of Onset  . Hypertension    . Diabetes Brother   . Migraines Brother     Social history:  reports that she quit smoking about 19 years ago. Her smoking use included Cigarettes. She started smoking about 55 years ago. She has a 48 pack-year smoking history. She has never used smokeless tobacco. She reports that she does not drink alcohol or use illicit drugs.    Allergies  Allergen Reactions  . Prednisone Other (See Comments)    Can have in small doses  . Codeine Nausea And Vomiting and Rash    Medications:  Prior to Admission medications   Medication Sig Start Date End Date Taking? Authorizing Provider  acetaminophen (TYLENOL) 500 MG tablet Take 1,000 mg by mouth every 4 (four) hours.    Yes  Historical Provider, MD  ALPRAZolam Prudy Feeler) 1 MG tablet Take 1 mg by mouth 3 (three) times daily as needed for anxiety.   Yes Historical Provider, MD  aspirin EC 81 MG tablet Take 81 mg by mouth daily.   Yes Historical Provider, MD  atorvastatin (LIPITOR) 80 MG tablet Take 1 tablet (80 mg total) by mouth daily. 02/05/15  Yes Jodelle Gross, NP  clopidogrel (PLAVIX) 75 MG tablet Take 1 tablet (75 mg total) by mouth daily. 02/05/15  Yes Jodelle Gross, NP  dexamethasone (DECADRON) 1 MG tablet Take 0.5 mg by mouth daily. And half tablet on all other days.   Yes Historical Provider, MD  docusate sodium (COLACE) 100 MG capsule Take 2 capsules (200 mg total) by  mouth daily. Patient taking differently: Take 200 mg by mouth daily as needed for mild constipation.  07/13/14  Yes Malissa Hippo, MD  enalapril (VASOTEC) 2.5 MG tablet Take 1 tablet (2.5 mg total) by mouth daily. 02/05/15  Yes Jodelle Gross, NP  furosemide (LASIX) 20 MG tablet Take 20 mg by mouth. 2 tablets daily   Yes Historical Provider, MD  levothyroxine (SYNTHROID, LEVOTHROID) 100 MCG tablet Take 100 mcg by mouth daily.     Yes Historical Provider, MD  metFORMIN (GLUCOPHAGE) 1000 MG tablet Take 1,000 mg by mouth 2 (two) times daily with a meal.   Yes Historical Provider, MD  metoprolol succinate (TOPROL-XL) 100 MG 24 hr tablet Take 1 tablet (100 mg total) by mouth daily. Take with or immediately following a meal. 02/05/15  Yes Jodelle Gross, NP  metoprolol tartrate (LOPRESSOR) 25 MG tablet Take 12.5 mg by mouth daily.   Yes Historical Provider, MD  morphine (MSIR) 30 MG tablet Take 30 mg by mouth 2 (two) times daily. 01/26/15 TAKE 30 MG IN THE MORNING AND TAKE 15 MG (1/2 TABLET) IN THE EVENING. DR. Renard Matter to manage    Yes Historical Provider, MD  nitroGLYCERIN (NITROSTAT) 0.4 MG SL tablet Place 1 tablet (0.4 mg total) under the tongue every 5 (five) minutes as needed for chest pain. 02/05/15  Yes Jodelle Gross, NP  ondansetron (ZOFRAN) 4 MG tablet Take 4 mg by mouth every 6 (six) hours as needed for nausea or vomiting.   Yes Historical Provider, MD  pantoprazole (PROTONIX) 40 MG tablet Take 40 mg by mouth 2 (two) times daily.   Yes Historical Provider, MD  Potassium Chloride Crys CR (KLOR-CON M20 PO) Take 20 mEq by mouth 2 (two) times daily.    Yes Historical Provider, MD  traZODone (DESYREL) 50 MG tablet Take 1 tablet (50 mg total) by mouth at bedtime. 02/09/15  Yes York Spaniel, MD  donepezil (ARICEPT) 10 MG tablet Take 1 tablet (10 mg total) by mouth at bedtime. Patient not taking: Reported on 02/17/2015 08/20/14   York Spaniel, MD    ROS:  Out of a complete 14 system  review of symptoms, the patient complains only of the following symptoms, and all other reviewed systems are negative.  Insomnia Memory problems Headache  Blood pressure 103/49, pulse 62, height 5\' 6"  (1.676 m), weight 133 lb 3.2 oz (60.419 kg).  Physical Exam  General: The patient is alert and cooperative at the time of the examination.  Skin: 2+ edema below the knees is noted bilaterally.   Neurologic Exam  Mental status: The patient is alert and oriented x 3 at the time of the examination. The patient has apparent normal recent and remote memory,  with an apparently normal attention span and concentration ability. The Mini-Mental Status Examination done today reveals a total score 28/30. The patient is able to name 7 animals in 30 seconds.   Cranial nerves: Facial symmetry is present. Speech is normal, no aphasia or dysarthria is noted. Extraocular movements are full. Visual fields are full. The patient is blind in the left eye, no light perception. Pupils are round, reactive on the right, discs are flat bilaterally.  Motor: The patient has good strength in all 4 extremities.  Sensory examination: Soft touch sensation is symmetric on the face, arms, and legs.  Coordination: The patient has good finger-nose-finger and heel-to-shin bilaterally.  Gait and station: The patient has a slightly wide-based gait. Tandem gait was not attempted. Romberg is negative.  Reflexes: Deep tendon reflexes are symmetric.   Assessment/Plan:  1. Temporal arthritis, blindness left eye  2. Chronic insomnia  3. Memory disorder  4. Chronic daily headache   The patient is mainly concerned with her insomnia today. The patient will be increased on the trazodone taking 100 mg at night. She will be sent for blood work to include a sedimentation rate, if this is unremarkable, the Decadron will continue to be tapered. A prescription for the 0.5 mg tablets was given. The patient will be placed back on  low-dose Aricept taking 5 mg at night. She will return in 4 months.  Marlan Palau MD 02/18/2015 7:57 AM  [facility] [address] [place], [place] [address]  Phone [phone] Fax [phone]

## 2015-02-17 NOTE — Patient Instructions (Signed)
Today, we will check blood work for the sedimentation rate, we will decide what to do about the Decadron dosing, I'll call you. We will get you back on the donepezil aching 5 mg at night for the memory. We will take trazodone, 100 mg at night, if this does not help you sleep, please call our office, we will go up on the dose. We will see you back in about 4 months.  Insomnia Insomnia is frequent trouble falling and/or staying asleep. Insomnia can be a long term problem or a short term problem. Both are common. Insomnia can be a short term problem when the wakefulness is related to a certain stress or worry. Long term insomnia is often related to ongoing stress during waking hours and/or poor sleeping habits. Overtime, sleep deprivation itself can make the problem worse. Every little thing feels more severe because you are overtired and your ability to cope is decreased. CAUSES   Stress, anxiety, and depression.  Poor sleeping habits.  Distractions such as TV in the bedroom.  Naps close to bedtime.  Engaging in emotionally charged conversations before bed.  Technical reading before sleep.  Alcohol and other sedatives. They may make the problem worse. They can hurt normal sleep patterns and normal dream activity.  Stimulants such as caffeine for several hours prior to bedtime.  Pain syndromes and shortness of breath can cause insomnia.  Exercise late at night.  Changing time zones may cause sleeping problems (jet lag). It is sometimes helpful to have someone observe your sleeping patterns. They should look for periods of not breathing during the night (sleep apnea). They should also look to see how long those periods last. If you live alone or observers are uncertain, you can also be observed at a sleep clinic where your sleep patterns will be professionally monitored. Sleep apnea requires a checkup and treatment. Give your caregivers your medical history. Give your caregivers  observations your family has made about your sleep.  SYMPTOMS   Not feeling rested in the morning.  Anxiety and restlessness at bedtime.  Difficulty falling and staying asleep. TREATMENT   Your caregiver may prescribe treatment for an underlying medical disorders. Your caregiver can give advice or help if you are using alcohol or other drugs for self-medication. Treatment of underlying problems will usually eliminate insomnia problems.  Medications can be prescribed for short time use. They are generally not recommended for lengthy use.  Over-the-counter sleep medicines are not recommended for lengthy use. They can be habit forming.  You can promote easier sleeping by making lifestyle changes such as:  Using relaxation techniques that help with breathing and reduce muscle tension.  Exercising earlier in the day.  Changing your diet and the time of your last meal. No night time snacks.  Establish a regular time to go to bed.  Counseling can help with stressful problems and worry.  Soothing music and white noise may be helpful if there are background noises you cannot remove.  Stop tedious detailed work at least one hour before bedtime. HOME CARE INSTRUCTIONS   Keep a diary. Inform your caregiver about your progress. This includes any medication side effects. See your caregiver regularly. Take note of:  Times when you are asleep.  Times when you are awake during the night.  The quality of your sleep.  How you feel the next day. This information will help your caregiver care for you.  Get out of bed if you are still awake after 15 minutes.  Read or do some quiet activity. Keep the lights down. Wait until you feel sleepy and go back to bed.  Keep regular sleeping and waking hours. Avoid naps.  Exercise regularly.  Avoid distractions at bedtime. Distractions include watching television or engaging in any intense or detailed activity like attempting to balance the household  checkbook.  Develop a bedtime ritual. Keep a familiar routine of bathing, brushing your teeth, climbing into bed at the same time each night, listening to soothing music. Routines increase the success of falling to sleep faster.  Use relaxation techniques. This can be using breathing and muscle tension release routines. It can also include visualizing peaceful scenes. You can also help control troubling or intruding thoughts by keeping your mind occupied with boring or repetitive thoughts like the old concept of counting sheep. You can make it more creative like imagining planting one beautiful flower after another in your backyard garden.  During your day, work to eliminate stress. When this is not possible use some of the previous suggestions to help reduce the anxiety that accompanies stressful situations. MAKE SURE YOU:   Understand these instructions.  Will watch your condition.  Will get help right away if you are not doing well or get worse. Document Released: 07/07/2000 Document Revised: 10/02/2011 Document Reviewed: 08/07/2007 [facility] Patient Information 2015 [place], [place]. This information is not intended to replace advice given to you by your health care provider. Make sure you discuss any questions you have with your health care provider.

## 2015-02-18 ENCOUNTER — Telehealth: Payer: Self-pay | Admitting: Adult Health

## 2015-02-18 ENCOUNTER — Telehealth: Payer: Self-pay | Admitting: Neurology

## 2015-02-18 LAB — SEDIMENTATION RATE: Sed Rate: 9 mm/hr (ref 0–40)

## 2015-02-18 NOTE — Telephone Encounter (Signed)
Pt called stating she has severe leg/ankle swelling & tightness. She did not weigh herself, but claims she is very uncomfortable. She explained she did not want any advice from the  NP, but from [facility] doctor. She has an appointment with her PCP tomorrow, but wanted to know if there was anything she could do today to help with her comfort.

## 2015-02-18 NOTE — Telephone Encounter (Signed)
Tammy @ [facility] is calling because she has questions about Rx's traZODone (DESYREL) 100 MG tablet and donepezil (ARICEPT) 5 MG tablet that were sent in yesterday for the patient. Please call and discuss. Thank you.

## 2015-02-18 NOTE — Telephone Encounter (Signed)
I called patient. Sedimentation rate was 9. We will cut back on the Decadron taking 0.5 mg every other day.

## 2015-02-18 NOTE — Telephone Encounter (Signed)
I called back and spoke with Tammy.  She wanted to verify the current med doses.  I verified info, and they will proceed with meds as prescribed.  She will call us back if anything further is needed.

## 2015-02-18 NOTE — Telephone Encounter (Signed)
Pt states that she is retaining fluid

## 2015-02-19 NOTE — Telephone Encounter (Signed)
Reviewed. This was forwarded to me yesterday, I am out of the office but happened to see it late Friday afternoon. Impossible to manage over the phone. Based on note, she presumably saw Dr. Renard Matter today for follow-up. I would have recommended increasing Lasix. Most importantly, she would have needed clinical assessment to see whether she required further medication adjustments versus hospitalization.

## 2015-02-22 ENCOUNTER — Encounter (HOSPITAL_COMMUNITY): Payer: Self-pay

## 2015-02-22 ENCOUNTER — Emergency Department (HOSPITAL_COMMUNITY)
Admission: EM | Admit: 2015-02-22 | Discharge: 2015-02-22 | Disposition: A | Discharge status: Home / Self Care | Payer: Medicare HMO | Attending: Emergency Medicine | Admitting: Emergency Medicine

## 2015-02-22 ENCOUNTER — Emergency Department (HOSPITAL_COMMUNITY): Payer: Medicare HMO

## 2015-02-22 DIAGNOSIS — Z7902 Long term (current) use of antithrombotics/antiplatelets: Secondary | ICD-10-CM | POA: Insufficient documentation

## 2015-02-22 DIAGNOSIS — Z7901 Long term (current) use of anticoagulants: Secondary | ICD-10-CM | POA: Diagnosis not present

## 2015-02-22 DIAGNOSIS — I252 Old myocardial infarction: Secondary | ICD-10-CM | POA: Insufficient documentation

## 2015-02-22 DIAGNOSIS — I129 Hypertensive chronic kidney disease with stage 1 through stage 4 chronic kidney disease, or unspecified chronic kidney disease: Secondary | ICD-10-CM | POA: Insufficient documentation

## 2015-02-22 DIAGNOSIS — E785 Hyperlipidemia, unspecified: Secondary | ICD-10-CM | POA: Diagnosis not present

## 2015-02-22 DIAGNOSIS — K219 Gastro-esophageal reflux disease without esophagitis: Secondary | ICD-10-CM | POA: Insufficient documentation

## 2015-02-22 DIAGNOSIS — J449 Chronic obstructive pulmonary disease, unspecified: Secondary | ICD-10-CM | POA: Insufficient documentation

## 2015-02-22 DIAGNOSIS — Z79899 Other long term (current) drug therapy: Secondary | ICD-10-CM | POA: Diagnosis not present

## 2015-02-22 DIAGNOSIS — N182 Chronic kidney disease, stage 2 (mild): Secondary | ICD-10-CM | POA: Diagnosis not present

## 2015-02-22 DIAGNOSIS — D631 Anemia in chronic kidney disease: Secondary | ICD-10-CM | POA: Insufficient documentation

## 2015-02-22 DIAGNOSIS — I2510 Atherosclerotic heart disease of native coronary artery without angina pectoris: Secondary | ICD-10-CM | POA: Diagnosis not present

## 2015-02-22 DIAGNOSIS — G4700 Insomnia, unspecified: Secondary | ICD-10-CM | POA: Diagnosis not present

## 2015-02-22 DIAGNOSIS — M7989 Other specified soft tissue disorders: Secondary | ICD-10-CM | POA: Diagnosis not present

## 2015-02-22 DIAGNOSIS — E119 Type 2 diabetes mellitus without complications: Secondary | ICD-10-CM | POA: Insufficient documentation

## 2015-02-22 DIAGNOSIS — M1990 Unspecified osteoarthritis, unspecified site: Secondary | ICD-10-CM | POA: Insufficient documentation

## 2015-02-22 DIAGNOSIS — G8929 Other chronic pain: Secondary | ICD-10-CM | POA: Insufficient documentation

## 2015-02-22 DIAGNOSIS — Z7982 Long term (current) use of aspirin: Secondary | ICD-10-CM | POA: Diagnosis not present

## 2015-02-22 DIAGNOSIS — E039 Hypothyroidism, unspecified: Secondary | ICD-10-CM | POA: Diagnosis not present

## 2015-02-22 DIAGNOSIS — Z87891 Personal history of nicotine dependence: Secondary | ICD-10-CM | POA: Diagnosis not present

## 2015-02-22 LAB — COMPREHENSIVE METABOLIC PANEL
ALT: 14 U/L (ref 14–54)
ANION GAP: 7 (ref 5–15)
AST: 19 U/L (ref 15–41)
Albumin: 3.4 g/dL — ABNORMAL LOW (ref 3.5–5.0)
Alkaline Phosphatase: 84 U/L (ref 38–126)
BUN: 22 mg/dL — ABNORMAL HIGH (ref 6–20)
CALCIUM: 8.4 mg/dL — AB (ref 8.9–10.3)
CHLORIDE: 101 mmol/L (ref 101–111)
CO2: 34 mmol/L — AB (ref 22–32)
CREATININE: 0.97 mg/dL (ref 0.44–1.00)
GFR calc non Af Amer: 59 mL/min — ABNORMAL LOW (ref >60)
Glucose, Bld: 93 mg/dL (ref 65–99)
Potassium: 3.5 mmol/L (ref 3.5–5.1)
Sodium: 142 mmol/L (ref 135–145)
Total Bilirubin: 0.6 mg/dL (ref 0.3–1.2)
Total Protein: 6.5 g/dL (ref 6.5–8.1)

## 2015-02-22 LAB — CBC WITH DIFFERENTIAL/PLATELET
Basophils Absolute: 0 10*3/uL (ref 0.0–0.1)
Basophils Relative: 0 % (ref 0–1)
EOS PCT: 2 % (ref 0–5)
Eosinophils Absolute: 0.1 10*3/uL (ref 0.0–0.7)
HCT: 34 % — ABNORMAL LOW (ref 36.0–46.0)
Hemoglobin: 10.5 g/dL — ABNORMAL LOW (ref 12.0–15.0)
LYMPHS ABS: 1.3 10*3/uL (ref 0.7–4.0)
Lymphocytes Relative: 19 % (ref 12–46)
MCH: 31.5 pg (ref 26.0–34.0)
MCHC: 30.9 g/dL (ref 30.0–36.0)
MCV: 102.1 fL — ABNORMAL HIGH (ref 78.0–100.0)
Monocytes Absolute: 0.7 10*3/uL (ref 0.1–1.0)
Monocytes Relative: 10 % (ref 3–12)
NEUTROS ABS: 4.5 10*3/uL (ref 1.7–7.7)
NEUTROS PCT: 69 % (ref 43–77)
Platelets: 145 10*3/uL — ABNORMAL LOW (ref 150–400)
RBC: 3.33 MIL/uL — AB (ref 3.87–5.11)
RDW: 14.5 % (ref 11.5–15.5)
WBC: 6.5 10*3/uL (ref 4.0–10.5)

## 2015-02-22 MED ORDER — FUROSEMIDE 40 MG PO TABS: 80.0000 mg | ORAL_TABLET | Freq: Every day | ORAL | Status: DC

## 2015-02-22 NOTE — ED Notes (Signed)
Patient with no complaints at this time. Respirations even and unlabored. Skin warm/dry. Discharge instructions reviewed with patient at this time. Patient given opportunity to voice concerns/ask questions. IV removed per policy and band-aid applied to site. Patient discharged at this time and left Emergency Department via wheelchair.  

## 2015-02-22 NOTE — ED Notes (Signed)
Chronic bilateral lower leg edema with worsening over the past week.

## 2015-02-22 NOTE — ED Provider Notes (Signed)
CSN: [id number]     Arrival date & time 02/22/15  1029 History   This chart was scribed for Kara Porter, MD by Andrew Au, ED Scribe. This patient was seen in room APA05/AP[facility]05 and the patient's care was started at 10:46 AM. Chief Complaint  Patient presents with  . Leg Swelling   The history is provided by the patient. No language interpreter was used.    HPI Comments:  Kara [name] is a 69 y.o. female wiwho present to the Emergency Department complaining of bilateral leg swelling. Pt has had leg swelling since being discharged from [facility] but states swelling has worsened in the past week with some soreness. She takes lasix and recently started a new medication Friday prescribed by her PCP.  She was told to f/u with cardiologist but has not been able to be seen due to them not having available appointments. She denies change in her diet including eating foods containing salt and sodium.  Pt is blind.  Past Medical History  Diagnosis Date  . Type 2 diabetes mellitus   . Arthritis   . COPD (chronic obstructive pulmonary disease)     Home oxygen  . Palpitations     Reports history of atrial fibrillation, although none seen recently  . Coronary atherosclerosis of native coronary artery     Nonobstructive at catheterization 2008  . GERD (gastroesophageal reflux disease)   . Irritable bowel syndrome   . Hypothyroidism   . Hyperlipidemia   . Chronic edema   . Pneumonia   . Essential hypertension   . Chronic pain   . Anemia of chronic disease   . Bulging lumbar disc     3  . Headache(784.0)   . Sprain of neck   . Asthma   . Visual loss, left eye   . CKD (chronic kidney disease), stage II   . Claustrophobia   . Temporal arteritis   . Memory difficulty   . Myocardial infarction   . Chronic insomnia 02/17/2015   Past Surgical History  Procedure Laterality Date  . Cholecystectomy    . Appendectomy    . Foot surgery    . Abdominal hysterectomy    . Cataract extraction w/phaco  Left 09/01/2013    Procedure: CATARACT EXTRACTION PHACO AND INTRAOCULAR LENS PLACEMENT (IOC);  Surgeon: Gemma Payor, MD;  Location: [facility];  Service: Ophthalmology;  Laterality: Left;  CDE:  14.13  . Artery biopsy Left 09/22/2013    Procedure: BIOPSY TEMPORAL ARTERY;  Surgeon: Mariella Saa, MD;  Location: [facility] OR;  Service: General;  Laterality: Left;   Family History  Problem Relation Age of Onset  . Hypertension    . Diabetes Brother   . Migraines Brother    History  Substance Use Topics  . Smoking status: Former Smoker -- 1.00 packs/day for 48 years    Types: Cigarettes    Start date: 05/26/1959    Quit date: 12/28/1995  . Smokeless tobacco: Never Used  . Alcohol Use: No   OB History    Gravida Para Term Preterm AB TAB SAB Ectopic Multiple Living   Review of Systems  Constitutional: Negative for fever, chills, diaphoresis, appetite change and fatigue.  HENT: Negative for mouth sores, sore throat and trouble swallowing.   Eyes: Negative for visual disturbance.  Respiratory: Negative for cough, chest tightness, shortness of breath and wheezing.   Cardiovascular: Positive for leg swelling. Negative  for chest pain.  Gastrointestinal: Negative for nausea, vomiting, abdominal pain, diarrhea and abdominal distention.  Endocrine: Negative for polydipsia, polyphagia and polyuria.  Genitourinary: Negative for dysuria, frequency and hematuria.  Musculoskeletal: Negative for gait problem.  Skin: Negative for color change, pallor and rash.  Neurological: Negative for dizziness, syncope, light-headedness and headaches.  Hematological: Does not bruise/bleed easily.  Psychiatric/Behavioral: Negative for behavioral problems and confusion.    Allergies  Codeine  Home Medications   Prior to Admission medications   Medication Sig Start Date End Date Taking? Authorizing Provider  acetaminophen (TYLENOL) 500 MG tablet Take 1,000 mg by mouth every 8 (eight) hours.    Yes  Historical Provider, MD  ALPRAZolam Prudy Feeler) 1 MG tablet Take 1 mg by mouth 3 (three) times daily.    Yes Historical Provider, MD  aspirin EC 81 MG tablet Take 81 mg by mouth daily.   Yes Historical Provider, MD  atorvastatin (LIPITOR) 80 MG tablet Take 1 tablet (80 mg total) by mouth daily. 02/05/15  Yes Jodelle Gross, NP  clopidogrel (PLAVIX) 75 MG tablet Take 1 tablet (75 mg total) by mouth daily. 02/05/15  Yes Jodelle Gross, NP  dexamethasone (DECADRON) 0.5 MG tablet Take 1 tablet (0.5 mg total) by mouth daily. Patient taking differently: Take 0.5 mg by mouth every other day.  02/17/15  Yes York Spaniel, MD  docusate sodium (COLACE) 100 MG capsule Take 2 capsules (200 mg total) by mouth daily. Patient taking differently: Take 200 mg by mouth daily as needed for mild constipation.  07/13/14  Yes Malissa Hippo, MD  donepezil (ARICEPT) 5 MG tablet Take 1 tablet (5 mg total) by mouth at bedtime. 02/17/15  Yes York Spaniel, MD  enalapril (VASOTEC) 2.5 MG tablet Take 1 tablet (2.5 mg total) by mouth daily. 02/05/15  Yes Jodelle Gross, NP  levothyroxine (SYNTHROID, LEVOTHROID) 100 MCG tablet Take 100 mcg by mouth daily.     Yes Historical Provider, MD  metFORMIN (GLUCOPHAGE) 1000 MG tablet Take 1,000 mg by mouth 2 (two) times daily with a meal.   Yes Historical Provider, MD  metoprolol tartrate (LOPRESSOR) 25 MG tablet Take 12.5 mg by mouth daily.   Yes Historical Provider, MD  morphine (MSIR) 30 MG tablet Take 30 mg by mouth 2 (two) times daily.    Yes Historical Provider, MD  nitroGLYCERIN (NITROSTAT) 0.4 MG SL tablet Place 1 tablet (0.4 mg total) under the tongue every 5 (five) minutes as needed for chest pain. 02/05/15  Yes Jodelle Gross, NP  ondansetron (ZOFRAN) 4 MG tablet Take 4 mg by mouth every 6 (six) hours as needed for nausea or vomiting.   Yes Historical Provider, MD  pantoprazole (PROTONIX) 40 MG tablet Take 40 mg by mouth 2 (two) times daily.   Yes Historical  Provider, MD  polyethylene glycol (MIRALAX / GLYCOLAX) packet Take 17 g by mouth daily as needed for mild constipation.   Yes Historical Provider, MD  Potassium Chloride Crys CR (KLOR-CON M20 PO) Take 20 mEq by mouth 2 (two) times daily.    Yes Historical Provider, MD  spironolactone (ALDACTONE) 25 MG tablet Take 25 mg by mouth daily.   Yes Historical Provider, MD  traZODone (DESYREL) 100 MG tablet Take 1 tablet (100 mg total) by mouth at bedtime. 02/17/15  Yes York Spaniel, MD  furosemide (LASIX) 40 MG tablet Take 2 tablets (80 mg total) by mouth daily. 02/22/15   Kara Porter, MD   BP 117/50  mmHg  Pulse 81  Temp(Src) 98.2 F (36.8 C) (Oral)  Resp 20  Ht 5\' 6"  (1.676 m)  Wt 140 lb (63.504 kg)  BMI 22.61 kg/m2  SpO2 100% Physical Exam  Constitutional: She is oriented to person, place, and time. She appears well-developed and well-nourished. No distress.  HENT:  Head: Normocephalic.  Eyes: Conjunctivae are normal. Pupils are equal, round, and reactive to light. No scleral icterus.  Neck: Normal range of motion. Neck supple. No thyromegaly present.  Cardiovascular: Normal rate and regular rhythm.  Exam reveals no gallop and no friction rub.   No murmur heard. Pulmonary/Chest: Effort normal and breath sounds normal. No respiratory distress. She has no wheezes. She has no rales.  Abdominal: Soft. Bowel sounds are normal. She exhibits no distension. There is no tenderness. There is no rebound.  Musculoskeletal: Normal range of motion.  2-3+ lower extremity edema  Neurological: She is alert and oriented to person, place, and time.  Skin: Skin is warm and dry. No rash noted.  Psychiatric: She has a normal mood and affect. Her behavior is normal.    ED Course  Procedures (including critical care time) DIAGNOSTIC STUDIES: Oxygen Saturation is 100% on RA, normal by my interpretation.    COORDINATION OF CARE: 11:00 AM- Pt advised of plan for treatment and pt agrees.  Labs Review Labs  Reviewed  CBC WITH DIFFERENTIAL/PLATELET - Abnormal; Notable for the following:    RBC 3.33 (*)    Hemoglobin 10.5 (*)    HCT 34.0 (*)    MCV 102.1 (*)    Platelets 145 (*)    All other components within normal limits  COMPREHENSIVE METABOLIC PANEL - Abnormal; Notable for the following:    CO2 34 (*)    BUN 22 (*)    Calcium 8.4 (*)    Albumin 3.4 (*)    GFR calc non Af Amer 59 (*)    All other components within normal limits    Imaging Review Dg Chest Port 1 View  02/22/2015   CLINICAL DATA:  Bilateral lower extremity edema for 2 weeks  EXAM: PORTABLE CHEST - 1 VIEW  COMPARISON:  January 08, 2015  FINDINGS: No edema or consolidation. Heart is slightly enlarged with pulmonary vascularity within normal limits. No adenopathy. No bone lesions.  IMPRESSION: Mild cardiac enlargement, stable.  No edema or consolidation.   Electronically Signed   By: Bretta Bang III M.D.   On: 02/22/2015 11:26     EKG Interpretation None      MDM   Final diagnoses:  Leg swelling    No sign of congestive heart failure. Baseline renal function. Increasing hemoglobin. Likely not infected. Not hot to the touch. Afebrile. No leukocytosis. Plan we will increase her Lasix to 80 daily for the next 5 days. We'll hold her current Lasix, and Aldactone until that time. Double her by mouth potassium doses. Primary care follow-up.  I personally performed the services described in this documentation, which was scribed in my presence. The recorded information has been reviewed and is accurate.    Kara Porter, MD 02/22/15 [phone]

## 2015-02-22 NOTE — Discharge Instructions (Signed)
New Rx for Lasix,  every morning for 5 days. Do not take your usual lasix, or Aldactone for 5 days, then restart.  Double you Potassium dose for the next 5 days. TED compression stockings from any pharmacy. Wear daily. Stay active.     Peripheral Edema You have swelling in your legs (peripheral edema). This swelling is due to excess accumulation of salt and water in your body. Edema may be a sign of heart, kidney or liver disease, or a side effect of a medication. It may also be due to problems in the leg veins. Elevating your legs and using special support stockings may be very helpful, if the cause of the swelling is due to poor venous circulation. Avoid long periods of standing, whatever the cause. Treatment of edema depends on identifying the cause. Chips, pretzels, pickles and other salty foods should be avoided. Restricting salt in your diet is almost always needed. Water pills (diuretics) are often used to remove the excess salt and water from your body via urine. These medicines prevent the kidney from reabsorbing sodium. This increases urine flow. Diuretic treatment may also result in lowering of potassium levels in your body. Potassium supplements may be needed if you have to use diuretics daily. Daily weights can help you keep track of your progress in clearing your edema. You should call your caregiver for follow up care as recommended. SEEK IMMEDIATE MEDICAL CARE IF:   You have increased swelling, pain, redness, or heat in your legs.  You develop shortness of breath, especially when lying down.  You develop chest or abdominal pain, weakness, or fainting.  You have a fever. Document Released: 08/17/2004 Document Revised: 10/02/2011 Document Reviewed: 07/28/2009 [facility] Patient Information 2015 [place], [place]. This information is not intended to replace advice given to you by your health care provider. Make sure you discuss any questions you have with your health care  provider.

## 2015-02-24 ENCOUNTER — Telehealth: Payer: Self-pay | Admitting: Cardiology

## 2015-02-24 ENCOUNTER — Telehealth: Payer: Self-pay | Admitting: Neurology

## 2015-02-24 NOTE — Telephone Encounter (Signed)
Patient called wanting to go over directions of the dexamethasone (DECADRON) 0.5 MG tablet . Please call and advise. Patient can be reached at [phone].

## 2015-02-24 NOTE — Telephone Encounter (Signed)
Patient would like return phone call regarding "leg swelling". / tg

## 2015-02-24 NOTE — Telephone Encounter (Signed)
I called the patient. She wanted to make sure she understood Dr. Anne Hahn' order for Decadron. I explained to her that she takes 1 tablet (0.5 mg) every other day. She took one today and I explained she will not take it again until Friday, and then Sunday. She verbalized understanding and will call back if she has any other questions.

## 2015-02-24 NOTE — Telephone Encounter (Signed)
Apt made for 02/25/15

## 2015-02-25 ENCOUNTER — Ambulatory Visit: Payer: Medicare HMO | Admitting: Cardiology

## 2015-02-26 ENCOUNTER — Ambulatory Visit (INDEPENDENT_AMBULATORY_CARE_PROVIDER_SITE_OTHER): Payer: Medicare HMO | Admitting: Cardiology

## 2015-02-26 ENCOUNTER — Other Ambulatory Visit: Payer: Self-pay | Admitting: Cardiology

## 2015-02-26 ENCOUNTER — Encounter: Payer: Self-pay | Admitting: Cardiology

## 2015-02-26 VITALS — BP 102/46 | HR 78 | Ht 66.0 in | Wt 158.0 lb

## 2015-02-26 DIAGNOSIS — I10 Essential (primary) hypertension: Secondary | ICD-10-CM

## 2015-02-26 DIAGNOSIS — I82403 Acute embolism and thrombosis of unspecified deep veins of lower extremity, bilateral: Secondary | ICD-10-CM

## 2015-02-26 DIAGNOSIS — N289 Disorder of kidney and ureter, unspecified: Secondary | ICD-10-CM

## 2015-02-26 DIAGNOSIS — M316 Other giant cell arteritis: Secondary | ICD-10-CM | POA: Diagnosis not present

## 2015-02-26 DIAGNOSIS — I519 Heart disease, unspecified: Secondary | ICD-10-CM | POA: Diagnosis not present

## 2015-02-26 DIAGNOSIS — R600 Localized edema: Secondary | ICD-10-CM

## 2015-02-26 MED ORDER — TORSEMIDE 20 MG PO TABS: 20.0000 mg | ORAL_TABLET | Freq: Two times a day (BID) | ORAL | Status: AC

## 2015-02-26 MED ORDER — METOLAZONE 2.5 MG PO TABS: 2.5000 mg | ORAL_TABLET | Freq: Every day | ORAL | Status: DC

## 2015-02-26 NOTE — Assessment & Plan Note (Signed)
Most recent EF 45-50% at [facility] June 2016

## 2015-02-26 NOTE — Progress Notes (Signed)
02/26/2015 Kara [name]   [birth year]  [id number]  Primary Physician Kara Reichert, MD Primary Cardiologist: Dr Diona Browner  HPI:  69 y/o female with multiple medical problems, and frequent ED and MD visits. She has a history of remote non obstructive CAD in '09, low risk Myoview Jan 2015, and presumed NICM who had a recent NSTEMI (Troponin 3.6) at [facility] this past June. Her EF at [facility] was actually better than her baseline- now 45-50%. She has COPD and is on chronic O2. She has temporal arteritis and is on chronic steroids. Other problems include chronic pain with past narcotic withdrawal problems, anxiety, DM, PVD with moderate RICA stenosis, and the pt tells me she has chronic LE edema.   She is her now because she feels her edema is worse. She was seen in the ED for this 02/22/15 and her lasix was increased to 40 mg TID, (it had just been increased to 40 mg BID by her PCP). She tells me this has not helped, she still has edema and its uncomfortable. Unfortunately this is the first time I have seen her so I don't know her baseline but its clear on exam she has had chronic venous edema issues previously.   Current Outpatient Prescriptions  Medication Sig Dispense Refill  . acetaminophen (TYLENOL) 500 MG tablet Take 1,000 mg by mouth every 8 (eight) hours.     . ALPRAZolam (XANAX) 1 MG tablet Take 1 mg by mouth 3 (three) times daily.     [name] aspirin EC 81 MG tablet Take 81 mg by mouth daily.    [name] atorvastatin (LIPITOR) 80 MG tablet Take 1 tablet (80 mg total) by mouth daily. 25 tablet 3  . clopidogrel (PLAVIX) 75 MG tablet Take 1 tablet (75 mg total) by mouth daily. 90 tablet 3  . dexamethasone (DECADRON) 0.5 MG tablet Take 1 tablet (0.5 mg total) by mouth daily. (Patient taking differently: Take 0.5 mg by mouth every other day. ) 30 tablet 3  . docusate sodium (COLACE) 100 MG capsule Take 2 capsules (200 mg total) by mouth daily. (Patient taking differently: Take 200 mg by mouth daily as needed for  mild constipation. ) 10 capsule 0  . donepezil (ARICEPT) 5 MG tablet Take 1 tablet (5 mg total) by mouth at bedtime. 30 tablet 5  . enalapril (VASOTEC) 2.5 MG tablet Take 1 tablet (2.5 mg total) by mouth daily. 90 tablet 3  . levothyroxine (SYNTHROID, LEVOTHROID) 100 MCG tablet Take 100 mcg by mouth daily.      . metFORMIN (GLUCOPHAGE) 1000 MG tablet Take 1,000 mg by mouth 2 (two) times daily with a meal.    . metolazone (ZAROXOLYN) 2.5 MG tablet Take 1 tablet (2.5 mg total) by mouth daily. 4 tablet 0  . metoprolol tartrate (LOPRESSOR) 25 MG tablet Take 12.5 mg by mouth daily.    [name] morphine (MSIR) 30 MG tablet Take 30 mg by mouth 2 (two) times daily.     . nitroGLYCERIN (NITROSTAT) 0.4 MG SL tablet Place 1 tablet (0.4 mg total) under the tongue every 5 (five) minutes as needed for chest pain. 25 tablet 3  . ondansetron (ZOFRAN) 4 MG tablet Take 4 mg by mouth every 6 (six) hours as needed for nausea or vomiting.    . pantoprazole (PROTONIX) 40 MG tablet Take 40 mg by mouth 2 (two) times daily.    . polyethylene glycol (MIRALAX / GLYCOLAX) packet Take 17 g by mouth daily as needed for mild constipation.    [name]  Potassium Chloride Crys CR (KLOR-CON M20 PO) Take 20 mEq by mouth 2 (two) times daily.     [name] torsemide (DEMADEX) 20 MG tablet Take 1 tablet (20 mg total) by mouth 2 (two) times daily. 60 tablet 6  . traZODone (DESYREL) 100 MG tablet Take 1 tablet (100 mg total) by mouth at bedtime. 30 tablet 3   No current facility-administered medications for this visit.    Allergies  Allergen Reactions  . Codeine Nausea And Vomiting and Rash    History   Social History  . Marital Status: Divorced    Spouse Name: N/A  . Number of Children: 2  . Years of Education: hs   Occupational History  . DISABLED    Social History Main Topics  . Smoking status: Former Smoker -- 1.00 packs/day for 48 years    Types: Cigarettes    Start date: 05/26/1959    Quit date: 12/28/1995  . Smokeless tobacco:  Never Used  . Alcohol Use: No  . Drug Use: No  . Sexual Activity: Not Currently   Other Topics Concern  . Not on file   Social History Narrative   Patient is divorced with 2 children.   Patient is right handed.   Patient has a high school education.   Patient does not drink caffeine.     Review of Systems: General: negative for chills, fever, night sweats or weight changes.  Cardiovascular: negative for chest pain, dyspnea on exertion, edema, orthopnea, palpitations, paroxysmal nocturnal dyspnea or shortness of breath Dermatological: negative for rash Respiratory: negative for cough or wheezing Urologic: negative for hematuria Abdominal: negative for nausea, vomiting, diarrhea, bright red blood per rectum, melena, or hematemesis Neurologic: negative for visual changes, syncope, or dizziness All other systems reviewed and are otherwise negative except as noted above.    Blood pressure 102/46, pulse 78, height 5\' 6"  (1.676 m), weight 158 lb (71.668 kg), SpO2 99 %.  General appearance: alert, cooperative and no distress Neck: no JVD Lungs: kyphosis, decreased breath sounds at bases Heart: regular rate and rhythm Extremities: 2+ rudy edema bilateraly, cool to touch. Skin: pale, cool, dry Neurologic: Grossly normal   ASSESSMENT AND PLAN:   Lower extremity edema Pt recently seen in the ED with acute on chronic bilateral LE edema. She has not responded to OP adjustment of her Lasix and is here now for further evaluation.   Systolic dysfunction Most recent EF 45-50% at [facility] June 2016  Renal insufficiency SCr 0.9 on 02/22/15  COPD (chronic obstructive pulmonary disease) Chronic O2  Temporal arteritis Chronic steroids, Dr Anne Hahn follows  Essential hypertension, benign HCVD, LVH   PLAN  I changed Kara [name]'s diuretic to Demedex 20 mg BID and added Metolazone 2.5 mg daily for the next 4 days. She'll get venous doppler and a BMP and f/u in the clinic next week.    [name] K PA-C 02/26/2015 3:51 PM

## 2015-02-26 NOTE — Assessment & Plan Note (Signed)
HCVD, LVH

## 2015-02-26 NOTE — Patient Instructions (Addendum)
Your physician recommends that you schedule a follow-up appointment on Thursday with Ronie Spies, PA-C.  Your physician has recommended you make the following change in your medication:   STOP TAKING:LASIX, AND ALDACTONE  START TAKING : TORSEMIDE 20 MG TWO TIMES DAILY METOLAZONE 2.5 MG TAKE ONE TABLET DAILY FOR THE NEXT FOUR DAYS   Your physician recommends that you return for lab work in: ON THE DAY YOU COME FOR DOPPLER US   Your physician has requested that you have a lower or upper extremity venous duplex. This test is an ultrasound of the veins in the legs or arms. It looks at venous blood flow that carries blood from the heart to the legs or arms. Allow one hour for a Lower Venous exam. Allow thirty minutes for an Upper Venous exam. There are no restrictions or special instructions.  Thank you for choosing [facility]!

## 2015-02-26 NOTE — Assessment & Plan Note (Signed)
Chronic steroids, Dr Anne Hahn follows

## 2015-02-26 NOTE — Assessment & Plan Note (Signed)
SCr 0.9 on 02/22/15

## 2015-02-26 NOTE — Assessment & Plan Note (Signed)
Chronic O2 

## 2015-02-26 NOTE — Assessment & Plan Note (Signed)
Pt recently seen in the ED with acute on chronic bilateral LE edema. She has not responded to OP adjustment of her Lasix and is here now for further evaluation.

## 2015-03-01 ENCOUNTER — Telehealth: Payer: Self-pay | Admitting: Neurology

## 2015-03-01 NOTE — Telephone Encounter (Signed)
I called the patient. She stated that the trazodone is not helping her sleep. She gets about 2-2.5 hours of sleep each night and then she is wide awake the rest of the night. She would like to try something else to help her sleep.

## 2015-03-01 NOTE — Telephone Encounter (Signed)
I called the patient. She is not sleeping well on 100 mg of trazodone at night, I asked her to go up to 200 mg at night for several nights to see if this works, if not, go to 300 mg at night. She will let me know if these doses work for her.

## 2015-03-01 NOTE — Telephone Encounter (Signed)
Patient called, traZODone (DESYREL) 100 MG tablet is not working for her, Dr. Anne Hahn originally prescribed this for her and she told him before it didn't work so she is unsure as to why he filled it again. It's not helping her. [facility] [phone]

## 2015-03-02 ENCOUNTER — Ambulatory Visit: Payer: Medicare HMO | Admitting: Neurology

## 2015-03-02 ENCOUNTER — Encounter: Payer: Self-pay | Admitting: Cardiology

## 2015-03-04 ENCOUNTER — Ambulatory Visit (HOSPITAL_COMMUNITY): Admission: RE | Admit: 2015-03-04 | Payer: Medicare HMO | Source: Ambulatory Visit

## 2015-03-04 ENCOUNTER — Telehealth: Payer: Self-pay | Admitting: Cardiology

## 2015-03-04 NOTE — Telephone Encounter (Signed)
Spoke with Kara [name], the pt has called and cancelled her venous dopplers for DVT. They wanted to let us know she rescheduled to 03/09/15 @ 11 am. They want to make sure that is okay with [place]. Will make luke kilroy pa aware

## 2015-03-05 ENCOUNTER — Ambulatory Visit: Payer: Medicare HMO | Admitting: Physician Assistant

## 2015-03-06 NOTE — Telephone Encounter (Signed)
OK 

## 2015-03-09 ENCOUNTER — Ambulatory Visit (HOSPITAL_COMMUNITY): Admission: RE | Admit: 2015-03-09 | Payer: Medicare HMO | Source: Ambulatory Visit

## 2015-03-14 ENCOUNTER — Other Ambulatory Visit (INDEPENDENT_AMBULATORY_CARE_PROVIDER_SITE_OTHER): Payer: Self-pay | Admitting: Internal Medicine

## 2015-03-15 ENCOUNTER — Telehealth: Payer: Self-pay | Admitting: Cardiology

## 2015-03-15 NOTE — Telephone Encounter (Signed)
LM on private VM with Ms lawrences's advice below

## 2015-03-15 NOTE — Telephone Encounter (Signed)
Sent to Harriet Pho NP

## 2015-03-15 NOTE — Telephone Encounter (Signed)
-----   Message from Jodelle Gross, NP sent at 03/15/2015  1:58 PM EDT ----- She can stop Plavix for 5 days prior to hip injection IF NECESSARY by her physician. She will need to restart the same day.  Thanks

## 2015-03-15 NOTE — Telephone Encounter (Signed)
Pt wants to know if she can go off of her blood thinner for a steriod injection in her hip

## 2015-03-16 ENCOUNTER — Telehealth: Payer: Self-pay

## 2015-03-16 NOTE — Telephone Encounter (Signed)
PT made aware that she could stop Plavix 5 days prior to an injection in her hip, but she would need to start it back the same day after the injection. She voiced understanding.

## 2015-03-17 ENCOUNTER — Telehealth: Payer: Self-pay | Admitting: Neurology

## 2015-03-17 ENCOUNTER — Other Ambulatory Visit: Payer: Self-pay | Admitting: Cardiology

## 2015-03-17 NOTE — Telephone Encounter (Signed)
Patient called regarding traZODone (DESYREL) 100 MG tablet, feels like medication isn't working, would like to try something different. Sleeps for a couple of hours and then wakes up. Does this several times per night.

## 2015-03-17 NOTE — Telephone Encounter (Signed)
I called the patient. Per Dr. Anne Hahn' telephone note on 03/01/15, the patient was supposed to be taking 200 mg of Trazodone at night and if that didn't work, 300 mg. She has only been taking 100 mg. I advised she take 2 tablets for a few nights and let us know if that doesn't help. She verbalized understanding.

## 2015-03-18 ENCOUNTER — Telehealth: Payer: Self-pay | Admitting: Neurology

## 2015-03-18 MED ORDER — TRAZODONE HCL 100 MG PO TABS: 200.0000 mg | ORAL_TABLET | Freq: Every day | ORAL | Status: AC

## 2015-03-18 NOTE — Telephone Encounter (Signed)
Tammy at [facility] called requesting new RX for Trazodone. She can be reached at [phone]

## 2015-03-18 NOTE — Telephone Encounter (Signed)
I called back to clarify.  They said patient is requesting a Rx for increased dose.   Per previous note:  York Spaniel, MD at 03/01/2015 6:39 PM     Status: Signed       Expand All Collapse All   I called the patient. She is not sleeping well on 100 mg of trazodone at night, I asked her to go up to 200 mg at night for several nights to see if this works, if not, go to 300 mg at night. She will let me know if these doses work for her.       Rx has been updated and sent.  Receipt confirmed by pharmacy.

## 2015-03-22 ENCOUNTER — Other Ambulatory Visit: Payer: Self-pay

## 2015-03-22 ENCOUNTER — Encounter (HOSPITAL_COMMUNITY): Payer: Self-pay | Admitting: Emergency Medicine

## 2015-03-22 ENCOUNTER — Ambulatory Visit (HOSPITAL_COMMUNITY)
Admission: RE | Admit: 2015-03-22 | Discharge: 2015-03-22 | Disposition: A | Discharge status: Home / Self Care | Payer: Medicare HMO | Source: Ambulatory Visit | Attending: Cardiology | Admitting: Cardiology

## 2015-03-22 ENCOUNTER — Telehealth: Payer: Self-pay | Admitting: *Deleted

## 2015-03-22 ENCOUNTER — Emergency Department (HOSPITAL_COMMUNITY): Payer: Medicare HMO

## 2015-03-22 ENCOUNTER — Inpatient Hospital Stay (HOSPITAL_COMMUNITY)
Admission: EM | Admit: 2015-03-22 | Discharge: 2015-03-24 | DRG: 313 | Disposition: A | Discharge status: Home / Self Care | Payer: Medicare HMO | Attending: Family Medicine | Admitting: Family Medicine

## 2015-03-22 DIAGNOSIS — N289 Disorder of kidney and ureter, unspecified: Secondary | ICD-10-CM

## 2015-03-22 DIAGNOSIS — I2699 Other pulmonary embolism without acute cor pulmonale: Secondary | ICD-10-CM

## 2015-03-22 DIAGNOSIS — Z87891 Personal history of nicotine dependence: Secondary | ICD-10-CM

## 2015-03-22 DIAGNOSIS — K219 Gastro-esophageal reflux disease without esophagitis: Secondary | ICD-10-CM | POA: Diagnosis present

## 2015-03-22 DIAGNOSIS — R0789 Other chest pain: Secondary | ICD-10-CM | POA: Diagnosis present

## 2015-03-22 DIAGNOSIS — R600 Localized edema: Secondary | ICD-10-CM | POA: Insufficient documentation

## 2015-03-22 DIAGNOSIS — I959 Hypotension, unspecified: Secondary | ICD-10-CM

## 2015-03-22 DIAGNOSIS — Z79899 Other long term (current) drug therapy: Secondary | ICD-10-CM | POA: Diagnosis not present

## 2015-03-22 DIAGNOSIS — I4892 Unspecified atrial flutter: Secondary | ICD-10-CM | POA: Diagnosis present

## 2015-03-22 DIAGNOSIS — I10 Essential (primary) hypertension: Secondary | ICD-10-CM

## 2015-03-22 DIAGNOSIS — I480 Paroxysmal atrial fibrillation: Secondary | ICD-10-CM | POA: Diagnosis present

## 2015-03-22 DIAGNOSIS — Z7902 Long term (current) use of antithrombotics/antiplatelets: Secondary | ICD-10-CM | POA: Diagnosis not present

## 2015-03-22 DIAGNOSIS — R079 Chest pain, unspecified: Secondary | ICD-10-CM

## 2015-03-22 DIAGNOSIS — D539 Nutritional anemia, unspecified: Secondary | ICD-10-CM | POA: Diagnosis present

## 2015-03-22 DIAGNOSIS — E1122 Type 2 diabetes mellitus with diabetic chronic kidney disease: Secondary | ICD-10-CM | POA: Diagnosis present

## 2015-03-22 DIAGNOSIS — I252 Old myocardial infarction: Secondary | ICD-10-CM

## 2015-03-22 DIAGNOSIS — Z7982 Long term (current) use of aspirin: Secondary | ICD-10-CM | POA: Diagnosis not present

## 2015-03-22 DIAGNOSIS — E039 Hypothyroidism, unspecified: Secondary | ICD-10-CM | POA: Diagnosis present

## 2015-03-22 DIAGNOSIS — Z7952 Long term (current) use of systemic steroids: Secondary | ICD-10-CM

## 2015-03-22 DIAGNOSIS — Z8249 Family history of ischemic heart disease and other diseases of the circulatory system: Secondary | ICD-10-CM | POA: Diagnosis not present

## 2015-03-22 DIAGNOSIS — N179 Acute kidney failure, unspecified: Secondary | ICD-10-CM | POA: Diagnosis present

## 2015-03-22 DIAGNOSIS — Z833 Family history of diabetes mellitus: Secondary | ICD-10-CM | POA: Diagnosis not present

## 2015-03-22 DIAGNOSIS — J449 Chronic obstructive pulmonary disease, unspecified: Secondary | ICD-10-CM | POA: Diagnosis present

## 2015-03-22 DIAGNOSIS — E861 Hypovolemia: Secondary | ICD-10-CM | POA: Diagnosis present

## 2015-03-22 DIAGNOSIS — D638 Anemia in other chronic diseases classified elsewhere: Secondary | ICD-10-CM | POA: Diagnosis present

## 2015-03-22 DIAGNOSIS — I82403 Acute embolism and thrombosis of unspecified deep veins of lower extremity, bilateral: Secondary | ICD-10-CM

## 2015-03-22 DIAGNOSIS — I2510 Atherosclerotic heart disease of native coronary artery without angina pectoris: Secondary | ICD-10-CM | POA: Diagnosis present

## 2015-03-22 DIAGNOSIS — E785 Hyperlipidemia, unspecified: Secondary | ICD-10-CM | POA: Diagnosis present

## 2015-03-22 DIAGNOSIS — I129 Hypertensive chronic kidney disease with stage 1 through stage 4 chronic kidney disease, or unspecified chronic kidney disease: Secondary | ICD-10-CM | POA: Diagnosis present

## 2015-03-22 DIAGNOSIS — N182 Chronic kidney disease, stage 2 (mild): Secondary | ICD-10-CM | POA: Diagnosis present

## 2015-03-22 DIAGNOSIS — E119 Type 2 diabetes mellitus without complications: Secondary | ICD-10-CM

## 2015-03-22 LAB — CBC WITH DIFFERENTIAL/PLATELET
BASOS ABS: 0 10*3/uL (ref 0.0–0.1)
Basophils Relative: 0 % (ref 0–1)
Eosinophils Absolute: 0.1 10*3/uL (ref 0.0–0.7)
Eosinophils Relative: 1 % (ref 0–5)
HCT: 28.5 % — ABNORMAL LOW (ref 36.0–46.0)
Hemoglobin: 9.2 g/dL — ABNORMAL LOW (ref 12.0–15.0)
LYMPHS ABS: 1.3 10*3/uL (ref 0.7–4.0)
LYMPHS PCT: 10 % — AB (ref 12–46)
MCH: 33.1 pg (ref 26.0–34.0)
MCHC: 32.3 g/dL (ref 30.0–36.0)
MCV: 102.5 fL — AB (ref 78.0–100.0)
MONO ABS: 1.2 10*3/uL — AB (ref 0.1–1.0)
Monocytes Relative: 9 % (ref 3–12)
NEUTROS ABS: 10.1 10*3/uL — AB (ref 1.7–7.7)
Neutrophils Relative %: 80 % — ABNORMAL HIGH (ref 43–77)
Platelets: 168 10*3/uL (ref 150–400)
RBC: 2.78 MIL/uL — ABNORMAL LOW (ref 3.87–5.11)
RDW: 14.8 % (ref 11.5–15.5)
WBC: 12.6 10*3/uL — ABNORMAL HIGH (ref 4.0–10.5)

## 2015-03-22 LAB — FOLATE: Folate: 13.8 ng/mL (ref >5.9)

## 2015-03-22 LAB — FERRITIN: FERRITIN: 264 ng/mL (ref 11–307)

## 2015-03-22 LAB — COMPREHENSIVE METABOLIC PANEL
ALT: 8 U/L — AB (ref 14–54)
AST: 13 U/L — ABNORMAL LOW (ref 15–41)
Albumin: 3 g/dL — ABNORMAL LOW (ref 3.5–5.0)
Alkaline Phosphatase: 88 U/L (ref 38–126)
Anion gap: 7 (ref 5–15)
BILIRUBIN TOTAL: 0.5 mg/dL (ref 0.3–1.2)
BUN: 35 mg/dL — AB (ref 6–20)
CO2: 32 mmol/L (ref 22–32)
CREATININE: 1.77 mg/dL — AB (ref 0.44–1.00)
Calcium: 7.3 mg/dL — ABNORMAL LOW (ref 8.9–10.3)
Chloride: 99 mmol/L — ABNORMAL LOW (ref 101–111)
GFR calc Af Amer: 33 mL/min — ABNORMAL LOW (ref >60)
GFR calc non Af Amer: 28 mL/min — ABNORMAL LOW (ref >60)
Glucose, Bld: 122 mg/dL — ABNORMAL HIGH (ref 65–99)
POTASSIUM: 4.7 mmol/L (ref 3.5–5.1)
Sodium: 138 mmol/L (ref 135–145)
TOTAL PROTEIN: 5.8 g/dL — AB (ref 6.5–8.1)

## 2015-03-22 LAB — URINALYSIS, ROUTINE W REFLEX MICROSCOPIC
Bilirubin Urine: NEGATIVE
GLUCOSE, UA: NEGATIVE mg/dL
Hgb urine dipstick: NEGATIVE
Ketones, ur: NEGATIVE mg/dL
Leukocytes, UA: NEGATIVE
Nitrite: NEGATIVE
Protein, ur: NEGATIVE mg/dL
Specific Gravity, Urine: 1.01 (ref 1.005–1.030)
Urobilinogen, UA: 0.2 mg/dL (ref 0.0–1.0)
pH: 5 (ref 5.0–8.0)

## 2015-03-22 LAB — D-DIMER, QUANTITATIVE (NOT AT ARMC): D DIMER QUANT: 0.82 ug{FEU}/mL — AB (ref 0.00–0.48)

## 2015-03-22 LAB — IRON AND TIBC
IRON: 15 ug/dL — AB (ref 28–170)
Saturation Ratios: 7 % — ABNORMAL LOW (ref 10.4–31.8)
TIBC: 202 ug/dL — AB (ref 250–450)
UIBC: 187 ug/dL

## 2015-03-22 LAB — VITAMIN B12: VITAMIN B 12: 307 pg/mL (ref 180–914)

## 2015-03-22 LAB — BRAIN NATRIURETIC PEPTIDE: B Natriuretic Peptide: 121 pg/mL — ABNORMAL HIGH (ref 0.0–100.0)

## 2015-03-22 LAB — TROPONIN I

## 2015-03-22 LAB — RETICULOCYTES
RBC.: 2.7 MIL/uL — ABNORMAL LOW (ref 3.87–5.11)
RETIC COUNT ABSOLUTE: 121.5 10*3/uL (ref 19.0–186.0)
Retic Ct Pct: 4.5 % — ABNORMAL HIGH (ref 0.4–3.1)

## 2015-03-22 LAB — I-STAT CG4 LACTIC ACID, ED: Lactic Acid, Venous: 1.58 mmol/L (ref 0.5–2.0)

## 2015-03-22 LAB — TSH: TSH: 1.324 u[IU]/mL (ref 0.350–4.500)

## 2015-03-22 MED ORDER — SODIUM CHLORIDE 0.9 % IV SOLN
INTRAVENOUS | Status: DC
  Administered 2015-03-22 – 2015-03-23 (×2): via INTRAVENOUS

## 2015-03-22 MED ORDER — SODIUM CHLORIDE 0.9 % IJ SOLN
3.0000 mL | Freq: Two times a day (BID) | INTRAMUSCULAR | Status: DC
  Administered 2015-03-22 – 2015-03-24 (×2): 3 mL via INTRAVENOUS

## 2015-03-22 MED ORDER — DOCUSATE SODIUM 100 MG PO CAPS
200.0000 mg | ORAL_CAPSULE | Freq: Every day | ORAL | Status: DC
  Administered 2015-03-22 – 2015-03-24 (×3): 200 mg via ORAL
  Filled 2015-03-22 (×3): qty 2

## 2015-03-22 MED ORDER — TORSEMIDE 20 MG PO TABS
20.0000 mg | ORAL_TABLET | Freq: Two times a day (BID) | ORAL | Status: DC
  Filled 2015-03-22: qty 1

## 2015-03-22 MED ORDER — PANTOPRAZOLE SODIUM 40 MG PO TBEC: 40.0000 mg | DELAYED_RELEASE_TABLET | Freq: Every day | ORAL | Status: DC

## 2015-03-22 MED ORDER — MORPHINE SULFATE 15 MG PO TABS
30.0000 mg | ORAL_TABLET | Freq: Two times a day (BID) | ORAL | Status: DC
  Administered 2015-03-23 – 2015-03-24 (×3): 30 mg via ORAL
  Filled 2015-03-22 (×4): qty 2

## 2015-03-22 MED ORDER — DEXAMETHASONE 0.5 MG PO TABS
0.5000 mg | ORAL_TABLET | Freq: Every day | ORAL | Status: DC
  Administered 2015-03-23 – 2015-03-24 (×2): 0.5 mg via ORAL
  Filled 2015-03-22 (×4): qty 1

## 2015-03-22 MED ORDER — ENALAPRIL MALEATE 2.5 MG PO TABS: 2.5000 mg | ORAL_TABLET | Freq: Every day | ORAL | Status: DC

## 2015-03-22 MED ORDER — ALPRAZOLAM 1 MG PO TABS
1.0000 mg | ORAL_TABLET | Freq: Three times a day (TID) | ORAL | Status: DC
  Administered 2015-03-22 – 2015-03-24 (×5): 1 mg via ORAL
  Filled 2015-03-22 (×5): qty 1

## 2015-03-22 MED ORDER — POLYETHYLENE GLYCOL 3350 17 G PO PACK: 17.0000 g | PACK | Freq: Every day | ORAL | Status: DC | PRN

## 2015-03-22 MED ORDER — PANTOPRAZOLE SODIUM 40 MG PO TBEC
40.0000 mg | DELAYED_RELEASE_TABLET | Freq: Two times a day (BID) | ORAL | Status: DC
  Administered 2015-03-22 – 2015-03-24 (×4): 40 mg via ORAL
  Filled 2015-03-22 (×4): qty 1

## 2015-03-22 MED ORDER — DONEPEZIL HCL 5 MG PO TABS
5.0000 mg | ORAL_TABLET | Freq: Every day | ORAL | Status: DC
  Administered 2015-03-22 – 2015-03-23 (×2): 5 mg via ORAL
  Filled 2015-03-22 (×2): qty 1

## 2015-03-22 MED ORDER — TRAZODONE HCL 50 MG PO TABS
200.0000 mg | ORAL_TABLET | Freq: Every day | ORAL | Status: DC
  Administered 2015-03-22 – 2015-03-23 (×2): 200 mg via ORAL
  Filled 2015-03-22 (×2): qty 4

## 2015-03-22 MED ORDER — POTASSIUM CHLORIDE CRYS ER 20 MEQ PO TBCR
20.0000 meq | EXTENDED_RELEASE_TABLET | Freq: Two times a day (BID) | ORAL | Status: DC
  Administered 2015-03-23 – 2015-03-24 (×3): 20 meq via ORAL
  Filled 2015-03-22 (×4): qty 1

## 2015-03-22 MED ORDER — CLOPIDOGREL BISULFATE 75 MG PO TABS
75.0000 mg | ORAL_TABLET | Freq: Every day | ORAL | Status: DC
  Administered 2015-03-23 – 2015-03-24 (×2): 75 mg via ORAL
  Filled 2015-03-22 (×2): qty 1

## 2015-03-22 MED ORDER — ACETAMINOPHEN 500 MG PO TABS
1000.0000 mg | ORAL_TABLET | Freq: Three times a day (TID) | ORAL | Status: DC
  Administered 2015-03-22 – 2015-03-24 (×5): 1000 mg via ORAL
  Filled 2015-03-22 (×5): qty 2

## 2015-03-22 MED ORDER — ASPIRIN EC 81 MG PO TBEC
81.0000 mg | DELAYED_RELEASE_TABLET | Freq: Every day | ORAL | Status: DC
  Administered 2015-03-23 – 2015-03-24 (×2): 81 mg via ORAL
  Filled 2015-03-22 (×2): qty 1

## 2015-03-22 MED ORDER — ONDANSETRON HCL 4 MG/2ML IJ SOLN: 4.0000 mg | Freq: Four times a day (QID) | INTRAMUSCULAR | Status: DC | PRN

## 2015-03-22 MED ORDER — ATORVASTATIN CALCIUM 40 MG PO TABS
80.0000 mg | ORAL_TABLET | Freq: Every day | ORAL | Status: DC
  Administered 2015-03-22 – 2015-03-24 (×3): 80 mg via ORAL
  Filled 2015-03-22: qty 1
  Filled 2015-03-22 (×2): qty 2
  Filled 2015-03-22: qty 1
  Filled 2015-03-22: qty 2
  Filled 2015-03-22: qty 1
  Filled 2015-03-22: qty 2

## 2015-03-22 MED ORDER — ASPIRIN EC 81 MG PO TBEC: 81.0000 mg | DELAYED_RELEASE_TABLET | Freq: Every day | ORAL | Status: DC

## 2015-03-22 MED ORDER — ONDANSETRON HCL 4 MG PO TABS: 4.0000 mg | ORAL_TABLET | Freq: Four times a day (QID) | ORAL | Status: DC | PRN

## 2015-03-22 MED ORDER — NITROGLYCERIN 0.4 MG SL SUBL: 0.4000 mg | SUBLINGUAL_TABLET | SUBLINGUAL | Status: DC | PRN

## 2015-03-22 MED ORDER — LEVOTHYROXINE SODIUM 100 MCG PO TABS
100.0000 ug | ORAL_TABLET | Freq: Every day | ORAL | Status: DC
  Administered 2015-03-23 – 2015-03-24 (×2): 100 ug via ORAL
  Filled 2015-03-22 (×2): qty 1

## 2015-03-22 MED ORDER — CLOPIDOGREL BISULFATE 75 MG PO TABS: 75.0000 mg | ORAL_TABLET | Freq: Every day | ORAL | Status: DC

## 2015-03-22 MED ORDER — LEVOTHYROXINE SODIUM 100 MCG PO TABS: 100.0000 ug | ORAL_TABLET | Freq: Every day | ORAL | Status: DC

## 2015-03-22 MED ORDER — ENOXAPARIN SODIUM 30 MG/0.3ML ~~LOC~~ SOLN
30.0000 mg | SUBCUTANEOUS | Status: DC
  Administered 2015-03-22: 30 mg via SUBCUTANEOUS
  Filled 2015-03-22: qty 0.3

## 2015-03-22 NOTE — H&P (Signed)
[facility] History and Physical  MURL ZOGG ZOX:[id number] DOB: May 10, 1946 DOA: 03/22/2015  Referring physician: ER PCP: [name], MD   Chief Complaint: Chest pain  HPI: Kara [name] is a 69 y.o. female  This is a 69 year old lady who gives a history of chest pain intermittently for the last 2-3 weeks. She says that this is typically associated with palpitations but overall she is a poor historian and it is difficult to obtain a concise history. She has had poor oral intake over the last week or 2 with nausea. She has chronic kidney disease, diabetes, paroxysmal atrial fibrillation/flutter and a history of non-stress ST elevation MI in June 2016 at [facility]. Evaluation in the emergency room  found her to be hypotensive. She is now being admitted for further evaluation. She has already been seen by cardiology, Dr. Wyline Mood. She went to see Dr. Diona Browner in the office today, who sent her to the emergency room.   Review of Systems:  Apart from symptoms above, all systems negative.  Past Medical History  Diagnosis Date  . Type 2 diabetes mellitus   . Arthritis   . COPD (chronic obstructive pulmonary disease)     Home oxygen  . Palpitations     Reports history of atrial fibrillation, although none seen recently  . Coronary atherosclerosis of native coronary artery     Nonobstructive at catheterization 2008  . GERD (gastroesophageal reflux disease)   . Irritable bowel syndrome   . Hypothyroidism   . Hyperlipidemia   . Chronic edema   . Pneumonia   . Essential hypertension   . Chronic pain   . Anemia of chronic disease   . Bulging lumbar disc     3  . Headache(784.0)   . Sprain of neck   . Asthma   . Visual loss, left eye   . CKD (chronic kidney disease), stage II   . Claustrophobia   . Temporal arteritis   . Memory difficulty   . Myocardial infarction   . Chronic insomnia 02/17/2015   Past Surgical History  Procedure Laterality Date  .  Cholecystectomy    . Appendectomy    . Foot surgery    . Abdominal hysterectomy    . Cataract extraction w/phaco Left 09/01/2013    Procedure: CATARACT EXTRACTION PHACO AND INTRAOCULAR LENS PLACEMENT (IOC);  Surgeon: Gemma Payor, MD;  Location: [facility]S;  Service: Ophthalmology;  Laterality: Left;  CDE:  14.13  . Artery biopsy Left 09/22/2013    Procedure: BIOPSY TEMPORAL ARTERY;  Surgeon: Mariella Saa, MD;  Location: [facility] OR;  Service: General;  Laterality: Left;   Social History:  reports that she quit smoking about 19 years ago. Her smoking use included Cigarettes. She started smoking about 55 years ago. She has a 48 pack-year smoking history. She has never used smokeless tobacco. She reports that she does not drink alcohol or use illicit drugs.  Allergies  Allergen Reactions  . Codeine Nausea And Vomiting and Rash    Family History  Problem Relation Age of Onset  . Hypertension    . Diabetes Brother   . Migraines Brother     Prior to Admission medications   Medication Sig Start Date End Date Taking? Authorizing Provider  acetaminophen (TYLENOL) 500 MG tablet Take 1,000 mg by mouth every 8 (eight) hours.     Historical Provider, MD  ALPRAZolam Prudy Feeler) 1 MG tablet Take 1 mg by mouth 3 (three) times daily.     Historical  Provider, MD  aspirin EC 81 MG tablet Take 81 mg by mouth daily.    Historical Provider, MD  atorvastatin (LIPITOR) 80 MG tablet Take 1 tablet (80 mg total) by mouth daily. 02/05/15   Jodelle Gross, NP  clopidogrel (PLAVIX) 75 MG tablet Take 1 tablet (75 mg total) by mouth daily. 02/05/15   Jodelle Gross, NP  dexamethasone (DECADRON) 0.5 MG tablet Take 1 tablet (0.5 mg total) by mouth daily. Patient taking differently: Take 0.5 mg by mouth every other day.  02/17/15   York Spaniel, MD  docusate sodium (COLACE) 100 MG capsule Take 2 capsules (200 mg total) by mouth daily. Patient taking differently: Take 200 mg by mouth daily as needed for mild constipation.   07/13/14   Malissa Hippo, MD  donepezil (ARICEPT) 5 MG tablet Take 1 tablet (5 mg total) by mouth at bedtime. 02/17/15   York Spaniel, MD  enalapril (VASOTEC) 2.5 MG tablet Take 1 tablet (2.5 mg total) by mouth daily. 02/05/15   Jodelle Gross, NP  levothyroxine (SYNTHROID, LEVOTHROID) 100 MCG tablet Take 100 mcg by mouth daily.      Historical Provider, MD  metFORMIN (GLUCOPHAGE) 1000 MG tablet Take 1,000 mg by mouth 2 (two) times daily with a meal.    Historical Provider, MD  metolazone (ZAROXOLYN) 2.5 MG tablet Take 1 tablet (2.5 mg total) by mouth daily. 02/26/15   Abelino Derrick, PA-C  metoprolol tartrate (LOPRESSOR) 25 MG tablet Take 12.5 mg by mouth daily.    Historical Provider, MD  morphine (MSIR) 30 MG tablet Take 30 mg by mouth 2 (two) times daily.     Historical Provider, MD  nitroGLYCERIN (NITROSTAT) 0.4 MG SL tablet Place 1 tablet (0.4 mg total) under the tongue every 5 (five) minutes as needed for chest pain. 02/05/15   Jodelle Gross, NP  ondansetron (ZOFRAN) 4 MG tablet Take 4 mg by mouth every 6 (six) hours as needed for nausea or vomiting.    Historical Provider, MD  pantoprazole (PROTONIX) 40 MG tablet Take 40 mg by mouth 2 (two) times daily.    Historical Provider, MD  pantoprazole (PROTONIX) 40 MG tablet TAKE ONE TABLET TWICE DAILY BEFORE MEALS 03/15/15   Len Blalock, NP  polyethylene glycol (MIRALAX / GLYCOLAX) packet Take 17 g by mouth daily as needed for mild constipation.    Historical Provider, MD  Potassium Chloride Crys CR (KLOR-CON M20 PO) Take 20 mEq by mouth 2 (two) times daily.     Historical Provider, MD  torsemide (DEMADEX) 20 MG tablet Take 1 tablet (20 mg total) by mouth 2 (two) times daily. 02/26/15   Abelino Derrick, PA-C  traZODone (DESYREL) 100 MG tablet Take 2 tablets (200 mg total) by mouth at bedtime. As directed.  If not effective may increase to 300mg  at night. 03/18/15   York Spaniel, MD   Physical Exam: Filed Vitals:   03/22/15 1500  03/22/15 1545 03/22/15 1600 03/22/15 1701  BP: 82/56 88/50 96/42  96/40  Pulse: 90 83 80 77  Temp:      TempSrc:      Resp: 21 16 18 14   Height:      Weight:      SpO2: 99% 100% 98% 99%    Wt Readings from Last 3 Encounters:  03/22/15 71.668 kg (158 lb)  02/26/15 71.668 kg (158 lb)  02/22/15 63.504 kg (140 lb)    General:  Appears somewhat clinically dehydrated. She  is not in clinical shock. Eyes: PERRL, normal lids, irises & conjunctiva ENT: grossly normal hearing, lips & tongue Neck: no LAD, masses or thyromegaly Cardiovascular: She appears to be in sinus rhythm at the present time. No clinical evidence of heart failure. Telemetry: SR, no arrhythmias  Respiratory: CTA bilaterally, no w/r/r. Normal respiratory effort. Abdomen: soft, ntnd Skin: no rash or induration seen on limited exam Musculoskeletal: grossly normal tone BUE/BLE Psychiatric: grossly normal mood and affect, speech fluent and appropriate Neurologic: grossly non-focal.          Labs on Admission:  Basic Metabolic Panel:  Recent Labs Lab 03/22/15 1516  NA 138  K 4.7  CL 99*  CO2 32  GLUCOSE 122*  BUN 35*  CREATININE 1.77*  CALCIUM 7.3*   Liver Function Tests:  Recent Labs Lab 03/22/15 1516  AST 13*  ALT 8*  ALKPHOS 88  BILITOT 0.5  PROT 5.8*  ALBUMIN 3.0*   No results for input(s): LIPASE, AMYLASE in the last 168 hours. No results for input(s): AMMONIA in the last 168 hours. CBC:  Recent Labs Lab 03/22/15 1516  WBC 12.6*  NEUTROABS 10.1*  HGB 9.2*  HCT 28.5*  MCV 102.5*  PLT 168   Cardiac Enzymes:  Recent Labs Lab 03/22/15 1516  TROPONINI <0.03    BNP (last 3 results)  Recent Labs  01/05/15 0432 03/22/15 1516  BNP 1473.0* 121.0*    ProBNP (last 3 results) No results for input(s): PROBNP in the last 8760 hours.  CBG: No results for input(s): GLUCAP in the last 168 hours.  Radiological Exams on Admission: Dg Chest 2 View  03/22/2015   CLINICAL DATA:  Chest  pain and shortness of breath. Nausea and hypotension. Bibasilar opacities on portable chest radiograph from earlier today.  EXAM: CHEST  2 VIEW  COMPARISON:  03/22/2015 chest radiograph.  FINDINGS: Stable cardiomediastinal silhouette with mild cardiomegaly and minimally tortuous thoracic aorta. No pneumothorax. No pleural effusion. No pulmonary edema. There is mild patchy opacity in the posterior basilar right lower lobe. Mild degenerative changes in the thoracic spine.  IMPRESSION: 1. Mild patchy posterior basilar right lower lobe opacity, which could represent a mild pneumonia. Recommend follow-up PA and lateral chest radiographs 6-8 weeks post treatment. 2. Stable mild cardiomegaly. No pulmonary edema. No pleural effusion.   Electronically Signed   By: Delbert Phenix M.D.   On: 03/22/2015 17:04   US Venous Img Lower Bilateral  03/22/2015   CLINICAL DATA:  Chronic lower extremity edema  EXAM: BILATERAL LOWER EXTREMITY VENOUS DUPLEX ULTRASOUND  TECHNIQUE: Doppler venous assessment of the bilateral lower extremity deep venous system was performed, including characterization of spectral flow, compressibility, and phasicity.  COMPARISON:  None.  FINDINGS: There is complete compressibility of the bilateral common femoral, femoral, and popliteal veins. Doppler analysis demonstrates respiratory phasicity and augmentation of flow upon calf compression. No evidence of calf vein DVT. Subcutaneous edema in the calves is noted.  IMPRESSION: No evidence of lower extremity DVT.   Electronically Signed   By: Jolaine Click M.D.   On: 03/22/2015 12:34   Dg Chest Port 1 View  03/22/2015   CLINICAL DATA:  Chest pain and shortness of breath for last 3 days.  EXAM: PORTABLE CHEST - 1 VIEW  COMPARISON:  02/22/2015  FINDINGS: Cardiomediastinal silhouette is stably enlarged. Mediastinal contours appear intact.  There is no evidence of pleural effusion or pneumothorax. Right greater than left lower lobe airspace opacities may be due to  low lung volumes.  Osseous structures are without acute abnormality. Soft tissues are grossly normal.  IMPRESSION: Questionable right greater than left lower lobes airspace opacities, which may be attributed to portable technique and low lung volumes. Follow-up with PA and lateral radiographs of the chest may be considered if found clinically necessary.   Electronically Signed   By: Ted Mcalpine M.D.   On: 03/22/2015 15:34    EKG: Independently reviewed. Sinus rhythm without any acute ST-T wave changes.  Assessment/Plan   1. Chest pain. Difficult to ascertain whether this is cardiac in origin or not. It could be related to atrial fibrillation/flutter. Cycle cardiac enzymes. Echocardiogram has been done already which does not show any hypokinesis and shows normal ejection fraction. I will check d-dimer. If this is positive she will need further evaluation for PE. Appreciate cardiology consultation. 2. Hypotension. This is likely hypovolemia. Hold ACE inhibitor and beta blocker for the time being and initiate IV fluids. Monitor renal function closely. 3. Chronic kidney disease. Monitor renal function closely. 4. Hypertension. Patient is currently hypotensive. 5. Macrocytic anemia. Check anemia panel. Her macrocytosis is likely related to COPD. 6. COPD, appears to be stable. There is no clinical indication of pneumonia, despite chest x-ray findings. She does not really give a history of pneumonia. I will not initiate antibiotics for the time being.  Further recommendations will depend on patient's hospital progress.   Code Status: Full code.   DVT Prophylaxis: Lovenox.  Family Communication: I discussed the plan with the patient at the bedside.   Disposition Plan: Home when medically stable.  Time spent: 60 minutes.  Wilson Singer [facility] Pager [phone].

## 2015-03-22 NOTE — ED Notes (Signed)
Patient states chest pain started Friday. Was seen in office this morning for procedure to check for clots in her legs. PA sent her here via ems due to hypotension, chest pain, nausea and shortness of breath.

## 2015-03-22 NOTE — ED Provider Notes (Signed)
CSN: [id number]     Arrival date & time 03/22/15  1451 History   First MD Initiated Contact with Patient 03/22/15 1453     Chief Complaint  Patient presents with  . Chest Pain  . Hypotension     (Consider location/radiation/quality/duration/timing/severity/associated sxs/prior Treatment) HPI Comments: Patient presents to the ER for evaluation of chest pain. Patient reports that she started having pain 4 days ago. Pain has been on and off. She was seen by her primary doctor this morning and was sent to the ER for further evaluation. She has had nausea, shortness of breath associated with the chest pain. EMS reports that the patient has been hypotensive throughout transport. She has been given IV fluid bolus without improvement.  Patient is a 69 y.o. female presenting with chest pain.  Chest Pain Associated symptoms: nausea     Past Medical History  Diagnosis Date  . Type 2 diabetes mellitus   . Arthritis   . COPD (chronic obstructive pulmonary disease)     Home oxygen  . Palpitations     Reports history of atrial fibrillation, although none seen recently  . Coronary atherosclerosis of native coronary artery     Nonobstructive at catheterization 2008  . GERD (gastroesophageal reflux disease)   . Irritable bowel syndrome   . Hypothyroidism   . Hyperlipidemia   . Chronic edema   . Pneumonia   . Essential hypertension   . Chronic pain   . Anemia of chronic disease   . Bulging lumbar disc     3  . Headache(784.0)   . Sprain of neck   . Asthma   . Visual loss, left eye   . CKD (chronic kidney disease), stage II   . Claustrophobia   . Temporal arteritis   . Memory difficulty   . Myocardial infarction   . Chronic insomnia 02/17/2015   Past Surgical History  Procedure Laterality Date  . Cholecystectomy    . Appendectomy    . Foot surgery    . Abdominal hysterectomy    . Cataract extraction w/phaco Left 09/01/2013    Procedure: CATARACT EXTRACTION PHACO AND INTRAOCULAR LENS  PLACEMENT (IOC);  Surgeon: Gemma Payor, MD;  Location: [facility];  Service: Ophthalmology;  Laterality: Left;  CDE:  14.13  . Artery biopsy Left 09/22/2013    Procedure: BIOPSY TEMPORAL ARTERY;  Surgeon: Mariella Saa, MD;  Location: [facility];  Service: General;  Laterality: Left;   Family History  Problem Relation Age of Onset  . Hypertension    . Diabetes Brother   . Migraines Brother    Social History  Substance Use Topics  . Smoking status: Former Smoker -- 1.00 packs/day for 48 years    Types: Cigarettes    Start date: 05/26/1959    Quit date: 12/28/1995  . Smokeless tobacco: Never Used  . Alcohol Use: No   OB History    Gravida Para Term Preterm AB TAB SAB Ectopic Multiple Living   1 1 1             Review of Systems  Cardiovascular: Positive for chest pain.  Gastrointestinal: Positive for nausea.  All other systems reviewed and are negative.     Allergies  Codeine  Home Medications   Prior to Admission medications   Medication Sig Start Date End Date Taking? Authorizing Provider  acetaminophen (TYLENOL) 500 MG tablet Take 1,000 mg by mouth every 8 (eight) hours.    Yes Historical Provider, MD  ALPRAZolam Prudy Feeler) 1  MG tablet Take 1 mg by mouth 3 (three) times daily.    Yes Historical Provider, MD  aspirin EC 81 MG tablet Take 81 mg by mouth every morning.    Yes Historical Provider, MD  atorvastatin (LIPITOR) 80 MG tablet Take 1 tablet (80 mg total) by mouth daily. 02/05/15  Yes Jodelle Gross, NP  clopidogrel (PLAVIX) 75 MG tablet Take 1 tablet (75 mg total) by mouth daily. 02/05/15  Yes Jodelle Gross, NP  dexamethasone (DECADRON) 0.5 MG tablet Take 1 tablet (0.5 mg total) by mouth daily. Patient taking differently: Take 0.5 mg by mouth every other day.  02/17/15  Yes York Spaniel, MD  docusate sodium (COLACE) 100 MG capsule Take 2 capsules (200 mg total) by mouth daily. 07/13/14  Yes Malissa Hippo, MD  donepezil (ARICEPT) 5 MG tablet Take 1 tablet (5 mg  total) by mouth at bedtime. 02/17/15  Yes York Spaniel, MD  enalapril (VASOTEC) 2.5 MG tablet Take 1 tablet (2.5 mg total) by mouth daily. 02/05/15  Yes Jodelle Gross, NP  levothyroxine (SYNTHROID, LEVOTHROID) 100 MCG tablet Take 100 mcg by mouth daily.     Yes Historical Provider, MD  metFORMIN (GLUCOPHAGE) 1000 MG tablet Take 1,000 mg by mouth 2 (two) times daily with a meal.   Yes Historical Provider, MD  metoprolol tartrate (LOPRESSOR) 25 MG tablet Take 12.5 mg by mouth daily.   Yes Historical Provider, MD  morphine (MSIR) 30 MG tablet Take 30 mg by mouth 2 (two) times daily.    Yes Historical Provider, MD  nitroGLYCERIN (NITROSTAT) 0.4 MG SL tablet Place 1 tablet (0.4 mg total) under the tongue every 5 (five) minutes as needed for chest pain. 02/05/15  Yes Jodelle Gross, NP  ondansetron (ZOFRAN) 4 MG tablet Take 4 mg by mouth every 6 (six) hours as needed for nausea or vomiting.   Yes Historical Provider, MD  pantoprazole (PROTONIX) 40 MG tablet TAKE ONE TABLET TWICE DAILY BEFORE MEALS 03/15/15  Yes Len Blalock, NP  polyethylene glycol (MIRALAX / GLYCOLAX) packet Take 17 g by mouth daily as needed for mild constipation.   Yes Historical Provider, MD  potassium chloride SA (K-DUR,KLOR-CON) 20 MEQ tablet Take 20 mEq by mouth 2 (two) times daily.   Yes Historical Provider, MD  torsemide (DEMADEX) 20 MG tablet Take 1 tablet (20 mg total) by mouth 2 (two) times daily. 02/26/15  Yes Abelino Derrick, PA-C  traZODone (DESYREL) 100 MG tablet Take 2 tablets (200 mg total) by mouth at bedtime. As directed.  If not effective may increase to 300mg  at night. 03/18/15  Yes York Spaniel, MD   BP 81/37 mmHg  Pulse 77  Temp(Src) 98.2 F (36.8 C) (Oral)  Resp 18  Ht 5\' 6"  (1.676 m)  Wt 158 lb (71.668 kg)  BMI 25.51 kg/m2  SpO2 100% Physical Exam  Constitutional: She is oriented to person, place, and time. She appears well-developed and well-nourished. No distress.  HENT:  Head: Normocephalic  and atraumatic.  Right Ear: Hearing normal.  Left Ear: Hearing normal.  Nose: Nose normal.  Mouth/Throat: Oropharynx is clear and moist and mucous membranes are normal.  Eyes: Conjunctivae and EOM are normal. Pupils are equal, round, and reactive to light.  Neck: Normal range of motion. Neck supple.  Cardiovascular: Regular rhythm, S1 normal and S2 normal.  Exam reveals no gallop and no friction rub.   No murmur heard. Pulmonary/Chest: Effort normal and breath sounds normal.  No respiratory distress. She exhibits no tenderness.  Abdominal: Soft. Normal appearance and bowel sounds are normal. There is no hepatosplenomegaly. There is no tenderness. There is no rebound, no guarding, no tenderness at McBurney's point and negative Murphy's sign. No hernia.  Musculoskeletal: Normal range of motion.  Neurological: She is alert and oriented to person, place, and time. She has normal strength. No cranial nerve deficit or sensory deficit. Coordination normal. GCS eye subscore is 4. GCS verbal subscore is 5. GCS motor subscore is 6.  Skin: Skin is warm, dry and intact. No rash noted. No cyanosis.  Psychiatric: She has a normal mood and affect. Her speech is normal and behavior is normal. Thought content normal.  Nursing note and vitals reviewed.   ED Course  Procedures (including critical care time) Labs Review Labs Reviewed  CBC WITH DIFFERENTIAL/PLATELET - Abnormal; Notable for the following:    WBC 12.6 (*)    RBC 2.78 (*)    Hemoglobin 9.2 (*)    HCT 28.5 (*)    MCV 102.5 (*)    Neutrophils Relative % 80 (*)    Neutro Abs 10.1 (*)    Lymphocytes Relative 10 (*)    Monocytes Absolute 1.2 (*)    All other components within normal limits  COMPREHENSIVE METABOLIC PANEL - Abnormal; Notable for the following:    Chloride 99 (*)    Glucose, Bld 122 (*)    BUN 35 (*)    Creatinine, Ser 1.77 (*)    Calcium 7.3 (*)    Total Protein 5.8 (*)    Albumin 3.0 (*)    AST 13 (*)    ALT 8 (*)    GFR  calc non Af Amer 28 (*)    GFR calc Af Amer 33 (*)    All other components within normal limits  BRAIN NATRIURETIC PEPTIDE - Abnormal; Notable for the following:    B Natriuretic Peptide 121.0 (*)    All other components within normal limits  RETICULOCYTES - Abnormal; Notable for the following:    Retic Ct Pct 4.5 (*)    RBC. 2.70 (*)    All other components within normal limits  D-DIMER, QUANTITATIVE (NOT AT [facility]) - Abnormal; Notable for the following:    D-Dimer, Quant 0.82 (*)    All other components within normal limits  TROPONIN I  URINALYSIS, ROUTINE W REFLEX MICROSCOPIC (NOT AT [facility])  TSH  TROPONIN I  FOLATE  TROPONIN I  TROPONIN I  COMPREHENSIVE METABOLIC PANEL  CBC  VITAMIN B12  IRON AND TIBC  FERRITIN  I-STAT CG4 LACTIC ACID, ED    Imaging Review Dg Chest 2 View  03/22/2015   CLINICAL DATA:  Chest pain and shortness of breath. Nausea and hypotension. Bibasilar opacities on portable chest radiograph from earlier today.  EXAM: CHEST  2 VIEW  COMPARISON:  03/22/2015 chest radiograph.  FINDINGS: Stable cardiomediastinal silhouette with mild cardiomegaly and minimally tortuous thoracic aorta. No pneumothorax. No pleural effusion. No pulmonary edema. There is mild patchy opacity in the posterior basilar right lower lobe. Mild degenerative changes in the thoracic spine.  IMPRESSION: 1. Mild patchy posterior basilar right lower lobe opacity, which could represent a mild pneumonia. Recommend follow-up PA and lateral chest radiographs 6-8 weeks post treatment. 2. Stable mild cardiomegaly. No pulmonary edema. No pleural effusion.   Electronically Signed   By: Delbert Phenix M.D.   On: 03/22/2015 17:04   US Venous Img Lower Bilateral  03/22/2015   CLINICAL DATA:  Chronic lower extremity edema  EXAM: BILATERAL LOWER EXTREMITY VENOUS DUPLEX ULTRASOUND  TECHNIQUE: Doppler venous assessment of the bilateral lower extremity deep venous system was performed, including characterization of  spectral flow, compressibility, and phasicity.  COMPARISON:  None.  FINDINGS: There is complete compressibility of the bilateral common femoral, femoral, and popliteal veins. Doppler analysis demonstrates respiratory phasicity and augmentation of flow upon calf compression. No evidence of calf vein DVT. Subcutaneous edema in the calves is noted.  IMPRESSION: No evidence of lower extremity DVT.   Electronically Signed   By: Jolaine Click M.D.   On: 03/22/2015 12:34   Dg Chest Port 1 View  03/22/2015   CLINICAL DATA:  Chest pain and shortness of breath for last 3 days.  EXAM: PORTABLE CHEST - 1 VIEW  COMPARISON:  02/22/2015  FINDINGS: Cardiomediastinal silhouette is stably enlarged. Mediastinal contours appear intact.  There is no evidence of pleural effusion or pneumothorax. Right greater than left lower lobe airspace opacities may be due to low lung volumes.  Osseous structures are without acute abnormality. Soft tissues are grossly normal.  IMPRESSION: Questionable right greater than left lower lobes airspace opacities, which may be attributed to portable technique and low lung volumes. Follow-up with PA and lateral radiographs of the chest may be considered if found clinically necessary.   Electronically Signed   By: Ted Mcalpine M.D.   On: 03/22/2015 15:34   I have personally reviewed and evaluated these images and lab results as part of my medical decision-making.   EKG Interpretation   Date/Time:  Monday March 22 2015 14:54:28 EDT Ventricular Rate:  104 PR Interval:  173 QRS Duration: 81 QT Interval:  355 QTC Calculation: 467 R Axis:   76 Text Interpretation:  Sinus tachycardia Nonspecific ST and T wave  abnormality No significant change since last tracing Confirmed by POLLINA   MD, CHRISTOPHER [phone]) on 03/22/2015 4:17:12 PM      MDM   Final diagnoses:  Chest pain, unspecified chest pain type    Patient brought to emergency department for evaluation of hypotension. Patient  has been expressing chest pain for several days. She saw her doctor and was found to be hypotensive, sent to the ER for further evaluation. Upon examination in the ER, she is hypotensive but largely asymptomatic. Patient to minister to IV fluids without much improvement of her blood pressure. Initial troponin was negative. Patient seen by cardiology, recommended hospitalization for further management.    Gilda Crease, MD 03/22/15 2206

## 2015-03-22 NOTE — Consult Note (Signed)
Primary cardiologist: Consulting cardiologist:  Clinical Summary Kara [name] is a 69 y.o.female history of COPD, DM2, PAF, opiate withdrawal, CKD, anemia of chronic disease,  NSTEMI 12/2014 at [facility] in the setting of pneumonia and COPD exacerbation admitted with palpitatations, chest pain and hypotension. Regarding NSTEMI Jun 2016 at [facility] peak trop 5 according to notes in setting of pneumona, respiratory failure, hypovolemic shock, anemia, and afib with RVR. Discussions were had for cath but patient declined.    She is a poor historian. She reports several weeks of intermittent chest pain, sharp in nature in midchest. Typically associated with palpitations. She also endorses poor oral intake over the last several days due to nausea.    04/2014 echo: LVEF 50%, grade I diastolic dysfunction Jan 2015 MPI: inferoseptal scar with no ischemia 04/2011 cath: no significant CAD Clinic EKG aflutter rate 150, ST/T changes likely secondary to flutter waves ER EKG: sinus tach LE [place]: no DVT CXR questionable right>left airspace opacity  Cr 1.77 (baeline 1), BUN 35, K 4.7, trop neg x1, WBC 12.6, Hgb 9.3, Plt 168, lactic acid 1.6   Allergies  Allergen Reactions  . Codeine Nausea And Vomiting and Rash    Medications Scheduled Medications:    Infusions:    PRN Medications:     Past Medical History  Diagnosis Date  . Type 2 diabetes mellitus   . Arthritis   . COPD (chronic obstructive pulmonary disease)     Home oxygen  . Palpitations     Reports history of atrial fibrillation, although none seen recently  . Coronary atherosclerosis of native coronary artery     Nonobstructive at catheterization 2008  . GERD (gastroesophageal reflux disease)   . Irritable bowel syndrome   . Hypothyroidism   . Hyperlipidemia   . Chronic edema   . Pneumonia   . Essential hypertension   . Chronic pain   . Anemia of chronic disease   . Bulging lumbar disc     3  . Headache(784.0)     . Sprain of neck   . Asthma   . Visual loss, left eye   . CKD (chronic kidney disease), stage II   . Claustrophobia   . Temporal arteritis   . Memory difficulty   . Myocardial infarction   . Chronic insomnia 02/17/2015    Past Surgical History  Procedure Laterality Date  . Cholecystectomy    . Appendectomy    . Foot surgery    . Abdominal hysterectomy    . Cataract extraction w/phaco Left 09/01/2013    Procedure: CATARACT EXTRACTION PHACO AND INTRAOCULAR LENS PLACEMENT (IOC);  Surgeon: Gemma Payor, MD;  Location: [facility];  Service: Ophthalmology;  Laterality: Left;  CDE:  14.13  . Artery biopsy Left 09/22/2013    Procedure: BIOPSY TEMPORAL ARTERY;  Surgeon: Mariella Saa, MD;  Location: [facility] OR;  Service: General;  Laterality: Left;    Family History  Problem Relation Age of Onset  . Hypertension    . Diabetes Brother   . Migraines Brother     Social History Kara [name] reports that she quit smoking about 19 years ago. Her smoking use included Cigarettes. She started smoking about 55 years ago. She has a 48 pack-year smoking history. She has never used smokeless tobacco. Kara [name] reports that she does not drink alcohol.  Review of Systems CONSTITUTIONAL: No weight loss, fever, chills, weakness or fatigue.  HEENT: Eyes: No visual loss, blurred vision, double vision or yellow sclerae. No  hearing loss, sneezing, congestion, runny nose or sore throat.  SKIN: No rash or itching.  CARDIOVASCULAR: per HPI  RESPIRATORY: No shortness of breath, cough or sputum.  GASTROINTESTINAL: nausea GENITOURINARY: no polyuria, no dysuria NEUROLOGICAL: No headache, dizziness, syncope, paralysis, ataxia, numbness or tingling in the extremities. No change in bowel or bladder control.  MUSCULOSKELETAL: No muscle, back pain, joint pain or stiffness.  HEMATOLOGIC: No anemia, bleeding or bruising.  LYMPHATICS: No enlarged nodes. No history of splenectomy.  PSYCHIATRIC: No history of depression or  anxiety.      Physical Examination Blood pressure 86/50, pulse 88, temperature 98.7 F (37.1 C), temperature source Oral, resp. rate 12, height 5\' 6"  (1.676 m), weight 158 lb (71.668 kg), SpO2 100 %. No intake or output data in the 24 hours ending 03/22/15 1526  HEENT: sclera clear  Cardiovascular: RRR, no m/r/g, no jvd  Respiratory: CTAB  GI: abdomen soft, NT, ND  MSK: trace bilateral edema. Left chest wall tender to palpation.   Neuro: no focal deficits  Psych: appropriate affect   Lab Results  Basic Metabolic Panel: No results for input(s): NA, K, CL, CO2, GLUCOSE, BUN, CREATININE, CALCIUM, MG, PHOS in the last 168 hours.  Liver Function Tests: No results for input(s): AST, ALT, ALKPHOS, BILITOT, PROT, ALBUMIN in the last 168 hours.  CBC: No results for input(s): WBC, NEUTROABS, HGB, HCT, MCV, PLT in the last 168 hours.  Cardiac Enzymes: No results for input(s): CKTOTAL, CKMB, CKMBINDEX, TROPONINI in the last 168 hours.  BNP: Invalid input(s): POCBNP     Imaging 12/2014 Echo [facility] PROCEDURE  Study Quality: Technically difficult. A injection of Optison contrast agent   was performed to improve image quality. Contrast approved by: Dr. Kirby Crigler.   O2 Saturation is 94.  -  SUMMARY  There is mild concentric left ventricular hypertrophy.  The left ventricular size is normal.    Left ventricular systolic function is mildly reduced.  LV ejection fraction = 45-50%.  There is basal LV inferior wall hypokinesis  There is mid LV inferior wall hypokinesis  Unable to fully assess LV regional wall motion  The right ventricle is normal in size and function.  There is mild aortic regurgitation.  There is mild to moderate mitral regurgitation.  There is trivial pericardial effusion.  Mildly dilated ascending aorta.  There is no comparison study available.  -  FINDINGS:    LEFT VENTRICLE  There is mild concentric left ventricular  hypertrophy. The left ventricular   size is normal. Left ventricular systolic function is mildly reduced. LV   ejection fraction = 45-50%. Left ventricular filling pattern is   indeterminate. There is basal LV inferior wall hypokinesis. There is mid LV   inferior wall hypokinesis. Unable to fully assess LV regional wall motion.  -    RIGHT VENTRICLE  The right ventricle is normal in size and function.    LEFT ATRIUM  The left atrium is borderline dilated.    RIGHT ATRIUM    Right atrium not well visualized.  -  AORTIC VALVE  There is aortic valve sclerosis. There is mild aortic regurgitation.  -  MITRAL VALVE  There is mild mitral annular calcification. There is mild to moderate mitral   regurgitation.  -  TRICUSPID VALVE  Structurally normal tricuspid valve. There is trace tricuspid regurgitation.   Adequate jet of tricuspid regurgitation was not detected, so RSVP (right   ventricular systolic pressure) could not be estimated.  -  PULMONIC VALVE  The pulmonic valve is not well visualized.  -  ARTERIES  The aortic root is normal size. Mildly dilated ascending aorta.  -  VENOUS  Pulmonary venous flow pattern not well visualized. IVC size was mildly   dilated.  -  EFFUSION  There is trivial pericardial effusion.  -  -  MMode/2D Measurements & Calculations  IVSd: 1.3 cm  LVIDd: 3.9 cm  LVPWd: 1.3 cm  LVIDs: 3.0 cm  LA dim: 3.6 cm  Ao root: 3.3 cm  EDV(MOD-sp4): 91.7 ml  ESV(MOD-sp4): 39.4 ml  LVOT diam: 2.0 cm  LVOT area: 3.1 cm2  SV(MOD-sp4): 52.3 ml  SI(MOD-sp4): 27.9 ml/m2  LA area A4: 20.7 cm2  LA length (vol): 5.6 cm  Doppler Measurements & Calculations  SV(LVOT): 31.0 ml  Ao max PG: 4.1 mmHg  Ao mean PG: 2.0 mmHg  Ao V2 VTI: 14.5 cm  AVA (VTI): 2.1 cm2  LV V1 VTI: 10.0 cm  AS Dimensionless Index (VTI): 0.69  AVAi(VTI) cm^2/m^2: 1.1 cm2  SV index(LVOT): 16.6 ml/m2      Reading  Physician:  [place], [id number] 01/09/2015 01:32 PM      Impression/Recommendations 1. Aflutter/Afib - patient with history of afib, has not been on anticoag for unclear reasons. In cards clinic EKG with aflutter rates 150s, ekg in ER NSR low 100s without ischemic changes.  - heart rate currently normal, if reoccurence of afib/aflutter with RVR would start amiodarone given her low bp's. Hold home metoprolol in setting of hypotension at this time.  - will hold on anticoag for afib until further history reviewed, unclear why not on at home  2. Hypotension - echo shows normal LV systolic function, no significant RV dysfunction. Does not appear to be cardiac. Possible hypovolemic given her history of poor oral intake with AKI vs infectious given her elevated WBC.  - starting to trend up with IVFs.   3. Chest pain - difficult to gather precise history. Symptoms seem to correspond with palpitations. Potentially symptomatic afib/aflutter. Her left sided pain currently is reproducible to palpation, possible MSK component.  - initial EKG in setting of aflutter with ST/T changes likely related to flutter, now back in NSR no ischemic changes. Troponin negative. Continue to cycle enzymes overnight, currently no evidence of ACS - history of NSTEMI at [facility] 12/2014 in setting of severe systemic illness, patient medically managed, refused cath at that time. - with chest pain, aflutter with RVR and hypotension would evaluate for PE. Check D-dimer, if positive consider CT PE vs VQ pending on her renal function  4. Anemia - hx of anemia of chronic disease - management per primary team.   5. AKI - per primary team, she endorses recent poor oral intake due to nausea, potentially prerenal. Hold home diuretics.        Dina Rich, M.D.

## 2015-03-22 NOTE — Telephone Encounter (Signed)
Patient brought to office by husband, presented in wheelchair complaining of chest pain and palpitations stating that she was having a "heart attack."  Nursing was instructed to bring her back to a room and obtain an ECG as well as call EMS. I broke away from clinic to evaluate the patient. She was not very clear in describing onset and quality of symptoms, seemed to indicate a feeling of intermittent palpitations for at least the last few weeks and also recurring "sharp" chest discomfort.  I reviewed her history, last saw her in the office in March. She has a history of nonobstructive CAD by cardiac catheterization in 2008 and also paroxysmal atrial fibrillation. Cardiolite study in January 2015 showed LVEF 33% with evidence of inferoseptal scar but no active ischemia. Echocardiogram done at the same time showed LVEF approximately 40%, some views suggesting better LVEF.  Interval history is very difficult to sort out despite using Care Everywhere. It looks like she was seen at both [facility] and potentially by the [facility] practice between June and July. There is report of NSTEMI in June, LVEF 45-50% with inferior hypokinesis by echocardiography. It sounds like she had acute on chronic systolic heart failure and possibly recurrent atrial fibrillation as well. I cannot tell whether she had a cardiac catheterization or not. She could not explain to me what occurred during these hospitalizations.  On assessment she complained of sharp chest discomfort and also feeling of rapid heart rate. ECG obtained showing probable atrial flutter with 2:1 block and diffuse nonspecific ST segment changes, ischemia not excluded. Heart rate was in the 150s. On examination she did have times when heart rate would decrease into the 100 range suggesting that the rhythm was not persistent.  EMS arrived transport patient to [facility] for further evaluation. I spoke by phone with ER physician Dr. Judd Lien to alert him of  the patient's pending arrival. Our cardiology consultation service can see her for further evaluation as well. At this point it is not entirely clear to me whether this represents an ACS with resulting arrhythmia, or more of a problem with recurrent symptomatic atrial arrhythmias in general. Of note, she has been on aspirin and Plavix, but not anticoagulation.  Jonelle Sidle, M.D., F.A.C.C.

## 2015-03-22 NOTE — Telephone Encounter (Signed)
Pt walked in c/o chest pain "heart pounding" BP 91/60 HR 137, performed EKG

## 2015-03-23 ENCOUNTER — Encounter (HOSPITAL_COMMUNITY): Payer: Self-pay

## 2015-03-23 ENCOUNTER — Inpatient Hospital Stay (HOSPITAL_COMMUNITY): Payer: Medicare HMO

## 2015-03-23 LAB — GLUCOSE, CAPILLARY
GLUCOSE-CAPILLARY: 90 mg/dL (ref 65–99)
GLUCOSE-CAPILLARY: 154 mg/dL — AB (ref 65–99)
Glucose-Capillary: 155 mg/dL — ABNORMAL HIGH (ref 65–99)
Glucose-Capillary: 161 mg/dL — ABNORMAL HIGH (ref 65–99)

## 2015-03-23 LAB — COMPREHENSIVE METABOLIC PANEL
ALBUMIN: 2.7 g/dL — AB (ref 3.5–5.0)
ALT: 6 U/L — ABNORMAL LOW (ref 14–54)
ANION GAP: 7 (ref 5–15)
AST: 11 U/L — ABNORMAL LOW (ref 15–41)
Alkaline Phosphatase: 93 U/L (ref 38–126)
BILIRUBIN TOTAL: 0.4 mg/dL (ref 0.3–1.2)
BUN: 35 mg/dL — ABNORMAL HIGH (ref 6–20)
CO2: 32 mmol/L (ref 22–32)
Calcium: 7.3 mg/dL — ABNORMAL LOW (ref 8.9–10.3)
Chloride: 104 mmol/L (ref 101–111)
Creatinine, Ser: 1.34 mg/dL — ABNORMAL HIGH (ref 0.44–1.00)
GFR calc Af Amer: 46 mL/min — ABNORMAL LOW (ref >60)
GFR, EST NON AFRICAN AMERICAN: 40 mL/min — AB (ref >60)
GLUCOSE: 122 mg/dL — AB (ref 65–99)
POTASSIUM: 4.1 mmol/L (ref 3.5–5.1)
Sodium: 143 mmol/L (ref 135–145)
TOTAL PROTEIN: 5.5 g/dL — AB (ref 6.5–8.1)

## 2015-03-23 LAB — CBC
HEMATOCRIT: 27.8 % — AB (ref 36.0–46.0)
HEMOGLOBIN: 8.9 g/dL — AB (ref 12.0–15.0)
MCH: 33 pg (ref 26.0–34.0)
MCHC: 32 g/dL (ref 30.0–36.0)
MCV: 103 fL — ABNORMAL HIGH (ref 78.0–100.0)
Platelets: 145 10*3/uL — ABNORMAL LOW (ref 150–400)
RBC: 2.7 MIL/uL — AB (ref 3.87–5.11)
RDW: 15 % (ref 11.5–15.5)
WBC: 6.7 10*3/uL (ref 4.0–10.5)

## 2015-03-23 LAB — TROPONIN I: Troponin I: <0.03 ng/mL (ref <0.031)

## 2015-03-23 MED ORDER — TECHNETIUM TC 99M DIETHYLENETRIAME-PENTAACETIC ACID
40.0000 | Freq: Once | INTRAVENOUS | Status: DC | PRN
  Administered 2015-03-23: 42 via RESPIRATORY_TRACT
  Filled 2015-03-23: qty 40

## 2015-03-23 MED ORDER — TECHNETIUM TO 99M ALBUMIN AGGREGATED
6.0000 | Freq: Once | INTRAVENOUS | Status: AC | PRN
  Administered 2015-03-23: 6.2 via INTRAVENOUS

## 2015-03-23 MED ORDER — ENOXAPARIN SODIUM 40 MG/0.4ML ~~LOC~~ SOLN
40.0000 mg | SUBCUTANEOUS | Status: DC
  Administered 2015-03-23: 40 mg via SUBCUTANEOUS
  Filled 2015-03-23: qty 0.4

## 2015-03-23 MED ORDER — AMIODARONE HCL 200 MG PO TABS
200.0000 mg | ORAL_TABLET | Freq: Two times a day (BID) | ORAL | Status: DC
  Administered 2015-03-23 – 2015-03-24 (×3): 200 mg via ORAL
  Filled 2015-03-23 (×3): qty 1

## 2015-03-23 NOTE — Progress Notes (Signed)
Subjective:    No chest pain this AM  Objective:   Temp:  [97.7 F (36.5 C)-98.7 F (37.1 C)] 97.7 F (36.5 C) (08/30 0507) Pulse Rate:  [71-90] 71 (08/30 0507) Resp:  [12-21] 20 (08/30 0507) BP: (81-114)/(37-56) 103/42 mmHg (08/30 0507) SpO2:  [98 %-100 %] 100 % (08/30 0507) Weight:  [158 lb (71.668 kg)] 158 lb (71.668 kg) (08/29 1457) Last BM Date: 03/20/15  Filed Weights   03/22/15 1457  Weight: 158 lb (71.668 kg)    Intake/Output Summary (Last 24 hours) at 03/23/15 0832 Last data filed at 03/23/15 0658  Gross per 24 hour  Intake    895 ml  Output      0 ml  Net    895 ml    Telemetry: NSR  Exam:  General: NAD  Resp: CTAB  Cardiac: RRR, 2/6 systolic murmur RUSB, no JVD  GI: abdomen soft, NT, ND  MSK: trace bilateral edema  Neuro: no focal deficits  Psych: appropriate affect  Lab Results:  Basic Metabolic Panel:  Recent Labs Lab 03/22/15 1516 03/23/15 0523  NA 138 143  K 4.7 4.1  CL 99* 104  CO2 32 32  GLUCOSE 122* 122*  BUN 35* 35*  CREATININE 1.77* 1.34*  CALCIUM 7.3* 7.3*    Liver Function Tests:  Recent Labs Lab 03/22/15 1516 03/23/15 0523  AST 13* 11*  ALT 8* 6*  ALKPHOS 88 93  BILITOT 0.5 0.4  PROT 5.8* 5.5*  ALBUMIN 3.0* 2.7*    CBC:  Recent Labs Lab 03/22/15 1516 03/23/15 0523  WBC 12.6* 6.7  HGB 9.2* 8.9*  HCT 28.5* 27.8*  MCV 102.5* 103.0*  PLT 168 145*    Cardiac Enzymes:  Recent Labs Lab 03/22/15 1738 03/22/15 2252 03/23/15 0523  TROPONINI <0.03 <0.03 <0.03    BNP: No results for input(s): PROBNP in the last 8760 hours.  Coagulation: No results for input(s): INR in the last 168 hours.  ECG:   Medications:   Scheduled Medications: . acetaminophen  1,000 mg Oral 3 times per day  . ALPRAZolam  1 mg Oral TID  . aspirin EC  81 mg Oral Daily  . atorvastatin  80 mg Oral Daily  . clopidogrel  75 mg Oral Daily  . dexamethasone  0.5 mg Oral Daily  . docusate sodium  200 mg Oral Daily  .  donepezil  5 mg Oral QHS  . enoxaparin (LOVENOX) injection  30 mg Subcutaneous Q24H  . levothyroxine  100 mcg Oral QAC breakfast  . morphine  30 mg Oral BID  . pantoprazole  40 mg Oral BID  . potassium chloride SA  20 mEq Oral BID  . sodium chloride  3 mL Intravenous Q12H  . torsemide  20 mg Oral BID  . traZODone  200 mg Oral QHS     Infusions: . sodium chloride 75 mL/hr at 03/22/15 1902     PRN Medications:  nitroGLYCERIN, ondansetron **OR** ondansetron (ZOFRAN) IV, ondansetron, polyethylene glycol     Assessment/Plan   1. Aflutter/Afib - patient with history of afib, has not been on anticoag for unclear reasons. In cards clinic EKG with aflutter rates 150s, ekg in ER NSR low 100s without ischemic changes.  - heart rate currently normal, if reoccurence of afib/aflutter with RVR would start amiodarone given her low bp's. Hold home metoprolol in setting of hypotension at this time.  - will hold on anticoag due to anemia, defer long term decision to her primary  cardiologist   2. Hypotension - echo shows normal LV systolic function, no significant RV dysfunction. Does not appear to be cardiac. Possible hypovolemic given her history of poor oral intake with AKI vs infectious given her elevated WBC.  - starting to trend up with IVFs.   3. Chest pain - difficult to gather precise history. Symptoms seem to correspond with palpitations. Potentially symptomatic afib/aflutter. Her left sided pain currently is reproducible to palpation, possible MSK component.  - initial EKG in setting of aflutter with ST/T changes likely related to flutter, now back in NSR no ischemic changes. Troponin negative x 4  - history of NSTEMI at [facility] 12/2014 in setting of severe systemic illness, patient medically managed, refused cath at that time. - + D-dimer, she has VQ scan scheduled - do not see indication for ischemic testing at this time, especially since she recently refused cath  4.  Anemia - hx of anemia of chronic disease - management per primary team.   5. AKI - per primary team, she endorses recent poor oral intake due to nausea, potentially prerenal. Hold home diuretics.  - Cr trending down with IVFs, would contiue - hold torsemide in setting of AKI and hypovolemia.         Dina Rich, M.D.

## 2015-03-23 NOTE — Progress Notes (Signed)
. Subjective: The patient is alert this a.m. she has a history of intermittent chest pain associated with palpitations and additional medical problems chronic kidney disease diabetes mellitus and paroxysmal atrial fibrillation flutter. She does have a low hemoglobin 8.9  Objective: Vital signs in last 24 hours: Temp:  [97.7 F (36.5 C)-98.7 F (37.1 C)] 97.7 F (36.5 C) (08/30 0507) Pulse Rate:  [71-90] 71 (08/30 0507) Resp:  [12-21] 20 (08/30 0507) BP: (81-114)/(37-56) 103/42 mmHg (08/30 0507) SpO2:  [98 %-100 %] 100 % (08/30 0507) Weight:  [71.668 kg (158 lb)] 71.668 kg (158 lb) (08/29 1457) Weight change:  Last BM Date: 03/20/15  Intake/Output from previous day:   Intake/Output this shift:    Physical Exam: Gen. appearance the patient is alert and oriented  HEENT negative except for decreased vision in right eye  Neck supple no JVD or thyroid abnormalities  Heart sinus tachycardia no murmurs  Lungs clear to P&A  Abdomen no palpable organs or masses  Extremities free of edema   Recent Labs  03/22/15 1516 03/23/15 0523  WBC 12.6* 6.7  HGB 9.2* 8.9*  HCT 28.5* 27.8*  PLT 168 145*   BMET  Recent Labs  03/22/15 1516  NA 138  K 4.7  CL 99*  CO2 32  GLUCOSE 122*  BUN 35*  CREATININE 1.77*  CALCIUM 7.3*    Studies/Results: Dg Chest 2 View  03/22/2015   CLINICAL DATA:  Chest pain and shortness of breath. Nausea and hypotension. Bibasilar opacities on portable chest radiograph from earlier today.  EXAM: CHEST  2 VIEW  COMPARISON:  03/22/2015 chest radiograph.  FINDINGS: Stable cardiomediastinal silhouette with mild cardiomegaly and minimally tortuous thoracic aorta. No pneumothorax. No pleural effusion. No pulmonary edema. There is mild patchy opacity in the posterior basilar right lower lobe. Mild degenerative changes in the thoracic spine.  IMPRESSION: 1. Mild patchy posterior basilar right lower lobe opacity, which could represent a mild pneumonia.  Recommend follow-up PA and lateral chest radiographs 6-8 weeks post treatment. 2. Stable mild cardiomegaly. No pulmonary edema. No pleural effusion.   Electronically Signed   By: Delbert Phenix M.D.   On: 03/22/2015 17:04   US Venous Img Lower Bilateral  03/22/2015   CLINICAL DATA:  Chronic lower extremity edema  EXAM: BILATERAL LOWER EXTREMITY VENOUS DUPLEX ULTRASOUND  TECHNIQUE: Doppler venous assessment of the bilateral lower extremity deep venous system was performed, including characterization of spectral flow, compressibility, and phasicity.  COMPARISON:  None.  FINDINGS: There is complete compressibility of the bilateral common femoral, femoral, and popliteal veins. Doppler analysis demonstrates respiratory phasicity and augmentation of flow upon calf compression. No evidence of calf vein DVT. Subcutaneous edema in the calves is noted.  IMPRESSION: No evidence of lower extremity DVT.   Electronically Signed   By: Jolaine Click M.D.   On: 03/22/2015 12:34   Dg Chest Port 1 View  03/22/2015   CLINICAL DATA:  Chest pain and shortness of breath for last 3 days.  EXAM: PORTABLE CHEST - 1 VIEW  COMPARISON:  02/22/2015  FINDINGS: Cardiomediastinal silhouette is stably enlarged. Mediastinal contours appear intact.  There is no evidence of pleural effusion or pneumothorax. Right greater than left lower lobe airspace opacities may be due to low lung volumes.  Osseous structures are without acute abnormality. Soft tissues are grossly normal.  IMPRESSION: Questionable right greater than left lower lobes airspace opacities, which may be attributed to portable technique and low lung volumes. Follow-up with PA and lateral radiographs  of the chest may be considered if found clinically necessary.   Electronically Signed   By: Ted Mcalpine M.D.   On: 03/22/2015 15:34    Medications:  . acetaminophen  1,000 mg Oral 3 times per day  . ALPRAZolam  1 mg Oral TID  . aspirin EC  81 mg Oral Daily  . atorvastatin  80  mg Oral Daily  . clopidogrel  75 mg Oral Daily  . dexamethasone  0.5 mg Oral Daily  . docusate sodium  200 mg Oral Daily  . donepezil  5 mg Oral QHS  . enoxaparin (LOVENOX) injection  30 mg Subcutaneous Q24H  . levothyroxine  100 mcg Oral QAC breakfast  . morphine  30 mg Oral BID  . pantoprazole  40 mg Oral BID  . potassium chloride SA  20 mEq Oral BID  . sodium chloride  3 mL Intravenous Q12H  . torsemide  20 mg Oral BID  . traZODone  200 mg Oral QHS    . sodium chloride 75 mL/hr at 03/22/15 1902     Assessment/Plan: 1. Patient was admitted with chest pain she does have atrial fibrillation and flutter plan to cycle enzymes obtain cardiology consult  2. Macrocytic anemia with hemoglobin 8.9. We'll continue workup and evaluation with  3. Hypotension on admission currently more normal blood pressure  4. 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 Chronic kidney disease to continue to monitor   LOS: 1 day   Queen Abbett G 03/23/2015, 6:39 AM

## 2015-03-23 NOTE — Care Management Note (Signed)
Case Management Note  Patient Details  Name: JOANA NOLTON MRN: [id number] Date of Birth: [birth year]  Expected Discharge Date:    03/23/2015               Expected Discharge Plan:  Home/Self Care  In-House Referral:  NA  Discharge planning Services  CM Consult  Post Acute Care Choice:  NA Choice offered to:  NA  DME Arranged:    DME Agency:     HH Arranged:    HH Agency:     Status of Service:  Completed, signed off  Medicare Important Message Given:    Date Medicare IM Given:    Medicare IM give by:    Date Additional Medicare IM Given:    Additional Medicare Important Message give by:     If discussed at Long Length of Stay Meetings, dates discussed:    Additional Comments: Pt is from home, lives with her "friend" and is independent at baseline. Pt admitted with chest pain and hypotension. Pt uses a cane and walker for ambulation. Pt has home O2 and neb from [place]. Pt plans to return home with self care at DC. No further CM needs noted.  Malcolm Metro, RN 03/23/2015, 10:18 AM

## 2015-03-24 LAB — CBC WITH DIFFERENTIAL/PLATELET
Basophils Absolute: 0 10*3/uL (ref 0.0–0.1)
Basophils Relative: 0 % (ref 0–1)
EOS PCT: 3 % (ref 0–5)
Eosinophils Absolute: 0.2 10*3/uL (ref 0.0–0.7)
HCT: 26.8 % — ABNORMAL LOW (ref 36.0–46.0)
Hemoglobin: 8.5 g/dL — ABNORMAL LOW (ref 12.0–15.0)
LYMPHS ABS: 1.1 10*3/uL (ref 0.7–4.0)
LYMPHS PCT: 22 % (ref 12–46)
MCH: 32.7 pg (ref 26.0–34.0)
MCHC: 31.7 g/dL (ref 30.0–36.0)
MCV: 103.1 fL — AB (ref 78.0–100.0)
MONOS PCT: 9 % (ref 3–12)
Monocytes Absolute: 0.5 10*3/uL (ref 0.1–1.0)
Neutro Abs: 3.2 10*3/uL (ref 1.7–7.7)
Neutrophils Relative %: 66 % (ref 43–77)
PLATELETS: 141 10*3/uL — AB (ref 150–400)
RBC: 2.6 MIL/uL — AB (ref 3.87–5.11)
RDW: 14.8 % (ref 11.5–15.5)
WBC: 4.9 10*3/uL (ref 4.0–10.5)

## 2015-03-24 LAB — BASIC METABOLIC PANEL
Anion gap: 5 (ref 5–15)
BUN: 23 mg/dL — AB (ref 6–20)
CHLORIDE: 109 mmol/L (ref 101–111)
CO2: 31 mmol/L (ref 22–32)
Calcium: 7.9 mg/dL — ABNORMAL LOW (ref 8.9–10.3)
Creatinine, Ser: 1.05 mg/dL — ABNORMAL HIGH (ref 0.44–1.00)
GFR calc Af Amer: >60 mL/min (ref >60)
GFR, EST NON AFRICAN AMERICAN: 53 mL/min — AB (ref >60)
GLUCOSE: 116 mg/dL — AB (ref 65–99)
POTASSIUM: 4.5 mmol/L (ref 3.5–5.1)
Sodium: 145 mmol/L (ref 135–145)

## 2015-03-24 LAB — GLUCOSE, CAPILLARY
Glucose-Capillary: 87 mg/dL (ref 65–99)
Glucose-Capillary: 147 mg/dL — ABNORMAL HIGH (ref 65–99)

## 2015-03-24 MED ORDER — AMIODARONE HCL 200 MG PO TABS: 200.0000 mg | ORAL_TABLET | Freq: Two times a day (BID) | ORAL | Status: DC

## 2015-03-24 MED ORDER — TORSEMIDE 20 MG PO TABS
20.0000 mg | ORAL_TABLET | Freq: Every day | ORAL | Status: DC
Start: 2015-03-25 — End: 2015-03-24

## 2015-03-24 NOTE — Progress Notes (Signed)
Kara [name] to be D/C'd Home per MD order.  Discussed prescriptions and follow up appointments with the patient. Prescriptions given to patient, medication list explained in detail. Pt verbalized understanding.    Medication List    TAKE these medications        acetaminophen 500 MG tablet  Commonly known as:  TYLENOL  Take 1,000 mg by mouth every 8 (eight) hours.     ALPRAZolam 1 MG tablet  Commonly known as:  XANAX  Take 1 mg by mouth 3 (three) times daily.     amiodarone 200 MG tablet  Commonly known as:  PACERONE  Take 1 tablet (200 mg total) by mouth 2 (two) times daily.     aspirin EC 81 MG tablet  Take 81 mg by mouth every morning.     atorvastatin 80 MG tablet  Commonly known as:  LIPITOR  Take 1 tablet (80 mg total) by mouth daily.     clopidogrel 75 MG tablet  Commonly known as:  PLAVIX  Take 1 tablet (75 mg total) by mouth daily.     dexamethasone 0.5 MG tablet  Commonly known as:  DECADRON  Take 1 tablet (0.5 mg total) by mouth daily.     docusate sodium 100 MG capsule  Commonly known as:  COLACE  Take 2 capsules (200 mg total) by mouth daily.     donepezil 5 MG tablet  Commonly known as:  ARICEPT  Take 1 tablet (5 mg total) by mouth at bedtime.     enalapril 2.5 MG tablet  Commonly known as:  VASOTEC  Take 1 tablet (2.5 mg total) by mouth daily.     levothyroxine 100 MCG tablet  Commonly known as:  SYNTHROID, LEVOTHROID  Take 100 mcg by mouth daily.     metFORMIN 1000 MG tablet  Commonly known as:  GLUCOPHAGE  Take 1,000 mg by mouth 2 (two) times daily with a meal.     metoprolol tartrate 25 MG tablet  Commonly known as:  LOPRESSOR  Take 12.5 mg by mouth daily.     morphine 30 MG tablet  Commonly known as:  MSIR  Take 30 mg by mouth 2 (two) times daily.     nitroGLYCERIN 0.4 MG SL tablet  Commonly known as:  NITROSTAT  Place 1 tablet (0.4 mg total) under the tongue every 5 (five) minutes as needed for chest pain.     ondansetron 4 MG  tablet  Commonly known as:  ZOFRAN  Take 4 mg by mouth every 6 (six) hours as needed for nausea or vomiting.     pantoprazole 40 MG tablet  Commonly known as:  PROTONIX  TAKE ONE TABLET TWICE DAILY BEFORE MEALS     polyethylene glycol packet  Commonly known as:  MIRALAX / GLYCOLAX  Take 17 g by mouth daily as needed for mild constipation.     potassium chloride SA 20 MEQ tablet  Commonly known as:  K-DUR,KLOR-CON  Take 20 mEq by mouth 2 (two) times daily.     torsemide 20 MG tablet  Commonly known as:  DEMADEX  Take 1 tablet (20 mg total) by mouth 2 (two) times daily.     traZODone 100 MG tablet  Commonly known as:  DESYREL  Take 2 tablets (200 mg total) by mouth at bedtime. As directed.  If not effective may increase to 300mg  at night.        Filed Vitals:   03/24/15 0537  BP: 110/41  Pulse: 88  Temp: 98.7 F (37.1 C)  Resp: 20    Skin clean, dry and intact without evidence of skin break down, no evidence of skin tears noted. IV catheter discontinued intact. Site without signs and symptoms of complications. Dressing and pressure applied. Pt denies pain at this time. No complaints noted.  An After Visit Summary was printed and given to the patient. Patient also given education material on chest pain. Patient escorted via WC, and D/C home via private auto.  Janeann Forehand BSN, RN

## 2015-03-24 NOTE — Progress Notes (Signed)
Subjective: The patient had a fairly good night. She has not had chest pain she has been placed on amiodarone to help control her arrhythmia and seems to be in sinus rhythm now  Objective: Vital signs in last 24 hours: Temp:  [97.7 F (36.5 C)-98.7 F (37.1 C)] 98.7 F (37.1 C) (08/31 0537) Pulse Rate:  [79-88] 88 (08/31 0537) Resp:  [20] 20 (08/31 0537) BP: (110-142)/(37-47) 110/41 mmHg (08/31 0537) SpO2:  [100 %] 100 % (08/31 0537) Weight change:  Last BM Date: 03/20/15  Intake/Output from previous day: 08/30 0701 - 08/31 0700 In: 2271.3 [P.O.:480; I.V.:1791.3] Out: 1400 [Urine:1400] Intake/Output this shift:    Physical Exam: Gen. appearance patient is alert and oriented  HEENT negative except for decreased vision in right eye  Neck supple no JVD or thyroid abnormalities  Heart sinus tachycardia no murmurs  Lungs clear to P&A  Abdomen no palpable organs or masses  Extremities free of edema   Recent Labs  03/22/15 1516 03/23/15 0523  WBC 12.6* 6.7  HGB 9.2* 8.9*  HCT 28.5* 27.8*  PLT 168 145*   BMET  Recent Labs  03/22/15 1516 03/23/15 0523  NA 138 143  K 4.7 4.1  CL 99* 104  CO2 32 32  GLUCOSE 122* 122*  BUN 35* 35*  CREATININE 1.77* 1.34*  CALCIUM 7.3* 7.3*    Studies/Results: Dg Chest 2 View  03/22/2015   CLINICAL DATA:  Chest pain and shortness of breath. Nausea and hypotension. Bibasilar opacities on portable chest radiograph from earlier today.  EXAM: CHEST  2 VIEW  COMPARISON:  03/22/2015 chest radiograph.  FINDINGS: Stable cardiomediastinal silhouette with mild cardiomegaly and minimally tortuous thoracic aorta. No pneumothorax. No pleural effusion. No pulmonary edema. There is mild patchy opacity in the posterior basilar right lower lobe. Mild degenerative changes in the thoracic spine.  IMPRESSION: 1. Mild patchy posterior basilar right lower lobe opacity, which could represent a mild pneumonia. Recommend follow-up PA and lateral chest  radiographs 6-8 weeks post treatment. 2. Stable mild cardiomegaly. No pulmonary edema. No pleural effusion.   Electronically Signed   By: Delbert Phenix M.D.   On: 03/22/2015 17:04   Nm Pulmonary Perf And Vent  03/23/2015   CLINICAL DATA:  Chest pain for 2 weeks, shortness of Breath  EXAM: NUCLEAR MEDICINE VENTILATION - PERFUSION LUNG SCAN  TECHNIQUE: Ventilation images were obtained in multiple projections using inhaled aerosol Tc-1m DTPA. Perfusion images were obtained in multiple projections after intravenous injection of Tc-27m MAA.  RADIOPHARMACEUTICALS:  42 mCi Technetium-17m DTPA aerosol inhalation and 6.2 mCi Technetium-35m MAA IV  COMPARISON:  Chest x-ray 03/22/2015  FINDINGS: Ventilation: Patchy bilateral ventilation defects.  Perfusion: Patchy bilateral nonsegmental perfusion defects, less pronounced than the ventilation defects. No wedge-shaped peripheral defects.  IMPRESSION: Bilateral ventilation and perfusion nonsegmental defects, more pronounced on the ventilation portion of the study. Findings most likely reflective of obstructive lung disease. Findings low probability for pulmonary embolus.   Electronically Signed   By: Charlett Nose M.D.   On: 03/23/2015 12:44   US Venous Img Lower Bilateral  03/22/2015   CLINICAL DATA:  Chronic lower extremity edema  EXAM: BILATERAL LOWER EXTREMITY VENOUS DUPLEX ULTRASOUND  TECHNIQUE: Doppler venous assessment of the bilateral lower extremity deep venous system was performed, including characterization of spectral flow, compressibility, and phasicity.  COMPARISON:  None.  FINDINGS: There is complete compressibility of the bilateral common femoral, femoral, and popliteal veins. Doppler analysis demonstrates respiratory phasicity and augmentation of flow upon calf  compression. No evidence of calf vein DVT. Subcutaneous edema in the calves is noted.  IMPRESSION: No evidence of lower extremity DVT.   Electronically Signed   By: Jolaine Click M.D.   On: 03/22/2015  12:34   Dg Chest Port 1 View  03/22/2015   CLINICAL DATA:  Chest pain and shortness of breath for last 3 days.  EXAM: PORTABLE CHEST - 1 VIEW  COMPARISON:  02/22/2015  FINDINGS: Cardiomediastinal silhouette is stably enlarged. Mediastinal contours appear intact.  There is no evidence of pleural effusion or pneumothorax. Right greater than left lower lobe airspace opacities may be due to low lung volumes.  Osseous structures are without acute abnormality. Soft tissues are grossly normal.  IMPRESSION: Questionable right greater than left lower lobes airspace opacities, which may be attributed to portable technique and low lung volumes. Follow-up with PA and lateral radiographs of the chest may be considered if found clinically necessary.   Electronically Signed   By: Ted Mcalpine M.D.   On: 03/22/2015 15:34    Medications:  . acetaminophen  1,000 mg Oral 3 times per day  . ALPRAZolam  1 mg Oral TID  . amiodarone  200 mg Oral BID  . aspirin EC  81 mg Oral Daily  . atorvastatin  80 mg Oral Daily  . clopidogrel  75 mg Oral Daily  . dexamethasone  0.5 mg Oral Daily  . docusate sodium  200 mg Oral Daily  . donepezil  5 mg Oral QHS  . enoxaparin (LOVENOX) injection  40 mg Subcutaneous Q24H  . levothyroxine  100 mcg Oral QAC breakfast  . morphine  30 mg Oral BID  . pantoprazole  40 mg Oral BID  . potassium chloride SA  20 mEq Oral BID  . sodium chloride  3 mL Intravenous Q12H  . traZODone  200 mg Oral QHS    . sodium chloride 75 mL/hr at 03/23/15 0935     Assessment/Plan: 1 atrial field and flutter currently on amiodarone  2. Admitted with hypotension now normotensive  3. History of chest pain troponins have been normal range previous MI  4. Macrocytic anemia will continue to monitor  5. History of temporal arteritis   LOS: 2 days   Crystallynn Noorani G 03/24/2015, 7:14 AM

## 2015-03-24 NOTE — Care Management Note (Signed)
Case Management Note  Patient Details  Name: Kara [name] MRN: [id number] Date of Birth: 12/18/1945  Expected Discharge Date:                  Expected Discharge Plan:  Home/Self Care  In-House Referral:  NA  Discharge planning Services  CM Consult  Post Acute Care Choice:  NA Choice offered to:  NA  DME Arranged:    DME Agency:     HH Arranged:    HH Agency:     Status of Service:  Completed, signed off  Medicare Important Message Given:  N/A - LOS <3 / Initial given by admissions Date Medicare IM Given:    Medicare IM give by:    Date Additional Medicare IM Given:    Additional Medicare Important Message give by:     If discussed at Long Length of Stay Meetings, dates discussed:    Additional Comments: Pt discharging home today. No CM needs.  Malcolm Metro, RN 03/24/2015, 12:12 PM

## 2015-03-24 NOTE — Discharge Summary (Signed)
Physician Discharge Summary  Kara [name] ZOX:[id number] DOB: 1945/12/29 DOA: 03/22/2015  PCP: [name], MD  Admit date: 03/22/2015 Discharge date: 03/24/2015     Discharge Diagnoses:  1. Cardiac arrhythmia atrial flutter atrial fibrillation 2. Atypical chest pain history of coronary artery disease and previous MI 3. Hypotension essential 4. Anemia of chronic disease 5. Acute kidney injury  Discharge Condition: Stable Disposition: Home  Diet recommendation: Heart healthy 2000-calorie ADA diet  Filed Weights   03/22/15 1457  Weight: 71.668 kg (158 lb)    History of present illness:  The patient had been having chest pain for proximal 2 weeks. She does have a prior history coronary artery disease diabetes and paroxysmal atrial fibrillation flutter. She was found to be hypotensive and cardiologist office and was sent to the emergency room  Hospital Course:  The patient was found to be somewhat hypotensive in ED to be due to hypovolemia her ACE inhibitor was held and beta blocker held and she was started on IV fluids. It was recommended cardiac enzymes should be cycle. This was accomplished and they remain normal. X-ray of chest was normal her blood pressure normalized. She is seen in consultation by cardiology and because of intermittent atrial fibrillation and flutter she was started on amiodarone 200 mg by mouth twice a day she remained stable on this regimen. She has had a chronic anemia. Her range was 9.2-8.5. This has been chronic and will be followed as an outpatient patient continued on medications listed below. Her blood sugars stayed in satisfactory range. Since she remains stable and was felt she could be discharged and followed as an outpatient   Discharge Instructions The patient will be continued on medications listed below. As requested that she make appointments with primary care physician and cardiology for follow-up within the next few days    Medication  List    TAKE these medications        acetaminophen 500 MG tablet  Commonly known as:  TYLENOL  Take 1,000 mg by mouth every 8 (eight) hours.     ALPRAZolam 1 MG tablet  Commonly known as:  XANAX  Take 1 mg by mouth 3 (three) times daily.     amiodarone 200 MG tablet  Commonly known as:  PACERONE  Take 1 tablet (200 mg total) by mouth 2 (two) times daily.     aspirin EC 81 MG tablet  Take 81 mg by mouth every morning.     atorvastatin 80 MG tablet  Commonly known as:  LIPITOR  Take 1 tablet (80 mg total) by mouth daily.     clopidogrel 75 MG tablet  Commonly known as:  PLAVIX  Take 1 tablet (75 mg total) by mouth daily.     dexamethasone 0.5 MG tablet  Commonly known as:  DECADRON  Take 1 tablet (0.5 mg total) by mouth daily.     docusate sodium 100 MG capsule  Commonly known as:  COLACE  Take 2 capsules (200 mg total) by mouth daily.     donepezil 5 MG tablet  Commonly known as:  ARICEPT  Take 1 tablet (5 mg total) by mouth at bedtime.     enalapril 2.5 MG tablet  Commonly known as:  VASOTEC  Take 1 tablet (2.5 mg total) by mouth daily.     levothyroxine 100 MCG tablet  Commonly known as:  SYNTHROID, LEVOTHROID  Take 100 mcg by mouth daily.     metFORMIN 1000 MG tablet  Commonly known as:  GLUCOPHAGE  Take 1,000 mg by mouth 2 (two) times daily with a meal.     metoprolol tartrate 25 MG tablet  Commonly known as:  LOPRESSOR  Take 12.5 mg by mouth daily.     morphine 30 MG tablet  Commonly known as:  MSIR  Take 30 mg by mouth 2 (two) times daily.     nitroGLYCERIN 0.4 MG SL tablet  Commonly known as:  NITROSTAT  Place 1 tablet (0.4 mg total) under the tongue every 5 (five) minutes as needed for chest pain.     ondansetron 4 MG tablet  Commonly known as:  ZOFRAN  Take 4 mg by mouth every 6 (six) hours as needed for nausea or vomiting.     pantoprazole 40 MG tablet  Commonly known as:  PROTONIX  TAKE ONE TABLET TWICE DAILY BEFORE MEALS      polyethylene glycol packet  Commonly known as:  MIRALAX / GLYCOLAX  Take 17 g by mouth daily as needed for mild constipation.     potassium chloride SA 20 MEQ tablet  Commonly known as:  K-DUR,KLOR-CON  Take 20 mEq by mouth 2 (two) times daily.     torsemide 20 MG tablet  Commonly known as:  DEMADEX  Take 1 tablet (20 mg total) by mouth 2 (two) times daily.     traZODone 100 MG tablet  Commonly known as:  DESYREL  Take 2 tablets (200 mg total) by mouth at bedtime. As directed.  If not effective may increase to 300mg  at night.       Allergies  Allergen Reactions  . Codeine Nausea And Vomiting and Rash    The results of significant diagnostics from this hospitalization (including imaging, microbiology, ancillary and laboratory) are listed below for reference.    Significant Diagnostic Studies: Dg Chest 2 View  03/22/2015   CLINICAL DATA:  Chest pain and shortness of breath. Nausea and hypotension. Bibasilar opacities on portable chest radiograph from earlier today.  EXAM: CHEST  2 VIEW  COMPARISON:  03/22/2015 chest radiograph.  FINDINGS: Stable cardiomediastinal silhouette with mild cardiomegaly and minimally tortuous thoracic aorta. No pneumothorax. No pleural effusion. No pulmonary edema. There is mild patchy opacity in the posterior basilar right lower lobe. Mild degenerative changes in the thoracic spine.  IMPRESSION: 1. Mild patchy posterior basilar right lower lobe opacity, which could represent a mild pneumonia. Recommend follow-up PA and lateral chest radiographs 6-8 weeks post treatment. 2. Stable mild cardiomegaly. No pulmonary edema. No pleural effusion.   Electronically Signed   By: Delbert Phenix M.D.   On: 03/22/2015 17:04   Nm Pulmonary Perf And Vent  03/23/2015   CLINICAL DATA:  Chest pain for 2 weeks, shortness of Breath  EXAM: NUCLEAR MEDICINE VENTILATION - PERFUSION LUNG SCAN  TECHNIQUE: Ventilation images were obtained in multiple projections using inhaled aerosol  Tc-56m DTPA. Perfusion images were obtained in multiple projections after intravenous injection of Tc-36m MAA.  RADIOPHARMACEUTICALS:  42 mCi Technetium-81m DTPA aerosol inhalation and 6.2 mCi Technetium-86m MAA IV  COMPARISON:  Chest x-ray 03/22/2015  FINDINGS: Ventilation: Patchy bilateral ventilation defects.  Perfusion: Patchy bilateral nonsegmental perfusion defects, less pronounced than the ventilation defects. No wedge-shaped peripheral defects.  IMPRESSION: Bilateral ventilation and perfusion nonsegmental defects, more pronounced on the ventilation portion of the study. Findings most likely reflective of obstructive lung disease. Findings low probability for pulmonary embolus.   Electronically Signed   By: Charlett Nose M.D.   On: 03/23/2015 12:44   US Venous  Img Lower Bilateral  03/22/2015   CLINICAL DATA:  Chronic lower extremity edema  EXAM: BILATERAL LOWER EXTREMITY VENOUS DUPLEX ULTRASOUND  TECHNIQUE: Doppler venous assessment of the bilateral lower extremity deep venous system was performed, including characterization of spectral flow, compressibility, and phasicity.  COMPARISON:  None.  FINDINGS: There is complete compressibility of the bilateral common femoral, femoral, and popliteal veins. Doppler analysis demonstrates respiratory phasicity and augmentation of flow upon calf compression. No evidence of calf vein DVT. Subcutaneous edema in the calves is noted.  IMPRESSION: No evidence of lower extremity DVT.   Electronically Signed   By: Jolaine Click M.D.   On: 03/22/2015 12:34   Dg Chest Port 1 View  03/22/2015   CLINICAL DATA:  Chest pain and shortness of breath for last 3 days.  EXAM: PORTABLE CHEST - 1 VIEW  COMPARISON:  02/22/2015  FINDINGS: Cardiomediastinal silhouette is stably enlarged. Mediastinal contours appear intact.  There is no evidence of pleural effusion or pneumothorax. Right greater than left lower lobe airspace opacities may be due to low lung volumes.  Osseous structures are  without acute abnormality. Soft tissues are grossly normal.  IMPRESSION: Questionable right greater than left lower lobes airspace opacities, which may be attributed to portable technique and low lung volumes. Follow-up with PA and lateral radiographs of the chest may be considered if found clinically necessary.   Electronically Signed   By: Ted Mcalpine M.D.   On: 03/22/2015 15:34    Microbiology: No results found for this or any previous visit (from the past 240 hour(s)).   Labs: Basic Metabolic Panel:  Recent Labs Lab 03/22/15 1516 03/23/15 0523 03/24/15 0801  NA 138 143 145  K 4.7 4.1 4.5  CL 99* 104 109  CO2 32 32 31  GLUCOSE 122* 122* 116*  BUN 35* 35* 23*  CREATININE 1.77* 1.34* 1.05*  CALCIUM 7.3* 7.3* 7.9*   Liver Function Tests:  Recent Labs Lab 03/22/15 1516 03/23/15 0523  AST 13* 11*  ALT 8* 6*  ALKPHOS 88 93  BILITOT 0.5 0.4  PROT 5.8* 5.5*  ALBUMIN 3.0* 2.7*   No results for input(s): LIPASE, AMYLASE in the last 168 hours. No results for input(s): AMMONIA in the last 168 hours. CBC:  Recent Labs Lab 03/22/15 1516 03/23/15 0523 03/24/15 0801  WBC 12.6* 6.7 4.9  NEUTROABS 10.1*  --  3.2  HGB 9.2* 8.9* 8.5*  HCT 28.5* 27.8* 26.8*  MCV 102.5* 103.0* 103.1*  PLT 168 145* 141*   Cardiac Enzymes:  Recent Labs Lab 03/22/15 1516 03/22/15 1738 03/22/15 2252 03/23/15 0523  TROPONINI <0.03 <0.03 <0.03 <0.03   BNP: BNP (last 3 results)  Recent Labs  01/05/15 0432 03/22/15 1516  BNP 1473.0* 121.0*    ProBNP (last 3 results) No results for input(s): PROBNP in the last 8760 hours.  CBG:  Recent Labs Lab 03/22/15 2027 03/23/15 0729 03/23/15 1654 03/23/15 2139 03/24/15 0746  GLUCAP 155* 90 154* 161* 87    Active Problems:   COPD (chronic obstructive pulmonary disease)   Essential hypertension, benign   Anemia of chronic disease   Renal insufficiency   Chest pain   Hypotension   Diabetes   Time coordinating discharge:  45 minutes  Signed:  Butch Penny, MD 03/24/2015, 11:48 AM

## 2015-03-24 NOTE — Care Management Important Message (Signed)
Important Message  Patient Details  Name: Kara [name] MRN: [id number] Date of Birth: 16-Nov-1945   Medicare Important Message Given:  N/A - LOS <3 / Initial given by admissions    Malcolm Metro, RN 03/24/2015, 12:12 PM

## 2015-03-24 NOTE — Progress Notes (Signed)
Patient ID: Kara [name], female   DOB: [birth year], 69 y.o.   MRN: [id number]        Subjective:    No complaints  Objective:   Temp:  [97.7 F (36.5 C)-98.7 F (37.1 C)] 98.7 F (37.1 C) (08/31 0537) Pulse Rate:  [79-88] 88 (08/31 0537) Resp:  [20] 20 (08/31 0537) BP: (110-142)/(37-47) 110/41 mmHg (08/31 0537) SpO2:  [100 %] 100 % (08/31 0537) Last BM Date: 03/20/15  Filed Weights   03/22/15 1457  Weight: 158 lb (71.668 kg)    Intake/Output Summary (Last 24 hours) at 03/24/15 0841 Last data filed at 03/24/15 0651  Gross per 24 hour  Intake 2271.25 ml  Output   1400 ml  Net 871.25 ml    Telemetry: NSR  Exam:  General: NAD  Resp: CTAB  Cardiac: RRR, 2/6 systolic murmur RUSB, no jvd  GI: abdomen soft, NT,ND  MSK:no LE edema  Neuro: no focal deficits   Lab Results:  Basic Metabolic Panel:  Recent Labs Lab 03/22/15 1516 03/23/15 0523 03/24/15 0801  NA 138 143 145  K 4.7 4.1 4.5  CL 99* 104 109  CO2 32 32 31  GLUCOSE 122* 122* 116*  BUN 35* 35* 23*  CREATININE 1.77* 1.34* 1.05*  CALCIUM 7.3* 7.3* 7.9*    Liver Function Tests:  Recent Labs Lab 03/22/15 1516 03/23/15 0523  AST 13* 11*  ALT 8* 6*  ALKPHOS 88 93  BILITOT 0.5 0.4  PROT 5.8* 5.5*  ALBUMIN 3.0* 2.7*    CBC:  Recent Labs Lab 03/22/15 1516 03/23/15 0523 03/24/15 0801  WBC 12.6* 6.7 4.9  HGB 9.2* 8.9* 8.5*  HCT 28.5* 27.8* 26.8*  MCV 102.5* 103.0* 103.1*  PLT 168 145* 141*    Cardiac Enzymes:  Recent Labs Lab 03/22/15 1738 03/22/15 2252 03/23/15 0523  TROPONINI <0.03 <0.03 <0.03    BNP: No results for input(s): PROBNP in the last 8760 hours.  Coagulation: No results for input(s): INR in the last 168 hours.  ECG:   Medications:   Scheduled Medications: . acetaminophen  1,000 mg Oral 3 times per day  . ALPRAZolam  1 mg Oral TID  . amiodarone  200 mg Oral BID  . aspirin EC  81 mg Oral Daily  . atorvastatin  80 mg Oral Daily  . clopidogrel   75 mg Oral Daily  . dexamethasone  0.5 mg Oral Daily  . docusate sodium  200 mg Oral Daily  . donepezil  5 mg Oral QHS  . enoxaparin (LOVENOX) injection  40 mg Subcutaneous Q24H  . levothyroxine  100 mcg Oral QAC breakfast  . morphine  30 mg Oral BID  . pantoprazole  40 mg Oral BID  . potassium chloride SA  20 mEq Oral BID  . sodium chloride  3 mL Intravenous Q12H  . traZODone  200 mg Oral QHS     Infusions: . sodium chloride 75 mL/hr at 03/23/15 0935     PRN Medications:  nitroGLYCERIN, ondansetron **OR** ondansetron (ZOFRAN) IV, ondansetron, polyethylene glycol, technetium TC 15M diethylenetriame-pentaacetic acid     Assessment/Plan    1. Aflutter/Afib - patient with history of afib, has not been on anticoag for unclear reasons. In cards clinic EKG with aflutter rates 150s, ekg in ER NSR low 100s without ischemic changes.  - back in NSR, started on oral amiodarone 200mg  bid. Plan for 3 weeks of amio 200mg  bid (until 04/13/15) then change to 200mg  daily.  - will hold on anticoag  due to anemia, defer long term decision to her primary cardiologist - soft bp's at times, continue to hold off on av nodal agents  2. Hypotension - echo shows normal LV systolic function, no significant RV dysfunction. Does not appear to be cardiac. - improving with IVFs  3. Chest pain - difficult to gather precise history. Symptoms seem to correspond with palpitations. Potentially symptomatic afib/aflutter. Her left sided pain currently is reproducible to palpation, possible MSK component.  - initial EKG in setting of aflutter with ST/T changes likely related to flutter, now back in NSR no ischemic changes. Troponin negative x 4  - history of NSTEMI at [facility] 12/2014 in setting of severe systemic illness, patient medically managed, refused cath at that time. - + D-dimer,low probability VQ scan  4. Anemia - hx of anemia of chronic disease - management per primary team.   5. AKI -  prerenal, Cr trending down with IVFs, would contiue - restart toresmide but only at  once daily    Patient ok for discharge today. WIll need f/u with Dr Diona Browner or NP Lyman Bishop in 1-2 weeks. Would not restart beta blocker at this time due to soft bp's.       Dina Rich, M.D

## 2015-03-30 ENCOUNTER — Ambulatory Visit: Payer: Medicare HMO | Admitting: Cardiology

## 2015-04-01 ENCOUNTER — Ambulatory Visit: Payer: Medicare HMO | Admitting: Cardiology

## 2015-04-05 ENCOUNTER — Telehealth: Payer: Self-pay | Admitting: Neurology

## 2015-04-05 NOTE — Telephone Encounter (Signed)
Kara [name]/Dr Cotter [phone] called, patient called them wanting to be seen because "she is having some pain and her vision seems to be going". Dr. Charise Killian would like for patient to have Sed rate checked by our office. 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

## 2015-04-05 NOTE — Telephone Encounter (Signed)
I called the patient. The patient is having some increased headache, some vision changes. We will get her worked in for an evaluation. Recent sedimentation rate done here 6 weeks ago was 9. We will try to get a revisit.

## 2015-04-06 ENCOUNTER — Telehealth: Payer: Self-pay

## 2015-04-06 ENCOUNTER — Telehealth: Payer: Self-pay | Admitting: Cardiology

## 2015-04-06 NOTE — Telephone Encounter (Signed)
I called the patient and left a voicemail asking her to call back to schedule an appointment with Dr. Anne Hahn. There are two openings today.

## 2015-04-06 NOTE — Telephone Encounter (Signed)
error 

## 2015-04-06 NOTE — Telephone Encounter (Signed)
Kara [name] called requesting test results on her cartoid dopplers

## 2015-04-06 NOTE — Telephone Encounter (Signed)
I called the patient. Appointment scheduled 9/22 at 12 PM (I offered 9/20 at 8 AM but the patient did not want to come in that early). She seems confused about the appointment. She does not remember talking to Dr. Anne Hahn yesterday. I explained that Dr. Charise Killian called our office and that Dr. Anne Hahn called and spoke with her. He wants her to come in because of her headaches and vision changes.

## 2015-04-06 NOTE — Telephone Encounter (Signed)
I'm not sure I understand the request. Both studies are already reported in EPIC with conclusions given. It looks like these were ordered during her recent hospital stay at [facility], and therefore should have already been discussed with her by the admitting team. The lower extremity venous Dopplers did not show evidence of DVT. Echocardiogram reported normal LVEF of 60-65%, indeterminate diastolic function, moderate aortic regurgitation, and mild-to-moderate tricuspid regurgitation. The valvular abnormalities are unlikely to be of any major clinical significance at this particular time.

## 2015-04-06 NOTE — Telephone Encounter (Signed)
Patient informed and verbalized understanding

## 2015-04-06 NOTE — Telephone Encounter (Signed)
Please give official result for this patient's echo and LE doppler that was done on 03/22/15.

## 2015-04-13 ENCOUNTER — Encounter: Payer: Self-pay | Admitting: Cardiology

## 2015-04-13 ENCOUNTER — Ambulatory Visit (INDEPENDENT_AMBULATORY_CARE_PROVIDER_SITE_OTHER): Payer: Medicare HMO | Admitting: Cardiology

## 2015-04-13 VITALS — BP 92/55 | HR 66 | Ht 66.0 in | Wt 158.0 lb

## 2015-04-13 DIAGNOSIS — I10 Essential (primary) hypertension: Secondary | ICD-10-CM

## 2015-04-13 DIAGNOSIS — I4892 Unspecified atrial flutter: Secondary | ICD-10-CM | POA: Diagnosis not present

## 2015-04-13 DIAGNOSIS — R002 Palpitations: Secondary | ICD-10-CM | POA: Diagnosis not present

## 2015-04-13 DIAGNOSIS — I429 Cardiomyopathy, unspecified: Secondary | ICD-10-CM

## 2015-04-13 MED ORDER — AMIODARONE HCL 200 MG PO TABS: 200.0000 mg | ORAL_TABLET | Freq: Every day | ORAL | Status: AC

## 2015-04-13 MED ORDER — AMIODARONE HCL 200 MG PO TABS: 200.0000 mg | ORAL_TABLET | Freq: Every day | ORAL | Status: DC

## 2015-04-13 NOTE — Progress Notes (Signed)
Cardiology Office Note  Date: 04/13/2015   ID: Kara [name], DOB 10-26-1945, MRN [id number]  PCP: [name], MD  Primary Cardiologist: Nona Dell, MD   Chief Complaint  Patient presents with  . Hospitalization Follow-up    History of Present Illness: Kara [name] is a medically complex 69 y.o. female most recently seen as an unscheduled walk-in to the office with chest pain in late August. She was admitted via EMS to [facility] for further evaluation, found to be in 2:1 atrial flutter at that time. Her cardiac history includes poorly documented PAF, nonobstructive CAD, and more recently a medically managed NSTEMI at [facility] in June. She was evaluated by Dr. Wyline Mood while hospitalized recently, spontaneously converted to sinus rhythm with heart rate control, and ruled out for myocardial infarction with normal troponin I levels. She was placed on oral amiodarone load, however not anticoagulated with history of recurring anemia and a hemoglobin down the 7 range as recently as May.  She presents for a follow-up visit today. Frankly, I have a hard time communicating with her and explaining overall treatment plan. She tends to stare at me during the interview, almost in a challenging way, and questions my explanations despite what seems to me to be a very detailed discussion in plain [place]. She complains of intermittent palpitations, however is in sinus rhythm today by follow-up ECG showing nonspecific T-wave changes.   Recent testing includes repeat echocardiogram and also lower extremity venous Dopplers, outlined below. We discussed the results today.  Today I reviewed her medications and I recommended that she stop enalapril altogether given relatively low blood pressures. I also explained that we should reduce her amiodarone to 200 mg once a daily for longer term use. Her CHADSVASC score is 5, but I am quite hesitant to put her on an anticoagulant.   Past Medical  History  Diagnosis Date  . Type 2 diabetes mellitus   . Arthritis   . COPD (chronic obstructive pulmonary disease)     Home oxygen  . Paroxysmal atrial fibrillation   . Coronary atherosclerosis of native coronary artery     Nonobstructive at catheterization 2008  . GERD (gastroesophageal reflux disease)   . Irritable bowel syndrome   . Hypothyroidism   . Hyperlipidemia   . Chronic edema   . Pneumonia   . Essential hypertension   . Chronic pain   . Anemia of chronic disease   . Bulging lumbar disc     3  . Headache(784.0)   . Sprain of neck   . Asthma   . Visual loss, left eye   . CKD (chronic kidney disease), stage II   . Claustrophobia   . Temporal arteritis   . Memory difficulty   . NSTEMI (non-ST elevated myocardial infarction)     June 2016 [facility], managed medically  . Chronic insomnia     Past Surgical History  Procedure Laterality Date  . Cholecystectomy    . Appendectomy    . Foot surgery    . Abdominal hysterectomy    . Cataract extraction w/phaco Left 09/01/2013    Procedure: CATARACT EXTRACTION PHACO AND INTRAOCULAR LENS PLACEMENT (IOC);  Surgeon: Gemma Payor, MD;  Location: [facility] ORS;  Service: Ophthalmology;  Laterality: Left;  CDE:  14.13  . Artery biopsy Left 09/22/2013    Procedure: BIOPSY TEMPORAL ARTERY;  Surgeon: Mariella Saa, MD;  Location: [facility] OR;  Service: General;  Laterality: Left;    Current Outpatient Prescriptions  Medication Sig Dispense Refill  . acetaminophen (TYLENOL) 500 MG tablet Take 1,000 mg by mouth every 8 (eight) hours.     . ALPRAZolam (XANAX) 1 MG tablet Take 1 mg by mouth 3 (three) times daily.     [name] amiodarone (PACERONE) 200 MG tablet Take 1 tablet (200 mg total) by mouth daily. 30 tablet 3  . aspirin EC 81 MG tablet Take 81 mg by mouth every morning.     [name] atorvastatin (LIPITOR) 80 MG tablet Take 1 tablet (80 mg total) by mouth daily. 25 tablet 3  . clopidogrel (PLAVIX) 75 MG tablet Take 1 tablet (75 mg total) by mouth daily.  90 tablet 3  . dexamethasone (DECADRON) 0.5 MG tablet Take 1 tablet (0.5 mg total) by mouth daily. (Patient taking differently: Take 0.5 mg by mouth every other day. ) 30 tablet 3  . docusate sodium (COLACE) 100 MG capsule Take 2 capsules (200 mg total) by mouth daily. 10 capsule 0  . donepezil (ARICEPT) 5 MG tablet Take 1 tablet (5 mg total) by mouth at bedtime. 30 tablet 5  . levothyroxine (SYNTHROID, LEVOTHROID) 100 MCG tablet Take 100 mcg by mouth daily.      . metFORMIN (GLUCOPHAGE) 1000 MG tablet Take 1,000 mg by mouth 2 (two) times daily with a meal.    . metoprolol tartrate (LOPRESSOR) 25 MG tablet Take 12.5 mg by mouth daily.    [name] morphine (MSIR) 30 MG tablet Take 30 mg by mouth 2 (two) times daily.     . nitroGLYCERIN (NITROSTAT) 0.4 MG SL tablet Place 1 tablet (0.4 mg total) under the tongue every 5 (five) minutes as needed for chest pain. 25 tablet 3  . ondansetron (ZOFRAN) 4 MG tablet Take 4 mg by mouth every 6 (six) hours as needed for nausea or vomiting.    . pantoprazole (PROTONIX) 40 MG tablet TAKE ONE TABLET TWICE DAILY BEFORE MEALS 60 tablet 5  . polyethylene glycol (MIRALAX / GLYCOLAX) packet Take 17 g by mouth daily as needed for mild constipation.    . potassium chloride SA (K-DUR,KLOR-CON) 20 MEQ tablet Take 20 mEq by mouth 2 (two) times daily.    [name] torsemide (DEMADEX) 20 MG tablet Take 1 tablet (20 mg total) by mouth 2 (two) times daily. 60 tablet 6  . traZODone (DESYREL) 100 MG tablet Take 2 tablets (200 mg total) by mouth at bedtime. As directed.  If not effective may increase to  at night. 90 tablet 1   No current facility-administered medications for this visit.    Allergies:  Codeine   Social History: The patient  reports that she quit smoking about 19 years ago. Her smoking use included Cigarettes. She started smoking about 55 years ago. She has a 48 pack-year smoking history. She has never used smokeless tobacco. She reports that she does not drink alcohol or  use illicit drugs.   ROS:  Please see the history of present illness. Otherwise, complete review of systems is positive for decreased appetite and intermittent nausea which have been longer-term problems.  All other systems are reviewed and negative.   Physical Exam: VS:  BP 92/55 mmHg  Pulse 66  Ht  (1.676 m)  Wt 158 lb (71.668 kg)  BMI 25.51 kg/m2  SpO2 98%, BMI Body mass index is 25.51 kg/(m^2).  Wt Readings from Last 3 Encounters:  04/13/15 158 lb (71.668 kg)  03/22/15 158 lb (71.668 kg)  02/26/15 158 lb (71.668 kg)  General: Chronically ill-appearing woman in no distress. HEENT: Conjunctiva and lids normal, oropharynx clear with poor dentition. Neck: Supple, no elevated JVP or carotid bruits, no thyromegaly. Lungs: Decreased breath sounds, nonlabored breathing at rest. Cardiac: Regular rate and rhythm, no S3, soft systolic murmur, no pericardial rub. Abdomen: Soft, nontender, bowel sounds present. Extremities: 1+ ankle edema, distal pulses 2+. Skin: Warm and dry. Musculoskeletal: Mild kyphosis. Neuropsychiatric: Alert and oriented x3, affect unusual, possibly affected by dementia or even a personality disorder although I did not explore this in any detail.   ECG: ECG is ordered today.   Recent Labwork: 03/22/2015: B Natriuretic Peptide 121.0*; TSH 1.324 03/23/2015: ALT 6*; AST 11* 03/24/2015: BUN 23*; Creatinine, Ser 1.05*; Hemoglobin 8.5*; Platelets 141*; Potassium 4.5; Sodium 145   Other Studies Reviewed Today:  Echocardiogram 03/22/2015: Study Conclusions  - Left ventricle: The cavity size was normal. Wall thickness was increased in a pattern of mild LVH. Systolic function was normal. The estimated ejection fraction was in the range of 60% to 65%. Wall motion was normal; there were no regional wall motion abnormalities. The study is not technically sufficient to allow evaluation of LV diastolic function. - Aortic valve: Mildly calcified annulus.  Mildly thickened leaflets. There was moderate regurgitation. Valve area (VTI): 2.11 cm^2. Valve area (Vmax): 2.05 cm^2. Valve area (Vmean): 1.92 cm^2. - Mitral valve: Mildly calcified annulus. Mildly thickened leaflets . - Right ventricle: The cavity size was mildly dilated. - Tricuspid valve: There was mild-moderate regurgitation. - This is a limited study to evaluate RV and LV function.  Lower extremity venous Dopplers 03/22/2015: FINDINGS: There is complete compressibility of the bilateral common femoral, femoral, and popliteal veins. Doppler analysis demonstrates respiratory phasicity and augmentation of flow upon calf compression. No evidence of calf vein DVT. Subcutaneous edema in the calves is noted.  IMPRESSION: No evidence of lower extremity DVT.  Assessment and Plan:  1. Poorly documented history of apparent paroxysmal atrial fibrillation, although recently documented atrial flutter that spontaneously converted to sinus rhythm. Patient reports intermittent palpitations which have been long-term. Agree with recent addition of amiodarone for rhythm stabilization, although I am very hesitant to place her on anticoagulant with comorbid illnesses, difficulty communicating with her, and history of severe anemia with hemoglobin down to 7 back in May. ECG today shows sinus rhythm.  2. Prior history of nonobstructive CAD as of 2008. History includes NSTEMI that was medically managed at [facility] in June. She declined cardiac catheterization at that time. At this point she is on anti-platelet regimen and has beta blocker as well as as needed nitroglycerin.  3. Cardiomyopathy reported during workup at [facility], although recent echocardiogram showed LVEF 60-65%.  Current medicines were reviewed with the patient today.   Orders Placed This Encounter  Procedures  . EKG 12-Lead    Disposition: FU with me in 1 month.   Signed, Jonelle Sidle, MD,  [facility] 04/13/2015 5:04 PM    [facility] at [facility] [address] [place], [place], [place] [address] Phone: [phone]; Fax: [phone]

## 2015-04-13 NOTE — Patient Instructions (Signed)
Your physician has recommended you make the following change in your medication:  Stop enalapril. Decrease amiodarone to 200 mg daily. Continue all other medications the same. Your physician recommends that you schedule a follow-up appointment in: 1 month.

## 2015-04-15 ENCOUNTER — Telehealth: Payer: Self-pay

## 2015-04-15 ENCOUNTER — Ambulatory Visit (INDEPENDENT_AMBULATORY_CARE_PROVIDER_SITE_OTHER): Payer: Self-pay | Admitting: Neurology

## 2015-04-15 DIAGNOSIS — R413 Other amnesia: Secondary | ICD-10-CM

## 2015-04-15 NOTE — Telephone Encounter (Signed)
Patient came in 16 minutes late for 12 PM appointment. RN left for lunch at 12:15. Patient was informed staff was at lunch and she said she would wait. When I went to the lobby to get her upon returning from lunch she stated she had to leave to get to another appointment. I explained that she arrived at 16 minutes late and we had gone to lunch. She verbalized understanding. I offered to r/s her appointment while she was here but she declined. She stated she would call to r/s.

## 2015-04-16 ENCOUNTER — Encounter: Payer: Self-pay | Admitting: Neurology

## 2015-04-16 NOTE — Progress Notes (Signed)
The patient came to office, but left prior to being seen. She arrived late for her appointment.

## 2015-04-26 ENCOUNTER — Telehealth: Payer: Self-pay | Admitting: Cardiology

## 2015-04-26 ENCOUNTER — Ambulatory Visit: Payer: Medicare HMO | Admitting: Cardiology

## 2015-04-26 NOTE — Telephone Encounter (Signed)
Pt is having heart palpatations

## 2015-04-26 NOTE — Telephone Encounter (Signed)
Sx's have been going on "too long:" states past 2 months, apt Thursday @ 2:30 pm with Harriet Pho NP

## 2015-04-29 ENCOUNTER — Encounter: Payer: Medicare HMO | Admitting: Adult Health

## 2015-04-29 NOTE — Progress Notes (Signed)
Cardiology Office Note   ERROR No show  

## 2015-05-03 ENCOUNTER — Encounter: Payer: Self-pay | Admitting: Adult Health

## 2015-05-03 ENCOUNTER — Ambulatory Visit (INDEPENDENT_AMBULATORY_CARE_PROVIDER_SITE_OTHER): Payer: Self-pay | Admitting: Adult Health

## 2015-05-03 NOTE — Progress Notes (Signed)
Cardiology Office Note   Date:  05/03/2015   ID:  Kara [name], Kara [name] 09/19/1945, MRN [id number]  PCP:  [name], MD  Cardiologist:   Joni Reining, NP   No chief complaint on file.  NO SHOW

## 2015-05-17 ENCOUNTER — Ambulatory Visit (INDEPENDENT_AMBULATORY_CARE_PROVIDER_SITE_OTHER): Payer: Medicare HMO | Admitting: Internal Medicine

## 2015-05-17 ENCOUNTER — Ambulatory Visit: Payer: Medicare HMO | Admitting: Cardiology

## 2015-05-29 ENCOUNTER — Encounter (HOSPITAL_COMMUNITY): Payer: Self-pay

## 2015-05-29 ENCOUNTER — Inpatient Hospital Stay (HOSPITAL_COMMUNITY)
Admission: EM | Admit: 2015-05-29 | Discharge: 2015-06-24 | DRG: 190 | Disposition: E | Payer: Medicare HMO | Attending: Family Medicine | Admitting: Family Medicine

## 2015-05-29 ENCOUNTER — Emergency Department (HOSPITAL_COMMUNITY): Payer: Medicare HMO

## 2015-05-29 DIAGNOSIS — I5042 Chronic combined systolic (congestive) and diastolic (congestive) heart failure: Secondary | ICD-10-CM | POA: Diagnosis present

## 2015-05-29 DIAGNOSIS — I5181 Takotsubo syndrome: Secondary | ICD-10-CM | POA: Diagnosis present

## 2015-05-29 DIAGNOSIS — F419 Anxiety disorder, unspecified: Secondary | ICD-10-CM | POA: Diagnosis present

## 2015-05-29 DIAGNOSIS — E1122 Type 2 diabetes mellitus with diabetic chronic kidney disease: Secondary | ICD-10-CM | POA: Diagnosis present

## 2015-05-29 DIAGNOSIS — E785 Hyperlipidemia, unspecified: Secondary | ICD-10-CM | POA: Diagnosis present

## 2015-05-29 DIAGNOSIS — F0390 Unspecified dementia without behavioral disturbance: Secondary | ICD-10-CM | POA: Diagnosis present

## 2015-05-29 DIAGNOSIS — N183 Chronic kidney disease, stage 3 unspecified: Secondary | ICD-10-CM | POA: Diagnosis present

## 2015-05-29 DIAGNOSIS — I472 Ventricular tachycardia: Secondary | ICD-10-CM | POA: Diagnosis not present

## 2015-05-29 DIAGNOSIS — J449 Chronic obstructive pulmonary disease, unspecified: Secondary | ICD-10-CM | POA: Diagnosis present

## 2015-05-29 DIAGNOSIS — Z7902 Long term (current) use of antithrombotics/antiplatelets: Secondary | ICD-10-CM

## 2015-05-29 DIAGNOSIS — Z9981 Dependence on supplemental oxygen: Secondary | ICD-10-CM

## 2015-05-29 DIAGNOSIS — Z8249 Family history of ischemic heart disease and other diseases of the circulatory system: Secondary | ICD-10-CM | POA: Diagnosis not present

## 2015-05-29 DIAGNOSIS — I5032 Chronic diastolic (congestive) heart failure: Secondary | ICD-10-CM | POA: Diagnosis not present

## 2015-05-29 DIAGNOSIS — I480 Paroxysmal atrial fibrillation: Secondary | ICD-10-CM | POA: Diagnosis present

## 2015-05-29 DIAGNOSIS — Z87891 Personal history of nicotine dependence: Secondary | ICD-10-CM

## 2015-05-29 DIAGNOSIS — I252 Old myocardial infarction: Secondary | ICD-10-CM | POA: Diagnosis not present

## 2015-05-29 DIAGNOSIS — Z66 Do not resuscitate: Secondary | ICD-10-CM | POA: Diagnosis present

## 2015-05-29 DIAGNOSIS — R531 Weakness: Secondary | ICD-10-CM | POA: Diagnosis present

## 2015-05-29 DIAGNOSIS — I130 Hypertensive heart and chronic kidney disease with heart failure and stage 1 through stage 4 chronic kidney disease, or unspecified chronic kidney disease: Secondary | ICD-10-CM | POA: Diagnosis present

## 2015-05-29 DIAGNOSIS — Z79891 Long term (current) use of opiate analgesic: Secondary | ICD-10-CM | POA: Diagnosis not present

## 2015-05-29 DIAGNOSIS — I776 Arteritis, unspecified: Secondary | ICD-10-CM | POA: Diagnosis present

## 2015-05-29 DIAGNOSIS — I959 Hypotension, unspecified: Secondary | ICD-10-CM | POA: Diagnosis present

## 2015-05-29 DIAGNOSIS — H547 Unspecified visual loss: Secondary | ICD-10-CM | POA: Diagnosis present

## 2015-05-29 DIAGNOSIS — J9611 Chronic respiratory failure with hypoxia: Secondary | ICD-10-CM | POA: Diagnosis present

## 2015-05-29 DIAGNOSIS — Z7982 Long term (current) use of aspirin: Secondary | ICD-10-CM

## 2015-05-29 DIAGNOSIS — J45909 Unspecified asthma, uncomplicated: Secondary | ICD-10-CM | POA: Diagnosis present

## 2015-05-29 DIAGNOSIS — D638 Anemia in other chronic diseases classified elsewhere: Secondary | ICD-10-CM | POA: Diagnosis present

## 2015-05-29 DIAGNOSIS — Z7984 Long term (current) use of oral hypoglycemic drugs: Secondary | ICD-10-CM

## 2015-05-29 DIAGNOSIS — N179 Acute kidney failure, unspecified: Secondary | ICD-10-CM | POA: Diagnosis present

## 2015-05-29 DIAGNOSIS — J440 Chronic obstructive pulmonary disease with acute lower respiratory infection: Secondary | ICD-10-CM | POA: Diagnosis present

## 2015-05-29 DIAGNOSIS — I469 Cardiac arrest, cause unspecified: Secondary | ICD-10-CM | POA: Diagnosis not present

## 2015-05-29 DIAGNOSIS — E119 Type 2 diabetes mellitus without complications: Secondary | ICD-10-CM

## 2015-05-29 DIAGNOSIS — Y95 Nosocomial condition: Secondary | ICD-10-CM | POA: Diagnosis present

## 2015-05-29 DIAGNOSIS — E039 Hypothyroidism, unspecified: Secondary | ICD-10-CM | POA: Diagnosis present

## 2015-05-29 DIAGNOSIS — I2510 Atherosclerotic heart disease of native coronary artery without angina pectoris: Secondary | ICD-10-CM | POA: Diagnosis present

## 2015-05-29 DIAGNOSIS — Z833 Family history of diabetes mellitus: Secondary | ICD-10-CM

## 2015-05-29 DIAGNOSIS — J189 Pneumonia, unspecified organism: Secondary | ICD-10-CM | POA: Diagnosis present

## 2015-05-29 DIAGNOSIS — R0689 Other abnormalities of breathing: Secondary | ICD-10-CM

## 2015-05-29 DIAGNOSIS — M1990 Unspecified osteoarthritis, unspecified site: Secondary | ICD-10-CM | POA: Diagnosis present

## 2015-05-29 DIAGNOSIS — K219 Gastro-esophageal reflux disease without esophagitis: Secondary | ICD-10-CM | POA: Diagnosis present

## 2015-05-29 DIAGNOSIS — D696 Thrombocytopenia, unspecified: Secondary | ICD-10-CM | POA: Diagnosis present

## 2015-05-29 DIAGNOSIS — R7989 Other specified abnormal findings of blood chemistry: Secondary | ICD-10-CM | POA: Diagnosis not present

## 2015-05-29 DIAGNOSIS — I10 Essential (primary) hypertension: Secondary | ICD-10-CM | POA: Diagnosis not present

## 2015-05-29 DIAGNOSIS — J180 Bronchopneumonia, unspecified organism: Secondary | ICD-10-CM | POA: Diagnosis present

## 2015-05-29 DIAGNOSIS — R0600 Dyspnea, unspecified: Secondary | ICD-10-CM | POA: Diagnosis not present

## 2015-05-29 DIAGNOSIS — Z789 Other specified health status: Secondary | ICD-10-CM

## 2015-05-29 DIAGNOSIS — I214 Non-ST elevation (NSTEMI) myocardial infarction: Secondary | ICD-10-CM | POA: Diagnosis not present

## 2015-05-29 LAB — CBC WITH DIFFERENTIAL/PLATELET
BASOS ABS: 0 10*3/uL (ref 0.0–0.1)
Basophils Relative: 0 %
EOS PCT: 1 %
Eosinophils Absolute: 0.1 10*3/uL (ref 0.0–0.7)
HEMATOCRIT: 32.5 % — AB (ref 36.0–46.0)
Hemoglobin: 9.8 g/dL — ABNORMAL LOW (ref 12.0–15.0)
LYMPHS ABS: 1.3 10*3/uL (ref 0.7–4.0)
LYMPHS PCT: 13 %
MCH: 31 pg (ref 26.0–34.0)
MCHC: 30.2 g/dL (ref 30.0–36.0)
MCV: 102.8 fL — AB (ref 78.0–100.0)
MONO ABS: 0.6 10*3/uL (ref 0.1–1.0)
MONOS PCT: 6 %
NEUTROS ABS: 7.7 10*3/uL (ref 1.7–7.7)
Neutrophils Relative %: 80 %
PLATELETS: 90 10*3/uL — AB (ref 150–400)
RBC: 3.16 MIL/uL — ABNORMAL LOW (ref 3.87–5.11)
RDW: 13.8 % (ref 11.5–15.5)
WBC: 9.6 10*3/uL (ref 4.0–10.5)

## 2015-05-29 LAB — COMPREHENSIVE METABOLIC PANEL
ALT: 8 U/L — ABNORMAL LOW (ref 14–54)
AST: 12 U/L — AB (ref 15–41)
Albumin: 2.1 g/dL — ABNORMAL LOW (ref 3.5–5.0)
Alkaline Phosphatase: 73 U/L (ref 38–126)
Anion gap: 7 (ref 5–15)
BILIRUBIN TOTAL: 0.7 mg/dL (ref 0.3–1.2)
BUN: 35 mg/dL — AB (ref 6–20)
CO2: 31 mmol/L (ref 22–32)
Calcium: 7 mg/dL — ABNORMAL LOW (ref 8.9–10.3)
Chloride: 102 mmol/L (ref 101–111)
Creatinine, Ser: 2.24 mg/dL — ABNORMAL HIGH (ref 0.44–1.00)
GFR, EST AFRICAN AMERICAN: 25 mL/min — AB (ref >60)
GFR, EST NON AFRICAN AMERICAN: 21 mL/min — AB (ref >60)
Glucose, Bld: 106 mg/dL — ABNORMAL HIGH (ref 65–99)
POTASSIUM: 5 mmol/L (ref 3.5–5.1)
Sodium: 140 mmol/L (ref 135–145)
TOTAL PROTEIN: 4.7 g/dL — AB (ref 6.5–8.1)

## 2015-05-29 LAB — GLUCOSE, CAPILLARY
GLUCOSE-CAPILLARY: 97 mg/dL (ref 65–99)
GLUCOSE-CAPILLARY: 90 mg/dL (ref 65–99)

## 2015-05-29 LAB — I-STAT CG4 LACTIC ACID, ED: LACTIC ACID, VENOUS: 2.17 mmol/L — AB (ref 0.5–2.0)

## 2015-05-29 LAB — BRAIN NATRIURETIC PEPTIDE: B Natriuretic Peptide: 370 pg/mL — ABNORMAL HIGH (ref 0.0–100.0)

## 2015-05-29 LAB — TROPONIN I

## 2015-05-29 MED ORDER — ALPRAZOLAM 0.5 MG PO TABS
1.0000 mg | ORAL_TABLET | Freq: Once | ORAL | Status: AC
  Administered 2015-05-29: 1 mg via ORAL
  Filled 2015-05-29: qty 2

## 2015-05-29 MED ORDER — POLYETHYLENE GLYCOL 3350 17 G PO PACK: 17.0000 g | PACK | Freq: Every day | ORAL | Status: DC | PRN

## 2015-05-29 MED ORDER — CLOPIDOGREL BISULFATE 75 MG PO TABS
75.0000 mg | ORAL_TABLET | Freq: Every day | ORAL | Status: DC
  Administered 2015-05-29 – 2015-05-31 (×3): 75 mg via ORAL
  Filled 2015-05-29 (×3): qty 1

## 2015-05-29 MED ORDER — LEVOTHYROXINE SODIUM 100 MCG PO TABS
100.0000 ug | ORAL_TABLET | Freq: Every day | ORAL | Status: DC
  Administered 2015-05-29 – 2015-05-31 (×3): 100 ug via ORAL
  Filled 2015-05-29 (×3): qty 1

## 2015-05-29 MED ORDER — SODIUM CHLORIDE 0.9 % IV SOLN
INTRAVENOUS | Status: DC
  Administered 2015-05-30 – 2015-05-31 (×3): via INTRAVENOUS

## 2015-05-29 MED ORDER — DEXAMETHASONE 0.5 MG PO TABS
0.5000 mg | ORAL_TABLET | Freq: Every day | ORAL | Status: DC
  Administered 2015-05-29 – 2015-05-31 (×3): 0.5 mg via ORAL
  Filled 2015-05-29 (×5): qty 1

## 2015-05-29 MED ORDER — MORPHINE SULFATE 15 MG PO TABS
30.0000 mg | ORAL_TABLET | Freq: Two times a day (BID) | ORAL | Status: DC
  Administered 2015-05-29 – 2015-06-01 (×5): 30 mg via ORAL
  Filled 2015-05-29 (×5): qty 2

## 2015-05-29 MED ORDER — TRAZODONE HCL 50 MG PO TABS
200.0000 mg | ORAL_TABLET | Freq: Every day | ORAL | Status: DC
  Administered 2015-05-29 – 2015-06-01 (×3): 200 mg via ORAL
  Filled 2015-05-29 (×3): qty 4

## 2015-05-29 MED ORDER — TORSEMIDE 20 MG PO TABS
20.0000 mg | ORAL_TABLET | Freq: Two times a day (BID) | ORAL | Status: DC
  Administered 2015-05-29 – 2015-05-30 (×4): 20 mg via ORAL
  Filled 2015-05-29 (×4): qty 1

## 2015-05-29 MED ORDER — ATORVASTATIN CALCIUM 40 MG PO TABS
80.0000 mg | ORAL_TABLET | Freq: Every day | ORAL | Status: DC
  Administered 2015-05-29 – 2015-05-31 (×3): 80 mg via ORAL
  Filled 2015-05-29 (×3): qty 2

## 2015-05-29 MED ORDER — PIPERACILLIN-TAZOBACTAM 3.375 G IVPB 30 MIN: 3.3750 g | Freq: Three times a day (TID) | INTRAVENOUS | Status: DC

## 2015-05-29 MED ORDER — ASPIRIN EC 81 MG PO TBEC
81.0000 mg | DELAYED_RELEASE_TABLET | Freq: Every morning | ORAL | Status: DC
  Administered 2015-05-29 – 2015-05-31 (×3): 81 mg via ORAL
  Filled 2015-05-29 (×3): qty 1

## 2015-05-29 MED ORDER — ONDANSETRON HCL 4 MG/2ML IJ SOLN
4.0000 mg | Freq: Four times a day (QID) | INTRAMUSCULAR | Status: DC | PRN
  Administered 2015-05-31: 4 mg via INTRAVENOUS
  Filled 2015-05-29: qty 2

## 2015-05-29 MED ORDER — DONEPEZIL HCL 5 MG PO TABS
5.0000 mg | ORAL_TABLET | Freq: Every day | ORAL | Status: DC
  Administered 2015-05-29 – 2015-06-01 (×3): 5 mg via ORAL
  Filled 2015-05-29 (×3): qty 1

## 2015-05-29 MED ORDER — POTASSIUM CHLORIDE CRYS ER 20 MEQ PO TBCR
20.0000 meq | EXTENDED_RELEASE_TABLET | Freq: Two times a day (BID) | ORAL | Status: DC
  Administered 2015-05-29 – 2015-06-01 (×6): 20 meq via ORAL
  Filled 2015-05-29 (×7): qty 1

## 2015-05-29 MED ORDER — PIPERACILLIN-TAZOBACTAM 3.375 G IVPB
3.3750 g | Freq: Three times a day (TID) | INTRAVENOUS | Status: DC
  Administered 2015-05-29 – 2015-06-01 (×8): 3.375 g via INTRAVENOUS
  Filled 2015-05-29 (×13): qty 50

## 2015-05-29 MED ORDER — AMIODARONE HCL 200 MG PO TABS
200.0000 mg | ORAL_TABLET | Freq: Every day | ORAL | Status: DC
  Administered 2015-05-29 – 2015-05-31 (×3): 200 mg via ORAL
  Filled 2015-05-29 (×3): qty 1

## 2015-05-29 MED ORDER — IPRATROPIUM-ALBUTEROL 0.5-2.5 (3) MG/3ML IN SOLN
3.0000 mL | Freq: Four times a day (QID) | RESPIRATORY_TRACT | Status: DC | PRN
  Administered 2015-05-30 – 2015-05-31 (×2): 3 mL via RESPIRATORY_TRACT
  Filled 2015-05-29 (×2): qty 3

## 2015-05-29 MED ORDER — NITROGLYCERIN 0.4 MG SL SUBL
0.4000 mg | SUBLINGUAL_TABLET | SUBLINGUAL | Status: DC | PRN
  Administered 2015-05-30 (×2): 0.4 mg via SUBLINGUAL
  Filled 2015-05-29: qty 1

## 2015-05-29 MED ORDER — PIPERACILLIN-TAZOBACTAM 3.375 G IVPB 30 MIN
3.3750 g | Freq: Once | INTRAVENOUS | Status: DC
  Filled 2015-05-29: qty 50

## 2015-05-29 MED ORDER — INSULIN ASPART 100 UNIT/ML ~~LOC~~ SOLN: 0.0000 [IU] | Freq: Every day | SUBCUTANEOUS | Status: DC

## 2015-05-29 MED ORDER — IPRATROPIUM-ALBUTEROL 0.5-2.5 (3) MG/3ML IN SOLN
3.0000 mL | Freq: Four times a day (QID) | RESPIRATORY_TRACT | Status: DC
  Filled 2015-05-29: qty 3

## 2015-05-29 MED ORDER — VANCOMYCIN HCL IN DEXTROSE 750-5 MG/150ML-% IV SOLN
750.0000 mg | INTRAVENOUS | Status: DC
  Administered 2015-05-30 – 2015-05-31 (×2): 750 mg via INTRAVENOUS
  Filled 2015-05-29 (×4): qty 150

## 2015-05-29 MED ORDER — ACETAMINOPHEN 325 MG PO TABS
650.0000 mg | ORAL_TABLET | Freq: Four times a day (QID) | ORAL | Status: DC | PRN
  Administered 2015-05-29 – 2015-05-31 (×5): 650 mg via ORAL
  Filled 2015-05-29 (×5): qty 2

## 2015-05-29 MED ORDER — DOCUSATE SODIUM 100 MG PO CAPS
200.0000 mg | ORAL_CAPSULE | Freq: Every day | ORAL | Status: DC
  Administered 2015-05-29 – 2015-05-31 (×3): 200 mg via ORAL
  Filled 2015-05-29 (×3): qty 2

## 2015-05-29 MED ORDER — PANTOPRAZOLE SODIUM 40 MG PO TBEC
40.0000 mg | DELAYED_RELEASE_TABLET | Freq: Two times a day (BID) | ORAL | Status: DC
  Administered 2015-05-29 – 2015-05-31 (×4): 40 mg via ORAL
  Filled 2015-05-29 (×4): qty 1

## 2015-05-29 MED ORDER — VANCOMYCIN HCL IN DEXTROSE 1-5 GM/200ML-% IV SOLN
1000.0000 mg | Freq: Once | INTRAVENOUS | Status: AC
  Administered 2015-05-29: 1000 mg via INTRAVENOUS
  Filled 2015-05-29: qty 200

## 2015-05-29 MED ORDER — ALPRAZOLAM 0.5 MG PO TABS
1.0000 mg | ORAL_TABLET | Freq: Three times a day (TID) | ORAL | Status: DC
  Administered 2015-05-29 – 2015-05-31 (×6): 1 mg via ORAL
  Filled 2015-05-29 (×2): qty 2
  Filled 2015-05-29 (×2): qty 1
  Filled 2015-05-29: qty 2
  Filled 2015-05-29: qty 1

## 2015-05-29 MED ORDER — INSULIN ASPART 100 UNIT/ML ~~LOC~~ SOLN
0.0000 [IU] | Freq: Three times a day (TID) | SUBCUTANEOUS | Status: DC
  Administered 2015-05-30 – 2015-05-31 (×3): 2 [IU] via SUBCUTANEOUS

## 2015-05-29 MED ORDER — ACETAMINOPHEN 500 MG PO TABS
1000.0000 mg | ORAL_TABLET | Freq: Once | ORAL | Status: AC
  Administered 2015-05-29: 1000 mg via ORAL
  Filled 2015-05-29: qty 2

## 2015-05-29 NOTE — Progress Notes (Signed)
ANTIBIOTIC CONSULT NOTE - INITIAL  Pharmacy Consult for Vancomycin & Renal Adjustment antibiotics Indication: pneumonia  Allergies  Allergen Reactions  . Codeine Nausea And Vomiting and Rash    Patient Measurements: Height:  (167.6 cm) Weight: 158 lb (71.668 kg) IBW/kg (Calculated) : 59.3 Adjusted Body Weight:   Vital Signs: Temp: 98.2 F (36.8 C) (11/05 1237) Temp Source: Oral (11/05 1237) BP: 110/39 mmHg (11/05 1237) Pulse Rate: 77 (11/05 1237) Intake/Output from previous day:   Intake/Output from this shift:    Labs:  Recent Labs  06/06/2015 0744  WBC 9.6  HGB 9.8*  PLT 90*  CREATININE 2.24*   Estimated Creatinine Clearance: 24.1 mL/min (by C-G formula based on Cr of 2.24). No results for input(s): VANCOTROUGH, VANCOPEAK, VANCORANDOM, GENTTROUGH, GENTPEAK, GENTRANDOM, TOBRATROUGH, TOBRAPEAK, TOBRARND, AMIKACINPEAK, AMIKACINTROU, AMIKACIN in the last 72 hours.   Microbiology: Recent Results (from the past 720 hour(s))  Blood culture (routine x 2)     Status: None (Preliminary result)   Collection Time: 06/06/2015 10:02 AM  Result Value Ref Range Status   Specimen Description BLOOD LEFT HAND  Final   Special Requests BOTTLES DRAWN AEROBIC AND ANAEROBIC 6CC  Final   Culture PENDING  Incomplete   Report Status PENDING  Incomplete  Blood culture (routine x 2)     Status: None (Preliminary result)   Collection Time: 05/30/2015 10:10 AM  Result Value Ref Range Status   Specimen Description BLOOD RIGHT HAND  Final   Special Requests BOTTLES DRAWN AEROBIC AND ANAEROBIC 6CC  Final   Culture PENDING  Incomplete   Report Status PENDING  Incomplete    Medical History: Past Medical History  Diagnosis Date  . Type 2 diabetes mellitus (HCC)   . Arthritis   . COPD (chronic obstructive pulmonary disease) (HCC)     Home oxygen  . Paroxysmal atrial fibrillation (HCC)   . Coronary atherosclerosis of native coronary artery     Nonobstructive at catheterization 2008  .  GERD (gastroesophageal reflux disease)   . Irritable bowel syndrome   . Hypothyroidism   . Hyperlipidemia   . Chronic edema   . Pneumonia   . Essential hypertension   . Chronic pain   . Anemia of chronic disease   . Bulging lumbar disc     3  . Headache(784.0)   . Sprain of neck   . Asthma   . Visual loss, left eye   . CKD (chronic kidney disease), stage II   . Claustrophobia   . Temporal arteritis (HCC)   . Memory difficulty   . NSTEMI (non-ST elevated myocardial infarction) [facility])     June 2016 [facility], managed medically  . Chronic insomnia     Medications:  Scheduled:  . ALPRAZolam  1 mg Oral TID  . amiodarone  200 mg Oral Daily  . aspirin EC  81 mg Oral q morning - 10a  . atorvastatin  80 mg Oral Daily  . clopidogrel  75 mg Oral Daily  . dexamethasone  0.5 mg Oral Daily  . docusate sodium  200 mg Oral Daily  . donepezil  5 mg Oral QHS  . insulin aspart  0-15 Units Subcutaneous TID WC  . insulin aspart  0-5 Units Subcutaneous QHS  . ipratropium-albuterol  3 mL Nebulization Q6H  . levothyroxine  100 mcg Oral QAC breakfast  . morphine  30 mg Oral BID  . pantoprazole  40 mg Oral BID AC  . piperacillin-tazobactam (ZOSYN)  IV  3.375 g  Intravenous Q8H  . potassium chloride SA  20 mEq Oral BID  . torsemide  20 mg Oral BID  . traZODone  200 mg Oral QHS  . [START ON 05/30/2015] vancomycin  750 mg Intravenous Q24H   Assessment: Patient admitted from ED with Health care associated pneumonia. Will start pt on IV abx per pneumonia order set. Vancomycin 1 GM IV given in ED Reduced renal function  Goal of Therapy:  Vancomycin trough level 15-20 mcg/ml  Plan:  Vancomycin 750 mg IV every 24 hours starting 05/30/15 at 10 AM Vancomycin trough at steady state Continue Zosyn 3.375 GM IV every 8 hours  Monitor renal function Labs per protocol  Raquel JamesPittman, Inaaya Vellucci Bennett 06/12/2015,2:19 PM

## 2015-05-29 NOTE — ED Provider Notes (Signed)
CSN: [id number]     Arrival date & time 05/27/2015  [id number] History   First MD Initiated Contact with Patient 06/08/2015 0715     Chief Complaint  Patient presents with  . Chest Pain     (Consider location/radiation/quality/duration/timing/severity/associated sxs/prior Treatment) Patient is a 69 y.o. female presenting with chest pain. The history is provided by the patient (Patient complains of weakness for a few days not eating much and also some mild chest discomfort).  Chest Pain Pain location:  Substernal area Pain quality: aching   Pain radiates to:  Does not radiate Pain radiates to the back: no   Pain severity:  Mild Onset quality:  Gradual Timing:  Constant Progression:  Waxing and waning Associated symptoms: no abdominal pain, no back pain, no cough, no fatigue and no headache     Past Medical History  Diagnosis Date  . Type 2 diabetes mellitus (HCC)   . Arthritis   . COPD (chronic obstructive pulmonary disease) (HCC)     Home oxygen  . Paroxysmal atrial fibrillation (HCC)   . Coronary atherosclerosis of native coronary artery     Nonobstructive at catheterization 2008  . GERD (gastroesophageal reflux disease)   . Irritable bowel syndrome   . Hypothyroidism   . Hyperlipidemia   . Chronic edema   . Pneumonia   . Essential hypertension   . Chronic pain   . Anemia of chronic disease   . Bulging lumbar disc     3  . Headache(784.0)   . Sprain of neck   . Asthma   . Visual loss, left eye   . CKD (chronic kidney disease), stage II   . Claustrophobia   . Temporal arteritis (HCC)   . Memory difficulty   . NSTEMI (non-ST elevated myocardial infarction) [facility](HCC)     June 2016 [facility], managed medically  . Chronic insomnia    Past Surgical History  Procedure Laterality Date  . Cholecystectomy    . Appendectomy    . Foot surgery    . Abdominal hysterectomy    . Cataract extraction w/phaco Left 09/01/2013    Procedure: CATARACT EXTRACTION PHACO AND INTRAOCULAR LENS  PLACEMENT (IOC);  Surgeon: Gemma PayorKerry Hunt, MD;  Location: [facility];  Service: Ophthalmology;  Laterality: Left;  CDE:  14.13  . Artery biopsy Left 09/22/2013    Procedure: BIOPSY TEMPORAL ARTERY;  Surgeon: Mariella SaaBenjamin T Hoxworth, MD;  Location: [facility];  Service: General;  Laterality: Left;   Family History  Problem Relation Age of Onset  . Hypertension    . Diabetes Brother   . Migraines Brother    Social History  Substance Use Topics  . Smoking status: Former Smoker -- 1.00 packs/day for 48 years    Types: Cigarettes    Start date: 05/26/1959    Quit date: 12/28/1995  . Smokeless tobacco: Never Used  . Alcohol Use: No   OB History    Gravida Para Term Preterm AB TAB SAB Ectopic Multiple Living   1 1 1             Review of Systems  Constitutional: Negative for appetite change and fatigue.  HENT: Negative for congestion, ear discharge and sinus pressure.   Eyes: Negative for discharge.  Respiratory: Negative for cough.   Cardiovascular: Positive for chest pain.  Gastrointestinal: Negative for abdominal pain and diarrhea.  Genitourinary: Negative for frequency and hematuria.  Musculoskeletal: Negative for back pain.  Skin: Negative for rash.  Neurological: Negative for seizures and headaches.  Psychiatric/Behavioral: Negative for hallucinations.      Allergies  Codeine  Home Medications   Prior to Admission medications   Medication Sig Start Date End Date Taking? Authorizing Provider  acetaminophen (TYLENOL) 500 MG tablet Take 1,000 mg by mouth every 8 (eight) hours.    Yes Historical Provider, MD  ALPRAZolam Prudy Feeler) 1 MG tablet Take 1 mg by mouth 3 (three) times daily.    Yes Historical Provider, MD  amiodarone (PACERONE) 200 MG tablet Take 1 tablet (200 mg total) by mouth daily. 04/13/15  Yes Jonelle Sidle, MD  aspirin EC 81 MG tablet Take 81 mg by mouth every morning.    Yes Historical Provider, MD  atorvastatin (LIPITOR) 80 MG tablet Take 1 tablet (80 mg total) by mouth  daily. 02/05/15  Yes Jodelle Gross, NP  clopidogrel (PLAVIX) 75 MG tablet Take 1 tablet (75 mg total) by mouth daily. 02/05/15  Yes Jodelle Gross, NP  dexamethasone (DECADRON) 0.5 MG tablet Take 1 tablet (0.5 mg total) by mouth daily. Patient taking differently: Take 0.5 mg by mouth every other day.  02/17/15  Yes York Spaniel, MD  docusate sodium (COLACE) 100 MG capsule Take 2 capsules (200 mg total) by mouth daily. 07/13/14  Yes Malissa Hippo, MD  donepezil (ARICEPT) 5 MG tablet Take 1 tablet (5 mg total) by mouth at bedtime. 02/17/15  Yes York Spaniel, MD  levothyroxine (SYNTHROID, LEVOTHROID) 100 MCG tablet Take 100 mcg by mouth daily.     Yes Historical Provider, MD  metFORMIN (GLUCOPHAGE) 1000 MG tablet Take 1,000 mg by mouth 2 (two) times daily with a meal.   Yes Historical Provider, MD  metoprolol tartrate (LOPRESSOR) 25 MG tablet Take 12.5 mg by mouth daily.   Yes Historical Provider, MD  morphine (MSIR) 30 MG tablet Take 30 mg by mouth 2 (two) times daily.    Yes Historical Provider, MD  nitroGLYCERIN (NITROSTAT) 0.4 MG SL tablet Place 1 tablet (0.4 mg total) under the tongue every 5 (five) minutes as needed for chest pain. 02/05/15  Yes Jodelle Gross, NP  ondansetron (ZOFRAN) 4 MG tablet Take 4 mg by mouth every 6 (six) hours as needed for nausea or vomiting.   Yes Historical Provider, MD  pantoprazole (PROTONIX) 40 MG tablet TAKE ONE TABLET TWICE DAILY BEFORE MEALS 03/15/15  Yes Len Blalock, NP  polyethylene glycol (MIRALAX / GLYCOLAX) packet Take 17 g by mouth daily as needed for mild constipation.   Yes Historical Provider, MD  potassium chloride SA (K-DUR,KLOR-CON) 20 MEQ tablet Take 20 mEq by mouth 2 (two) times daily.   Yes Historical Provider, MD  torsemide (DEMADEX) 20 MG tablet Take 1 tablet (20 mg total) by mouth 2 (two) times daily. 02/26/15  Yes Abelino Derrick, PA-C  traZODone (DESYREL) 100 MG tablet Take 2 tablets (200 mg total) by mouth at bedtime. As  directed.  If not effective may increase to  at night. 03/18/15  Yes York Spaniel, MD   BP 129/71 mmHg  Pulse 75  Temp(Src) 98.4 F (36.9 C) (Oral)  Resp 21  Ht  (1.676 m)  Wt 158 lb (71.668 kg)  BMI 25.51 kg/m2  SpO2 100% Physical Exam  Constitutional: She is oriented to person, place, and time. She appears well-developed.  HENT:  Head: Normocephalic.  Eyes: Conjunctivae and EOM are normal. No scleral icterus.  Neck: Neck supple. No thyromegaly present.  Cardiovascular: Normal rate and regular rhythm.  Exam reveals  no gallop and no friction rub.   No murmur heard. Pulmonary/Chest: No stridor. She has no wheezes. She has no rales. She exhibits no tenderness.  Abdominal: She exhibits no distension. There is no tenderness. There is no rebound.  Musculoskeletal: Normal range of motion. She exhibits no edema.  Lymphadenopathy:    She has no cervical adenopathy.  Neurological: She is oriented to person, place, and time. She exhibits normal muscle tone. Coordination normal.  Skin: No rash noted. No erythema.  Psychiatric: She has a normal mood and affect. Her behavior is normal.    ED Course  Procedures (including critical care time) Labs Review Labs Reviewed  CBC WITH DIFFERENTIAL/PLATELET - Abnormal; Notable for the following:    RBC 3.16 (*)    Hemoglobin 9.8 (*)    HCT 32.5 (*)    MCV 102.8 (*)    All other components within normal limits  COMPREHENSIVE METABOLIC PANEL - Abnormal; Notable for the following:    Glucose, Bld 106 (*)    BUN 35 (*)    Creatinine, Ser 2.24 (*)    Calcium 7.0 (*)    Total Protein 4.7 (*)    Albumin 2.1 (*)    AST 12 (*)    ALT 8 (*)    GFR calc non Af Amer 21 (*)    GFR calc Af Amer 25 (*)    All other components within normal limits  BRAIN NATRIURETIC PEPTIDE - Abnormal; Notable for the following:    B Natriuretic Peptide 370.0 (*)    All other components within normal limits  CULTURE, BLOOD (ROUTINE X 2)  CULTURE, BLOOD  (ROUTINE X 2)  TROPONIN I  I-STAT CG4 LACTIC ACID, ED    Imaging Review Dg Chest 2 View  06-25-15  CLINICAL DATA:  Bilateral rib pain. EXAM: CHEST  2 VIEW COMPARISON:  03/22/2015, 01/05/2015 and 09/01/2014 FINDINGS: The patient has a new extensive pulmonary infiltrate involving the right middle and lower lobes with consolidation. Heart size and pulmonary vascularity are normal. Left lung is clear although hyperinflated. Tiny new left effusion. No osseous abnormality. IMPRESSION: Pneumonia in the right middle and lower lobes.  COPD. Electronically Signed   By: Francene Boyers M.D.   On: 2015-06-25 08:57   I have personally reviewed and evaluated these images and lab results as part of my medical decision-making.   EKG Interpretation   Date/Time:  Saturday 06/25/15 06:57:00 EDT Ventricular Rate:  77 PR Interval:  184 QRS Duration: 89 QT Interval:  437 QTC Calculation: 495 R Axis:   78 Text Interpretation:  Sinus rhythm Borderline T abnormalities, anterior  leads Borderline prolonged QT interval Baseline wander in lead(s) II Since  last tracing rate slower 22 Mar 2015 Confirmed by KNAPP  MD-I, IVA ([id number])  on 06-25-15 7:05:49 AM      MDM   Final diagnoses:  Community acquired pneumonia    Patient with pneumonia she will be treated for hospital-acquired pneumonia and admitted    Bethann Berkshire, MD 06-25-2015 [phone]

## 2015-05-29 NOTE — Progress Notes (Signed)
Utilization review Completed Raylen Tangonan RN BSN   

## 2015-05-29 NOTE — Progress Notes (Signed)
The patient states she does not want to take any breathing treatments. She  also states she has taken them before and don't like the way they make her feel and she does not have a  promblem with her breathing at this. RN aware

## 2015-05-29 NOTE — H&P (Signed)
[facility] History and Physical  Kara ShirtsCarolyn W Prospero ZOX:[id number] DOB: 01/23/1946 DOA: 06/23/2015  Referring physician: Bethann BerkshireJoseph Zammit, MD PCP: Alice ReichertMCINNIS,ANGUS G, MD   Chief Complaint: CP  HPI: Kara [name] is a 69 y.o. female   [age] yof with multiple medical problems who is chronically on 3L [place] at home presents to the hospital with a variety of complaints. She reports that over the past two to three weeks she has had worsening SOB, intermitted CP, and worsening LE edema. She has been feeling increasingly weak over the past few weeks. Family reports that she has been having hot flashes. It is unclear if she has had a true fever. She denies cough, no vomiting. Family reports her PO intake has been poor.  Over the last two to three days, they felt that her breathing, weakness and CP has gotten worse which is what prompted the ED visit.  In the ED visit, she was found to have pneumonia on CXR and evidence of acute on chronic renal failure. She has been referred for admission.      Review of Systems:  Constitutional:  No weight loss, night sweats, Fevers, chills, fatigue.  HEENT:  No headaches, Difficulty swallowing,Tooth/dental problems,Sore throat,  No sneezing, itching, ear ache, nasal congestion, post nasal drip,  Cardio-vascular:  no Orthopnea, PND, swelling in lower extremities, anasarca, dizziness, palpitations  Yes CP GI:  No heartburn, indigestion, abdominal pain, vomiting, diarrhea, change in bowel habits Yes nausea Resp:   No excess mucus, no productive cough, No non-productive cough, No coughing up of blood.No change in color of mucus.No wheezing.No chest wall deformity  Yes SOB Skin:  no rash or lesions.  GU:  no dysuria, change in color of urine, no urgency or frequency. No flank pain.  Musculoskeletal:  No joint pain,  No decreased range of motion. No back pain.  LLE edema Psych:  No change in mood or affect. No depression or anxiety. No memory loss.   Past  Medical History  Diagnosis Date  . Type 2 diabetes mellitus (HCC)   . Arthritis   . COPD (chronic obstructive pulmonary disease) (HCC)     Home oxygen  . Paroxysmal atrial fibrillation (HCC)   . Coronary atherosclerosis of native coronary artery     Nonobstructive at catheterization 2008  . GERD (gastroesophageal reflux disease)   . Irritable bowel syndrome   . Hypothyroidism   . Hyperlipidemia   . Chronic edema   . Pneumonia   . Essential hypertension   . Chronic pain   . Anemia of chronic disease   . Bulging lumbar disc     3  . Headache(784.0)   . Sprain of neck   . Asthma   . Visual loss, left eye   . CKD (chronic kidney disease), stage II   . Claustrophobia   . Temporal arteritis (HCC)   . Memory difficulty   . NSTEMI (non-ST elevated myocardial infarction) [facility](HCC)     June 2016 [facility], managed medically  . Chronic insomnia    Past Surgical History  Procedure Laterality Date  . Cholecystectomy    . Appendectomy    . Foot surgery    . Abdominal hysterectomy    . Cataract extraction w/phaco Left 09/01/2013    Procedure: CATARACT EXTRACTION PHACO AND INTRAOCULAR LENS PLACEMENT (IOC);  Surgeon: Gemma PayorKerry Hunt, MD;  Location: [facility];  Service: Ophthalmology;  Laterality: Left;  CDE:  14.13  . Artery biopsy Left 09/22/2013    Procedure: BIOPSY TEMPORAL  ARTERY;  Surgeon: Mariella Saa, MD;  Location: [facility] OR;  Service: General;  Laterality: Left;   Social History:  reports that she quit smoking about 19 years ago. Her smoking use included Cigarettes. She started smoking about 56 years ago. She has a 48 pack-year smoking history. She has never used smokeless tobacco. She reports that she does not drink alcohol or use illicit drugs.  Allergies  Allergen Reactions  . Codeine Nausea And Vomiting and Rash    Family History  Problem Relation Age of Onset  . Hypertension    . Diabetes Brother   . Migraines Brother      Prior to Admission medications   Medication Sig Start  Date End Date Taking? Authorizing Provider  acetaminophen (TYLENOL) 500 MG tablet Take 1,000 mg by mouth every 8 (eight) hours.    Yes Historical Provider, MD  ALPRAZolam Prudy Feeler) 1 MG tablet Take 1 mg by mouth 3 (three) times daily.    Yes Historical Provider, MD  amiodarone (PACERONE) 200 MG tablet Take 1 tablet (200 mg total) by mouth daily. 04/13/15  Yes Jonelle Sidle, MD  aspirin EC 81 MG tablet Take 81 mg by mouth every morning.    Yes Historical Provider, MD  atorvastatin (LIPITOR) 80 MG tablet Take 1 tablet (80 mg total) by mouth daily. 02/05/15  Yes Jodelle Gross, NP  clopidogrel (PLAVIX) 75 MG tablet Take 1 tablet (75 mg total) by mouth daily. 02/05/15  Yes Jodelle Gross, NP  dexamethasone (DECADRON) 0.5 MG tablet Take 1 tablet (0.5 mg total) by mouth daily. Patient taking differently: Take 0.5 mg by mouth every other day.  02/17/15  Yes York Spaniel, MD  docusate sodium (COLACE) 100 MG capsule Take 2 capsules (200 mg total) by mouth daily. 07/13/14  Yes Malissa Hippo, MD  donepezil (ARICEPT) 5 MG tablet Take 1 tablet (5 mg total) by mouth at bedtime. 02/17/15  Yes York Spaniel, MD  levothyroxine (SYNTHROID, LEVOTHROID) 100 MCG tablet Take 100 mcg by mouth daily.     Yes Historical Provider, MD  metFORMIN (GLUCOPHAGE) 1000 MG tablet Take 1,000 mg by mouth 2 (two) times daily with a meal.   Yes Historical Provider, MD  metoprolol tartrate (LOPRESSOR) 25 MG tablet Take 12.5 mg by mouth daily.   Yes Historical Provider, MD  morphine (MSIR) 30 MG tablet Take 30 mg by mouth 2 (two) times daily.    Yes Historical Provider, MD  nitroGLYCERIN (NITROSTAT) 0.4 MG SL tablet Place 1 tablet (0.4 mg total) under the tongue every 5 (five) minutes as needed for chest pain. 02/05/15  Yes Jodelle Gross, NP  ondansetron (ZOFRAN) 4 MG tablet Take 4 mg by mouth every 6 (six) hours as needed for nausea or vomiting.   Yes Historical Provider, MD  pantoprazole (PROTONIX) 40 MG tablet TAKE  ONE TABLET TWICE DAILY BEFORE MEALS 03/15/15  Yes Len Blalock, NP  polyethylene glycol (MIRALAX / GLYCOLAX) packet Take 17 g by mouth daily as needed for mild constipation.   Yes Historical Provider, MD  potassium chloride SA (K-DUR,KLOR-CON) 20 MEQ tablet Take 20 mEq by mouth 2 (two) times daily.   Yes Historical Provider, MD  torsemide (DEMADEX) 20 MG tablet Take 1 tablet (20 mg total) by mouth 2 (two) times daily. 02/26/15  Yes Abelino Derrick, PA-C  traZODone (DESYREL) 100 MG tablet Take 2 tablets (200 mg total) by mouth at bedtime. As directed.  If not effective may increase to   at night. 03/18/15  Yes York Spaniel, MD   Physical Exam: Filed Vitals:   06/08/2015 0900 05/25/2015 0930 06/13/2015 1000 06/12/2015 1014  BP: 125/56 129/71 115/59   Pulse: 75     Temp:    97.7 F (36.5 C)  TempSrc:    Oral  Resp: Height:      Weight:      SpO2: 100%       Wt Readings from Last 3 Encounters:  05/31/2015 71.668 kg (158 lb)  04/13/15 71.668 kg (158 lb)  03/22/15 71.668 kg (158 lb)    General:  Appears calm and comfortable, lying in bed Eyes: PERRL, normal lids, irises & conjunctiva ENT: grossly normal hearing, lips & tongue Neck: no LAD, masses or thyromegaly Cardiovascular: RRR, no m/r/g. No LE edema. Telemetry: SR, no arrhythmias  Respiratory: Coarse breath sounds at bases and right side. Increased respiratory effort.  Abdomen: soft, ntnd Skin: no rash or induration seen on limited exam Musculoskeletal: 1-2+ pedal edema bilaterally  Psychiatric: mildly confused  Neurologic: grossly non-focal.          Labs on Admission:  Basic Metabolic Panel:  Recent Labs Lab 06/07/2015 0744  NA 140  K 5.0  CL 102  CO2 31  GLUCOSE 106*  BUN 35*  CREATININE 2.24*  CALCIUM 7.0*   Liver Function Tests:  Recent Labs Lab 06/18/2015 0744  AST 12*  ALT 8*  ALKPHOS 73  BILITOT 0.7  PROT 4.7*  ALBUMIN 2.1*   No results for input(s): LIPASE, AMYLASE in the last 168 hours. No  results for input(s): AMMONIA in the last 168 hours. CBC:  Recent Labs Lab 06/21/2015 0744  WBC 9.6  NEUTROABS 7.7  HGB 9.8*  HCT 32.5*  MCV 102.8*  PLT 90*   Cardiac Enzymes:  Recent Labs Lab 05/31/2015 0744  TROPONINI <0.03    BNP (last 3 results)  Recent Labs  01/05/15 0432 03/22/15 1516 06/21/2015 0744  BNP 1473.0* 121.0* 370.0*    ProBNP (last 3 results) No results for input(s): PROBNP in the last 8760 hours.  CBG: No results for input(s): GLUCAP in the last 168 hours.  Radiological Exams on Admission: Dg Chest 2 View  06/20/2015  CLINICAL DATA:  Bilateral rib pain. EXAM: CHEST  2 VIEW COMPARISON:  03/22/2015, 01/05/2015 and 09/01/2014 FINDINGS: The patient has a new extensive pulmonary infiltrate involving the right middle and lower lobes with consolidation. Heart size and pulmonary vascularity are normal. Left lung is clear although hyperinflated. Tiny new left effusion. No osseous abnormality. IMPRESSION: Pneumonia in the right middle and lower lobes.  COPD. Electronically Signed   By: Francene Boyers M.D.   On: 05/28/2015 08:57    EKG: Independently reviewed. SR with non specific ST/T changes  Assessment/Plan Active Problems:   Pneumonia   1. Health care associated pneumonia. Will start pt on IV abx per pneumonia order set. Continue pulmonary hygiene 2. Chronic respiratory failure with hypoxia. Appears to be at baseline 3L [place] requirement.  Continue current treatments 3. COPD. No evidence of wheezing at this time.  Will continue as needed bronchodilators 4. Acute on chronic CKD stage III. possibly related to decreased PL intake in the setting of diuretic use. Pt was hypotensive in ED.  Will provide gentle hydration and will recheck labs in AM 5. Paroxysmal AFib. Continue on amiodarone, not felt to be a candidate for anticoagulation per cardiology notes.  6. Thrombocytopenia. Possibly related to acute infection. Will continue  to monitor. No evidence of  bleeding 7. Anemia of chronic disease. Likely related to renal disease. Appears to be at baseline. 8. DM type 2. SSI and hold metformin for now.  9. Chronic diastolic CHF. Continue outpatient dose of demadex  10. HTN. Hold BB since bp was low on admission.   11. Hypothyroidism. Continue synthroid.   12. CAD. Patient has been complaining of intermittent CP. Will monitor on telemetry and psychocardiac markers. Continue medical management with ASA and Plavix.  13. Anxiety. Continue on home dose of Xanax.  14. Dementia. She appears to be intermittently confused. Continue on Aricept     Code Status: Full DVT Prophylaxis: Lovenox Family Communication: sister, brother in law and husband at bedside.  Care plan was discussed in detail.  Disposition Plan: discharge once improved  Time spent: 55 minutes  Erick Blinks, MD [facility] Pager [phone]  By signing my name below, I, Burnett Harry, attest that this documentation has been prepared under the direction and in the presence of [facility]. MD Electronically Signed: Burnett Harry, Scribe. 2015-06-20 11:32 AM

## 2015-05-29 NOTE — ED Notes (Signed)
Patient complaining of pain in left and right ribs, pain in sacrum, and back pain. Started 2-3 days ago that comes and goes.

## 2015-05-29 NOTE — ED Notes (Signed)
Pt requesting routine Tylenol and Xanax. Per Dr Aileen PilotZammitt may have home dose

## 2015-05-29 NOTE — ED Notes (Addendum)
Pt uses O2 at 3 L/min via [place] at home

## 2015-05-30 LAB — BASIC METABOLIC PANEL
ANION GAP: 9 (ref 5–15)
BUN: 35 mg/dL — ABNORMAL HIGH (ref 6–20)
CO2: 30 mmol/L (ref 22–32)
Calcium: 6.7 mg/dL — ABNORMAL LOW (ref 8.9–10.3)
Chloride: 104 mmol/L (ref 101–111)
Creatinine, Ser: 1.94 mg/dL — ABNORMAL HIGH (ref 0.44–1.00)
GFR calc Af Amer: 29 mL/min — ABNORMAL LOW (ref >60)
GFR, EST NON AFRICAN AMERICAN: 25 mL/min — AB (ref >60)
Glucose, Bld: 107 mg/dL — ABNORMAL HIGH (ref 65–99)
POTASSIUM: 4.5 mmol/L (ref 3.5–5.1)
SODIUM: 143 mmol/L (ref 135–145)

## 2015-05-30 LAB — GLUCOSE, CAPILLARY
GLUCOSE-CAPILLARY: 118 mg/dL — AB (ref 65–99)
Glucose-Capillary: 110 mg/dL — ABNORMAL HIGH (ref 65–99)
Glucose-Capillary: 118 mg/dL — ABNORMAL HIGH (ref 65–99)
Glucose-Capillary: 122 mg/dL — ABNORMAL HIGH (ref 65–99)
Glucose-Capillary: 120 mg/dL — ABNORMAL HIGH (ref 65–99)

## 2015-05-30 LAB — TROPONIN I
TROPONIN I: 0.06 ng/mL — AB (ref <0.031)
TROPONIN I: 3.94 ng/mL — AB (ref <0.031)

## 2015-05-30 LAB — EXPECTORATED SPUTUM ASSESSMENT W REFEX TO RESP CULTURE

## 2015-05-30 LAB — CBC
HEMATOCRIT: 26.3 % — AB (ref 36.0–46.0)
HEMOGLOBIN: 7.9 g/dL — AB (ref 12.0–15.0)
MCH: 31.2 pg (ref 26.0–34.0)
MCHC: 30 g/dL (ref 30.0–36.0)
MCV: 104 fL — ABNORMAL HIGH (ref 78.0–100.0)
Platelets: 89 10*3/uL — ABNORMAL LOW (ref 150–400)
RBC: 2.53 MIL/uL — ABNORMAL LOW (ref 3.87–5.11)
RDW: 13.8 % (ref 11.5–15.5)
WBC: 7 10*3/uL (ref 4.0–10.5)

## 2015-05-30 LAB — APTT: APTT: 42 s — AB (ref 24–37)

## 2015-05-30 LAB — MRSA PCR SCREENING: MRSA by PCR: NEGATIVE

## 2015-05-30 LAB — CK TOTAL AND CKMB (NOT AT ARMC)
CK TOTAL: 32 U/L — AB (ref 38–234)
CK, MB: 4.2 ng/mL (ref 0.5–5.0)
RELATIVE INDEX: INVALID (ref 0.0–2.5)

## 2015-05-30 LAB — EXPECTORATED SPUTUM ASSESSMENT W GRAM STAIN, RFLX TO RESP C

## 2015-05-30 LAB — HIV ANTIBODY (ROUTINE TESTING W REFLEX): HIV SCREEN 4TH GENERATION: NONREACTIVE

## 2015-05-30 LAB — STREP PNEUMONIAE URINARY ANTIGEN: Strep Pneumo Urinary Antigen: NEGATIVE

## 2015-05-30 MED ORDER — HEPARIN BOLUS VIA INFUSION
4000.0000 [IU] | Freq: Once | INTRAVENOUS | Status: AC
  Administered 2015-05-30: 4000 [IU] via INTRAVENOUS
  Filled 2015-05-30: qty 4000

## 2015-05-30 MED ORDER — HEPARIN (PORCINE) IN NACL 100-0.45 UNIT/ML-% IJ SOLN
1200.0000 [IU]/h | INTRAMUSCULAR | Status: DC
  Administered 2015-05-30: 850 [IU]/h via INTRAVENOUS
  Filled 2015-05-30 (×2): qty 250

## 2015-05-30 MED ORDER — SODIUM CHLORIDE 0.9 % IV BOLUS (SEPSIS)
300.0000 mL | Freq: Once | INTRAVENOUS | Status: AC
  Administered 2015-05-30: 300 mL via INTRAVENOUS

## 2015-05-30 NOTE — Progress Notes (Signed)
ANTICOAGULATION CONSULT NOTE - Preliminary  Pharmacy Consult for heparin Indication: chest pain/ACS  Allergies  Allergen Reactions  . Codeine Nausea And Vomiting and Rash    Patient Measurements: Height: 5\' 6"  (167.6 cm) Weight: 158 lb (71.668 kg) IBW/kg (Calculated) : 59.3 HEPARIN DW (KG): 71.7   Vital Signs: Temp: 97.8 F (36.6 C) (11/06 1930) Temp Source: Oral (11/06 1930) BP: 87/47 mmHg (11/06 2300) Pulse Rate: 103 (11/06 2300)  Labs:  Recent Labs  05/30/2015 0744 05/30/15 0551 05/30/15 1052 05/30/15 1849  HGB 9.8* 7.9*  --   --   HCT 32.5* 26.3*  --   --   PLT 90* 89*  --   --   CREATININE 2.24* 1.94*  --   --   CKTOTAL  --   --  32*  --   CKMB  --   --  4.2  --   TROPONINI <0.03  --  0.06* 3.94*   Estimated Creatinine Clearance: 27.8 mL/min (by C-G formula based on Cr of 1.94).  Medical History: Past Medical History  Diagnosis Date  . Type 2 diabetes mellitus (HCC)   . Arthritis   . COPD (chronic obstructive pulmonary disease) (HCC)     Home oxygen  . Paroxysmal atrial fibrillation (HCC)   . Coronary atherosclerosis of native coronary artery     Nonobstructive at catheterization 2008  . GERD (gastroesophageal reflux disease)   . Irritable bowel syndrome   . Hypothyroidism   . Hyperlipidemia   . Chronic edema   . Pneumonia   . Essential hypertension   . Chronic pain   . Anemia of chronic disease   . Bulging lumbar disc     3  . Headache(784.0)   . Sprain of neck   . Asthma   . Visual loss, left eye   . CKD (chronic kidney disease), stage II   . Claustrophobia   . Temporal arteritis (HCC)   . Memory difficulty   . NSTEMI (non-ST elevated myocardial infarction) [facility](HCC)     June 2016 [facility], managed medically  . Chronic insomnia     Medications:  Infusions:  . sodium chloride 75 mL/hr at 05/30/15 1800  . heparin      Assessment: 69 yo with chest pain 9/10, elevated troponin.  Starting heparin.   Goal of Therapy:  Heparin level  0.3-0.7 units/ml   Plan:  Give 4000 units bolus x 1 Start heparin infusion at 850 units/hr Check anti-Xa level in 6 hours and daily while on heparin Continue to monitor H&H and platelets Preliminary review of pertinent patient information completed.  Jeani HawkingAnnie Penn clinical pharmacist will complete review during morning rounds to assess the patient and finalize treatment regimen.  Raeli Wiens, Berneice Heinrichiffany Scarlett, RPH 05/30/2015,11:40 PM

## 2015-05-30 NOTE — Progress Notes (Addendum)
Dr. Janna ArchonDiego paged and called back.  Notified of patient's VS, tachypnea, clammy, c/o chest pain remains 9/10 across chest after medications (Nitro, scheduled pain medicine, and Xanax) were given.  Dr. Janna ArchonDiego gave order for cardiac enzymes now and every 8 hours x 3 for Troponin and CK now and to transfer to ICU.  Patient requested that her friend/caregiver, "Ree KidaJack" be notified.  RN notified caregiver.

## 2015-05-30 NOTE — Progress Notes (Signed)
/  CRITICAL VALUE ALERT  Critical value received:  Troponin 3.94   Date of notification:  05/30/15  Time of notification:  1949  Critical value read back: yes  Nurse who received alert:  Foye Deer. Cass Edinger RN  MD notified (1st page):  Dr. Reyes Ivan. Dondiego  Time of first page:  2027  MD notified (2nd page):  Time of second page:  Responding MD:  Dr. Reyes Ivan. Dondiego   Time MD responded:  2028  See Epic for orders

## 2015-05-30 NOTE — Progress Notes (Addendum)
Dr. Janna ArchonDiego paged for EKG results.  [phone] - Dr. Janna ArchonDiego returned page.  Notified of EKG results ST 124 rate and that patient's heart rate on telemetry went up to 150.  Scheduled Xanax and Morphine po given.  Tachypnic and c/o chest pain.Dr. Janna ArchonDiego stated to "give medicine about 45 minutes to work and continue to monitor."

## 2015-05-30 NOTE — Progress Notes (Signed)
Subjective: The patient is alert and oriented. She presented to ED with respiratory symptoms and was found to have evidence of pneumonia. She also has chronic respiratory failure and is on nasal O2. She does have other medical problems diabetes mellitus type 2, chronic diastolic CHF, hypertension, hypothyroidism, coronary artery disease and dementia  Objective: Vital signs in last 24 hours: Temp:  [97.7 F (36.5 C)-99.2 F (37.3 C)] 99.2 F (37.3 C) (11/06 96040613) Pulse Rate:  [70-89] 82 (11/06 0613) Resp:  [14-21] 16 (11/06 0613) BP: (75-129)/(31-71) 113/31 mmHg (11/06 0613) SpO2:  [97 %-100 %] 97 % (11/06 54090613) Weight change:  Last BM Date: 05/28/15  Intake/Output from previous day: 11/05 0701 - 11/06 0700 In: 406.3 [I.V.:406.3] Out: 1350 [Urine:1350] Intake/Output this shift:    Physical Exam: Gen. appearance patient is alert and oriented  H EENT negative decreased vision in left eye  Neck supple no JVD or thyroid abnormalities  Heart regular rhythm  Lungs rhonchi over lower lung field  Abdomen no palpable organs or masses  Extremities free of edema     Recent Labs  06/10/2015 0744  WBC 9.6  HGB 9.8*  HCT 32.5*  PLT 90*   BMET  Recent Labs  05/25/2015 0744  NA 140  K 5.0  CL 102  CO2 31  GLUCOSE 106*  BUN 35*  CREATININE 2.24*  CALCIUM 7.0*    Studies/Results: Dg Chest 2 View  06/22/2015  CLINICAL DATA:  Bilateral rib pain. EXAM: CHEST  2 VIEW COMPARISON:  03/22/2015, 01/05/2015 and 09/01/2014 FINDINGS: The patient has a new extensive pulmonary infiltrate involving the right middle and lower lobes with consolidation. Heart size and pulmonary vascularity are normal. Left lung is clear although hyperinflated. Tiny new left effusion. No osseous abnormality. IMPRESSION: Pneumonia in the right middle and lower lobes.  COPD. Electronically Signed   By: Francene BoyersJames  Maxwell M.D.   On: 06/18/2015 08:57    Medications:  . ALPRAZolam  1 mg Oral TID  . amiodarone   200 mg Oral Daily  . aspirin EC  81 mg Oral q morning - 10a  . atorvastatin  80 mg Oral Daily  . clopidogrel  75 mg Oral Daily  . dexamethasone  0.5 mg Oral Daily  . docusate sodium  200 mg Oral Daily  . donepezil  5 mg Oral QHS  . insulin aspart  0-15 Units Subcutaneous TID WC  . insulin aspart  0-5 Units Subcutaneous QHS  . levothyroxine  100 mcg Oral QAC breakfast  . morphine  30 mg Oral BID  . pantoprazole  40 mg Oral BID AC  . piperacillin-tazobactam (ZOSYN)  IV  3.375 g Intravenous Q8H  . potassium chloride SA  20 mEq Oral BID  . torsemide  20 mg Oral BID  . traZODone  200 mg Oral QHS  . vancomycin  750 mg Intravenous Q24H    . sodium chloride 75 mL/hr at 05/30/15 0435     Assessment/Plan: 1. Pneumonia right left and lower lobes plan to continue IV antibiotics vancomycin and Zosyn  2. COPD, chronic respiratory failure-continue current antibiotics nasal O2 with elevated neb treatments  3. Acute on chronic CK D stage III-continue to monitor following hydration  4. Diabetes mellitus type 2-continue to monitor  Hypothyroidism-continue Synthroid  Hypertension continue to monitor  Paroxysmal atrial fibrillation-continue amiodarone   LOS: 1 day   Keymani Glynn G 05/30/2015, 7:21 AM

## 2015-05-30 NOTE — Progress Notes (Signed)
Patient transferred to ICU room 5 with belongings.

## 2015-05-30 NOTE — Progress Notes (Signed)
Patient pressed call bell and reports having difficulty breathing.  Patient heart rate in 120-130s, sats 93-94% on 2L [place]  Increased to 3L [place] per patient's chronic use), BP increased, tachypnea.  Respiratory notified.  Breathing treatment to be given.  Patient reports chest pain.  Dr. Janna ArchonDiego notified and gave order for EKG 12 lead ordered and give Xanax as ordered.

## 2015-05-31 ENCOUNTER — Inpatient Hospital Stay (HOSPITAL_COMMUNITY): Payer: Medicare HMO

## 2015-05-31 ENCOUNTER — Encounter (HOSPITAL_COMMUNITY): Payer: Self-pay

## 2015-05-31 DIAGNOSIS — R0689 Other abnormalities of breathing: Secondary | ICD-10-CM

## 2015-05-31 DIAGNOSIS — R0600 Dyspnea, unspecified: Secondary | ICD-10-CM

## 2015-05-31 DIAGNOSIS — R7989 Other specified abnormal findings of blood chemistry: Secondary | ICD-10-CM

## 2015-05-31 DIAGNOSIS — I214 Non-ST elevation (NSTEMI) myocardial infarction: Secondary | ICD-10-CM

## 2015-05-31 DIAGNOSIS — I10 Essential (primary) hypertension: Secondary | ICD-10-CM

## 2015-05-31 LAB — BASIC METABOLIC PANEL
ANION GAP: 9 (ref 5–15)
BUN: 30 mg/dL — ABNORMAL HIGH (ref 6–20)
CO2: 27 mmol/L (ref 22–32)
Calcium: 6.8 mg/dL — ABNORMAL LOW (ref 8.9–10.3)
Chloride: 105 mmol/L (ref 101–111)
Creatinine, Ser: 1.75 mg/dL — ABNORMAL HIGH (ref 0.44–1.00)
GFR calc Af Amer: 33 mL/min — ABNORMAL LOW (ref >60)
GFR calc non Af Amer: 29 mL/min — ABNORMAL LOW (ref >60)
GLUCOSE: 132 mg/dL — AB (ref 65–99)
POTASSIUM: 4.4 mmol/L (ref 3.5–5.1)
Sodium: 141 mmol/L (ref 135–145)

## 2015-05-31 LAB — CBC WITH DIFFERENTIAL/PLATELET
BASOS ABS: 0 10*3/uL (ref 0.0–0.1)
BASOS PCT: 0 %
Eosinophils Absolute: 0.2 10*3/uL (ref 0.0–0.7)
Eosinophils Relative: 2 %
HEMATOCRIT: 29.2 % — AB (ref 36.0–46.0)
HEMOGLOBIN: 8.8 g/dL — AB (ref 12.0–15.0)
Lymphocytes Relative: 11 %
Lymphs Abs: 1.1 10*3/uL (ref 0.7–4.0)
MCH: 31.3 pg (ref 26.0–34.0)
MCHC: 30.1 g/dL (ref 30.0–36.0)
MCV: 103.9 fL — ABNORMAL HIGH (ref 78.0–100.0)
Monocytes Absolute: 0.5 10*3/uL (ref 0.1–1.0)
Monocytes Relative: 5 %
NEUTROS ABS: 8.2 10*3/uL — AB (ref 1.7–7.7)
NEUTROS PCT: 82 %
Platelets: 117 10*3/uL — ABNORMAL LOW (ref 150–400)
RBC: 2.81 MIL/uL — ABNORMAL LOW (ref 3.87–5.11)
RDW: 13.8 % (ref 11.5–15.5)
WBC: 10 10*3/uL (ref 4.0–10.5)

## 2015-05-31 LAB — GLUCOSE, CAPILLARY
GLUCOSE-CAPILLARY: 114 mg/dL — AB (ref 65–99)
GLUCOSE-CAPILLARY: 105 mg/dL — AB (ref 65–99)
Glucose-Capillary: 145 mg/dL — ABNORMAL HIGH (ref 65–99)
Glucose-Capillary: 123 mg/dL — ABNORMAL HIGH (ref 65–99)

## 2015-05-31 LAB — MAGNESIUM: MAGNESIUM: 0.9 mg/dL — AB (ref 1.7–2.4)

## 2015-05-31 LAB — HEPARIN LEVEL (UNFRACTIONATED)
Heparin Unfractionated: 0.12 IU/mL — ABNORMAL LOW (ref 0.30–0.70)
Heparin Unfractionated: 0.3 IU/mL (ref 0.30–0.70)

## 2015-05-31 LAB — TROPONIN I
Troponin I: 4.16 ng/mL (ref <0.031)
Troponin I: 3.84 ng/mL (ref <0.031)
Troponin I: 3.25 ng/mL (ref <0.031)

## 2015-05-31 LAB — ALBUMIN: ALBUMIN: 1.9 g/dL — AB (ref 3.5–5.0)

## 2015-05-31 LAB — PROTIME-INR
INR: 1.2 (ref 0.00–1.49)
PROTHROMBIN TIME: 15.4 s — AB (ref 11.6–15.2)

## 2015-05-31 MED ORDER — LEVALBUTEROL HCL 0.63 MG/3ML IN NEBU: 0.6300 mg | INHALATION_SOLUTION | Freq: Four times a day (QID) | RESPIRATORY_TRACT | Status: DC | PRN

## 2015-05-31 MED ORDER — HEPARIN BOLUS VIA INFUSION
1000.0000 [IU] | Freq: Once | INTRAVENOUS | Status: AC
  Administered 2015-05-31: 1000 [IU] via INTRAVENOUS
  Filled 2015-05-31: qty 1000

## 2015-05-31 MED ORDER — MAGNESIUM SULFATE 2 GM/50ML IV SOLN
2.0000 g | Freq: Once | INTRAVENOUS | Status: AC
  Administered 2015-05-31: 2 g via INTRAVENOUS
  Filled 2015-05-31: qty 50

## 2015-05-31 MED ORDER — MAGNESIUM OXIDE 400 (241.3 MG) MG PO TABS
400.0000 mg | ORAL_TABLET | Freq: Three times a day (TID) | ORAL | Status: DC
Start: 2015-05-31 — End: 2015-05-31
  Administered 2015-05-31: 400 mg via ORAL
  Filled 2015-05-31: qty 1

## 2015-05-31 MED ORDER — TECHNETIUM TC 99M DIETHYLENETRIAME-PENTAACETIC ACID
30.0000 | Freq: Once | INTRAVENOUS | Status: DC | PRN
Start: 2015-05-31 — End: 2015-06-01
  Administered 2015-05-31: 30 via RESPIRATORY_TRACT
  Filled 2015-05-31: qty 30

## 2015-05-31 MED ORDER — CETYLPYRIDINIUM CHLORIDE 0.05 % MT LIQD
7.0000 mL | Freq: Two times a day (BID) | OROMUCOSAL | Status: DC
  Administered 2015-05-31: 7 mL via OROMUCOSAL

## 2015-05-31 MED ORDER — METOPROLOL TARTRATE 25 MG PO TABS
12.5000 mg | ORAL_TABLET | Freq: Two times a day (BID) | ORAL | Status: DC
  Administered 2015-06-01: 12.5 mg via ORAL
  Filled 2015-05-31: qty 1

## 2015-05-31 MED ORDER — TECHNETIUM TO 99M ALBUMIN AGGREGATED
4.0000 | Freq: Once | INTRAVENOUS | Status: AC | PRN
  Administered 2015-05-31: 4 via INTRAVENOUS

## 2015-05-31 MED ORDER — NITROGLYCERIN IN D5W 200-5 MCG/ML-% IV SOLN
5.0000 ug/min | INTRAVENOUS | Status: DC
  Administered 2015-05-31: 5 ug/min via INTRAVENOUS
  Filled 2015-05-31: qty 250

## 2015-05-31 NOTE — Progress Notes (Signed)
Initial Nutrition Assessment  DOCUMENTATION CODES:   Not applicable  INTERVENTION:  Magic cup daily with dinner (provides 290 kcal and 9 grams of protein)   Recommend daily weights  NUTRITION DIAGNOSIS:   Inadequate oral intake related to poor appetite as evidenced by per patient/family report.   GOAL:   Patient will meet greater than or equal to 90% of their needs    MONITOR:   PO intake, Weight trends, Labs and I/O's  REASON FOR ASSESSMENT:   Malnutrition Screening Tool    ASSESSMENT: Pt has hx of decreased appetite and po intake prior to admission. Pt says she's mostly been just eating snack type foods such as peanut butter crackers and goes on the say she she's "not allowed" to bring Ensure in the house.   No significant findings nutrition focused exam other than her lower extremity edema. Her weight has increased significantly 3.7% in 2 days. Abnormal Labs: Albumin 1.9, BUN 30, Cr 1.75 glucose 132,  Magnesium 0.9.  Diet Order:  Diet heart healthy/carb modified Room service appropriate?: Yes; Fluid consistency:: Thin  Skin:   dry, intact   Last BM:   11/4   Height:   Ht Readings from Last 1 Encounters:  10-21-2014 5\' 6"  (1.676 m)    Weight:   Wt Readings from Last 1 Encounters:  05/31/15 164 lb 3.9 oz (74.5 kg)    Ideal Body Weight:  59 kg  BMI:  Body mass index is 26.52 kg/(m^2).  Estimated Nutritional Needs:   Kcal:  [id number]  Protein:  85-90 gr  Fluid:  1.6-1.8 liters daily  EDUCATION NEEDS:   No education needs identified at this time  Royann ShiversLynn Jamesia Linnen MS,RD,CSG,LDN Office: #[phone] Pager: [phone]

## 2015-05-31 NOTE — Progress Notes (Signed)
CRITICAL VALUE ALERT  Critical value received:  Mg 0.9 and Troponin 4.16  Date of notification:  05/31/15  Time of notification:  0507  Critical value read back: yes  Nurse who received alert:  Foye Deer. Collin Hendley RN  MD notified (1st page):  Dr. Reyes Ivan. Dondiego  Time of first page:  0510  MD notified (2nd page):  Time of second page:  Responding MD:  Dr. Reyes Ivan. Dondiego  Time MD responded:  [phone]  See Epic for orders

## 2015-05-31 NOTE — Progress Notes (Signed)
Physicians are aware of elevated troponin level  Current level has decreased.

## 2015-05-31 NOTE — Progress Notes (Addendum)
ANTICOAGULATION CONSULT NOTE   Pharmacy Consult for heparin Indication: chest pain/ACS  Allergies  Allergen Reactions  . Codeine Nausea And Vomiting and Rash    Patient Measurements: Height:  (167.6 cm) Weight: 164 lb 3.9 oz (74.5 kg) IBW/kg (Calculated) : 59.3 HEPARIN DW (KG): 71.7   Vital Signs: Temp: 97.6 F (36.4 C) (11/07 0726) Temp Source: Oral (11/07 0726) BP: 113/66 mmHg (11/07 0600) Pulse Rate: 119 (11/07 0600)  Labs:  Recent Labs  06/10/2015 0744 05/30/15 0551 05/30/15 1052 05/30/15 1849 05/30/15 2330 05/31/15 0304 05/31/15 0631  HGB 9.8* 7.9*  --   --   --  8.8*  --   HCT 32.5* 26.3*  --   --   --  29.2*  --   PLT 90* 89*  --   --   --  117*  --   APTT  --   --   --   --  42*  --   --   LABPROT  --   --   --   --  15.4*  --   --   INR  --   --   --   --  1.20  --   --   HEPARINUNFRC  --   --   --   --   --   --  0.12*  CREATININE 2.24* 1.94*  --   --   --  1.75*  --   CKTOTAL  --   --  32*  --   --   --   --   CKMB  --   --  4.2  --   --   --   --   TROPONINI <0.03  --  0.06* 3.94*  --  4.16*  --    Estimated Creatinine Clearance: 31.3 mL/min (by C-G formula based on Cr of 1.75).  Medical History: Past Medical History  Diagnosis Date  . Type 2 diabetes mellitus (HCC)   . Arthritis   . COPD (chronic obstructive pulmonary disease) (HCC)     Home oxygen  . Paroxysmal atrial fibrillation (HCC)   . Coronary atherosclerosis of native coronary artery     Nonobstructive at catheterization 2008  . GERD (gastroesophageal reflux disease)   . Irritable bowel syndrome   . Hypothyroidism   . Hyperlipidemia   . Chronic edema   . Pneumonia   . Essential hypertension   . Chronic pain   . Anemia of chronic disease   . Bulging lumbar disc     3  . Headache(784.0)   . Sprain of neck   . Asthma   . Visual loss, left eye   . CKD (chronic kidney disease), stage II   . Claustrophobia   . Temporal arteritis (HCC)   . Memory difficulty   . NSTEMI  (non-ST elevated myocardial infarction) [facility])     June 2016 [facility], managed medically  . Chronic insomnia     Medications:  Infusions:  . sodium chloride 75 mL/hr at 05/30/15 2100  . heparin 850 Units/hr (05/31/15 0300)  . nitroGLYCERIN 5 mcg/min (05/31/15 5009)    Assessment: 69 yo with chest pain and elevated troponin on heparin drip.  Initial heparin level is low this am 0.12 units/ml  Goal of Therapy:  Heparin level 0.3-0.7 units/ml   Plan:  Will increase heparin drip to 1050 units/hr and give small bolus of 1000 units. Check heparin level ~ 6 hours after rate change.  Woodfin Ganja, RPH 05/31/2015,7:38  AM  Heparin level therapeutic at 0.3 units/ml.  Cont drip at 1050 units/hr.  F/u am labs

## 2015-05-31 NOTE — Consult Note (Signed)
CARDIOLOGY CONSULT NOTE   Patient ID: Kara [name] MRN: [id number] DOB/AGE: 1945-08-02 69 y.o.  Admit Date: May 31, 2015 Referring Physician: Butch Penny MD Primary Physician: [name], MD Consulting Cardiologist: Dina Rich MD Primary Cardiologist: Nona Dell MD Reason for Consultation: Elevated troponin  Clinical Summary Kara [name] is a 69 y.o.female with known history of PAF placed on amiodarone, not a candidate for anticoagulation due to anemia, COPD, hypertension, recent admission to [facility] June 2016 with NSTEMI:  "Pt noted to have TWI in V3-V6 and mild troponin elevation at OSH. She was loaded on ASA 325 mg followed by 75 mg daily and loaded on Plavix 300 mg with 75 mg daily. She was started on Lipitor 80 mg, Coreg 6.25 mg BID, lisinopril, and lovenox 1 mg /kg BID. Serial Troponins were ordered with trend from 5.072 to 3.755 and 3.682. Repeat ECHO showed LVEF 45-50%, basal and mid LV inferior wall hypokinesis, indeterminate filling and mild AR and mild-moderate MR. Pt was continued on medical management and was placed on heparin gtt x 48 hours. Cardiology was consulted and discussion was had regarding possible cardiac catheterization but pt declined interest"  She was admitted with complaints of dyspnea, intermittent chest pain, LE edema. She denies medical non-compliance. Lives with "a friend" who makes sure she takes her medications. In ER she was found to be hypotensive with HR of 76 bpm. O2 sat 100%. Lactic acid 2.17. Hgb 9.8, Hct 32.5. Creatinine 2.24, BUN 35. CXR demonstrated pnuemonia in the right middle lobes. EKG demonstrated NSR with prolonged QT interval. Troponin has become positive at 0.06, 3.94, and 4.16 respectively. Hgb 8.8 on most recent labs.   She was treated with Xanax and Vacomycin. She has since been placed on NTG gtt, and Zosyn. Magnesium was found to be 0.9 and was initially treated with po magnesium but now has been ordered IV.  She is currently complaining of dyspnea with O2 Sat of 99%. She is tachycardic rate of 126 bpm complaining of palpitations. VQ scan has not been completed. She is anxious. Denies chest pain.    Allergies  Allergen Reactions  . Codeine Nausea And Vomiting and Rash    Medications Scheduled Medications: . ALPRAZolam  1 mg Oral TID  . amiodarone  200 mg Oral Daily  . antiseptic oral rinse  7 mL Mouth Rinse BID  . aspirin EC  81 mg Oral q morning - 10a  . atorvastatin  80 mg Oral Daily  . clopidogrel  75 mg Oral Daily  . dexamethasone  0.5 mg Oral Daily  . docusate sodium  200 mg Oral Daily  . donepezil  5 mg Oral QHS  . insulin aspart  0-15 Units Subcutaneous TID WC  . insulin aspart  0-5 Units Subcutaneous QHS  . levothyroxine  100 mcg Oral QAC breakfast  . magnesium sulfate 1 - 4 g bolus IVPB  2 g Intravenous Once  . morphine  30 mg Oral BID  . pantoprazole  40 mg Oral BID AC  . piperacillin-tazobactam (ZOSYN)  IV  3.375 g Intravenous Q8H  . potassium chloride SA  20 mEq Oral BID  . traZODone  200 mg Oral QHS  . vancomycin  750 mg Intravenous Q24H    Infusions: . sodium chloride 75 mL/hr at 05/30/15 2100  . heparin 1,050 Units/hr (05/31/15 0737)  . nitroGLYCERIN 5 mcg/min (05/31/15 0626)    PRN Medications: acetaminophen, ipratropium-albuterol, nitroGLYCERIN, ondansetron (ZOFRAN) IV, polyethylene glycol   Past Medical History  Diagnosis  Date  . Type 2 diabetes mellitus (HCC)   . Arthritis   . COPD (chronic obstructive pulmonary disease) (HCC)     Home oxygen  . Paroxysmal atrial fibrillation (HCC)   . Coronary atherosclerosis of native coronary artery     Nonobstructive at catheterization 2008  . GERD (gastroesophageal reflux disease)   . Irritable bowel syndrome   . Hypothyroidism   . Hyperlipidemia   . Chronic edema   . Pneumonia   . Essential hypertension   . Chronic pain   . Anemia of chronic disease   . Bulging lumbar disc     3  . Headache(784.0)   .  Sprain of neck   . Asthma   . Visual loss, left eye   . CKD (chronic kidney disease), stage II   . Claustrophobia   . Temporal arteritis (HCC)   . Memory difficulty   . NSTEMI (non-ST elevated myocardial infarction) [facility])     June 2016 [facility], managed medically  . Chronic insomnia     Past Surgical History  Procedure Laterality Date  . Cholecystectomy    . Appendectomy    . Foot surgery    . Abdominal hysterectomy    . Cataract extraction w/phaco Left 09/01/2013    Procedure: CATARACT EXTRACTION PHACO AND INTRAOCULAR LENS PLACEMENT (IOC);  Surgeon: Gemma Payor, MD;  Location: [facility] ORS;  Service: Ophthalmology;  Laterality: Left;  CDE:  14.13  . Artery biopsy Left 09/22/2013    Procedure: BIOPSY TEMPORAL ARTERY;  Surgeon: Mariella Saa, MD;  Location: [facility] OR;  Service: General;  Laterality: Left;    Family History  Problem Relation Age of Onset  . Hypertension    . Diabetes Brother   . Migraines Brother     Social History Kara [name] reports that she quit smoking about 19 years ago. Her smoking use included Cigarettes. She started smoking about 56 years ago. She has a 48 pack-year smoking history. She has never used smokeless tobacco. Kara [name] reports that she does not drink alcohol.  Review of Systems Complete review of systems are found to be negative unless outlined in H&P above.  Physical Examination Blood pressure 101/59, pulse 108, temperature 97.6 F (36.4 C), temperature source Oral, resp. rate 17, height  (1.676 m), weight 164 lb 3.9 oz (74.5 kg), SpO2 100 %.  Intake/Output Summary (Last 24 hours) at 05/31/15 0929 Last data filed at 05/31/15 0626  Gross per 24 hour  Intake 3293.27 ml  Output   1100 ml  Net 2193.27 ml    Telemetry: Sinus tachycardia rate of 126 bpn.   GEN:Ill appearing  HEENT: Conjunctiva and lids normal, oropharynx clear with moist mucosa. Neck: Supple, no elevated JVP or carotid bruits, no thyromegaly. Lungs: Bilateral  inspiratory/expiratory wheezes with poor inspiratory effort.  Cardiac: Regular rate and rhythm, no S3 or significant systolic murmur, no pericardial rub. Abdomen: Soft, nontender, no hepatomegaly, bowel sounds present, no guarding or rebound. Extremities: No pitting edema, distal pulses 2+. Skin: Warm and dry.Pale Musculoskeletal: No kyphosis. Neuropsychiatric: Alert and poor memory. Anxious.   Prior Cardiac Testing/Procedures Cath at [facility] 06/01/1997 Right Coronary Artery Acute Marginal (Large) RPL1 (Small) RPL3 (Small) Left Circumflex OM1 (Small) OM2 (Small) OM3 (Large) Right Coronary Artery normal Left Main normal Left Circumflex normal Left Anterior Descending normal Tree Comment: The Acute Marginal artery gives rise to the Posterior Descending artery.  Echocardiogram 03/22/2015 Left ventricle: The cavity size was normal. Wall thickness was increased in a pattern of  mild LVH. Systolic function was normal. The estimated ejection fraction was in the range of 60% to 65%. Wall motion was normal; there were no regional wall motion abnormalities. The study is not technically sufficient to allow evaluation of LV diastolic function. - Aortic valve: Mildly calcified annulus. Mildly thickened leaflets. There was moderate regurgitation. Valve area (VTI): 2.11 cm^2. Valve area (Vmax): 2.05 cm^2. Valve area (Vmean): 1.92 cm^2. - Mitral valve: Mildly calcified annulus. Mildly thickened leaflets .Right ventricle: The cavity size was mildly dilated. - Tricuspid valve: There was mild-moderate regurgitation. - This is a limited study to evaluate RV and LV function.  Lab Results  Basic Metabolic Panel:  Recent Labs Lab Oct 03, 2014 0744 05/30/15 0551 05/31/15 0304 05/31/15 0305  NA 140 143 141  --   K 5.0 4.5 4.4  --   CL 102 104 105  --   CO2 31 30 27   --   GLUCOSE 106* 107* 132*  --   BUN 35* 35* 30*  --   CREATININE 2.24* 1.94* 1.75*  --   CALCIUM 7.0* 6.7*  6.8*  --   MG  --   --   --  0.9*    Liver Function Tests:  Recent Labs Lab Oct 03, 2014 0744 05/31/15 0305  AST 12*  --   ALT 8*  --   ALKPHOS 73  --   BILITOT 0.7  --   PROT 4.7*  --   ALBUMIN 2.1* 1.9*    CBC:  Recent Labs Lab Oct 03, 2014 0744 05/30/15 0551 05/31/15 0304  WBC 9.6 7.0 10.0  NEUTROABS 7.7  --  8.2*  HGB 9.8* 7.9* 8.8*  HCT 32.5* 26.3* 29.2*  MCV 102.8* 104.0* 103.9*  PLT 90* 89* 117*    Cardiac Enzymes:  Recent Labs Lab Oct 03, 2014 0744 05/30/15 1052 05/30/15 1849 05/31/15 0304  CKTOTAL  --  32*  --   --   CKMB  --  4.2  --   --   TROPONINI <0.03 0.06* 3.94* 4.16*    Radiology:   ECG: Sinus tachycardia with T-wave inversions in the anterior lateral leads, new from admission EKG.    Impression and Recommendations 1. NSTEMI MI: Troponin and EKG demonstrate ischemia with  Highest troponin at 4.16. She is not complaining of chest pain, only dyspnea. She is tachycardic but remains in NSR. She remains on NTG gtt and heparin gtt. Creatinine is 1.75 and therefore not a candidate for cardiac cath at this time. She will have limited study echo for changes in her LV fx. Continue ASA, Plavix. Statin  2. Dyspnea; Recheck CBC for anemia, with hx and down trending Hgb in am. Will check VQ lung scan for PE with tachycardia. Begin Xopenex treatments as this has less affect on HR. Continue O2.  3. CAD: Per history, non-obstructive in 1998. [facility], [facility], and [facility].  I find no records of recent cath in Care Everywhere only documentation that it was offered in June of 2016 and she declined. Treated medically.   4. COPD: Chronic for her. May need to change amiodarone to CCB. Will await echo for LV before starting to have definitive measurement.   5. Hx of Paroxysmal Atrial Fib CHADS VASC Score of 4 (HTN, AGE, FEMALE, CAD, DM). She is not a candidate for anticoagulation due to chronic anemia.   6. Hypomagnesemia: Now  given IV replacement.   Signed: Bettey MareKathryn M. Lawrence NP [facility]  05/31/2015, 9:29 AM Co-Sign MD Patient seen  and discussed with NP Lyman Bishop, I agree with her documentation. A/P as detailed below.  1. Pneumonia - abx per primary team. She has had some intermittent hypotension, but has not required pressors. She is on oral toresmide, will d/c  2. NSTEMI - anterolateral TWIs and elevated troponin. Echo is pending - she is on medical therapy with hep gtt, plavix (had been on at home since June 2016 NSTEMI at [facility]), ASA, atorva 80. Will start low dose beta blocker. No ACE-I due to borderline bp's and AKI - she had NSTEMI June 2016 at [facility] after presenting with respiratory failure. From review of those notes she also presented with TWIs in V3-V6, peak trop to 5 at that time. Managed medically due to her refusal for cath.  - this admit very similar to June 2016 admit. I suspect she has chronic obstructive CAD and in the setting of increased demand becomes ischemic. She states she would consider cath, will consider once renal function and pneumonia have improved.  - atypical chest pain worst with breathing and movement. Will d/c nitro drip as I don't believe it is helping her atypical pain and if she is vasodilated from her infection will work to lower her bp's further. Will start low dose beta blocker.  - repeat cxr, by exam she appears euvolemic - agree with VQ scan  3. Hypomagnesemia - Mg of 0.9. D/c oral magnesium, will give 2g IV today.   4. Afib - history of recurring anemia, she has not been on anticoag for her afib - she it tachycardic but remains in SR. Will continue amio. Start low dose lopressor in setting of ACS.   5. Anemia - history of chronic anemia, she is followed by [facility] heme/onc - in setting of ACS would keep Hgb 9-10  6. AKI - Cr 2.2 on admission, trending down to 1.7 - etiologies may include prerenal or ischemic injury related to prior hypotension - d/c torsemide -  once Cr improves would consider cath  7. Tachycardia - sinus tachycardia, physiologic in the setting of pneumonia, hypoxia, and ischemia - will start low dose lopressor, will not be overally aggressive at rate control given this is her normal physiologic response, however with ischemia would like to lower the rates    Dina Rich, M.D.

## 2015-05-31 NOTE — Progress Notes (Signed)
Echo shows new LV systolic dysfunction with LVEF 30%, apical and anterior hypokinesis suggestive of possible Takotsubo CM or LAD ischemia/infarct. [facility] reports LVEF 45-50%, basal inferior hypokinesis only.  Troponin has peaked and trending down, if acute LAD infarct led to this degree of apical and anterior wall hypokinesis would expect much higher troponin level and significant hemodynamic instability. I suspect Takotsubo stress induced CM is the more likely etiology of her systolic dysfunction in the setting of her severe pneumonia. Matched V/Q deficit consistent with the location of her pneumonia, not consistent with PE. CXR shows increased consolidation in right lung. From cardiac standpoint continue medical therapy, as Cr and respiratory status improves can consider cath at that time. Follow volume status closely with decreased LVEF. Condition is guarded given multiorgan dysfunction in patient with multiple advanced comorbidities. Would suggest the primary team consider addressing code status. We will continue to follow along from cardiac standpoint.    Dominga FerryJ Kiegan Macaraeg MD

## 2015-05-31 NOTE — Progress Notes (Signed)
Subjective: The patient is being treated for pneumonia and lower lobes. She developed anterior chest pain last evening and was moved to intensive care. She was started on heparin drip and nitroglycerin drip and is more comfortable. EKG did show septal infarct age her troponin levels have increased to 4.16  Objective: Vital signs in last 24 hours: Temp:  [97.5 F (36.4 C)-99 F (37.2 C)] 98.7 F (37.1 C) (11/07 0400) Pulse Rate:  [91-137] 119 (11/07 0600) Resp:  [11-36] 27 (11/07 0600) BP: (38-160)/(29-90) 113/66 mmHg (11/07 0600) SpO2:  [93 %-100 %] 100 % (11/07 0600) Weight:  [74.5 kg (164 lb 3.9 oz)] 74.5 kg (164 lb 3.9 oz) (11/07 0500) Weight change:  Last BM Date: 05/28/15  Intake/Output from previous day: 11/06 0701 - 11/07 0700 In: 3293.3 [P.O.:1140; I.V.:1553.3; IV Piggyback:600] Out: 1100 [Urine:1100] Intake/Output this shift: Total I/O In: 1893.3 [P.O.:540; I.V.:953.3; IV Piggyback:400] Out: 600 [Urine:600]  Physical Exam: Gen. appearance the patient is alert and oriented vital signs are stable  HEENT negative decreased vision in left eye  Neck supple no JVD or thyroid abnormalities  Heart regular rhythm no murmurs  Lungs rhonchi over lower lung field    Abdomen no palpable organs or masses  Extremities free of edema   Recent Labs  05/30/15 0551 05/31/15 0304  WBC 7.0 10.0  HGB 7.9* 8.8*  HCT 26.3* 29.2*  PLT 89* 117*   BMET  Recent Labs  05/30/15 0551 05/31/15 0304  NA 143 141  K 4.5 4.4  CL 104 105  CO2 30 27  GLUCOSE 107* 132*  BUN 35* 30*  CREATININE 1.94* 1.75*  CALCIUM 6.7* 6.8*    Studies/Results: Dg Chest 2 View  06/22/2015  CLINICAL DATA:  Bilateral rib pain. EXAM: CHEST  2 VIEW COMPARISON:  03/22/2015, 01/05/2015 and 09/01/2014 FINDINGS: The patient has a new extensive pulmonary infiltrate involving the right middle and lower lobes with consolidation. Heart size and pulmonary vascularity are normal. Left lung is clear although  hyperinflated. Tiny new left effusion. No osseous abnormality. IMPRESSION: Pneumonia in the right middle and lower lobes.  COPD. Electronically Signed   By: Francene BoyersJames  Maxwell M.D.   On: 2014/11/07 08:57    Medications:  . ALPRAZolam  1 mg Oral TID  . amiodarone  200 mg Oral Daily  . antiseptic oral rinse  7 mL Mouth Rinse BID  . aspirin EC  81 mg Oral q morning - 10a  . atorvastatin  80 mg Oral Daily  . clopidogrel  75 mg Oral Daily  . dexamethasone  0.5 mg Oral Daily  . docusate sodium  200 mg Oral Daily  . donepezil  5 mg Oral QHS  . insulin aspart  0-15 Units Subcutaneous TID WC  . insulin aspart  0-5 Units Subcutaneous QHS  . levothyroxine  100 mcg Oral QAC breakfast  . magnesium oxide  400 mg Oral TID AC  . morphine  30 mg Oral BID  . pantoprazole  40 mg Oral BID AC  . piperacillin-tazobactam (ZOSYN)  IV  3.375 g Intravenous Q8H  . potassium chloride SA  20 mEq Oral BID  . torsemide  20 mg Oral BID  . traZODone  200 mg Oral QHS  . vancomycin  750 mg Intravenous Q24H    . sodium chloride 75 mL/hr at 05/30/15 2100  . heparin 850 Units/hr (05/31/15 0300)  . nitroGLYCERIN 5 mcg/min (05/31/15 [id number])     Assessment/Plan: 1. Pneumonia right left and lower lobes-plan to continue IV antibiotics  vancomycin and Zosyn  2. Chest pain-probable acute septal infarct-plan cardiology consult-continue heparin and nitroglycerin drip  3. COPD chronic respiratory failure-continue current antibiotics nasal O2 and nebulizer treatments  4. CK D stage III-continue to monitor continue to hydrate  5. Diabetes mellitus type 2-continue to monitor  6. Hypertension essential continue to monitor   LOS: 2 days   Arvell Pulsifer G 05/31/2015, 6:54 AM

## 2015-06-01 ENCOUNTER — Inpatient Hospital Stay (HOSPITAL_COMMUNITY): Payer: Medicare HMO

## 2015-06-01 DIAGNOSIS — J189 Pneumonia, unspecified organism: Secondary | ICD-10-CM

## 2015-06-01 LAB — CBC WITH DIFFERENTIAL/PLATELET
BASOS PCT: 0 %
Basophils Absolute: 0 10*3/uL (ref 0.0–0.1)
EOS ABS: 0 10*3/uL (ref 0.0–0.7)
EOS PCT: 0 %
HCT: 29.6 % — ABNORMAL LOW (ref 36.0–46.0)
HEMOGLOBIN: 8.7 g/dL — AB (ref 12.0–15.0)
LYMPHS ABS: 0.4 10*3/uL — AB (ref 0.7–4.0)
Lymphocytes Relative: 4 %
MCH: 31 pg (ref 26.0–34.0)
MCHC: 29.4 g/dL — AB (ref 30.0–36.0)
MCV: 105.3 fL — ABNORMAL HIGH (ref 78.0–100.0)
MONO ABS: 0.7 10*3/uL (ref 0.1–1.0)
MONOS PCT: 7 %
Neutro Abs: 8.4 10*3/uL — ABNORMAL HIGH (ref 1.7–7.7)
Neutrophils Relative %: 89 %
PLATELETS: 152 10*3/uL (ref 150–400)
RBC: 2.81 MIL/uL — ABNORMAL LOW (ref 3.87–5.11)
RDW: 14.4 % (ref 11.5–15.5)
WBC: 9.4 10*3/uL (ref 4.0–10.5)

## 2015-06-01 LAB — LEGIONELLA PNEUMOPHILA SEROGP 1 UR AG: L. pneumophila Serogp 1 Ur Ag: NEGATIVE

## 2015-06-01 LAB — TROPONIN I: Troponin I: 2.62 ng/mL (ref <0.031)

## 2015-06-01 LAB — GLUCOSE, CAPILLARY: GLUCOSE-CAPILLARY: 102 mg/dL — AB (ref 65–99)

## 2015-06-01 LAB — HEPARIN LEVEL (UNFRACTIONATED): Heparin Unfractionated: 0.25 IU/mL — ABNORMAL LOW (ref 0.30–0.70)

## 2015-06-01 LAB — TSH: TSH: 0.963 u[IU]/mL (ref 0.350–4.500)

## 2015-06-01 MED ORDER — HEPARIN BOLUS VIA INFUSION
1000.0000 [IU] | Freq: Once | INTRAVENOUS | Status: DC
  Filled 2015-06-01: qty 1000

## 2015-06-01 MED ORDER — AMIODARONE HCL IN DEXTROSE 360-4.14 MG/200ML-% IV SOLN
INTRAVENOUS | Status: AC
  Filled 2015-06-01: qty 200

## 2015-06-01 MED ORDER — AMIODARONE HCL IN DEXTROSE 360-4.14 MG/200ML-% IV SOLN: 60.0000 mg/h | INTRAVENOUS | Status: DC

## 2015-06-01 MED ORDER — SODIUM CHLORIDE 0.9 % IV BOLUS (SEPSIS)
500.0000 mL | Freq: Once | INTRAVENOUS | Status: AC
  Administered 2015-06-01: 500 mL via INTRAVENOUS

## 2015-06-01 MED ORDER — AMIODARONE HCL IN DEXTROSE 360-4.14 MG/200ML-% IV SOLN: 30.0000 mg/h | INTRAVENOUS | Status: DC

## 2015-06-01 MED FILL — Medication: Qty: 1 | Status: AC

## 2015-06-04 LAB — CULTURE, BLOOD (ROUTINE X 2)
CULTURE: NO GROWTH
CULTURE: NO GROWTH

## 2015-06-08 ENCOUNTER — Ambulatory Visit (INDEPENDENT_AMBULATORY_CARE_PROVIDER_SITE_OTHER): Payer: Medicare HMO | Admitting: Internal Medicine

## 2015-06-09 ENCOUNTER — Ambulatory Visit: Payer: Medicare HMO | Admitting: Cardiology

## 2015-06-22 ENCOUNTER — Ambulatory Visit: Payer: Self-pay | Admitting: Neurology

## 2015-06-24 NOTE — Progress Notes (Addendum)
ANTICOAGULATION CONSULT NOTE   Pharmacy Consult for heparin Indication: chest pain/ACS  Allergies  Allergen Reactions  . Codeine Nausea And Vomiting and Rash    Patient Measurements: Height:  (167.6 cm) Weight: 165 lb 9.1 oz (75.1 kg) IBW/kg (Calculated) : 59.3 HEPARIN DW (KG): 71.7   Vital Signs: Temp: 98.2 F (36.8 C) (11/08 0400) Temp Source: Oral (11/08 0400) BP: 82/47 mmHg (11/08 0500) Pulse Rate: 81 (11/08 0500)  Labs:  Recent Labs  Jun 28, 2015 0744 05/30/15 0551 05/30/15 1052  05/30/15 2330 05/31/15 0304 05/31/15 0631 05/31/15 1108 05/31/15 1400 05/31/15 1816 06/08/2015 0258 06/10/2015 0521  HGB 9.8* 7.9*  --   --   --  8.8*  --   --   --   --   --  8.7*  HCT 32.5* 26.3*  --   --   --  29.2*  --   --   --   --   --  29.6*  PLT 90* 89*  --   --   --  117*  --   --   --   --   --  152  APTT  --   --   --   --  42*  --   --   --   --   --   --   --   LABPROT  --   --   --   --  15.4*  --   --   --   --   --   --   --   INR  --   --   --   --  1.20  --   --   --   --   --   --   --   HEPARINUNFRC  --   --   --   --   --   --  0.12*  --  0.30  --   --  0.25*  CREATININE 2.24* 1.94*  --   --   --  1.75*  --   --   --   --   --   --   CKTOTAL  --   --  32*  --   --   --   --   --   --   --   --   --   CKMB  --   --  4.2  --   --   --   --   --   --   --   --   --   TROPONINI <0.03  --  0.06*  < >  --  4.16*  --  3.84*  --  3.25* 2.62*  --   < > = values in this interval not displayed. Estimated Creatinine Clearance: 31.4 mL/min (by C-G formula based on Cr of 1.75).  Medical History: Past Medical History  Diagnosis Date  . Type 2 diabetes mellitus (HCC)   . Arthritis   . COPD (chronic obstructive pulmonary disease) (HCC)     Home oxygen  . Paroxysmal atrial fibrillation (HCC)   . Coronary atherosclerosis of native coronary artery     Nonobstructive at catheterization 2008  . GERD (gastroesophageal reflux disease)   . Irritable bowel syndrome   .  Hypothyroidism   . Hyperlipidemia   . Chronic edema   . Pneumonia   . Essential hypertension   . Chronic pain   . Anemia of chronic disease   . Bulging lumbar disc  3  . Headache(784.0)   . Sprain of neck   . Asthma   . Visual loss, left eye   . CKD (chronic kidney disease), stage II   . Claustrophobia   . Temporal arteritis (HCC)   . Memory difficulty   . NSTEMI (non-ST elevated myocardial infarction) [facility](HCC)     June 2016 [facility], managed medically  . Chronic insomnia     Medications:  Infusions:  . sodium chloride 75 mL/hr at 05/31/15 2000  . amiodarone    . amiodarone    . heparin 1,050 Units/hr (11/15/2014 0200)    Assessment: 69 yo with chest pain and elevated troponin on heparin drip. Heparin level is low this am 0.25 units/ml. Patient arrested this am at 0630, code blue called. STEMI on EKG. Pt lose pulses again and went from PEA to asystole. Pt made DNR and time of death this AM at 0657  Goal of Therapy:  Heparin level 0.3-0.7 units/ml   Plan:  No further tx  Elder CyphersLorie Olar Santini, BS Loura BackPharm D, BCPS Clinical Pharmacist Pager [phone] 05/28/2015,7:38 AM

## 2015-06-24 NOTE — Care Management Note (Signed)
Case Management Note  Patient Details  Name: Lanice ShirtsCarolyn W Rohde MRN: [id number] Date of Birth: 02/03/1946  Expected Discharge Date:  06/08/2015               Expected Discharge Plan:  Expired  In-House Referral:     Discharge planning Services     Post Acute Care Choice:    Choice offered to:     DME Arranged:    DME Agency:     HH Arranged:    HH Agency:     Status of Service:  Completed, signed off  Medicare Important Message Given:    Date Medicare IM Given:    Medicare IM give by:    Date Additional Medicare IM Given:    Additional Medicare Important Message give by:     If discussed at Long Length of Stay Meetings, dates discussed:    Additional Comments: Pt expired prior to assessment being completed.  Malcolm Metrohildress, Reda Citron Demske, RN 06/19/2015, 8:53 AM

## 2015-06-24 NOTE — Progress Notes (Signed)
Dr [name] MansMcGinnis D/W sister over phone , patient condition and  CPR in progression, she expressed her wishes to stop CPR as patient wouldn't want that as well she did not want to kept on  life support, she confirmed this when i spoke to her as well, Code blue was stopped . Huey Bienenstockawood Shenique Childers MD

## 2015-06-24 NOTE — ED Provider Notes (Addendum)
6:25 AM  Called to ICU for code blue.  Pt is a 69 y.o. female who was admitted on November 5 for community-acquired pneumonia and NSTEMI.  Seen by cardiology, Dr. Wyline MoodBranch.  Per PCP Dr. [name] MansMcGinnis plan was to treat pneumonia and follow patient's cardiac status prior to cath. Per nursing staff patient was doing well overnight. At 6:30 AM she began to become bradycardic and agonal respirations. When I arrived in the room patient has a heart rate in the 20s. She received 1 mg of atropine at 630am and CPR was started. She was unresponsive, respiratory rate less than 10, GCS 3. CBG 102.  Pt lost pulses completely.  Patient was given epinephrine 3, bicarbonate 1. She went from bradycardia to PEA. Pt was intubated using a 7.5 endotracheal tube. She had rhonchorous breath sounds bilaterally. She then had episodes of ventricular tachycardia with and without a pulse. She was given amiodarone 300mg  IV push.  She was defibrillated once with 200 J. We had return of spontaneous circulation at 6:45 AM. Still having intermittent episodes of V tach with a pulse.  Amiodarone drip started.  EKG at 6:46 AM showed STEMI in lateral leads.  She had some spontaneous respirations and some movement of her extremities.  Code STEMI activated by ED secretary.  Pt lost pulses again and was given another epinephrine, another bicarbonate. She went from PEA to asystole.  Pt's family was contacted by Dr. Randol KernElgergawy who asked us to stop resuscitative measures and make patient DO NOT RESUSCITATE. CPR, ventilation with BVM stopped and time of death called at 657am.  Dr. [name] MansMcGinnis to complete death certificate.   EKG Interpretation  Date/Time:  Saturday May 29 2015 06:57:00 EDT Ventricular Rate:  77 PR Interval:  184 QRS Duration: 89 QT Interval:  437 QTC Calculation: 495 R Axis:   78 Text Interpretation:  Sinus rhythm Borderline T abnormalities, anterior leads Borderline prolonged QT interval Baseline wander in lead(s) II Since last tracing  rate slower 22 Mar 2015 Confirmed by KNAPP  MD-I, IVA ([id number]) on 06/10/2015 7:05:49 AM         Cardiopulmonary Resuscitation (CPR) Procedure Note Directed/Performed by: Raelyn NumberWARD, Saarah Dewing N I personally directed ancillary staff and/or performed CPR in an effort to regain return of spontaneous circulation and to maintain cardiac, neuro and systemic perfusion.     INTUBATION Performed by: Raelyn NumberWARD, Kymora Sciara N  Required items: required blood products, implants, devices, and special equipment available Patient identity confirmed: provided demographic data and hospital-assigned identification number Time out: Immediately prior to procedure a "time out" was called to verify the correct patient, procedure, equipment, support staff and site/side marked as required.  Indications: Respiratory failure, cardiac arrest   Intubation method: Glidescope Laryngoscopy   Preoxygenation: BVM  Sedatives: none Paralytic: none  Tube Size: 7.5 cuffed  Post-procedure assessment: chest rise and ETCO2 monitor Breath sounds: equal and absent over the epigastrium Tube secured with: ETT holder  Chest x-ray not performed as patient expired.  Patient tolerated the procedure well with no immediate complications.   CRITICAL CARE Performed by: Raelyn NumberWARD, Shamaria Kavan N   Total critical care time: 30  minutes  Critical care time was exclusive of separately billable procedures and treating other patients.  Critical care was necessary to treat or prevent imminent or life-threatening deterioration.  Critical care was time spent personally by me on the following activities: development of treatment plan with patient and/or surrogate as well as nursing, discussions with consultants, evaluation of patient's response to treatment, examination of patient,  obtaining history from patient or surrogate, ordering and performing treatments and interventions, ordering and review of laboratory studies, ordering and review of radiographic  studies, pulse oximetry and re-evaluation of patient's condition.   Layla Maw Ashtan Girtman, DO 2015/06/10 [id number]  Layla Maw Silvie Obremski, DO 06/10/15 [id number]

## 2015-06-24 NOTE — Progress Notes (Signed)
eLink Physician-Brief Progress Note Patient Name: Kara ShirtsCarolyn [name] DOB: 11/05/1945 MRN: [id number]   Date of Service  06/21/2015  HPI/Events of Note  Patient with hypotension in the setting of elevated lactate and PNA.  Current BP of 80/53 (63)  eICU Interventions  Plan: 500 cc bolus of NS for BP support Continue to monitor patient     Intervention Category Intermediate Interventions: Hypotension - evaluation and management  Darnell Jeschke 06/20/2015, 2:18 AM

## 2015-06-24 NOTE — Progress Notes (Signed)
Alarm on central monitor alerted to decreasing heart rate. Immediately went in to room to assess pt Pt found to have agonal respirations, immediately called Code and started with BVM Atropine given as pt had brady rhythm with pulse Staff arrived and the following is available on Code Sheets and physician notes

## 2015-06-24 NOTE — Discharge Summary (Cosign Needed)
Physician Discharge Summary  CLOE SOCKWELL AVW:[id number] DOB: 06-30-1946 DOA: 2015/06/12  PCP: [name], MD  Admit date: 2015-06-12 Discharge date: 05/31/2015     Discharge Diagnoses:  1. Acute septal myocardial infarction with cardio pulmonary arrest 2. 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 Bronchopneumonia right and left lower lobe 4. CK D stage II 5. Diabetes mellitus non-insulin-dependent 6. Essential hypertension 7. COPD history chronic respiratory distress 8. Anemia of chronic disease 9. Temporal arteritis-history 10. Chronic back pain degenerative disc disease 11. Hypothyroidism 12. Paroxysmal atrial fibrillation 13.   Discharge Condition: Patient deceased Disposition: Deceased  Diet recommendation: None  Filed Weights   05/31/15 0500 06/19/2015 0500 05/27/2015 0705  Weight: 74.5 kg (164 lb 3.9 oz) 75.1 kg (165 lb 9.1 oz) 75.1 kg (165 lb 9.1 oz)    History of present illness:  The patient presented to the ED with respiratory symptoms dyspnea and cough. She has known chronic respiratory failure and is on nasal O2. She was found to have evidence of pneumonia and right left lower lobe. She is known to have other medical problems diabetes mellitus type 2, chronic diastolic and systolic congestive heart failure, hypertension, hypothyroidism, coronary artery disease and dementia. She was admitted to MedSurg floor  and started on appropriate antibiotic therapy  Hospital Course:  Examination on admission revealed alert and oriented patient HEENT negative with exception of decreased vision in left eye Heart regular rhythm lungs rhonchi over lower lung fields abdomen no palpable organs or masses extremities were free of edema The patient was started on IV fluids and IV antibiotic vancomycin and Zosyn. She was continued on amiodarone by mouth: Previous diagnosis paroxysmal atrial fibrillation. She is continued on Synthroid and neb treatments The  patient did complain of anterior chest pain EKG ordered patient's troponins increased to critical level. Patient was moved tointensive care by physician on call and was started on nitroglycerin drip and heparin drip   The patient was seen in consultation by cardiology. It was noted that EKG showed sinus tachycardia with T-wave inversions in anterior lateral leads. It was felt that she had experience NST EMI. Cardiology felt that she was not a candidate for cardiac catheterization and continued conservative treatment was recommended she was continued on amiodarone and magnesium was added as magnesium level is somewhat low. Patient became more comfortable   2-D echocardiogram ordered which showed LVEF 30% apical and anterior hypokinesis suggestive of possible LAD ischemic infarct. Cardiology noted that troponin had peaked and was trending down. It was felt by cardiology that patient should be continued on medical therapy and as respiratory status improves consider catheterization at that time.  In early morning hours 06/12/2015 the patient developed bradycardia with heart rates into the 30s. Code was called ED physicians responded. The patient was unresponsive. CPR was started. Patient was intubated she was shocked with 200 J with no response to epinephrine and atropine were given according to protocol. After proper 30 minutes patient was pronounced deceased and family was notified  s . Discharge Instructions The patient deceased therefore no instructions given    Medication List    ASK your doctor about these medications        acetaminophen 500 MG tablet  Commonly known as:  TYLENOL  Take 1,000 mg by mouth every 8 (eight) hours.     ALPRAZolam 1 MG tablet  Commonly known as:  XANAX  Take 1 mg by mouth 3 (three) times daily.     amiodarone 200 MG tablet  Commonly known as:  PACERONE  Take 1 tablet (200 mg total) by mouth daily.     aspirin EC 81 MG tablet  Take 81 mg by mouth every morning.     atorvastatin 80 MG tablet  Commonly known as:  LIPITOR  Take 1 tablet (80 mg total) by mouth daily.     clopidogrel 75 MG tablet  Commonly known as:  PLAVIX  Take 1 tablet (75 mg total) by mouth daily.     dexamethasone 0.5 MG tablet  Commonly known as:  DECADRON   Take 1 tablet (0.5 mg total) by mouth daily.     docusate sodium 100 MG capsule  Commonly known as:  COLACE  Take 2 capsules (200 mg total) by mouth daily.     donepezil 5 MG tablet  Commonly known as:  ARICEPT  Take 1 tablet (5 mg total) by mouth at bedtime.     levothyroxine 100 MCG tablet  Commonly known as:  SYNTHROID, LEVOTHROID  Take 100 mcg by mouth daily.     metFORMIN 1000 MG tablet  Commonly known as:  GLUCOPHAGE  Take 1,000 mg by mouth 2 (two) times daily with a meal.     metoprolol tartrate 25 MG tablet  Commonly known as:  LOPRESSOR  Take 12.5 mg by mouth daily.     morphine 30 MG tablet  Commonly known as:  MSIR  Take 30 mg by mouth 2 (two) times daily.     nitroGLYCERIN 0.4 MG SL tablet  Commonly known as:  NITROSTAT  Place 1 tablet (0.4 mg total) under the tongue every 5 (five) minutes as needed for chest pain.     ondansetron 4 MG tablet  Commonly known as:  ZOFRAN  Take 4 mg by mouth every 6 (six) hours as needed for nausea or vomiting.     pantoprazole 40 MG tablet  Commonly known as:  PROTONIX  TAKE ONE TABLET TWICE DAILY BEFORE MEALS     polyethylene glycol packet  Commonly known as:  MIRALAX / GLYCOLAX  Take 17 g by mouth daily as needed for mild constipation.     potassium chloride SA 20 MEQ tablet  Commonly known as:  K-DUR,KLOR-CON  Take 20 mEq by mouth 2 (two) times daily.     torsemide 20 MG tablet  Commonly known as:  DEMADEX  Take 1 tablet (20 mg total) by mouth 2 (two) times daily.     traZODone 100 MG tablet  Commonly known as:  DESYREL  Take 2 tablets (200 mg total) by mouth at bedtime. As directed.  If not effective may increase to  at night.       Allergies  Allergen Reactions  . Codeine Nausea And Vomiting and Rash    The results of significant diagnostics from this hospitalization (including imaging, microbiology, ancillary and laboratory) are listed below for reference.    Significant Diagnostic Studies: Dg  Chest 1 View  05/31/2015  CLINICAL DATA:  Followup pneumonia.  Dyspnea. EXAM:  CHEST 1 VIEW COMPARISON:  2015-05-07 FINDINGS: Patchy consolidation on the right has mildly increased, particularly in the right mid lung, now corresponding to the inferior aspect of the right upper lobe. The consolidation in the right lower and middle lobe is stable. Irregular interstitial type densities are noted at the left lung base along the bronchovascular structures, similar to the prior study allowing for differences in positioning. Left lung is hyperexpanded. Cardiac silhouette is mildly enlarged. No gross mediastinal or hilar masses. Small right minimal left pleural effusions noted on the prior study are without notable change. No pneumothorax. IMPRESSION: 1. Some mild worsening in lung aeration on the right since prior study with increased consolidation noted in the inferior right upper lobe persistent consolidation noted throughout the right middle and lower lobes. Electronically Signed   By: Amie Portlandavid  Ormond M.D.   On: 05/31/2015 13:24   Dg Chest 2 View  05/28/2015  CLINICAL DATA:  Bilateral rib pain. EXAM: CHEST  2 VIEW COMPARISON:  03/22/2015, 01/05/2015 and 09/01/2014 FINDINGS: The patient has a new extensive pulmonary infiltrate involving the right middle and lower lobes with consolidation. Heart size and pulmonary vascularity are normal. Left lung is clear although hyperinflated. Tiny new left effusion. No osseous abnormality. IMPRESSION: Pneumonia in the right middle and lower lobes.  COPD. Electronically Signed   By: Francene BoyersJames  Maxwell M.D.   On: 2015-05-07 08:57   Nm Pulmonary Perf And Vent  05/31/2015  CLINICAL DATA:  Patient admitted for right lung pneumonia on 2015-05-07. Patient's respiratory status has deteriorated. Complaining of chest pain. EXAM: NUCLEAR MEDICINE VENTILATION - PERFUSION LUNG SCAN TECHNIQUE: Ventilation images were obtained in multiple projections using inhaled aerosol Tc-1949m DTPA. Perfusion  images were obtained in multiple projections after intravenous injection of Tc-249m MAA. RADIOPHARMACEUTICALS:  30.0 millicuries Technetium-6049m DTPA aerosol inhalation and 4.0 millicuries of Technetium-3049m MAA IV COMPARISON:  Ventilation-perfusion study dated 03/23/2015. Current chest radiograph and prior chest radiographs 2015-05-07. FINDINGS: Ventilation: There is decreased ventilation throughout the right lower lung, involving the right lower lobe and right middle lobe. This is new from the prior ventilation-perfusion study. This corresponds to the consolidation noted in the right middle and lower lobes on the chest radiographs. Perfusion: There is decreased profusion throughout the right middle and much of the right lower lobe with some maintained right posterior lung profusion. Left lung profusion is normal. IMPRESSION: 1. Decreased ventilation and profusion, which matches the radiographic consolidation in the right lower lobe and right middle lobe. Since the defect is large involves the lower lung, this is interpreted as an intermediate probability study for pulmonary thromboembolism based on revised PIOPED criteria. Electronically Signed   By: Amie Portlandavid  Ormond M.D.   On: 05/31/2015 13:10    Microbiology: Recent Results (from the past 240 hour(s))  Blood culture (routine x 2)     Status: None (Preliminary result)   Collection Time: 07/04/2015 10:02 AM  Result Value Ref Range Status   Specimen Description BLOOD LEFT HAND  Final   Special Requests BOTTLES DRAWN AEROBIC AND ANAEROBIC 6CC  Final   Culture NO GROWTH 2 DAYS  Final   Report Status PENDING  Incomplete  Blood culture (routine x 2)     Status: None (Preliminary result)   Collection Time: 07/04/2015 10:10 AM  Result Value Ref Range Status   Specimen Description BLOOD RIGHT HAND  Final   Special Requests BOTTLES DRAWN AEROBIC AND ANAEROBIC 6CC  Final   Culture NO GROWTH 2 DAYS  Final   Report Status PENDING  Incomplete  Culture,  sputum-assessment     Status: None   Collection Time: 05/30/15  3:55 AM  Result Value Ref Range Status   Specimen Description SPUTUM  Final   Special Requests NONE  Final   Sputum evaluation   Final    MICROSCOPIC FINDINGS SUGGEST THAT THIS SPECIMEN IS NOT REPRESENTATIVE OF LOWER RESPIRATORY SECRETIONS. PLEASE RECOLLECT.   Report Status 05/30/2015 FINAL  Final  MRSA PCR Screening     Status: None   Collection Time: 05/30/15  1:11 PM  Result Value Ref Range Status   MRSA by PCR NEGATIVE NEGATIVE Final    Comment:        The GeneXpert MRSA Assay (FDA approved for NASAL specimens only), is one component of a comprehensive MRSA colonization surveillance program. It is not intended to diagnose MRSA infection nor to guide or monitor treatment for MRSA infections.      Labs: Basic Metabolic Panel:  Recent Labs Lab 06/13/2015 0744 05/30/15 0551 05/31/15 0304 05/31/15 0305  NA 140 143 141  --   K 5.0 4.5 4.4  --   CL 102 104 105  --   CO2 --   GLUCOSE 106* 107* 132*  --   BUN 35* 35* 30*  --   CREATININE 2.24* 1.94* 1.75*  --   CALCIUM 7.0* 6.7* 6.8*  --   MG  --   --   --  0.9*   Liver Function Tests:  Recent Labs Lab 06/05/2015 0744 05/31/15 0305  AST 12*  --   ALT 8*  --   ALKPHOS 73  --   BILITOT 0.7  --   PROT 4.7*  --   ALBUMIN 2.1* 1.9*   No results for input(s): LIPASE, AMYLASE in the last 168 hours. No results for input(s): AMMONIA in the last 168 hours. CBC:  Recent Labs Lab 05/31/2015 0744 05/30/15 0551 05/31/15 0304 06-11-15 0521  WBC 9.6 7.0 10.0 9.4  NEUTROABS 7.7  --  8.2* 8.4*  HGB 9.8* 7.9* 8.8* 8.7*  HCT 32.5* 26.3* 29.2* 29.6*  MCV 102.8* 104.0* 103.9* 105.3*  PLT 90* 89* 117* 152   Cardiac Enzymes:  Recent Labs Lab 05/30/15 1052 05/30/15 1849 05/31/15 0304 05/31/15 1108 05/31/15 1816 06/11/15 0258  CKTOTAL 32*  --   --   --   --   --   CKMB 4.2  --   --   --   --   --   TROPONINI 0.06* 3.94* 4.16* 3.84* 3.25* 2.62*    BNP: BNP (last 3 results)  Recent Labs  01/05/15 0432 03/22/15 1516 06/09/2015 0744  BNP 1473.0* 121.0* 370.0*    ProBNP (last 3 results) No results for input(s): PROBNP in the last 8760 hours.  CBG:  Recent Labs Lab 05/31/15 0723 05/31/15 1131 05/31/15 1643 05/31/15 2102 11-Jun-2015 0643  GLUCAP 114* 145* 123* 105* 102*    Active Problems:   COPD (chronic obstructive pulmonary disease) (HCC)   Essential hypertension, benign   Hypothyroidism   Chronic diastolic heart failure (HCC)   Anemia of chronic disease   GERD (gastroesophageal reflux disease)   Diabetes (HCC)   CAP (community acquired pneumonia)   Acute renal failure superimposed on stage 3 chronic kidney disease (HCC)   Chronic respiratory failure with hypoxia (HCC)   Thrombocytopenia (HCC)   HCAP (healthcare-associated pneumonia)   NSTEMI (non-ST elevated myocardial infarction) (HCC)   Dyspnea and respiratory abnormalities   Time coordinating discharge: 45 minutes  Signed:  Butch Penny, MD 06/17/2015, 4:17 PM

## 2015-06-24 NOTE — Progress Notes (Signed)
PT'S FAMILY HAS BEEN IN AND HAVE NOW GONE TO FUNERAL HOME. PT'S BELONGINGS GIVEN TO SISTER DEE LAQSON,WHICH SHE THROW IN TRASH. PT'S PASTOR, CLIFFORD BROWN NOTIFIED OF PT'S DEMISE AND [facility] HERE NOW AND HAS TAKEN PT'S REMAINS.

## 2015-06-24 DEATH — deceased

## 2016-01-29 IMAGING — NM NM PULMONARY VENT & PERF
16 series · 16 of 16 positions shown · non-contrast
Comparison: Chest x-ray 03/22/2015

CLINICAL DATA: Chest pain for 2 weeks, shortness of Breath

EXAM:
NUCLEAR MEDICINE VENTILATION - PERFUSION LUNG SCAN
TECHNIQUE: Ventilation images were obtained in multiple projections using
inhaled aerosol Fc-XXm DTPA. Perfusion images were obtained in
multiple projections after intravenous injection of Fc-XXm MAA.
RADIOPHARMACEUTICALS:  42 mCi Xechnetium-CCm DTPA aerosol inhalation
and 6.2 mCi Xechnetium-CCm MAA IV

[Series 1: ant/post vent · 4.14mm/px · 1 of 1 slices shown (1 of 2)]
[im 1/1]
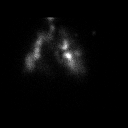

[Series 1: ant/post vent · 4.14mm/px · 1 of 1 slices shown (2 of 2)]
[im 1/1]
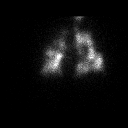

[Series 2: lao/rpo vent · 4.14mm/px · 1 of 1 slices shown (1 of 2)]
[im 1/1]
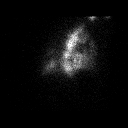

[Series 2: lao/rpo vent · 4.14mm/px · 1 of 1 slices shown (2 of 2)]
[im 1/1]
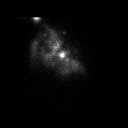

[Series 3: lt lat/rt lat vent · 4.14mm/px · 1 of 1 slices shown (1 of 2)]
[im 1/1]
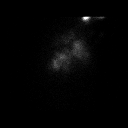

[Series 3: lt lat/rt lat vent · 4.14mm/px · 1 of 1 slices shown (2 of 2)]
[im 1/1]
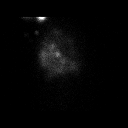

[Series 4: lpo/rao vent · 4.14mm/px · 1 of 1 slices shown (1 of 2)]
[im 1/1]
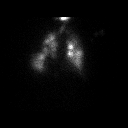

[Series 4: lpo/rao vent · 4.14mm/px · 1 of 1 slices shown (2 of 2)]
[im 1/1]
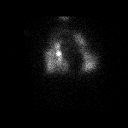

[Series 5: ant/post perf · 4.14mm/px · 1 of 1 slices shown (1 of 2)]
[im 1/1]
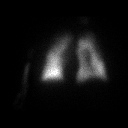

[Series 5: ant/post perf · 4.14mm/px · 1 of 1 slices shown (2 of 2)]
[im 1/1]
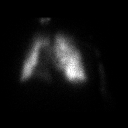

[Series 6: lao/rpo perf · 4.14mm/px · 1 of 1 slices shown (1 of 2)]
[im 1/1]
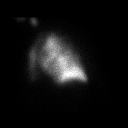

[Series 6: lao/rpo perf · 4.14mm/px · 1 of 1 slices shown (2 of 2)]
[im 1/1]
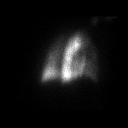

[Series 7: lt lat/rt lat perf · 4.14mm/px · 1 of 1 slices shown (1 of 2)]
[im 1/1]
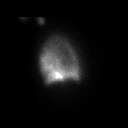

[Series 7: lt lat/rt lat perf · 4.14mm/px · 1 of 1 slices shown (2 of 2)]
[im 1/1]
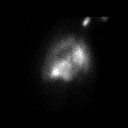

[Series 8: lpo/rao perf · 4.14mm/px · 1 of 1 slices shown (1 of 2)]
[im 1/1]
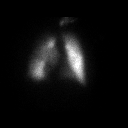

[Series 8: lpo/rao perf · 4.14mm/px · 1 of 1 slices shown (2 of 2)]
[im 1/1]
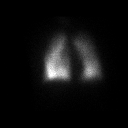

[16 of 16 positions shown; findings below may reference images not displayed]

FINDINGS: Ventilation: Patchy bilateral ventilation defects.

Perfusion: Patchy bilateral nonsegmental perfusion defects, less
pronounced than the ventilation defects. No wedge-shaped peripheral
defects.
IMPRESSION: Bilateral ventilation and perfusion nonsegmental defects, more
pronounced on the ventilation portion of the study. Findings most
likely reflective of obstructive lung disease. Findings low
probability for pulmonary embolus.
